# Patient Record
Sex: Female | Born: 1935
Health system: Southern US, Community
[De-identification: ages and names within clinical notes are randomized; demographics above are authoritative.]

## PROBLEM LIST (undated history)

## (undated) DIAGNOSIS — K6289 Other specified diseases of anus and rectum: Secondary | ICD-10-CM

## (undated) DIAGNOSIS — K5730 Diverticulosis of large intestine without perforation or abscess without bleeding: Secondary | ICD-10-CM

## (undated) DIAGNOSIS — Z8781 Personal history of (healed) traumatic fracture: Secondary | ICD-10-CM

## (undated) DIAGNOSIS — D649 Anemia, unspecified: Secondary | ICD-10-CM

## (undated) DIAGNOSIS — I5032 Chronic diastolic (congestive) heart failure: Secondary | ICD-10-CM

## (undated) DIAGNOSIS — M8580 Other specified disorders of bone density and structure, unspecified site: Secondary | ICD-10-CM

## (undated) DIAGNOSIS — I447 Left bundle-branch block, unspecified: Secondary | ICD-10-CM

## (undated) DIAGNOSIS — I259 Chronic ischemic heart disease, unspecified: Secondary | ICD-10-CM

## (undated) DIAGNOSIS — I10 Essential (primary) hypertension: Secondary | ICD-10-CM

## (undated) DIAGNOSIS — K5901 Slow transit constipation: Secondary | ICD-10-CM

## (undated) DIAGNOSIS — K648 Other hemorrhoids: Secondary | ICD-10-CM

## (undated) DIAGNOSIS — I5020 Unspecified systolic (congestive) heart failure: Secondary | ICD-10-CM

## (undated) DIAGNOSIS — E8809 Other disorders of plasma-protein metabolism, not elsewhere classified: Secondary | ICD-10-CM

## (undated) DIAGNOSIS — I2510 Atherosclerotic heart disease of native coronary artery without angina pectoris: Secondary | ICD-10-CM

## (undated) DIAGNOSIS — N289 Disorder of kidney and ureter, unspecified: Secondary | ICD-10-CM

## (undated) DIAGNOSIS — F32A Depression, unspecified: Secondary | ICD-10-CM

## (undated) DIAGNOSIS — F329 Major depressive disorder, single episode, unspecified: Secondary | ICD-10-CM

## (undated) DIAGNOSIS — M1990 Unspecified osteoarthritis, unspecified site: Secondary | ICD-10-CM

## (undated) HISTORY — PX: ABDOMINAL HYSTERECTOMY: SHX81

## (undated) HISTORY — DX: Atherosclerotic heart disease of native coronary artery without angina pectoris: I25.10

## (undated) HISTORY — DX: Other specified disorders of bone density and structure, unspecified site: M85.80

## (undated) HISTORY — DX: Anemia, unspecified: D64.9

## (undated) HISTORY — DX: Unspecified systolic (congestive) heart failure: I50.20

## (undated) HISTORY — DX: Personal history of (healed) traumatic fracture: Z87.81

## (undated) HISTORY — PX: CATARACT EXTRACTION, BILATERAL: SHX1313

## (undated) HISTORY — DX: Slow transit constipation: K59.01

## (undated) HISTORY — DX: Other specified diseases of anus and rectum: K62.89

## (undated) HISTORY — DX: Disorder of kidney and ureter, unspecified: N28.9

## (undated) HISTORY — DX: Chronic diastolic (congestive) heart failure: I50.32

## (undated) HISTORY — DX: Left bundle-branch block, unspecified: I44.7

## (undated) HISTORY — DX: Other hemorrhoids: K64.8

## (undated) HISTORY — DX: Chronic ischemic heart disease, unspecified: I25.9

## (undated) HISTORY — DX: Other disorders of plasma-protein metabolism, not elsewhere classified: E88.09

## (undated) HISTORY — DX: Diverticulosis of large intestine without perforation or abscess without bleeding: K57.30

## (undated) HISTORY — PX: TONSILLECTOMY: SUR1361

## (undated) HISTORY — PX: CARDIAC CATHETERIZATION: SHX172

---

## 1998-05-06 ENCOUNTER — Encounter: Payer: Self-pay | Admitting: *Deleted

## 1998-05-06 ENCOUNTER — Observation Stay (HOSPITAL_COMMUNITY): Admission: EM | Admit: 1998-05-06 | Discharge: 1998-05-07 | Payer: Self-pay | Admitting: *Deleted

## 1998-07-27 ENCOUNTER — Observation Stay (HOSPITAL_COMMUNITY): Admission: RE | Admit: 1998-07-27 | Discharge: 1998-07-28 | Payer: Self-pay | Admitting: Obstetrics & Gynecology

## 1999-05-26 ENCOUNTER — Emergency Department (HOSPITAL_COMMUNITY): Admission: EM | Admit: 1999-05-26 | Discharge: 1999-05-26 | Payer: Self-pay | Admitting: Emergency Medicine

## 1999-09-16 ENCOUNTER — Other Ambulatory Visit: Admission: RE | Admit: 1999-09-16 | Discharge: 1999-09-16 | Payer: Self-pay | Admitting: Obstetrics & Gynecology

## 2000-07-01 ENCOUNTER — Ambulatory Visit (HOSPITAL_COMMUNITY): Admission: RE | Admit: 2000-07-01 | Discharge: 2000-07-01 | Payer: Self-pay | Admitting: *Deleted

## 2000-10-29 ENCOUNTER — Emergency Department (HOSPITAL_COMMUNITY): Admission: EM | Admit: 2000-10-29 | Discharge: 2000-10-29 | Payer: Self-pay | Admitting: Emergency Medicine

## 2000-10-30 ENCOUNTER — Emergency Department (HOSPITAL_COMMUNITY): Admission: EM | Admit: 2000-10-30 | Discharge: 2000-10-30 | Payer: Self-pay | Admitting: Emergency Medicine

## 2001-02-13 ENCOUNTER — Emergency Department (HOSPITAL_COMMUNITY): Admission: EM | Admit: 2001-02-13 | Discharge: 2001-02-13 | Payer: Self-pay | Admitting: Emergency Medicine

## 2001-02-13 ENCOUNTER — Encounter: Payer: Self-pay | Admitting: Emergency Medicine

## 2001-03-29 ENCOUNTER — Encounter: Payer: Self-pay | Admitting: Gastroenterology

## 2001-12-28 ENCOUNTER — Other Ambulatory Visit: Admission: RE | Admit: 2001-12-28 | Discharge: 2001-12-28 | Payer: Self-pay | Admitting: Obstetrics & Gynecology

## 2003-08-06 ENCOUNTER — Emergency Department (HOSPITAL_COMMUNITY): Admission: EM | Admit: 2003-08-06 | Discharge: 2003-08-06 | Payer: Self-pay | Admitting: Emergency Medicine

## 2004-01-30 ENCOUNTER — Other Ambulatory Visit: Admission: RE | Admit: 2004-01-30 | Discharge: 2004-01-30 | Payer: Self-pay | Admitting: Obstetrics & Gynecology

## 2004-03-12 ENCOUNTER — Emergency Department (HOSPITAL_COMMUNITY): Admission: EM | Admit: 2004-03-12 | Discharge: 2004-03-12 | Payer: Self-pay | Admitting: Emergency Medicine

## 2004-05-29 ENCOUNTER — Ambulatory Visit: Payer: Self-pay | Admitting: Internal Medicine

## 2004-07-30 ENCOUNTER — Ambulatory Visit: Payer: Self-pay | Admitting: Internal Medicine

## 2004-08-26 ENCOUNTER — Ambulatory Visit: Payer: Self-pay | Admitting: Internal Medicine

## 2004-09-10 ENCOUNTER — Emergency Department (HOSPITAL_COMMUNITY): Admission: EM | Admit: 2004-09-10 | Discharge: 2004-09-10 | Payer: Self-pay | Admitting: Emergency Medicine

## 2004-09-16 ENCOUNTER — Ambulatory Visit: Payer: Self-pay | Admitting: Internal Medicine

## 2004-10-08 ENCOUNTER — Ambulatory Visit: Payer: Self-pay | Admitting: Internal Medicine

## 2004-10-10 ENCOUNTER — Ambulatory Visit (HOSPITAL_COMMUNITY): Admission: RE | Admit: 2004-10-10 | Discharge: 2004-10-10 | Payer: Self-pay | Admitting: Internal Medicine

## 2004-11-04 ENCOUNTER — Ambulatory Visit: Payer: Self-pay | Admitting: Internal Medicine

## 2005-02-04 ENCOUNTER — Ambulatory Visit: Payer: Self-pay | Admitting: Internal Medicine

## 2005-06-03 ENCOUNTER — Ambulatory Visit: Payer: Self-pay | Admitting: Internal Medicine

## 2005-08-05 ENCOUNTER — Ambulatory Visit: Payer: Self-pay | Admitting: Internal Medicine

## 2010-05-28 ENCOUNTER — Encounter: Payer: Self-pay | Admitting: Gastroenterology

## 2010-05-29 ENCOUNTER — Encounter (INDEPENDENT_AMBULATORY_CARE_PROVIDER_SITE_OTHER): Payer: Self-pay | Admitting: *Deleted

## 2010-05-29 ENCOUNTER — Encounter: Admission: RE | Admit: 2010-05-29 | Discharge: 2010-05-29 | Payer: Self-pay | Admitting: Internal Medicine

## 2010-06-04 ENCOUNTER — Encounter: Payer: Self-pay | Admitting: Gastroenterology

## 2010-06-20 DIAGNOSIS — R809 Proteinuria, unspecified: Secondary | ICD-10-CM | POA: Insufficient documentation

## 2010-06-20 DIAGNOSIS — M949 Disorder of cartilage, unspecified: Secondary | ICD-10-CM

## 2010-06-20 DIAGNOSIS — R109 Unspecified abdominal pain: Secondary | ICD-10-CM | POA: Insufficient documentation

## 2010-06-20 DIAGNOSIS — H409 Unspecified glaucoma: Secondary | ICD-10-CM | POA: Insufficient documentation

## 2010-06-20 DIAGNOSIS — M899 Disorder of bone, unspecified: Secondary | ICD-10-CM | POA: Insufficient documentation

## 2010-06-20 DIAGNOSIS — J329 Chronic sinusitis, unspecified: Secondary | ICD-10-CM | POA: Insufficient documentation

## 2010-06-20 DIAGNOSIS — F411 Generalized anxiety disorder: Secondary | ICD-10-CM | POA: Insufficient documentation

## 2010-06-20 DIAGNOSIS — I10 Essential (primary) hypertension: Secondary | ICD-10-CM | POA: Insufficient documentation

## 2010-06-20 DIAGNOSIS — K219 Gastro-esophageal reflux disease without esophagitis: Secondary | ICD-10-CM | POA: Insufficient documentation

## 2010-06-20 DIAGNOSIS — H269 Unspecified cataract: Secondary | ICD-10-CM | POA: Insufficient documentation

## 2010-06-20 DIAGNOSIS — E559 Vitamin D deficiency, unspecified: Secondary | ICD-10-CM | POA: Insufficient documentation

## 2010-06-21 ENCOUNTER — Ambulatory Visit: Payer: Self-pay | Admitting: Gastroenterology

## 2010-06-21 DIAGNOSIS — K5901 Slow transit constipation: Secondary | ICD-10-CM | POA: Insufficient documentation

## 2010-06-21 DIAGNOSIS — K625 Hemorrhage of anus and rectum: Secondary | ICD-10-CM

## 2010-06-21 DIAGNOSIS — K5900 Constipation, unspecified: Secondary | ICD-10-CM | POA: Insufficient documentation

## 2010-06-24 ENCOUNTER — Ambulatory Visit: Payer: Self-pay | Admitting: Cardiovascular Disease

## 2010-06-24 ENCOUNTER — Encounter (INDEPENDENT_AMBULATORY_CARE_PROVIDER_SITE_OTHER): Payer: Self-pay | Admitting: *Deleted

## 2010-06-24 LAB — CONVERTED CEMR LAB
ALT: 19 U/L
AST: 26 U/L
Albumin: 3.8 g/dL
Alkaline Phosphatase: 44 U/L
Amylase: 92 U/L
BUN: 16 mg/dL
Basophils Absolute: 0.1 K/uL
Basophils Relative: 0.9 %
Bilirubin, Direct: 0 mg/dL
CO2: 31 meq/L
CRP, High Sensitivity: 6.55 — ABNORMAL HIGH
Calcium: 9.1 mg/dL
Chloride: 101 meq/L
Creatinine, Ser: 0.9 mg/dL
Eosinophils Absolute: 0.2 K/uL
Eosinophils Relative: 2.4 %
Ferritin: 43.6 ng/mL
Folate: 12.5 ng/mL
GFR calc non Af Amer: 67.42 mL/min
Glucose, Bld: 100 mg/dL — ABNORMAL HIGH
HCT: 37.8 %
Hemoglobin: 12.9 g/dL
Iron: 115 ug/dL
Lipase: 34 U/L
Lymphocytes Relative: 29.5 %
Lymphs Abs: 2.1 K/uL
MCHC: 34.2 g/dL
MCV: 89.7 fL
Monocytes Absolute: 0.5 K/uL
Monocytes Relative: 7.4 %
Neutro Abs: 4.2 K/uL
Neutrophils Relative %: 59.8 %
Platelets: 280 K/uL
Potassium: 3.9 meq/L
RBC: 4.21 M/uL
RDW: 12.5 %
Saturation Ratios: 22.5 %
Sed Rate: 19 mm/h
Sodium: 140 meq/L
TSH: 2.46 u[IU]/mL
Total Bilirubin: 0.7 mg/dL
Total Protein: 6.6 g/dL
Transferrin: 365.6 mg/dL — ABNORMAL HIGH
Vitamin B-12: 530 pg/mL
WBC: 7.1 10*3/microliter

## 2010-06-25 ENCOUNTER — Ambulatory Visit: Payer: Self-pay | Admitting: Gastroenterology

## 2010-06-25 LAB — CONVERTED CEMR LAB
Bilirubin Urine: NEGATIVE
Hemoglobin, Urine: NEGATIVE
Ketones, ur: NEGATIVE mg/dL
Total Protein, Urine: NEGATIVE mg/dL
Urine Glucose: NEGATIVE mg/dL
Urobilinogen, UA: 0.2 (ref 0.0–1.0)

## 2010-06-26 ENCOUNTER — Ambulatory Visit: Payer: Self-pay | Admitting: Gastroenterology

## 2010-06-26 DIAGNOSIS — K2990 Gastroduodenitis, unspecified, without bleeding: Secondary | ICD-10-CM

## 2010-06-26 DIAGNOSIS — K2970 Gastritis, unspecified, without bleeding: Secondary | ICD-10-CM | POA: Insufficient documentation

## 2010-06-26 LAB — CONVERTED CEMR LAB: UREASE: NEGATIVE

## 2010-07-01 ENCOUNTER — Encounter: Payer: Self-pay | Admitting: Gastroenterology

## 2010-07-09 ENCOUNTER — Ambulatory Visit: Payer: Self-pay | Admitting: Gastroenterology

## 2010-08-22 NOTE — Procedures (Signed)
Summary: Colonoscopy  Patient: Patricia Garcia Note: All result statuses are Final unless otherwise noted.  Tests: (1) Colonoscopy (COL)   COL Colonoscopy           DONE     [facility]     [address]     [place], [place]  [address]           COLONOSCOPY PROCEDURE REPORT           PATIENT:  Patricia Garcia, Patricia Garcia  MR#:  [id number]     BIRTHDATE:  11-Dec-1932, 77 yrs. old  GENDER:  female     ENDOSCOPIST:  Vania Rea. Jarold Motto, MD, [facility]     REF. BY:  Guerry Bruin, M.D.     PROCEDURE DATE:  06/26/2010     PROCEDURE:  Average-risk screening colonoscopy     G0121     ASA CLASS:  Class II     INDICATIONS:  Routine Risk Screening, Abdominal pain     MEDICATIONS:   Fentanyl 50 mcg IV, Versed 5 mg IV           DESCRIPTION OF PROCEDURE:   After the risks benefits and     alternatives of the procedure were thoroughly explained, informed     consent was obtained.  Digital rectal exam was performed and     revealed no abnormalities.   The LB 180AL [id number] endoscope was     introduced through the anus and advanced to the cecum, which was     identified by both the appendix and ileocecal valve, without     limitations.  The quality of the prep was excellent, using     MoviPrep.  The instrument was then slowly withdrawn as the colon     was fully examined.     <<PROCEDUREIMAGES>>           FINDINGS:  Moderate diverticulosis was found in the sigmoid to     descending colon segments.  No polyps or cancers were seen.     Retroflexed views in the rectum revealed no abnormalities.    The     scope was then withdrawn from the patient and the procedure     completed.           COMPLICATIONS:  None     ENDOSCOPIC IMPRESSION:     1) Moderate diverticulosis in the sigmoid to descending colon     segments     2) No polyps or cancers     probable adhesions.     RECOMMENDATIONS:     1) high fiber diet     2) Continue current colorectal screening recommendations for     "routine risk"  patients with a repeat colonoscopy in 10 years.     3) metamucil or benefiber     AMITIZA 8 MCGH BID.     REPEAT EXAM:  No           ______________________________     Vania Rea. Jarold Motto, MD, [facility]           CC:           n.     eSIGNED:   Vania Rea. Patterson at 06/26/2010 09:37 AM           Mechele Dawley, [id number]  Note: An exclamation mark (!) indicates a result that was not dispersed into the flowsheet. Document Creation Date: 06/26/2010 9:38 AM _______________________________________________________________________  (1) Order result status: Final Collection or observation date-time: 06/26/2010 09:21 Requested  date-time:  Receipt date-time:  Reported date-time:  Referring Physician:   Ordering Physician: Sheryn Bison [phone]) Specimen Source:  Source: Launa Grill Order Number: [id number] Lab site:   Appended Document: Colonoscopy    Clinical Lists Changes  Observations: Added new observation of COLONNXTDUE: 06/2020 (06/26/2010 13:36)      Appended Document: Colonoscopy     Procedures Next Due Date:    Colonoscopy: 06/2020

## 2010-08-22 NOTE — Miscellaneous (Signed)
Summary: [facility] previsit  Clinical Lists Changes  Medications: Added new medication of MOVIPREP 100 GM  SOLR (PEG-KCL-NACL-NASULF-NA ASC-C) As per prep instructions. - Signed Rx of MOVIPREP 100 GM  SOLR (PEG-KCL-NACL-NASULF-NA ASC-C) As per prep instructions.;  #1 x 0;  Signed;  Entered by: Karl Bales RN;  Authorized by: Mardella Layman MD [employer];  Method used: Electronically to [facility] [address], [place], [place]  [address], Ph: [phone], Fax: [phone] Observations: Added new observation of ALLERGY REV: Done (06/25/2010 12:54)    Prescriptions: MOVIPREP 100 GM  SOLR (PEG-KCL-NACL-NASULF-NA ASC-C) As per prep instructions.  #1 x 0   Entered by:   Karl Bales RN   Authorized by:   Mardella Layman MD [facility]   Signed by:   Karl Bales RN on 06/25/2010   Method used:   Electronically to        [facility]* (retail)       [address]       [place], [place]  [id number]       Ph: [phone]       Fax: [phone]   RxID:   [id number]   Appended Document: Colonoscopy    Clinical Lists Changes  Observations: Added new observation of COLONNXTDUE: 06/2020 (06/26/2010 13:36)

## 2010-08-22 NOTE — Procedures (Signed)
Summary: Colonoscopy   Colonoscopy  Procedure date:  03/29/2001  Findings:      Location:  [facility].   Patient Name: Patricia Garcia, Patricia Garcia MRN:  Procedure Procedures: Colonoscopy CPT: [address].  Personnel: Endoscopist: Vania Rea. Jarold Motto, MD.  Referred By: Sandrea Hughs, MD.  Exam Location: Exam performed in Outpatient Clinic. Outpatient  Patient Consent: Procedure, Alternatives, Risks and Benefits discussed, consent obtained, from patient.  Indications Symptoms: Hematochezia.  Surveillance of: Adenomatous Polyp(s).  History  Pre-Exam Physical: Performed Mar 29, 2001. Cardio-pulmonary exam, Rectal exam, Abdominal exam, Extremity exam, Mental status exam WNL.  Exam Exam: Extent of exam reached: Cecum, extent intended: Cecum.  The cecum was identified by appendiceal orifice and IC valve. Patient position: on left side. Duration of exam: 15 minutes. Colon retroflexion performed. Images taken. ASA Classification: II. Tolerance: excellent.  Monitoring: Pulse and BP monitoring, Oximetry used. Supplemental O2 given.  Colon Prep Used Golytely for colon prep. Prep results: excellent.  Sedation Meds: Patient assessed and found to be appropriate for moderate (conscious) sedation. Fentanyl 50 mcg. Versed 5 mg.  Instrument(s): CF 140L. Serial [id number]. Serial #0.  Findings - DIVERTICULOSIS: Descending Colon to Sigmoid Colon. Not bleeding. ICD9: Diverticulosis, Colon: 562.10.  - HEMORRHOIDS: Internal. Size: Small. Not bleeding. Not thrombosed. ICD9: Hemorrhoids, Internal: 455.0.   Assessment  Diagnoses: 562.10: Diverticulosis, Colon.  455.0: Hemorrhoids, Internal.   Comments: No polyps noted. Events  Unplanned Interventions: No intervention was required.  Plans Medication Plan: Continue current medications. Fiber supplements: Methylcellulose 1 tsp QAM, starting Mar 29, 2001   Patient Education: Patient given standard instructions for: High fiber diet.   Disposition: After procedure patient sent to recovery. After recovery patient sent home.  Scheduling/Referral: Follow-Up prn.    CC: Sandrea Hughs, MD  This report was created from the original endoscopy report, which was reviewed and signed by the above listed endoscopist.

## 2010-08-22 NOTE — Procedures (Signed)
Summary: Upper Endoscopy  Patient: Patricia Garcia Note: All result statuses are Final unless otherwise noted.  Tests: (1) Upper Endoscopy (EGD)   EGD Upper Endoscopy       DONE     [facility]     [address]     [place], [place]  [address]           ENDOSCOPY PROCEDURE REPORT           PATIENT:  Patricia Garcia, Patricia Garcia  MR#:  [id number]     BIRTHDATE:  10-20-1932, 77 yrs. old  GENDER:  female           ENDOSCOPIST:  Vania Rea. Jarold Motto, MD, [facility]     Referred by:  Guerry Bruin, M.D.           PROCEDURE DATE:  06/26/2010     PROCEDURE:  EGD with biopsy, [address]     ASA CLASS:  Class II     INDICATIONS:  GERD, abdominal pain           MEDICATIONS:   There was residual sedation effect present from     prior procedure., Fentanyl 25 mcg IV, Versed 2 mg IV     TOPICAL ANESTHETIC:  Exactacain Spray           DESCRIPTION OF PROCEDURE:   After the risks benefits and     alternatives of the procedure were thoroughly explained, informed     consent was obtained.  The [name] [id number] endoscope was     introduced through the mouth and advanced to the second portion of     the duodenum, without limitations.  The instrument was slowly     withdrawn as the mucosa was fully examined.     <<PROCEDUREIMAGES>>           Moderate gastritis was found. EROSIONS AND GRANULAR MUCOSA.CLO BX.     DONE.  irregular Z-line. BIOPSIES DONE.  The duodenal bulb was     normal in appearance, as was the postbulbar duodenum.  The stomach     was entered and closely examined. The antrum, angularis, and     lesser curvature were well visualized, including a retroflexed     view of the cardia and fundus. The stomach wall was normally     distensable. The scope passed easily through the pylorus into the     duodenum.    Retroflexed views revealed no abnormalities.    The     scope was then withdrawn from the patient and the procedure     completed.           COMPLICATIONS:  None           ENDOSCOPIC  IMPRESSION:     1) Moderate gastritis     2) Irregular Z-line     3) Normal duodenum     4) Normal stomach     1.R/O H.PYLORI     2.R/O BARRETT'S MUCOSA     3.CHRONIC GERD ON RX.     RECOMMENDATIONS:     1) Await biopsy results     2) Rx CLO if positive     3) continue PPI           REPEAT EXAM:  No           ______________________________     Vania Rea. Jarold Motto, MD, [facility]           CC:  n.     eSIGNED:   Vania Rea. Patterson at 06/26/2010 09:45 AM           Mechele Dawley, [id number]  Note: An exclamation mark (!) indicates a result that was not dispersed into the flowsheet. Document Creation Date: 06/26/2010 9:45 AM _______________________________________________________________________  (1) Order result status: Final Collection or observation date-time: 06/26/2010 09:32 Requested date-time:  Receipt date-time:  Reported date-time:  Referring Physician:   Ordering Physician: Sheryn Bison [phone]) Specimen Source:  Source: Launa Grill Order Number: [id number] Lab site:

## 2010-08-22 NOTE — Letter (Signed)
Summary: [facility] Instructions  [facility]  [address] [place], [place] [address]   Phone: [phone]  Fax: [phone]       Patricia Garcia    [birth year]    MRN: [id number]        Procedure Day Patricia Garcia: Wednesday 06-26-10     Arrival Time: 8:30 a.m.     Procedure Time: 9:30 a.m.     Location of Procedure:                    _x _  [facility] (4th Floor)  PREPARATION FOR COLONOSCOPY WITH MOVIPREP   Starting 5 days prior to your procedure  Today do not eat nuts, seeds, popcorn, corn, beans, peas,  salads, or any raw vegetables.  Do not take any fiber supplements (e.g. Metamucil, Citrucel, and Benefiber).  THE DAY BEFORE YOUR PROCEDURE         DATE:  06-25-10  DAY:  Tuesday   1.  Drink clear liquids the entire day-NO SOLID FOOD  2.  Do not drink anything colored red or purple.  Avoid juices with pulp.  No orange juice.  3.  Drink at least 64 oz. (8 glasses) of fluid/clear liquids during the day to prevent dehydration and help the prep work efficiently.  CLEAR LIQUIDS INCLUDE: Water Jello Ice Popsicles Tea (sugar ok, no milk/cream) Powdered fruit flavored drinks Coffee (sugar ok, no milk/cream) Gatorade Juice: apple, white grape, white cranberry  Lemonade Clear bullion, consomm, broth Carbonated beverages (any kind) Strained chicken noodle soup Hard Candy                             4.  In the morning, mix first dose of MoviPrep solution:    Empty 1 Pouch A and 1 Pouch B into the disposable container    Add lukewarm drinking water to the top line of the container. Mix to dissolve    Refrigerate (mixed solution should be used within 24 hrs)  5.  Begin drinking the prep at 5:00 p.m. The MoviPrep container is divided by 4 marks.   Every 15 minutes drink the solution down to the next mark (approximately 8 oz) until the full liter is complete.   6.  Follow completed prep with 16 oz of clear liquid of your choice (Nothing red or  purple).  Continue to drink clear liquids until bedtime.  7.  Before going to bed, mix second dose of MoviPrep solution:    Empty 1 Pouch A and 1 Pouch B into the disposable container    Add lukewarm drinking water to the top line of the container. Mix to dissolve    Refrigerate  THE DAY OF YOUR PROCEDURE      DATE:  06-26-10  DAY:  Wednesday  Beginning at  4:30 a.m. (5 hours before procedure):         1. Every 15 minutes, drink the solution down to the next mark (approx 8 oz) until the full liter is complete.  2. Follow completed prep with 16 oz. of clear liquid of your choice.    3. You may drink clear liquids until  7:30 a.m.  (2 HOURS BEFORE PROCEDURE).   MEDICATION INSTRUCTIONS  Unless otherwise instructed, you should take regular prescription medications with a small sip of water   as early as possible the morning of your procedure.  HOLD MAXZIDE before coming in for procedure tomorrow  OTHER INSTRUCTIONS  You will need a responsible adult at least 75 years of age to accompany you and drive you home.   This person must remain in the waiting room during your procedure.  Wear loose fitting clothing that is easily removed.  Leave jewelry and other valuables at home.  However, you may wish to bring a book to read or  an iPod/MP3 player to listen to music as you wait for your procedure to start.  Remove all body piercing jewelry and leave at home.  Total time from sign-in until discharge is approximately 2-3 hours.  You should go home directly after your procedure and rest.  You can resume normal activities the  day after your procedure.  The day of your procedure you should not:   Drive   Make legal decisions   Operate machinery   Drink alcohol   Return to work  You will receive specific instructions about eating, activities and medications before you leave.    The above instructions have been reviewed and explained to me by   Karl Bales  RN  June 25, 2010 1:31 PM    I fully understand and can verbalize these instructions _____________________________ Date _________

## 2010-08-22 NOTE — Assessment & Plan Note (Signed)
Summary: F/U FROM COLON.Patricia Garcia.    History of Present Illness Visit Type: Follow-up Visit Primary GI MD: Sheryn Bison MD FACP FAGA Primary Provider: Guerry Bruin, MD Requesting Provider: n/a Chief Complaint: Colonoscopy/Endoscopy follow up, pt states she has done good since her procedures History of Present Illness:   Patricia Garcia is currently asymptomatic on PPI therapy and fiber supplements with p.r.n. tramadol use. Her endoscopy showed NSAID-induced gastritis with negative H. pylori examination. Colonoscopy showed diverticulosis coli without other abnormalities. Review of her CT scan labs also were unremarkable.  She occasionally has right lower quadrant pain related to previous lysis of adhesions from pelvic inflammatory disease involving her colon. She was to restart her Aleve which she takes for degenerative arthritis. She is on calcium replacement and Metamucil capsules.   GI Review of Systems      Denies abdominal pain, acid reflux, belching, bloating, chest pain, dysphagia with liquids, dysphagia with solids, heartburn, loss of appetite, nausea, vomiting, vomiting blood, weight loss, and  weight gain.        Denies anal fissure, black tarry stools, change in bowel habit, constipation, diarrhea, diverticulosis, fecal incontinence, heme positive stool, hemorrhoids, irritable bowel syndrome, jaundice, light color stool, liver problems, rectal bleeding, and  rectal pain.    Current Medications (verified): 1)  Catapres 0.1 Mg Tabs (Clonidine Hcl) .... Take One By Mouth Three Times A Day 2)  Maxzide-25 37.5-25 Mg Tabs (Triamterene-Hctz) .... Take One By Mouth Once Daily 3)  Alprazolam 0.25 Mg Tabs (Alprazolam) .... Take 1/2 Tablet By Mouth At Bedtime or As Needed 4)  Premarin 0.625 Mg Tabs (Estrogens Conjugated) .... Take One By Mouth Once Daily 5)  Aspirin 81 Mg Tabs (Aspirin) .... Take 1 Tablet By Mouth Once A Day 6)  Vitamin E 400 Unit Caps (Vitamin E) .... Take One By Mouth Once  Daily 7)  Centrum Silver Ultra Womens  Tabs (Multiple Vitamins-Minerals) .... Take 1 Tablet By Mouth Once A Day 8)  Latanoprost 0.005 % Soln (Latanoprost) .... One Drop in Each Eye At Bedtime 9)  Flonase 50 Mcg/act Susp (Fluticasone Propionate) .... One Spray Each Nostril Two Times A Day As Needed 10)  Oscal 500/200 D-3 500-200 Mg-Unit Tabs (Calcium Carbonate-Vitamin D) .... Take One By Mouth Once Daily 11)  Vitamin D3 1000 Unit Tabs (Cholecalciferol) .... Take One  By Mouth Once Daily 12)  Fish Oil 1000 Mg Caps (Omega-3 Fatty Acids) .... Take One By Mouth Once Daily 13)  Dulcolax 5 Mg Tbec (Bisacodyl) .... Take 1 Tab By Mouth At Bedtime As Needed 14)  Aleve 220 Mg Tabs (Naproxen Sodium) .... Take 1 Tablet By Mouth Once A Day For Inflammation As Needed 15)  Tramadol Hcl 50 Mg Tabs (Tramadol Hcl) .... Take One By Mouth Every 6-8 Hours As Needed 16)  Metamucil 0.52 Gm Caps (Psyllium) .... 2 By Mouth Once Daily  Allergies (verified): 1)  Pcn 2)  Sulfa  Past History:  Family History: Last updated: 06/21/2010 Family History of Colon Cancer: Father, dx'd at 58yo Family History of Diabetes: Mother (deceased at 75yo) Family History of Heart Disease: Mother Family History of Kidney Disease: Mother  Social History: Last updated: 06/21/2010 Widow, 1 boy, 1 girl Homemaker Illicit Drug Use - no Patient gets regular exercise. Patient is a former smoker.   Past medical, surgical, family and social histories (including risk factors) reviewed for relevance to current acute and chronic problems.  Past Medical History: Reviewed history from 06/20/2010 and no changes required. Current Problems:  HYPERTENSION,  BENIGN (ICD-401.1) ABDOMINAL PAIN (ICD-789.00) CATARACTS (ICD-366.9) GLAUCOMA (ICD-365.9) HORMONE REPLACEMENT THERAPY (ICD-V07.4) VITAMIN D DEFICIENCY (ICD-268.9) OSTEOPENIA (ICD-733.90) GERD (ICD-530.81) SINUSITIS, RECURRENT (ICD-473.9) MICROALBUMINURIA (ICD-791.0) ANXIETY  (ICD-300.00)  Past Surgical History: Reviewed history from 06/21/2010 and no changes required. Hysterectomy 1977 Right Oophorectomy 2001 Appendectomy  Family History: Reviewed history from 06/21/2010 and no changes required. Family History of Colon Cancer: Father, dx'd at 38yo Family History of Diabetes: Mother (deceased at 57yo) Family History of Heart Disease: Mother Family History of Kidney Disease: Mother  Social History: Reviewed history from 06/21/2010 and no changes required. Widow, 1 boy, 1 girl Homemaker Illicit Drug Use - no Patient gets regular exercise. Patient is a former smoker.   Review of Systems  The patient denies allergy/sinus, anemia, anxiety-new, arthritis/joint pain, back pain, blood in urine, breast changes/lumps, change in vision, confusion, cough, coughing up blood, depression-new, fainting, fatigue, fever, headaches-new, hearing problems, heart murmur, heart rhythm changes, itching, menstrual pain, muscle pains/cramps, night sweats, nosebleeds, pregnancy symptoms, shortness of breath, skin rash, sleeping problems, sore throat, swelling of feet/legs, swollen lymph glands, thirst - excessive , urination - excessive , urination changes/pain, urine leakage, vision changes, and voice change.    Vital Signs:  Patient profile:   77 year old female Height:      63.5 inches Weight:      136 pounds BMI:     23.80 BSA:     1.65 Pulse rate:   80 / minute Pulse rhythm:   regular BP sitting:   132 / 76  (left arm)  Vitals Entered By: Merri Ray CMA Duncan Dull) (July 09, 2010 10:50 AM)  Physical Exam  General:  Well developed, well nourished, no acute distress.healthy appearing.   Head:  Normocephalic and atraumatic. Eyes:  PERRLA, no icterus.exam deferred to patient's ophthalmologist.   Abdomen:  Soft, nontender and nondistended. No masses, hepatosplenomegaly or hernias noted. Normal bowel sounds. Neurologic:  Alert and  oriented x4;  grossly normal  neurologically. Psych:  Alert and cooperative. Normal mood and affect.   Impression & Recommendations:  Problem # 1:  GASTRITIS (ICD-535.50) Assessment Improved Have Prescribed omeprazole 20 mg a day which I think she should take chronically per her NSAID use and recurrent gastritis. Gallbladder examinations have been negative.  Problem # 2:  CONSTIPATION (ICD-564.00) Assessment: Improved Continue high-fiber diet, Metamucil, and liberal p.o. fluids with p.r.n. Dulcolax  Problem # 3:  RECTAL BLEEDING (ICD-569.3) Assessment: Improved hemorrhoidal bleeding which has resolved.  Problem # 4:  ABDOMINAL PAIN (ICD-789.00) Assessment: Improved p.r.n. sublingual Levsin prescribed.  Patient Instructions: 1)  Please pick up your prescriptions at the pharmacy. Electronic prescription(s) has already been sent for Levsin. You should take this every 8 hours as needed. We have also sent a prescription for omeprazole to your pharmacy for you to take 1 tablet daily. 2)  Copy sent to : Dr R. Tisovec 3)  The medication list was reviewed and reconciled.  All changed / newly prescribed medications were explained.  A complete medication list was provided to the patient / caregiver. 4)  Please schedule a follow-up appointment as needed.  5)  High Fiber, Low Fat  Healthy Eating Plan brochure given.  Prescriptions: LEVSIN 0.125 MG TABS (HYOSCYAMINE SULFATE) Take 1 tablet by mouth as needed  #30 x 1   Entered by:   Lamona Curl CMA (AAMA)   Authorized by:   Mardella Layman MD [facility]   Signed by:   Lamona Curl CMA (AAMA) on 07/09/2010   Method used:  Electronically to        [facility]* (retail)       [address]       [place], [place]  [address]       Ph: [phone]       Fax: [phone]   RxID:   [id number] OMEPRAZOLE 20 MG CPDR (OMEPRAZOLE) Take 1 tablet by mouth once daily  #30 x 3   Entered by:   Lamona Curl CMA (AAMA)   Authorized by:   Mardella Layman MD [facility]   Signed by:   Lamona Curl CMA (AAMA) on 07/09/2010   Method used:   Electronically to        [facility]* (retail)       [address]       [place], [place]  [id number]       Ph: [phone]       Fax: [phone]   RxID:   [id number]

## 2010-08-22 NOTE — Assessment & Plan Note (Signed)
Summary: STOMACH PAIN/YF   History of Present Illness Visit Type: consult Primary GI MD: Sheryn Bison MD FACP FAGA Primary Provider: Guerry Bruin, MD Requesting Provider: Guerry Bruin, MD Chief Complaint: abdominal pain that begins mid-abdomen and radiates to right side History of Present Illness:   Very Pleasant 77 year old Caucasian Patricia Garcia referred through the courtesy of Dr.Tisovec for evaluation of worsening lower abdominal pain over the last 2 months with one episode of significant hematochezia 2 weeks ago.  [name] had colonoscopy 10 years ago which showed diverticulosis but otherwise was unremarkable. She has had no GI complaints except for chronic constipation until 2 months ago when she developed diffuse lower abdominal discomfort, worsening constipation, and one episode of rectal bleeding. Her symptoms were exacerbated by a trial of probiotics therapy. Her pain is worse with movement, eating, and constipation. She does have a history of previous rectal fissure treated by Dr. Varney Baas  in gynecology. Currently, the patient is on a low fiber diet but does use liberal p.o. fluids. She has chronic indigestion and acid reflux for which she takes daily Prevacid. She denies dysphagia, weight loss, or any specific hepatobiliary complaints. Recent upper normal ultrasound was unremarkable although the pancreas was poorly visualized.   GI Review of Systems    Reports abdominal pain, acid reflux, belching, bloating, and  heartburn.     Location of  Abdominal pain: generalized.    Denies chest pain, dysphagia with liquids, dysphagia with solids, loss of appetite, nausea, vomiting, vomiting blood, weight loss, and  weight gain.      Reports constipation, diverticulosis, and  hemorrhoids.     Denies anal fissure, black tarry stools, change in bowel habit, diarrhea, fecal incontinence, heme positive stool, irritable bowel syndrome, jaundice, light color stool, liver problems, rectal  bleeding, and  rectal pain. Preventive Screening-Counseling & Management  Alcohol-Tobacco     Smoking Status: quit    Current Medications (verified): 1)  Catapres 0.1 Mg Tabs (Clonidine Hcl) .... Take One By Mouth Three Times A Day 2)  Maxzide-25 37.5-25 Mg Tabs (Triamterene-Hctz) .... Take One By Mouth Once Daily 3)  Alprazolam 0.25 Mg Tabs (Alprazolam) .... Take 1/2 Tablet By Mouth At Bedtime or As Needed 4)  Premarin 0.625 Mg Tabs (Estrogens Conjugated) .... Take One By Mouth Once Daily 5)  Aspirin 81 Mg Tabs (Aspirin) .... Take 1 Tablet By Mouth Once A Day 6)  Vitamin E 400 Unit Caps (Vitamin E) .... Take One By Mouth Once Daily 7)  Centrum Silver Ultra Womens  Tabs (Multiple Vitamins-Minerals) .... Take 1 Tablet By Mouth Once A Day 8)  Pepcid Complete 10-800-165 Mg Chew (Famotidine-Ca Carb-Mag Hydrox) .... Take One By Mouth As Needed Otc 9)  Latanoprost 0.005 % Soln (Latanoprost) .... One Drop in Each Eye At Bedtime 10)  Flonase 50 Mcg/act Susp (Fluticasone Propionate) .... One Spray Each Nostril Two Times A Day As Needed 11)  Oscal 500/200 D-3 500-200 Mg-Unit Tabs (Calcium Carbonate-Vitamin D) .... Take One By Mouth Once Daily 12)  Vitamin D3 1000 Unit Tabs (Cholecalciferol) .... Take One  By Mouth Once Daily 13)  Fish Oil 1000 Mg Caps (Omega-3 Fatty Acids) .... Take One By Mouth Once Daily 14)  Dulcolax 5 Mg Tbec (Bisacodyl) .... Take 1 Tab By Mouth At Bedtime As Needed 15)  Aleve 220 Mg Tabs (Naproxen Sodium) .... Take 1 Tablet By Mouth Once A Day For Inflammation As Needed  Allergies (verified): 1)  Pcn 2)  Sulfa  Past History:  Past medical,  surgical, family and social histories (including risk factors) reviewed for relevance to current acute and chronic problems.  Past Medical History: Reviewed history from 06/20/2010 and no changes required. Current Problems:  HYPERTENSION, BENIGN (ICD-401.1) ABDOMINAL PAIN (ICD-789.00) CATARACTS (ICD-366.9) GLAUCOMA  (ICD-365.9) HORMONE REPLACEMENT THERAPY (ICD-V07.4) VITAMIN D DEFICIENCY (ICD-268.9) OSTEOPENIA (ICD-733.90) GERD (ICD-530.81) SINUSITIS, RECURRENT (ICD-473.9) MICROALBUMINURIA (ICD-791.0) ANXIETY (ICD-300.00)  Past Surgical History: Hysterectomy 1977 Right Oophorectomy 2001 Appendectomy  Family History: Reviewed history and no changes required. Family History of Colon Cancer: Father, dx'd at 19yo Family History of Diabetes: Mother (deceased at 58yo) Family History of Heart Disease: Mother Family History of Kidney Disease: Mother  Social History: Reviewed history from 06/20/2010 and no changes required. Widow, 1 boy, 1 girl Homemaker Illicit Drug Use - no Patient gets regular exercise. Patient is a former smoker.  Smoking Status:  quit  Review of Systems       The patient complains of arthritis/joint pain, back pain, urine leakage, and vision changes.  The patient denies allergy/sinus, anemia, anxiety-new, blood in urine, breast changes/lumps, confusion, cough, coughing up blood, depression-new, fainting, fatigue, fever, headaches-new, hearing problems, heart murmur, heart rhythm changes, itching, menstrual pain, muscle pains/cramps, night sweats, nosebleeds, pregnancy symptoms, shortness of breath, skin rash, sleeping problems, sore throat, swelling of feet/legs, swollen lymph glands, thirst - excessive, urination - excessive, urination changes/pain, and voice change.    Vital Signs:  Patient profile:   77 year old Patricia Garcia Height:      63.5 inches Weight:      138 pounds BMI:     24.15 Pulse rate:   76 / minute Pulse rhythm:   regular BP sitting:   140 / 78  (left arm) Cuff size:   regular  Vitals Entered By: Francee Piccolo CMA Duncan Dull) (June 21, 2010 10:05 AM)  Physical Exam  General:  Well developed, well nourished, no acute distress.healthy appearing.   Head:  Normocephalic and atraumatic. Eyes:  PERRLA, no icterus.exam deferred to patient's  ophthalmologist.   Neck:  Supple; no masses or thyromegaly. Lungs:  Clear throughout to auscultation. Heart:  Regular rate and rhythm; no murmurs, rubs,  or bruits. Abdomen:  her abdomen appears mildly distended. I cannot appreciate definite organomegaly or masses. She is tender in both lower quadrants without rebound. Bowel sounds are nonobstructive. Rectal:  Normal exam.hemocult negative.   Extremities:  No clubbing, cyanosis, edema or deformities noted. Neurologic:  Alert and  oriented x4;  grossly normal neurologically. Cervical Nodes:  No significant cervical adenopathy. Psych:  Alert and cooperative. Normal mood and affect.   Impression & Recommendations:  Problem # 1:  ABDOMINAL PAIN (ICD-789.00) Assessment Deteriorated Her symptoms and lower abdominal tenderness suggest possible low grade subacute diverticulitis or perhaps other inflammatory processes. She did have removal of her right ovary and appendix in 2001 laparoscopically. She has not had colonoscopy in 10 years and has worsening constipation--rule out partial colonic obstruction. We Will Check screening labs, inflammatory markers, and CT scan of the abdomen and pelvis with probable colonoscopy depending on these results. I placed her on Cipro 500 mg twice a day for 10 days, trial of Amitiza 8 micrograms twice a day, and tramadol 50 mg every 6-8 hours as needed. TLB-CBC Platelet - w/Differential (85025-CBCD) TLB-BMP (Basic Metabolic Panel-BMET) (80048-METABOL) TLB-Hepatic/Liver Function Pnl (80076-HEPATIC) TLB-TSH (Thyroid Stimulating Hormone) (84443-TSH) TLB-B12, Serum-Total ONLY (98119-J47) TLB-Ferritin (82728-FER) TLB-Folic Acid (Folate) (82746-FOL) TLB-IBC Pnl (Iron/FE;Transferrin) (83550-IBC) TLB-CRP-High Sensitivity (C-Reactive Protein) (86140-FCRP) TLB-Sedimentation Rate (ESR) (85652-ESR) CT Abdomen/Pelvis with Contrast (CT Abd/Pelvis  w/con)  Problem # 2:  RECTAL BLEEDING (ICD-569.3) Assessment: Improved Worrisome  for colon carcinoma, possible episode of ischemic colitis, or diverticular hemorrhage. Orders: TLB-CBC Platelet - w/Differential (85025-CBCD) TLB-BMP (Basic Metabolic Panel-BMET) (80048-METABOL) TLB-Hepatic/Liver Function Pnl (80076-HEPATIC) TLB-TSH (Thyroid Stimulating Hormone) (84443-TSH) TLB-B12, Serum-Total ONLY (09811-B14) TLB-Ferritin (82728-FER) TLB-Folic Acid (Folate) (82746-FOL) TLB-IBC Pnl (Iron/FE;Transferrin) (83550-IBC) TLB-CRP-High Sensitivity (C-Reactive Protein) (86140-FCRP) TLB-Sedimentation Rate (ESR) (85652-ESR)  Problem # 3:  HYPERTENSION, BENIGN (ICD-401.1) Assessment: Improved blood pressure today 140/78 and she is to continue other medications per primary care.  Patient Instructions: 1)  Copy sent to : Guerry Bruin, MD 2)  Please go to the basement today for your labs.  3)  Your prescription(s) have been sent to you pharmacy.  4)  Your CT Scan in scheduled for 06/24/2010, please follow the seperate instructions.  5)  The medication list was reviewed and reconciled.  All changed / newly prescribed medications were explained.  A complete medication list was provided to the patient / caregiver. 6)  depending on CT scan results, colonoscopy will probably be indicated. Prescriptions: AMITIZA 8 MCG CAPS (LUBIPROSTONE) take one by mouth two times a day with food  #60 x 2   Entered by:   Harlow Mares CMA (AAMA)   Authorized by:   Mardella Layman MD [facility]   Signed by:   Harlow Mares CMA (AAMA) on 06/21/2010   Method used:   Faxed to ...       [facility]* (retail)       [address]       [place], [place]  [id number]       Ph: [phone]       Fax: [phone]   RxID:   [id number] TRAMADOL HCL 50 MG TABS (TRAMADOL HCL) take one by mouth every 6-8 hours as needed  #60 x 0   Entered by:   Harlow Mares CMA (AAMA)   Authorized by:   Mardella Layman MD [facility]   Signed by:   Harlow Mares CMA (AAMA) on 06/21/2010   Method used:   Faxed to  ...       [facility]* (retail)       [address]       [place], [place]  [id number]       Ph: [phone]       Fax: [phone]   RxID:   [id number] CIPROFLOXACIN HCL 500 MG TABS (CIPROFLOXACIN HCL) take one by mouth two times a day for 10 days  #20 x 0   Entered by:   Harlow Mares CMA (AAMA)   Authorized by:   Mardella Layman MD [facility]   Signed by:   Harlow Mares CMA (AAMA) on 06/21/2010   Method used:   Electronically to        [facility]* (retail)       [address]       [place], [place]  [id number]       Ph: [phone]       Fax: [phone]   RxID:   [id number]

## 2010-08-22 NOTE — Letter (Signed)
Summary: New Patient letter  [facility]  [address] [place], [place] [address]   Phone: [phone]  Fax: [phone]       06/04/2010 MRN: [id number]  Patricia Garcia [address] [place], [place]  [address]  Dear Patricia Garcia,  Welcome to the Gastroenterology Division at [facility].    You are scheduled to see Dr.  Jarold Motto on 07-11-10 at 9:30am on the 3rd floor at [facility], [address]  We ask that you try to arrive at our office 15 minutes prior to your appointment time to allow for check-in.  We would like you to complete the enclosed self-administered evaluation form prior to your visit and bring it with you on the day of your appointment.  We will review it with you.  Also, please bring a complete list of all your medications or, if you prefer, bring the medication bottles and we will list them.  Please bring your insurance card so that we may make a copy of it.  If your insurance requires a referral to see a specialist, please bring your referral form from your primary care physician.  Co-payments are due at the time of your visit and may be paid by cash, check or credit card.     Your office visit will consist of a consult with your physician (includes a physical exam), any laboratory testing he/she may order, scheduling of any necessary diagnostic testing (e.g. x-ray, ultrasound, CT-scan), and scheduling of a procedure (e.g. Endoscopy, Colonoscopy) if required.  Please allow enough time on your schedule to allow for any/all of these possibilities.    If you cannot keep your appointment, please call [phone] to cancel or reschedule prior to your appointment date.  This allows [place] the opportunity to schedule an appointment for another patient in need of care.  If you do not cancel or reschedule by 5 p.m. the business day prior to your appointment date, you will be charged a $50.00 late cancellation/no-show fee.    Thank you for choosing  [facility] for your medical needs.  We appreciate the opportunity to care for you.  Please visit [place] at our website  to learn more about our practice.                     Sincerely,                                                             The Gastroenterology Division

## 2010-08-22 NOTE — Letter (Signed)
Summary: Patient [facility] Biopsy Results  [facility]  [address] [place], [place] [address]   Phone: [phone]  Fax: [phone]        July 01, 2010 MRN: [id number]    [facility] [address] [place], [place]  [address]    Dear Ms. Cotterill,  I am pleased to inform you that the biopsies taken during your recent endoscopic examination did not show any evidence of cancer upon pathologic examination.  Additional information/recommendations:  __No further action is needed at this time.  Please follow-up with      your primary care physician for your other healthcare needs.  __ Please call [phone] to schedule a return visit to review      your condition.  XX__ Continue with the treatment plan as outlined on the day of your      exam.  __ You should have a repeat endoscopic examination for this problem              in _ months/years.   Please call us if you are having persistent problems or have questions about your condition that have not been fully answered at this time.  Sincerely,  Mardella Layman MD [facility]  This letter has been electronically signed by your physician.  Appended Document: Patient Notice-Endo Biopsy Results Letter mailed

## 2010-08-22 NOTE — Miscellaneous (Signed)
Summary: Orders Update clotest  Clinical Lists Changes  Problems: Added new problem of GASTRITIS (ICD-535.50) Orders: Added new Test order of TLB-H Pylori Screen Gastric Biopsy (83013-CLOTEST) - Signed

## 2010-08-22 NOTE — Letter (Signed)
Summary: [facility]  [facility]   Imported By: Lennie Odor 06/26/2010 11:09:40  _____________________________________________________________________  External Attachment:    Type:   Image     Comment:   External Document

## 2010-12-06 ENCOUNTER — Emergency Department (HOSPITAL_COMMUNITY)
Admission: EM | Admit: 2010-12-06 | Discharge: 2010-12-06 | Disposition: A | Discharge status: Home / Self Care | Payer: No Typology Code available for payment source | Attending: General Surgery | Admitting: General Surgery

## 2010-12-06 ENCOUNTER — Emergency Department (HOSPITAL_COMMUNITY): Payer: No Typology Code available for payment source

## 2010-12-06 DIAGNOSIS — IMO0002 Reserved for concepts with insufficient information to code with codable children: Secondary | ICD-10-CM | POA: Insufficient documentation

## 2010-12-06 DIAGNOSIS — Y9289 Other specified places as the place of occurrence of the external cause: Secondary | ICD-10-CM | POA: Insufficient documentation

## 2010-12-06 DIAGNOSIS — S300XXA Contusion of lower back and pelvis, initial encounter: Secondary | ICD-10-CM | POA: Insufficient documentation

## 2010-12-06 DIAGNOSIS — I10 Essential (primary) hypertension: Secondary | ICD-10-CM | POA: Insufficient documentation

## 2010-12-06 NOTE — Cardiovascular Report (Signed)
[place]  Patient:    MIKERIA, VALIN                     MRN: [id number] Proc. Date: 07/01/00 Adm. Date:  [id number] Attending:  Daisey Must CC:         Charlaine Dalton. Sherene Sires, M.D. [facility]  Cardiac Catheterization Laboratory   Cardiac Catheterization  PROCEDURES PERFORMED:  Left heart catheterization with coronary angiography and left ventriculography.  INDICATIONS:  Ms. Koziel is a 77 year old who has been having exertional chest discomfort.  An exercise treadmill test was notable for development of horizontal ST segment depression during the recovery phase associated with chest pain.  We opted therefore to proceed with cardiac catheterization.  DESCRIPTION OF PROCEDURE:  A 6 French sheath was placed in the right femoral artery.  Standard Judkins 6 French catheters were utilized.  Contrast was Omnipaque.  Intracoronary nitroglycerin was administered both in the left and the right coronary arteries to relieve potential vaso spasm.  There were no complications.  RESULTS:  HEMODYNAMICS:  Left ventricular pressure 158/18.  Aortic pressure 168/80.  No aortic valve gradient.  LEFT VENTRICULOGRAM:  Wall motion is normal.  Ejection fraction is calculated at 73%.  There is 1+ mild mitral regurgitation.  CORONARY ARTERIOGRAPHY:  (Right dominant).  Left main:  Normal.  Left anterior descending:  The left anterior descending artery gives rise to a normal sized first diagonal branch.  The first diagonal has a 30% stenosis. The LAD itself has only minor luminal irregularities.  Left circumflex:  The left circumflex has a 40% stenosis in the mid vessel, 25% in the distal vessel.  The circumflex gives rise to a normal sized branching ramus intermedius which is a 30% stenosis.  There are three obtuse marginal branches.  Right coronary artery:  The right coronary artery has a 40% stenosis at its origin which did not change significantly with  intracoronary nitroglycerin. There is a 20% stenosis in the proximal vessel.  The distal right coronary artery gives rise to a normal sized posterior descending artery and two small posterolateral branches.  IMPRESSIONS: 1. Normal left ventricular systolic function with mild mitral regurgitation    which may be partially catheter-induced. 2. Nonobstructive coronary artery disease. DD:  07/01/00 TD:  07/01/00 Job: [id number]

## 2011-06-03 ENCOUNTER — Other Ambulatory Visit: Payer: Self-pay | Admitting: Orthopedic Surgery

## 2011-06-06 ENCOUNTER — Encounter (HOSPITAL_BASED_OUTPATIENT_CLINIC_OR_DEPARTMENT_OTHER): Payer: Self-pay | Admitting: *Deleted

## 2011-06-06 ENCOUNTER — Other Ambulatory Visit: Payer: Self-pay

## 2011-06-06 ENCOUNTER — Encounter (HOSPITAL_BASED_OUTPATIENT_CLINIC_OR_DEPARTMENT_OTHER)
Admission: RE | Admit: 2011-06-06 | Discharge: 2011-06-06 | Disposition: A | Discharge status: Home / Self Care | Payer: Medicare Other | Source: Ambulatory Visit | Attending: Orthopedic Surgery | Admitting: Orthopedic Surgery

## 2011-06-06 LAB — BASIC METABOLIC PANEL
CO2: 30 mEq/L (ref 19–32)
Glucose, Bld: 87 mg/dL (ref 70–99)
Potassium: 3.7 mEq/L (ref 3.5–5.1)
Sodium: 135 mEq/L (ref 135–145)

## 2011-06-10 ENCOUNTER — Ambulatory Visit (HOSPITAL_BASED_OUTPATIENT_CLINIC_OR_DEPARTMENT_OTHER): Admit: 2011-06-10 | Payer: Self-pay | Admitting: Orthopedic Surgery

## 2011-06-10 ENCOUNTER — Encounter (HOSPITAL_BASED_OUTPATIENT_CLINIC_OR_DEPARTMENT_OTHER): Payer: Self-pay | Admitting: Anesthesiology

## 2011-06-10 ENCOUNTER — Encounter (HOSPITAL_BASED_OUTPATIENT_CLINIC_OR_DEPARTMENT_OTHER): Payer: Self-pay

## 2011-06-10 ENCOUNTER — Encounter (HOSPITAL_BASED_OUTPATIENT_CLINIC_OR_DEPARTMENT_OTHER): Payer: Self-pay | Admitting: *Deleted

## 2011-06-10 ENCOUNTER — Ambulatory Visit (HOSPITAL_BASED_OUTPATIENT_CLINIC_OR_DEPARTMENT_OTHER)
Admission: RE | Admit: 2011-06-10 | Discharge: 2011-06-10 | Disposition: A | Discharge status: Home / Self Care | Payer: Medicare Other | Source: Ambulatory Visit | Attending: Orthopedic Surgery | Admitting: Orthopedic Surgery

## 2011-06-10 ENCOUNTER — Encounter (HOSPITAL_BASED_OUTPATIENT_CLINIC_OR_DEPARTMENT_OTHER)
Admission: RE | Disposition: A | Discharge status: Home / Self Care | Payer: Self-pay | Source: Ambulatory Visit | Attending: Orthopedic Surgery

## 2011-06-10 ENCOUNTER — Ambulatory Visit (HOSPITAL_BASED_OUTPATIENT_CLINIC_OR_DEPARTMENT_OTHER): Payer: Medicare Other | Admitting: Anesthesiology

## 2011-06-10 ENCOUNTER — Other Ambulatory Visit: Payer: Self-pay | Admitting: Orthopedic Surgery

## 2011-06-10 DIAGNOSIS — K219 Gastro-esophageal reflux disease without esophagitis: Secondary | ICD-10-CM | POA: Insufficient documentation

## 2011-06-10 DIAGNOSIS — I10 Essential (primary) hypertension: Secondary | ICD-10-CM | POA: Insufficient documentation

## 2011-06-10 DIAGNOSIS — Z01810 Encounter for preprocedural cardiovascular examination: Secondary | ICD-10-CM | POA: Insufficient documentation

## 2011-06-10 DIAGNOSIS — Z01812 Encounter for preprocedural laboratory examination: Secondary | ICD-10-CM | POA: Insufficient documentation

## 2011-06-10 DIAGNOSIS — E669 Obesity, unspecified: Secondary | ICD-10-CM | POA: Insufficient documentation

## 2011-06-10 DIAGNOSIS — D1610 Benign neoplasm of short bones of unspecified upper limb: Secondary | ICD-10-CM | POA: Insufficient documentation

## 2011-06-10 HISTORY — DX: Depression, unspecified: F32.A

## 2011-06-10 HISTORY — DX: Unspecified osteoarthritis, unspecified site: M19.90

## 2011-06-10 HISTORY — DX: Essential (primary) hypertension: I10

## 2011-06-10 HISTORY — DX: Major depressive disorder, single episode, unspecified: F32.9

## 2011-06-10 HISTORY — PX: MASS EXCISION: SHX2000

## 2011-06-10 SURGERY — EXCISION MASS
Anesthesia: Regional | Laterality: Right

## 2011-06-10 SURGERY — EXCISION MASS
Anesthesia: Regional | Site: Thumb | Laterality: Right | Wound class: Clean

## 2011-06-10 MED ORDER — ONDANSETRON HCL 4 MG/2ML IJ SOLN
INTRAMUSCULAR | Status: DC | PRN
  Administered 2011-06-10: 4 mg via INTRAVENOUS

## 2011-06-10 MED ORDER — CHLORHEXIDINE GLUCONATE 4 % EX LIQD: 60.0000 mL | Freq: Once | CUTANEOUS | Status: DC

## 2011-06-10 MED ORDER — LIDOCAINE HCL (CARDIAC) 20 MG/ML IV SOLN
INTRAVENOUS | Status: DC | PRN
  Administered 2011-06-10: 50 mg via INTRAVENOUS

## 2011-06-10 MED ORDER — PROPOFOL 10 MG/ML IV EMUL
INTRAVENOUS | Status: DC | PRN
  Administered 2011-06-10: 50 ug/kg/min via INTRAVENOUS

## 2011-06-10 MED ORDER — FENTANYL CITRATE 0.05 MG/ML IJ SOLN
INTRAMUSCULAR | Status: DC | PRN
  Administered 2011-06-10: 25 ug via INTRAVENOUS
  Administered 2011-06-10: 50 ug via INTRAVENOUS

## 2011-06-10 MED ORDER — LIDOCAINE HCL (PF) 0.5 % IJ SOLN
INTRAMUSCULAR | Status: DC | PRN
  Administered 2011-06-10: 35 mL

## 2011-06-10 MED ORDER — LACTATED RINGERS IV SOLN
INTRAVENOUS | Status: DC
  Administered 2011-06-10: 08:00:00 via INTRAVENOUS

## 2011-06-10 MED ORDER — PENTAZOCINE-NALOXONE 50-0.5 MG PO TABS: 1.0000 | ORAL_TABLET | ORAL | Status: AC | PRN

## 2011-06-10 MED ORDER — PROMETHAZINE HCL 25 MG/ML IJ SOLN: 6.2500 mg | INTRAMUSCULAR | Status: DC | PRN

## 2011-06-10 MED ORDER — BUPIVACAINE HCL (PF) 0.25 % IJ SOLN
INTRAMUSCULAR | Status: DC | PRN
  Administered 2011-06-10: 4.5 mL

## 2011-06-10 MED ORDER — FENTANYL CITRATE 0.05 MG/ML IJ SOLN: 25.0000 ug | INTRAMUSCULAR | Status: DC | PRN

## 2011-06-10 MED ORDER — VANCOMYCIN HCL IN DEXTROSE 1-5 GM/200ML-% IV SOLN
1000.0000 mg | INTRAVENOUS | Status: AC
  Administered 2011-06-10: 1000 mg via INTRAVENOUS

## 2011-06-10 SURGICAL SUPPLY — 31 items
BLADE SURG 15 STRL LF DISP TIS (BLADE) ×1 IMPLANT
BLADE SURG 15 STRL SS (BLADE) ×1
BNDG COHESIVE 1X5 TAN STRL LF (GAUZE/BANDAGES/DRESSINGS) ×2 IMPLANT
CHLORAPREP W/TINT 26ML (MISCELLANEOUS) ×2 IMPLANT
CLOTH BEACON ORANGE TIMEOUT ST (SAFETY) ×2 IMPLANT
CORDS BIPOLAR (ELECTRODE) ×2 IMPLANT
COVER MAYO STAND STRL (DRAPES) ×2 IMPLANT
COVER TABLE BACK 60X90 (DRAPES) ×2 IMPLANT
CUFF TOURNIQUET SINGLE 18IN (TOURNIQUET CUFF) ×2 IMPLANT
DRAPE EXTREMITY T 121X128X90 (DRAPE) ×2 IMPLANT
DRAPE SURG 17X23 STRL (DRAPES) ×2 IMPLANT
GAUZE XEROFORM 1X8 LF (GAUZE/BANDAGES/DRESSINGS) ×2 IMPLANT
GLOVE BIO SURGEON STRL SZ 6.5 (GLOVE) ×2 IMPLANT
GLOVE SURG ORTHO 8.0 STRL STRW (GLOVE) ×4 IMPLANT
GOWN BRE IMP PREV XXLGXLNG (GOWN DISPOSABLE) ×2 IMPLANT
GOWN PREVENTION PLUS XLARGE (GOWN DISPOSABLE) ×4 IMPLANT
NEEDLE 27GAX1X1/2 (NEEDLE) ×2 IMPLANT
NS IRRIG 1000ML POUR BTL (IV SOLUTION) ×2 IMPLANT
PACK BASIN DAY SURGERY FS (CUSTOM PROCEDURE TRAY) ×2 IMPLANT
PADDING CAST ABS 3INX4YD NS (CAST SUPPLIES) ×1
PADDING CAST ABS COTTON 3X4 (CAST SUPPLIES) ×1 IMPLANT
SPLINT FINGER 5/8X3.25 (SOFTGOODS) ×1 IMPLANT
SPLINT FINGER FOAM 3 9119 05 (SOFTGOODS) ×2
SPONGE GAUZE 4X4 12PLY (GAUZE/BANDAGES/DRESSINGS) ×2 IMPLANT
SPONGE GAUZE 4X4 FOR O.R. (GAUZE/BANDAGES/DRESSINGS) ×2 IMPLANT
STOCKINETTE 4X48 STRL (DRAPES) ×2 IMPLANT
SUT VICRYL RAPIDE 4/0 PS 2 (SUTURE) ×2 IMPLANT
SYR BULB 3OZ (MISCELLANEOUS) ×2 IMPLANT
SYR CONTROL 10ML LL (SYRINGE) ×2 IMPLANT
TOWEL OR 17X24 6PK STRL BLUE (TOWEL DISPOSABLE) ×2 IMPLANT
UNDERPAD 30X30 INCONTINENT (UNDERPADS AND DIAPERS) ×2 IMPLANT

## 2011-06-10 NOTE — Brief Op Note (Signed)
06/10/2011  10:10 AM  PATIENT:  Louretta Parma  77 y.o. female  PRE-OPERATIVE DIAGNOSIS:  mucoid tumor right thumb   POST-OPERATIVE DIAGNOSIS:  same as preop  PROCEDURE:  Procedure(s): EXCISION MASS debridement interphalangeal joint  SURGEON:  Surgeon(s): Nicki Reaper, MD Tami Ribas  PHYSICIAN ASSISTANT:   ASSISTANTS: Karlyn Agee, MD   ANESTHESIA:   local and regional  EBL:  Total I/O In: 500 [I.V.:500] Out: -   BLOOD ADMINISTERED:none  DRAINS: none   LOCAL MEDICATIONS USED:  MARCAINE 4.5CC  SPECIMEN:  Excision  DISPOSITION OF SPECIMEN:  PATHOLOGY  COUNTS:  YES  TOURNIQUET:   Total Tourniquet Time Documented: Forearm (Right) - 20 minutes  DICTATION: .Note written in EPIC  PLAN OF CARE: Discharge to home after PACU  PATIENT DISPOSITION:  PACU - hemodynamically stable.

## 2011-06-10 NOTE — Anesthesia Preprocedure Evaluation (Addendum)
Anesthesia Evaluation  Patient identified by MRN, date of birth, ID band Patient awake    Reviewed: Allergy & Precautions, H&P , NPO status , Patient's Chart, lab work & pertinent test results  History of Anesthesia Complications Negative for: history of anesthetic complications  Airway Mallampati: II TM Distance: >3 FB Neck ROM: Full  Mouth opening: Limited Mouth Opening  Dental No notable dental hx. (+) Teeth Intact   Pulmonary neg pulmonary ROS,  clear to auscultation  Pulmonary exam normal       Cardiovascular hypertension (took Clonidine today), Pt. on medications Regular Normal    Neuro/Psych Negative Neurological ROS     GI/Hepatic Neg liver ROS, GERD-  Controlled,  Endo/Other  obese  Renal/GU negative Renal ROS  Genitourinary negative   Musculoskeletal negative musculoskeletal ROS (+)   Abdominal (+) obese,   Peds  Hematology negative hematology ROS (+)   Anesthesia Other Findings   Reproductive/Obstetrics                         Anesthesia Physical Anesthesia Plan  ASA: II  Anesthesia Plan: MAC and Bier Block   Post-op Pain Management:    Induction:   Airway Management Planned: Simple Face Mask  Additional Equipment:   Intra-op Plan:   Post-operative Plan:   Informed Consent: I have reviewed the patients History and Physical, chart, labs and discussed the procedure including the risks, benefits and alternatives for the proposed anesthesia with the patient or authorized representative who has indicated his/her understanding and acceptance.   Dental advisory given  Plan Discussed with: CRNA and Surgeon  Anesthesia Plan Comments: (Plan routine monitors, IV Regional lidocaine)        Anesthesia Quick Evaluation

## 2011-06-10 NOTE — Op Note (Signed)
PROCEDURE:  The patient was brought to the operating room where a forearm-based IV regional anesthetic was carried out without difficulty. She was prepped using ChloraPrep, supine position, right arm free.  A 3- minutes dry time was allowed.  A time-out was taken confirming the patient and procedure.  Curvilinear incision was made over the right thumb interphalangeal joint and carried down through the subcutaneous tissue. Bleeders were electrocauterized with bipolar.  Dissection was carried down to the joint.  This was opened, debrided.  The mass was isolated distally.  With blunt and sharp dissection, this was dissected free and removed and sent to Pathology.  No further lesions were identified.  The wound was irrigated.  The skin was closed with interrupted 5-0 Vicryl Rapide sutures.  A metacarpal block was then given with 0.25% Marcaine without epinephrine, approximately 4.5 mL was used.  A sterile compressive dressing and splint to the thumb was applied.  On deflation of tourniquet, all fingers immediately pinked.  He was taken to the recovery room for observation in satisfactory condition.

## 2011-06-10 NOTE — Anesthesia Procedure Notes (Signed)
Procedure Name: MAC Performed by: Sharyne Richters Pre-anesthesia Checklist: Patient identified, Emergency Drugs available, Patient being monitored, Suction available and Timeout performed Patient Re-evaluated:Patient Re-evaluated prior to inductionOxygen Delivery Method: Simple face mask

## 2011-06-10 NOTE — Transfer of Care (Signed)
Immediate Anesthesia Transfer of Care Note  Patient: Patricia Garcia  Procedure(s) Performed:  EXCISION MASS - excision cyst right thumb IP, debridement IP right thumb  Patient Location: PACU  Anesthesia Type: Bier block  Level of Consciousness: awake, alert  and oriented  Airway & Oxygen Therapy: Patient Spontanous Breathing and Patient connected to face mask oxygen  Post-op Assessment: Report given to PACU RN and Post -op Vital signs reviewed and stable  Post vital signs: Reviewed and stable  Complications: No apparent anesthesia complications

## 2011-06-10 NOTE — Anesthesia Postprocedure Evaluation (Signed)
  Anesthesia Post-op Note  Patient: Patricia Garcia  Procedure(s) Performed:  EXCISION MASS - excision cyst right thumb IP, debridement IP right thumb  Patient Location: PACU  Anesthesia Type: MAC and Bier block  Level of Consciousness: awake and alert   Airway and Oxygen Therapy: Patient Spontanous Breathing  Post-op Pain: none  Post-op Assessment: Post-op Vital signs reviewed, Patient's Cardiovascular Status Stable, Respiratory Function Stable, Patent Airway, No signs of Nausea or vomiting and Pain level controlled  Post-op Vital Signs: Reviewed and stable  Complications: No apparent anesthesia complications

## 2011-06-10 NOTE — H&P (Signed)
Patricia Garcia is a 77 year-old right-hand dominant female referred by Dr. Wylene Simmer for consultation with respect to a mass on the IP joint of her right thumb.  This has been present for four months.  She recalls no history of injury.  She states she thought it might have been a spider bite.  She states it appeared after working in her garden and has gradually enlarged.  She complains of a mild aching type pain.  She states it is getting worse. Activity makes it worse.  She has no history of diabetes, thyroid problems.  She does have arthritis.  NO history of gout.  There is a family history of diabetes. She has been tested.     We have discussed the etiology of the problem with her, the reasons that it is called a mucoid cyst, we would recommend surgical excision with debridement of the joint.  The pre, peri and postoperative course were discussed along with the risks and complications.  The patient is aware there is no guarantee with the surgery, possibility of infection, recurrence, injury to arteries, nerves, tendons, incomplete relief of symptoms and dystrophy.  She would like to proceed. This will be scheduled at [facility] as an outpatient Patricia Garcia is an 77 y.o. female.   Chief Complaint: mucoid tumor HPI: see above  Past Medical History  Diagnosis Date  . Hypertension   . Arthritis   . Depression     Past Surgical History  Procedure Date  . Tonsillectomy   . Eye surgery     both cataracts    History reviewed. No pertinent family history. Social History:  reports that she quit smoking about 27 years ago. She does not have any smokeless tobacco history on file. She reports that she does not drink alcohol or use illicit drugs.  Allergies:  Allergies  Allergen Reactions  . Penicillins   . Sulfonamide Derivatives   . Tramadol     Medications Prior to Admission  Medication Dose Route Frequency Provider Last Rate Last Dose  . lactated ringers infusion   Intravenous  Continuous Aubery Lapping, MD 20 mL/hr at 06/10/11 4540     Medications Prior to Admission  Medication Sig Dispense Refill  . ALPRAZolam (XANAX) 0.25 MG tablet Take 0.25 mg by mouth at bedtime as needed.        Marland Kitchen aspirin 81 MG tablet Take 81 mg by mouth daily.        . calcium carbonate (OS-CAL - DOSED IN MG OF ELEMENTAL CALCIUM) 1250 MG tablet Take 1 tablet by mouth daily.        . cholecalciferol (VITAMIN D) 1000 UNITS tablet Take 1,000 Units by mouth daily.        . citalopram (CELEXA) 20 MG tablet Take 20 mg by mouth daily.        . cloNIDine (CATAPRES) 0.2 MG tablet Take 0.1 mg by mouth 3 (three) times daily.        Marland Kitchen estrogens, conjugated, (PREMARIN) 0.625 MG tablet Take 0.625 mg by mouth daily. Take daily for 21 days then do not take for 7 days.       . multivitamin-iron-minerals-folic acid (CENTRUM) chewable tablet Chew 1 tablet by mouth daily.        . psyllium (REGULOID) 0.52 G capsule Take 0.52 g by mouth 1 day or 1 dose.        . triamterene-hydrochlorothiazide (DYAZIDE) 37.5-25 MG per capsule Take 1 capsule by mouth 1 day or  1 dose.        . vitamin E 400 UNIT capsule Take 400 Units by mouth daily.          Results for orders placed during the hospital encounter of 06/10/11 (from the past 48 hour(s))  POCT HEMOGLOBIN-HEMACUE     Status: Normal   Collection Time   06/10/11  8:23 AM      Component Value Range Comment   Hemoglobin 13.6  12.0 - 15.0 (g/dL)     No results found.   A comprehensive review of systems was negative except for: Eyes: positive for contacts/glasses Ears, nose, mouth, throat, and face: positive for hearing loss  Blood pressure 159/85, pulse 71, temperature 98 F (36.7 C), temperature source Oral, resp. rate 20, height 5' 3.5" (1.613 m), weight 61.689 kg (136 lb), SpO2 97.00%.  General appearance: alert, cooperative and appears stated age Head: Normocephalic, without obvious abnormality Neck: no adenopathy Resp: clear to auscultation  bilaterally Cardio: regular rate and rhythm, S1, S2 normal, no murmur, click, rub or gallop GI: soft, non-tender; bowel sounds normal; no masses,  no organomegaly Extremities: extremities normal, atraumatic, no cyanosis or edema Pulses: 2+ and symmetric Skin: Skin color, texture, turgor normal. No rashes or lesions Neurologic: Grossly normal Incision/Wound: na  Assessment/Plan Excision cyst debridement dipjointrt thumb  Patricia Garcia R 06/10/2011, 8:44 AM

## 2011-06-20 ENCOUNTER — Encounter (HOSPITAL_BASED_OUTPATIENT_CLINIC_OR_DEPARTMENT_OTHER): Payer: Self-pay | Admitting: Orthopedic Surgery

## 2011-08-28 DIAGNOSIS — N390 Urinary tract infection, site not specified: Secondary | ICD-10-CM | POA: Diagnosis not present

## 2011-08-28 DIAGNOSIS — N952 Postmenopausal atrophic vaginitis: Secondary | ICD-10-CM | POA: Diagnosis not present

## 2011-08-28 DIAGNOSIS — B373 Candidiasis of vulva and vagina: Secondary | ICD-10-CM | POA: Diagnosis not present

## 2011-09-19 DIAGNOSIS — H4010X0 Unspecified open-angle glaucoma, stage unspecified: Secondary | ICD-10-CM | POA: Diagnosis not present

## 2011-09-19 DIAGNOSIS — H409 Unspecified glaucoma: Secondary | ICD-10-CM | POA: Diagnosis not present

## 2011-09-19 DIAGNOSIS — Z961 Presence of intraocular lens: Secondary | ICD-10-CM | POA: Diagnosis not present

## 2011-09-19 DIAGNOSIS — H52209 Unspecified astigmatism, unspecified eye: Secondary | ICD-10-CM | POA: Diagnosis not present

## 2011-09-22 ENCOUNTER — Encounter: Payer: Self-pay | Admitting: *Deleted

## 2011-09-23 ENCOUNTER — Ambulatory Visit (INDEPENDENT_AMBULATORY_CARE_PROVIDER_SITE_OTHER): Payer: Medicare Other | Admitting: Gastroenterology

## 2011-09-23 ENCOUNTER — Encounter: Payer: Self-pay | Admitting: Gastroenterology

## 2011-09-23 VITALS — BP 124/74 | HR 80 | Ht 63.5 in | Wt 136.0 lb

## 2011-09-23 DIAGNOSIS — K219 Gastro-esophageal reflux disease without esophagitis: Secondary | ICD-10-CM | POA: Diagnosis not present

## 2011-09-23 DIAGNOSIS — R143 Flatulence: Secondary | ICD-10-CM | POA: Diagnosis not present

## 2011-09-23 DIAGNOSIS — R109 Unspecified abdominal pain: Secondary | ICD-10-CM

## 2011-09-23 DIAGNOSIS — K5901 Slow transit constipation: Secondary | ICD-10-CM | POA: Diagnosis not present

## 2011-09-23 DIAGNOSIS — K6289 Other specified diseases of anus and rectum: Secondary | ICD-10-CM

## 2011-09-23 DIAGNOSIS — G8929 Other chronic pain: Secondary | ICD-10-CM

## 2011-09-23 DIAGNOSIS — R141 Gas pain: Secondary | ICD-10-CM | POA: Diagnosis not present

## 2011-09-23 MED ORDER — PRAMOXINE-HC 1-1 % EX CREA: TOPICAL_CREAM | CUTANEOUS | Status: AC

## 2011-09-23 MED ORDER — ESOMEPRAZOLE MAGNESIUM 40 MG PO CPDR: 40.0000 mg | DELAYED_RELEASE_CAPSULE | Freq: Every day | ORAL | Status: DC

## 2011-09-23 MED ORDER — LINACLOTIDE 145 MCG PO CAPS: 1.0000 | ORAL_CAPSULE | Freq: Every day | ORAL | Status: DC

## 2011-09-23 NOTE — Patient Instructions (Signed)
Try Linzess one tablet by mouth on an empty stomach, samples given if it works well cal back for a rx.  Take Nexium one tablet by mouth 30 mins before supper, rx sent to your pharmacy.  Make an office visit to come back and follow up with Dr Jarold Motto in one month.

## 2011-09-23 NOTE — Progress Notes (Signed)
This is a 78 year old Caucasian female with chronic diverticulosis, IBS, and acid reflux. She is off PPI therapy and has had nocturnal awakening with her acid reflux, also left-sided chest pain relieved by antacids. She denies painful swallowing or dysphagia. She also has noticed lower abdominal discomfort with gas and bloating and mild exacerbation of her chronic constipation, but no melena or hematochezia, hepatobiliary complaints, with systemic complaints. She follows a high fiber diet with liberal by mouth fluids. Endoscopy and colonoscopy were performed approximately a year ago. Does not use alcohol, cigarettes, or NSAIDs. Of her problems seem to been worse over the last 4 weeks with associated belching and burping.  Current Medications, Allergies, Past Medical History, Past Surgical History, Family History and Social History were reviewed in [employer] record.  Pertinent Review of Systems Negative   Physical Exam: Healthy patient in no distress. Blood pressure 124/74 and pulse 80 and regular. BMI 23.7. I cannot appreciate stigmata of chronic liver disease, thyromegaly or lymphadenopathy. Her chest is clear and she appears to be in a regular rhythm without murmurs gallops or rubs. There is no organomegaly, abdominal masses or tenderness. Bowel sounds are normal. Inspection of the rectum was unremarkable except for some area of skin tags and increased erythema. Rectal exam shows no masses or tenderness but very hard almost impacted stool which is guaiac-negative. Peripheral extremities are unremarkable mental status is normal.    Assessment and Plan: Constipation predominant IBS with associated diverticulosis. However her try Linzess and 45 mg a day for her constipation, Nexium 40 mg daily for acid reflux, and a FODMAP diet, when necessary Analmantle-HC cream locally to her anal area, with office followup in one month's time. Standard antireflux maneuvers reviewed. No  diagnosis found.

## 2011-09-23 NOTE — Progress Notes (Signed)
Addended by: Harlow Mares D on: 09/23/2011 03:26 PM   Modules accepted: Orders

## 2011-09-29 ENCOUNTER — Telehealth: Payer: Self-pay | Admitting: Gastroenterology

## 2011-09-29 NOTE — Telephone Encounter (Signed)
prv linzess,continue diet

## 2011-09-29 NOTE — Telephone Encounter (Signed)
Informed pt it's OK to take Linzess when needed and continue the diet as tolerated; pt stated understanding.

## 2011-09-29 NOTE — Telephone Encounter (Signed)
Pt called to give an update on her gas and constipation. She reports diarrhea when she takes the Linzess- feels cleaned out; wants to know if she can take it PRN? She also states she has had no gas since starting the FODMAP diet. She had a lot of questions on foods that she can and can't eat. She also reports no stomach pain since starting on Prilosec; she had questions on how often to take it but that was resolved. Dr Jarold Motto, OK for pt to take Linzess PRN? Thanks.

## 2011-10-14 ENCOUNTER — Encounter: Payer: Self-pay | Admitting: *Deleted

## 2011-10-21 ENCOUNTER — Encounter: Payer: Self-pay | Admitting: Gastroenterology

## 2011-10-21 ENCOUNTER — Ambulatory Visit (INDEPENDENT_AMBULATORY_CARE_PROVIDER_SITE_OTHER): Payer: Medicare Other | Admitting: Gastroenterology

## 2011-10-21 VITALS — BP 124/76 | HR 64 | Ht 63.5 in | Wt 135.0 lb

## 2011-10-21 DIAGNOSIS — K589 Irritable bowel syndrome without diarrhea: Secondary | ICD-10-CM | POA: Diagnosis not present

## 2011-10-21 DIAGNOSIS — K219 Gastro-esophageal reflux disease without esophagitis: Secondary | ICD-10-CM | POA: Diagnosis not present

## 2011-10-21 NOTE — Progress Notes (Signed)
This is a 78 year old Caucasian female with chronic IBS and chronic GERD. She is currently asymptomatic on daily Prilosec 20 mg, and strict adherence to a FODMAP IBS diet. She could not tolerate Linzess 145 mcg.and  currently has  normal bowel movement with markedly decreased gas and bloating. She relates that her reflux symptoms and hoarseness have almost completely cleared with PPI therapy. She denies any dysphagia, hepatobiliary or lower gastrointestinal issues at this time. She does have chronic stress/anxiety syndrome and takes when necessary Xanax 0.25 mg. The patient describes intermittent right lower quadrant pain in an area where she had previous endometriosis involving her colon,with lysis of adhesions many years ago. The patient does use when necessary and Analpram cream, but denies hemorrhoidal problems at this time. She also relates that her gas and distention have markedly improved. She continues to express concern that she may have occult pancreatic cancer.  Current Medications, Allergies, Past Medical History, Past Surgical History, Family History and Social History were reviewed in [employer] record.  Pertinent Review of Systems Negative   Physical Exam: Blood pressure 124/76 and pulse 64 and regular. BMI 23.54. Healthy appearing patient in no acute distress. I cannot appreciate stigmata of chronic liver disease. Cannot appreciate abdominal distention, organomegaly, masses or tenderness. Bowel sounds are normal. This patient is smiling and cheerful and relates that she has never felt better with all of her GI issues.   Assessment and Plan: GERD doing well on PPI therapy. Diarrhea predominant IBS much improved on FODMAP IBS diet which I have asked her to continue as tolerated. She will see [place] on a when necessary basis as needed. Otherwise she is to continue her medications as listed and reviewed.  Please copy her primary care physician, referring physician, and  pertinent subspecialists. No diagnosis found.

## 2011-10-21 NOTE — Patient Instructions (Signed)
Call back as needed.  Continue all medication

## 2011-10-30 DIAGNOSIS — M899 Disorder of bone, unspecified: Secondary | ICD-10-CM | POA: Diagnosis not present

## 2011-10-30 DIAGNOSIS — S0010XA Contusion of unspecified eyelid and periocular area, initial encounter: Secondary | ICD-10-CM | POA: Diagnosis not present

## 2011-10-30 DIAGNOSIS — M949 Disorder of cartilage, unspecified: Secondary | ICD-10-CM | POA: Diagnosis not present

## 2011-10-30 DIAGNOSIS — I10 Essential (primary) hypertension: Secondary | ICD-10-CM | POA: Diagnosis not present

## 2011-10-30 DIAGNOSIS — S0003XA Contusion of scalp, initial encounter: Secondary | ICD-10-CM | POA: Diagnosis not present

## 2011-10-30 DIAGNOSIS — S1093XA Contusion of unspecified part of neck, initial encounter: Secondary | ICD-10-CM | POA: Diagnosis not present

## 2011-10-30 DIAGNOSIS — S46909A Unspecified injury of unspecified muscle, fascia and tendon at shoulder and upper arm level, unspecified arm, initial encounter: Secondary | ICD-10-CM | POA: Diagnosis not present

## 2011-10-30 DIAGNOSIS — S4980XA Other specified injuries of shoulder and upper arm, unspecified arm, initial encounter: Secondary | ICD-10-CM | POA: Diagnosis not present

## 2011-10-31 DIAGNOSIS — M25519 Pain in unspecified shoulder: Secondary | ICD-10-CM | POA: Diagnosis not present

## 2011-10-31 DIAGNOSIS — S4980XA Other specified injuries of shoulder and upper arm, unspecified arm, initial encounter: Secondary | ICD-10-CM | POA: Diagnosis not present

## 2011-11-18 DIAGNOSIS — F411 Generalized anxiety disorder: Secondary | ICD-10-CM | POA: Diagnosis not present

## 2011-11-18 DIAGNOSIS — E559 Vitamin D deficiency, unspecified: Secondary | ICD-10-CM | POA: Diagnosis not present

## 2011-11-18 DIAGNOSIS — N182 Chronic kidney disease, stage 2 (mild): Secondary | ICD-10-CM | POA: Diagnosis not present

## 2011-11-18 DIAGNOSIS — R7301 Impaired fasting glucose: Secondary | ICD-10-CM | POA: Diagnosis not present

## 2011-11-18 DIAGNOSIS — R809 Proteinuria, unspecified: Secondary | ICD-10-CM | POA: Diagnosis not present

## 2011-11-18 DIAGNOSIS — I10 Essential (primary) hypertension: Secondary | ICD-10-CM | POA: Diagnosis not present

## 2012-01-08 DIAGNOSIS — Z01419 Encounter for gynecological examination (general) (routine) without abnormal findings: Secondary | ICD-10-CM | POA: Diagnosis not present

## 2012-01-08 DIAGNOSIS — Z1212 Encounter for screening for malignant neoplasm of rectum: Secondary | ICD-10-CM | POA: Diagnosis not present

## 2012-01-26 DIAGNOSIS — H409 Unspecified glaucoma: Secondary | ICD-10-CM | POA: Diagnosis not present

## 2012-01-26 DIAGNOSIS — H4011X0 Primary open-angle glaucoma, stage unspecified: Secondary | ICD-10-CM | POA: Diagnosis not present

## 2012-03-15 DIAGNOSIS — M25519 Pain in unspecified shoulder: Secondary | ICD-10-CM | POA: Diagnosis not present

## 2012-03-15 DIAGNOSIS — S4350XA Sprain of unspecified acromioclavicular joint, initial encounter: Secondary | ICD-10-CM | POA: Diagnosis not present

## 2012-03-15 DIAGNOSIS — M79609 Pain in unspecified limb: Secondary | ICD-10-CM | POA: Diagnosis not present

## 2012-03-18 ENCOUNTER — Other Ambulatory Visit: Payer: Self-pay

## 2012-03-18 MED ORDER — OMEPRAZOLE 20 MG PO CPDR: 20.0000 mg | DELAYED_RELEASE_CAPSULE | Freq: Every day | ORAL | Status: DC

## 2012-03-24 ENCOUNTER — Other Ambulatory Visit: Payer: Self-pay

## 2012-03-24 MED ORDER — OMEPRAZOLE 20 MG PO CPDR: 20.0000 mg | DELAYED_RELEASE_CAPSULE | Freq: Every day | ORAL | Status: DC

## 2012-03-29 DIAGNOSIS — M25519 Pain in unspecified shoulder: Secondary | ICD-10-CM | POA: Diagnosis not present

## 2012-04-05 DIAGNOSIS — M25519 Pain in unspecified shoulder: Secondary | ICD-10-CM | POA: Diagnosis not present

## 2012-04-14 DIAGNOSIS — M25519 Pain in unspecified shoulder: Secondary | ICD-10-CM | POA: Diagnosis not present

## 2012-04-15 DIAGNOSIS — M25519 Pain in unspecified shoulder: Secondary | ICD-10-CM | POA: Diagnosis not present

## 2012-05-13 DIAGNOSIS — M25519 Pain in unspecified shoulder: Secondary | ICD-10-CM | POA: Diagnosis not present

## 2012-05-17 DIAGNOSIS — Z1231 Encounter for screening mammogram for malignant neoplasm of breast: Secondary | ICD-10-CM | POA: Diagnosis not present

## 2012-05-17 DIAGNOSIS — Z803 Family history of malignant neoplasm of breast: Secondary | ICD-10-CM | POA: Diagnosis not present

## 2012-05-19 DIAGNOSIS — R7301 Impaired fasting glucose: Secondary | ICD-10-CM | POA: Diagnosis not present

## 2012-05-19 DIAGNOSIS — M899 Disorder of bone, unspecified: Secondary | ICD-10-CM | POA: Diagnosis not present

## 2012-05-19 DIAGNOSIS — I10 Essential (primary) hypertension: Secondary | ICD-10-CM | POA: Diagnosis not present

## 2012-05-19 DIAGNOSIS — M949 Disorder of cartilage, unspecified: Secondary | ICD-10-CM | POA: Diagnosis not present

## 2012-05-25 DIAGNOSIS — H409 Unspecified glaucoma: Secondary | ICD-10-CM | POA: Diagnosis not present

## 2012-05-25 DIAGNOSIS — H4011X0 Primary open-angle glaucoma, stage unspecified: Secondary | ICD-10-CM | POA: Diagnosis not present

## 2012-05-26 DIAGNOSIS — Z23 Encounter for immunization: Secondary | ICD-10-CM | POA: Diagnosis not present

## 2012-05-26 DIAGNOSIS — R809 Proteinuria, unspecified: Secondary | ICD-10-CM | POA: Diagnosis not present

## 2012-05-26 DIAGNOSIS — N182 Chronic kidney disease, stage 2 (mild): Secondary | ICD-10-CM | POA: Diagnosis not present

## 2012-05-26 DIAGNOSIS — Z0000 Encounter for general adult medical examination without abnormal findings: Secondary | ICD-10-CM | POA: Diagnosis not present

## 2012-05-26 DIAGNOSIS — I10 Essential (primary) hypertension: Secondary | ICD-10-CM | POA: Diagnosis not present

## 2012-06-10 DIAGNOSIS — H409 Unspecified glaucoma: Secondary | ICD-10-CM | POA: Diagnosis not present

## 2012-06-10 DIAGNOSIS — H4011X0 Primary open-angle glaucoma, stage unspecified: Secondary | ICD-10-CM | POA: Diagnosis not present

## 2012-07-24 IMAGING — US US ABDOMEN COMPLETE
1 series · 14 of 25 positions shown · non-contrast
Comparison: None.

CLINICAL DATA: Abdominal pain radiating to the back

COMPLETE ABDOMINAL ULTRASOUND

[Series 1: us abdomen complete · 0.25mm/px · 14 of 99 slices shown]
[im 1/99]
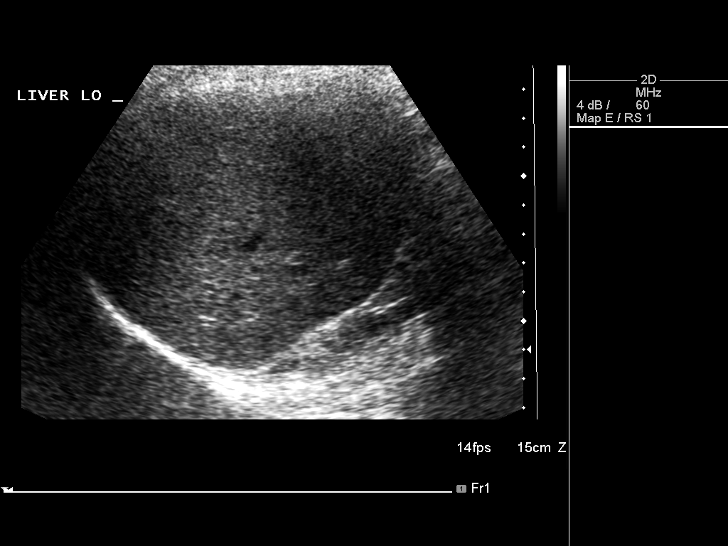
[im 9/99]
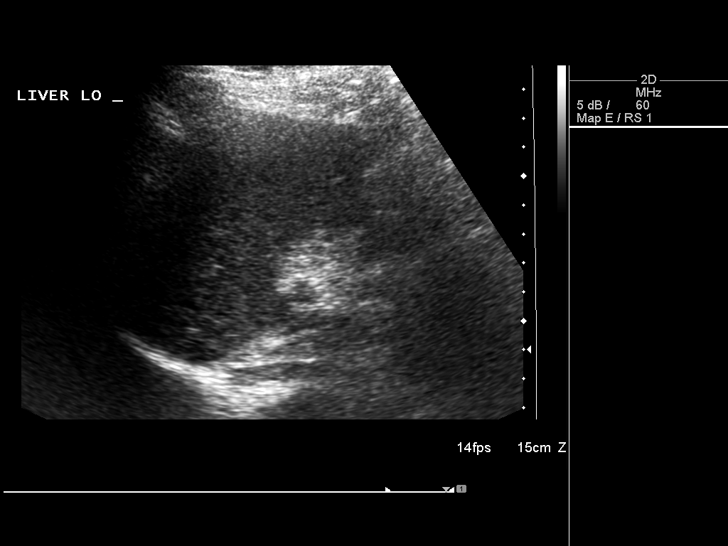
[im 17/99]
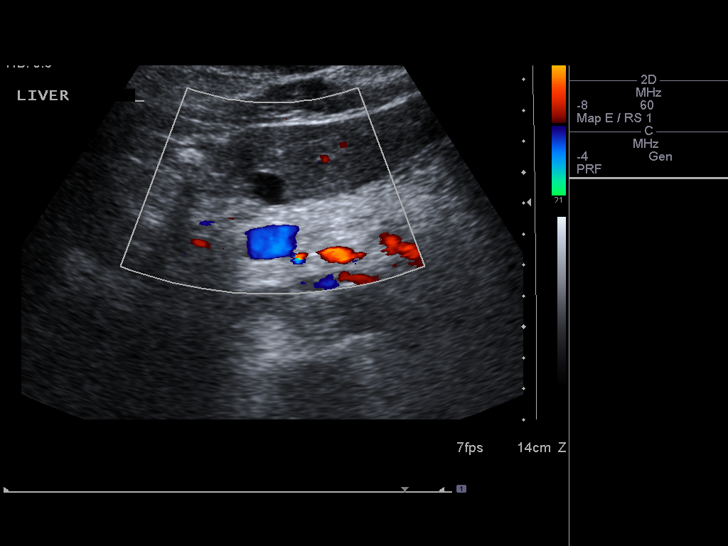
[im 25/99]
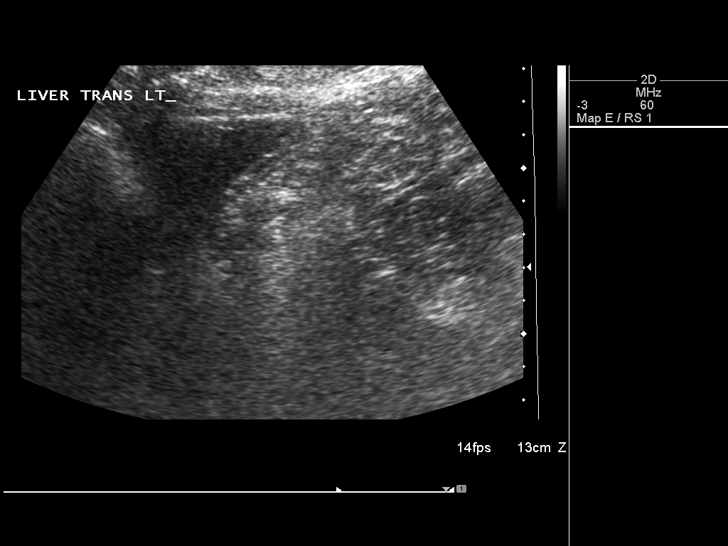
[im 33/99]
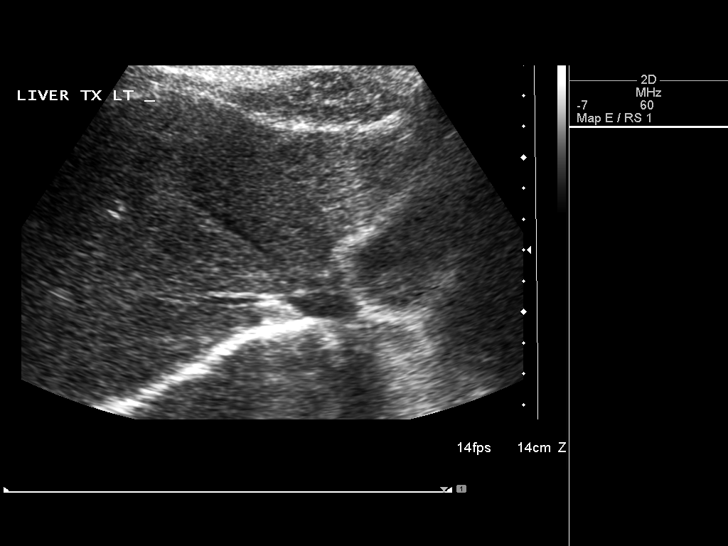
[im 37/99]
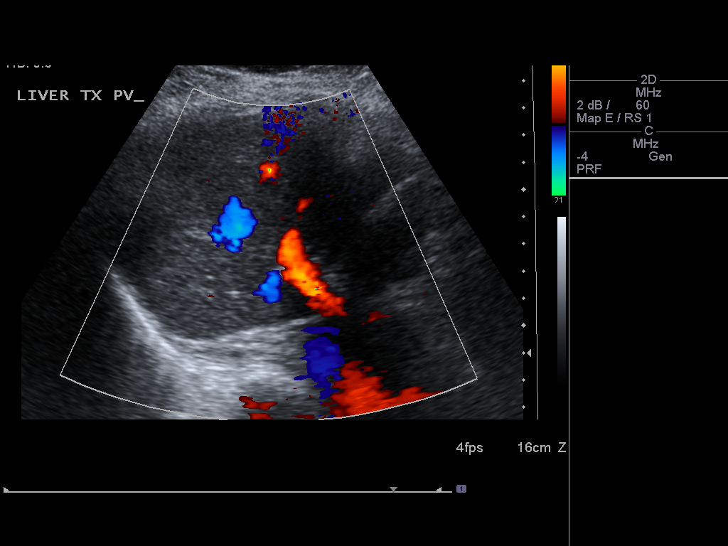
[im 45/99]
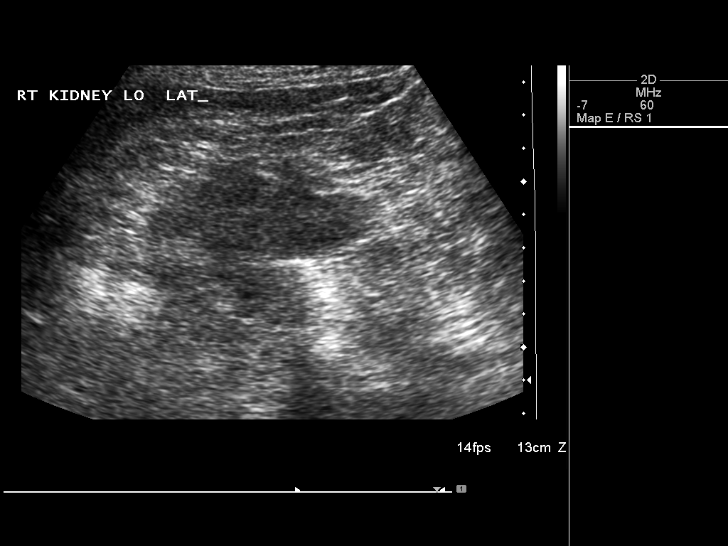
[im 54/99]
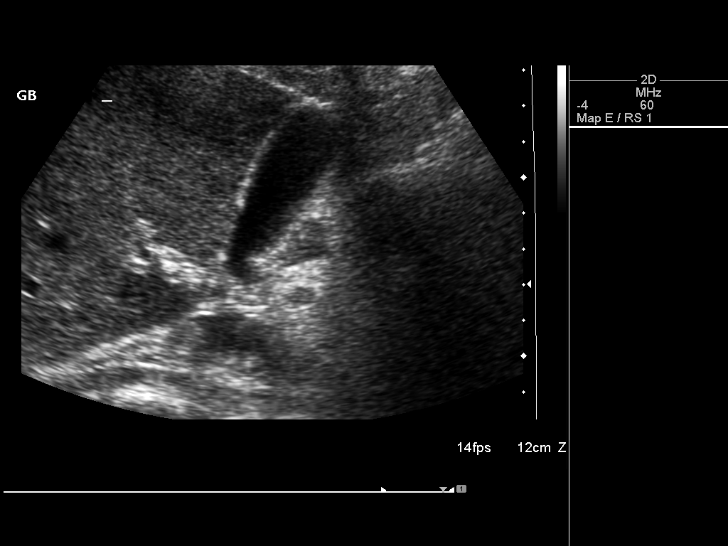
[im 62/99]
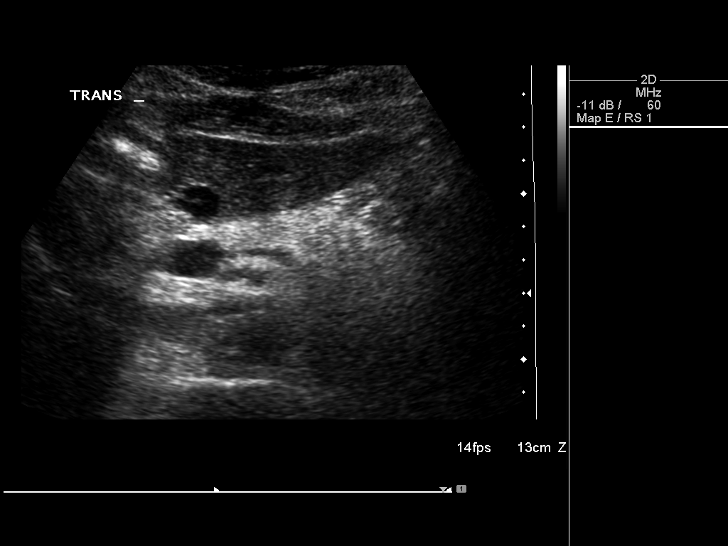
[im 66/99]
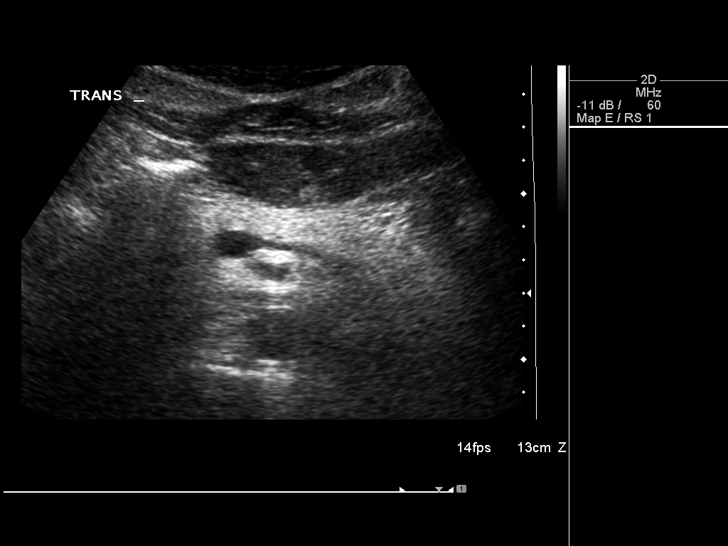
[im 74/99]
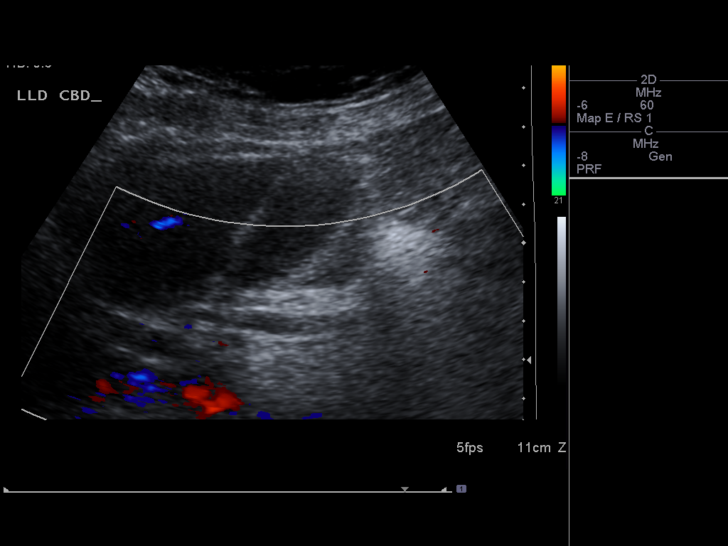
[im 82/99]
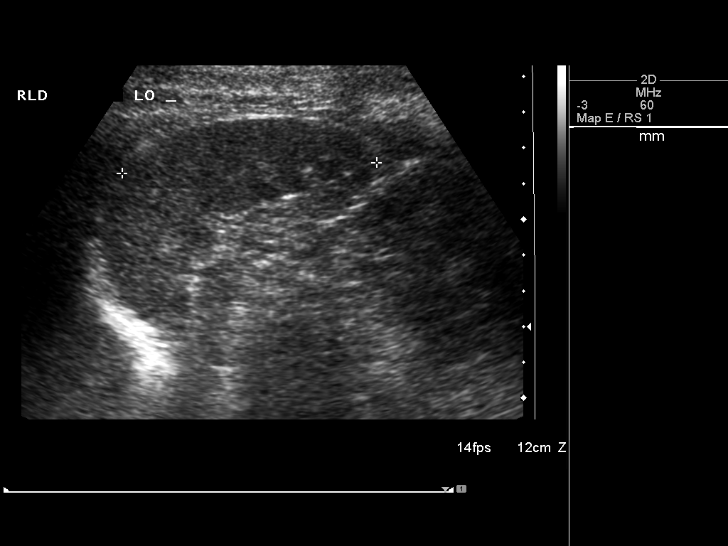
[im 90/99]
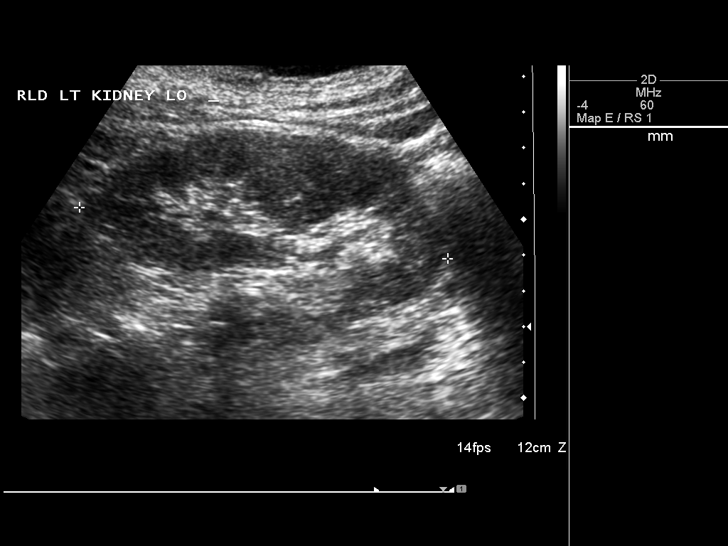
[im 99/99]
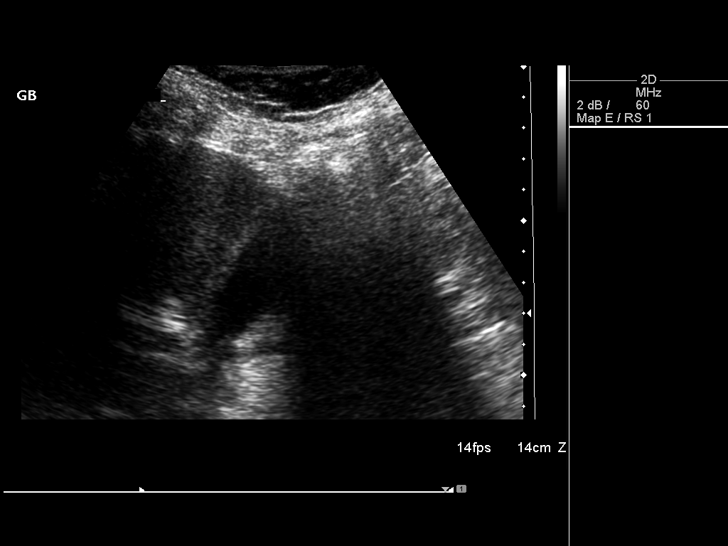

[14 of 25 positions shown; findings below may reference images not displayed]

FINDINGS: Gallbladder:  The gallbladder is visualized and no gallstones are
noted.

Common bile duct:  The common bile duct is normal measuring up to 6
mm in diameter.

Liver:  The liver has a normal echogenic pattern. A cyst is noted
in the left lobe of 1.6 cm.  There is also a hyperechogenic focus
in the left lobe most consistent with hemangioma of 1.6 cm in
diameter.

IVC:  Appears normal.

Pancreas:  The head of the pancreas is partially obscured by bowel
gas.

Spleen:  The spleen is normal measuring 7.1 cm sagittally.

Right Kidney:  No hydronephrosis is seen.  The right kidney
measures 9.1 cm sagittally.There is minimal fullness of the upper
pole collecting system of questionable significance.

Left Kidney:  No hydronephrosis.  The left kidney measures 10.4 cm.

Abdominal aorta:  The abdominal aorta is normal in caliber.
IMPRESSION: 1.  No gallstones.
2.  Probable left lobe of liver hemangioma of 1.6 cm.
3.  The head of the pancreas is obscured by bowel gas.

## 2012-09-13 DIAGNOSIS — M999 Biomechanical lesion, unspecified: Secondary | ICD-10-CM | POA: Diagnosis not present

## 2012-09-13 DIAGNOSIS — M9981 Other biomechanical lesions of cervical region: Secondary | ICD-10-CM | POA: Diagnosis not present

## 2012-09-13 DIAGNOSIS — M546 Pain in thoracic spine: Secondary | ICD-10-CM | POA: Diagnosis not present

## 2012-09-13 DIAGNOSIS — M542 Cervicalgia: Secondary | ICD-10-CM | POA: Diagnosis not present

## 2012-09-14 DIAGNOSIS — M9981 Other biomechanical lesions of cervical region: Secondary | ICD-10-CM | POA: Diagnosis not present

## 2012-09-14 DIAGNOSIS — M542 Cervicalgia: Secondary | ICD-10-CM | POA: Diagnosis not present

## 2012-09-14 DIAGNOSIS — M546 Pain in thoracic spine: Secondary | ICD-10-CM | POA: Diagnosis not present

## 2012-09-14 DIAGNOSIS — M999 Biomechanical lesion, unspecified: Secondary | ICD-10-CM | POA: Diagnosis not present

## 2012-09-16 DIAGNOSIS — M542 Cervicalgia: Secondary | ICD-10-CM | POA: Diagnosis not present

## 2012-09-16 DIAGNOSIS — M546 Pain in thoracic spine: Secondary | ICD-10-CM | POA: Diagnosis not present

## 2012-09-16 DIAGNOSIS — M999 Biomechanical lesion, unspecified: Secondary | ICD-10-CM | POA: Diagnosis not present

## 2012-09-16 DIAGNOSIS — M9981 Other biomechanical lesions of cervical region: Secondary | ICD-10-CM | POA: Diagnosis not present

## 2012-10-01 DIAGNOSIS — H9319 Tinnitus, unspecified ear: Secondary | ICD-10-CM | POA: Diagnosis not present

## 2012-10-01 DIAGNOSIS — H9190 Unspecified hearing loss, unspecified ear: Secondary | ICD-10-CM | POA: Diagnosis not present

## 2012-10-01 DIAGNOSIS — H6120 Impacted cerumen, unspecified ear: Secondary | ICD-10-CM | POA: Diagnosis not present

## 2012-10-01 DIAGNOSIS — H8110 Benign paroxysmal vertigo, unspecified ear: Secondary | ICD-10-CM | POA: Diagnosis not present

## 2012-10-08 DIAGNOSIS — H04229 Epiphora due to insufficient drainage, unspecified lacrimal gland: Secondary | ICD-10-CM | POA: Diagnosis not present

## 2012-10-08 DIAGNOSIS — H4011X0 Primary open-angle glaucoma, stage unspecified: Secondary | ICD-10-CM | POA: Diagnosis not present

## 2012-10-08 DIAGNOSIS — H409 Unspecified glaucoma: Secondary | ICD-10-CM | POA: Diagnosis not present

## 2012-10-08 DIAGNOSIS — H1030 Unspecified acute conjunctivitis, unspecified eye: Secondary | ICD-10-CM | POA: Diagnosis not present

## 2012-10-22 DIAGNOSIS — H16109 Unspecified superficial keratitis, unspecified eye: Secondary | ICD-10-CM | POA: Diagnosis not present

## 2012-10-22 DIAGNOSIS — H409 Unspecified glaucoma: Secondary | ICD-10-CM | POA: Diagnosis not present

## 2012-10-22 DIAGNOSIS — H4011X0 Primary open-angle glaucoma, stage unspecified: Secondary | ICD-10-CM | POA: Diagnosis not present

## 2012-11-22 DIAGNOSIS — M949 Disorder of cartilage, unspecified: Secondary | ICD-10-CM | POA: Diagnosis not present

## 2012-11-22 DIAGNOSIS — K219 Gastro-esophageal reflux disease without esophagitis: Secondary | ICD-10-CM | POA: Diagnosis not present

## 2012-11-22 DIAGNOSIS — E559 Vitamin D deficiency, unspecified: Secondary | ICD-10-CM | POA: Diagnosis not present

## 2012-11-22 DIAGNOSIS — F411 Generalized anxiety disorder: Secondary | ICD-10-CM | POA: Diagnosis not present

## 2012-11-22 DIAGNOSIS — I10 Essential (primary) hypertension: Secondary | ICD-10-CM | POA: Diagnosis not present

## 2012-11-22 DIAGNOSIS — Z1331 Encounter for screening for depression: Secondary | ICD-10-CM | POA: Diagnosis not present

## 2012-11-22 DIAGNOSIS — J301 Allergic rhinitis due to pollen: Secondary | ICD-10-CM | POA: Diagnosis not present

## 2012-11-22 DIAGNOSIS — R7301 Impaired fasting glucose: Secondary | ICD-10-CM | POA: Diagnosis not present

## 2013-02-09 DIAGNOSIS — Z124 Encounter for screening for malignant neoplasm of cervix: Secondary | ICD-10-CM | POA: Diagnosis not present

## 2013-02-09 DIAGNOSIS — Z1212 Encounter for screening for malignant neoplasm of rectum: Secondary | ICD-10-CM | POA: Diagnosis not present

## 2013-02-09 DIAGNOSIS — B373 Candidiasis of vulva and vagina: Secondary | ICD-10-CM | POA: Diagnosis not present

## 2013-04-28 DIAGNOSIS — H4011X0 Primary open-angle glaucoma, stage unspecified: Secondary | ICD-10-CM | POA: Diagnosis not present

## 2013-04-28 DIAGNOSIS — H409 Unspecified glaucoma: Secondary | ICD-10-CM | POA: Diagnosis not present

## 2013-05-20 DIAGNOSIS — Z23 Encounter for immunization: Secondary | ICD-10-CM | POA: Diagnosis not present

## 2013-05-20 DIAGNOSIS — Z1231 Encounter for screening mammogram for malignant neoplasm of breast: Secondary | ICD-10-CM | POA: Diagnosis not present

## 2013-06-01 DIAGNOSIS — I10 Essential (primary) hypertension: Secondary | ICD-10-CM | POA: Diagnosis not present

## 2013-06-01 DIAGNOSIS — M899 Disorder of bone, unspecified: Secondary | ICD-10-CM | POA: Diagnosis not present

## 2013-06-01 DIAGNOSIS — R7301 Impaired fasting glucose: Secondary | ICD-10-CM | POA: Diagnosis not present

## 2013-06-01 DIAGNOSIS — R82998 Other abnormal findings in urine: Secondary | ICD-10-CM | POA: Diagnosis not present

## 2013-06-01 DIAGNOSIS — E559 Vitamin D deficiency, unspecified: Secondary | ICD-10-CM | POA: Diagnosis not present

## 2013-06-08 DIAGNOSIS — I10 Essential (primary) hypertension: Secondary | ICD-10-CM | POA: Diagnosis not present

## 2013-06-08 DIAGNOSIS — R809 Proteinuria, unspecified: Secondary | ICD-10-CM | POA: Diagnosis not present

## 2013-06-08 DIAGNOSIS — Z23 Encounter for immunization: Secondary | ICD-10-CM | POA: Diagnosis not present

## 2013-06-08 DIAGNOSIS — M899 Disorder of bone, unspecified: Secondary | ICD-10-CM | POA: Diagnosis not present

## 2013-06-08 DIAGNOSIS — E559 Vitamin D deficiency, unspecified: Secondary | ICD-10-CM | POA: Diagnosis not present

## 2013-06-08 DIAGNOSIS — N182 Chronic kidney disease, stage 2 (mild): Secondary | ICD-10-CM | POA: Diagnosis not present

## 2013-06-08 DIAGNOSIS — Z0000 Encounter for general adult medical examination without abnormal findings: Secondary | ICD-10-CM | POA: Diagnosis not present

## 2013-06-08 DIAGNOSIS — M5030 Other cervical disc degeneration, unspecified cervical region: Secondary | ICD-10-CM | POA: Diagnosis not present

## 2013-06-08 DIAGNOSIS — K589 Irritable bowel syndrome without diarrhea: Secondary | ICD-10-CM | POA: Diagnosis not present

## 2013-07-01 DIAGNOSIS — IMO0002 Reserved for concepts with insufficient information to code with codable children: Secondary | ICD-10-CM | POA: Diagnosis not present

## 2013-07-01 DIAGNOSIS — R05 Cough: Secondary | ICD-10-CM | POA: Diagnosis not present

## 2013-09-16 DIAGNOSIS — H409 Unspecified glaucoma: Secondary | ICD-10-CM | POA: Diagnosis not present

## 2013-09-16 DIAGNOSIS — H4011X0 Primary open-angle glaucoma, stage unspecified: Secondary | ICD-10-CM | POA: Diagnosis not present

## 2013-10-26 DIAGNOSIS — H16209 Unspecified keratoconjunctivitis, unspecified eye: Secondary | ICD-10-CM | POA: Diagnosis not present

## 2013-12-14 DIAGNOSIS — F411 Generalized anxiety disorder: Secondary | ICD-10-CM | POA: Diagnosis not present

## 2013-12-14 DIAGNOSIS — Z79890 Hormone replacement therapy (postmenopausal): Secondary | ICD-10-CM | POA: Diagnosis not present

## 2013-12-14 DIAGNOSIS — N182 Chronic kidney disease, stage 2 (mild): Secondary | ICD-10-CM | POA: Diagnosis not present

## 2013-12-14 DIAGNOSIS — E559 Vitamin D deficiency, unspecified: Secondary | ICD-10-CM | POA: Diagnosis not present

## 2013-12-14 DIAGNOSIS — M899 Disorder of bone, unspecified: Secondary | ICD-10-CM | POA: Diagnosis not present

## 2013-12-14 DIAGNOSIS — I10 Essential (primary) hypertension: Secondary | ICD-10-CM | POA: Diagnosis not present

## 2013-12-14 DIAGNOSIS — R7301 Impaired fasting glucose: Secondary | ICD-10-CM | POA: Diagnosis not present

## 2013-12-14 DIAGNOSIS — K219 Gastro-esophageal reflux disease without esophagitis: Secondary | ICD-10-CM | POA: Diagnosis not present

## 2014-01-16 DIAGNOSIS — H01009 Unspecified blepharitis unspecified eye, unspecified eyelid: Secondary | ICD-10-CM | POA: Diagnosis not present

## 2014-02-15 DIAGNOSIS — N760 Acute vaginitis: Secondary | ICD-10-CM | POA: Diagnosis not present

## 2014-02-15 DIAGNOSIS — Z01419 Encounter for gynecological examination (general) (routine) without abnormal findings: Secondary | ICD-10-CM | POA: Diagnosis not present

## 2014-03-17 DIAGNOSIS — H4011X0 Primary open-angle glaucoma, stage unspecified: Secondary | ICD-10-CM | POA: Diagnosis not present

## 2014-03-17 DIAGNOSIS — H409 Unspecified glaucoma: Secondary | ICD-10-CM | POA: Diagnosis not present

## 2014-03-30 ENCOUNTER — Encounter: Payer: Self-pay | Admitting: Gastroenterology

## 2014-04-24 DIAGNOSIS — Z23 Encounter for immunization: Secondary | ICD-10-CM | POA: Diagnosis not present

## 2014-04-24 DIAGNOSIS — Z6824 Body mass index (BMI) 24.0-24.9, adult: Secondary | ICD-10-CM | POA: Diagnosis not present

## 2014-04-24 DIAGNOSIS — R279 Unspecified lack of coordination: Secondary | ICD-10-CM | POA: Diagnosis not present

## 2014-05-22 DIAGNOSIS — Z803 Family history of malignant neoplasm of breast: Secondary | ICD-10-CM | POA: Diagnosis not present

## 2014-05-22 DIAGNOSIS — Z1231 Encounter for screening mammogram for malignant neoplasm of breast: Secondary | ICD-10-CM | POA: Diagnosis not present

## 2014-06-19 DIAGNOSIS — R7302 Impaired glucose tolerance (oral): Secondary | ICD-10-CM | POA: Diagnosis not present

## 2014-06-19 DIAGNOSIS — M859 Disorder of bone density and structure, unspecified: Secondary | ICD-10-CM | POA: Diagnosis not present

## 2014-06-19 DIAGNOSIS — I10 Essential (primary) hypertension: Secondary | ICD-10-CM | POA: Diagnosis not present

## 2014-06-19 DIAGNOSIS — Z0000 Encounter for general adult medical examination without abnormal findings: Secondary | ICD-10-CM | POA: Diagnosis not present

## 2014-06-26 DIAGNOSIS — M5030 Other cervical disc degeneration, unspecified cervical region: Secondary | ICD-10-CM | POA: Diagnosis not present

## 2014-06-26 DIAGNOSIS — E785 Hyperlipidemia, unspecified: Secondary | ICD-10-CM | POA: Diagnosis not present

## 2014-06-26 DIAGNOSIS — K589 Irritable bowel syndrome without diarrhea: Secondary | ICD-10-CM | POA: Diagnosis not present

## 2014-06-26 DIAGNOSIS — Z008 Encounter for other general examination: Secondary | ICD-10-CM | POA: Diagnosis not present

## 2014-06-26 DIAGNOSIS — N182 Chronic kidney disease, stage 2 (mild): Secondary | ICD-10-CM | POA: Diagnosis not present

## 2014-06-26 DIAGNOSIS — Z1389 Encounter for screening for other disorder: Secondary | ICD-10-CM | POA: Diagnosis not present

## 2014-06-26 DIAGNOSIS — H9319 Tinnitus, unspecified ear: Secondary | ICD-10-CM | POA: Diagnosis not present

## 2014-06-26 DIAGNOSIS — H9190 Unspecified hearing loss, unspecified ear: Secondary | ICD-10-CM | POA: Diagnosis not present

## 2014-06-26 DIAGNOSIS — R9431 Abnormal electrocardiogram [ECG] [EKG]: Secondary | ICD-10-CM | POA: Diagnosis not present

## 2014-06-29 DIAGNOSIS — Z1212 Encounter for screening for malignant neoplasm of rectum: Secondary | ICD-10-CM | POA: Diagnosis not present

## 2014-09-15 DIAGNOSIS — H4011X1 Primary open-angle glaucoma, mild stage: Secondary | ICD-10-CM | POA: Diagnosis not present

## 2014-09-15 DIAGNOSIS — H5310 Unspecified subjective visual disturbances: Secondary | ICD-10-CM | POA: Diagnosis not present

## 2014-12-25 DIAGNOSIS — R7302 Impaired glucose tolerance (oral): Secondary | ICD-10-CM | POA: Diagnosis not present

## 2014-12-25 DIAGNOSIS — R809 Proteinuria, unspecified: Secondary | ICD-10-CM | POA: Diagnosis not present

## 2014-12-25 DIAGNOSIS — N182 Chronic kidney disease, stage 2 (mild): Secondary | ICD-10-CM | POA: Diagnosis not present

## 2014-12-25 DIAGNOSIS — H9190 Unspecified hearing loss, unspecified ear: Secondary | ICD-10-CM | POA: Diagnosis not present

## 2014-12-25 DIAGNOSIS — K589 Irritable bowel syndrome without diarrhea: Secondary | ICD-10-CM | POA: Diagnosis not present

## 2014-12-25 DIAGNOSIS — I10 Essential (primary) hypertension: Secondary | ICD-10-CM | POA: Diagnosis not present

## 2014-12-25 DIAGNOSIS — F419 Anxiety disorder, unspecified: Secondary | ICD-10-CM | POA: Diagnosis not present

## 2014-12-25 DIAGNOSIS — Z6823 Body mass index (BMI) 23.0-23.9, adult: Secondary | ICD-10-CM | POA: Diagnosis not present

## 2014-12-25 DIAGNOSIS — E785 Hyperlipidemia, unspecified: Secondary | ICD-10-CM | POA: Diagnosis not present

## 2015-02-21 DIAGNOSIS — Z0100 Encounter for examination of eyes and vision without abnormal findings: Secondary | ICD-10-CM | POA: Diagnosis not present

## 2015-02-21 DIAGNOSIS — H4011X1 Primary open-angle glaucoma, mild stage: Secondary | ICD-10-CM | POA: Diagnosis not present

## 2015-03-07 ENCOUNTER — Other Ambulatory Visit: Payer: Self-pay | Admitting: Obstetrics & Gynecology

## 2015-03-07 DIAGNOSIS — Z1272 Encounter for screening for malignant neoplasm of vagina: Secondary | ICD-10-CM | POA: Diagnosis not present

## 2015-03-07 DIAGNOSIS — Z90710 Acquired absence of both cervix and uterus: Secondary | ICD-10-CM | POA: Diagnosis not present

## 2015-03-07 DIAGNOSIS — Z6824 Body mass index (BMI) 24.0-24.9, adult: Secondary | ICD-10-CM | POA: Diagnosis not present

## 2015-03-07 DIAGNOSIS — Z124 Encounter for screening for malignant neoplasm of cervix: Secondary | ICD-10-CM | POA: Diagnosis not present

## 2015-03-08 DIAGNOSIS — H5711 Ocular pain, right eye: Secondary | ICD-10-CM | POA: Diagnosis not present

## 2015-03-08 LAB — CYTOLOGY - PAP

## 2015-03-21 DIAGNOSIS — J00 Acute nasopharyngitis [common cold]: Secondary | ICD-10-CM | POA: Diagnosis not present

## 2015-03-21 DIAGNOSIS — S0993XD Unspecified injury of face, subsequent encounter: Secondary | ICD-10-CM | POA: Diagnosis not present

## 2015-03-21 DIAGNOSIS — I10 Essential (primary) hypertension: Secondary | ICD-10-CM | POA: Diagnosis not present

## 2015-03-21 DIAGNOSIS — K219 Gastro-esophageal reflux disease without esophagitis: Secondary | ICD-10-CM | POA: Diagnosis not present

## 2015-03-21 DIAGNOSIS — F411 Generalized anxiety disorder: Secondary | ICD-10-CM | POA: Diagnosis not present

## 2015-03-21 DIAGNOSIS — K589 Irritable bowel syndrome without diarrhea: Secondary | ICD-10-CM | POA: Diagnosis not present

## 2015-03-21 DIAGNOSIS — M542 Cervicalgia: Secondary | ICD-10-CM | POA: Diagnosis not present

## 2015-05-23 DIAGNOSIS — Z1231 Encounter for screening mammogram for malignant neoplasm of breast: Secondary | ICD-10-CM | POA: Diagnosis not present

## 2015-06-22 DIAGNOSIS — I10 Essential (primary) hypertension: Secondary | ICD-10-CM | POA: Diagnosis not present

## 2015-06-29 DIAGNOSIS — F411 Generalized anxiety disorder: Secondary | ICD-10-CM | POA: Diagnosis not present

## 2015-06-29 DIAGNOSIS — N183 Chronic kidney disease, stage 3 (moderate): Secondary | ICD-10-CM | POA: Diagnosis not present

## 2015-06-29 DIAGNOSIS — Z23 Encounter for immunization: Secondary | ICD-10-CM | POA: Diagnosis not present

## 2015-06-29 DIAGNOSIS — I10 Essential (primary) hypertension: Secondary | ICD-10-CM | POA: Diagnosis not present

## 2015-06-29 DIAGNOSIS — Z0000 Encounter for general adult medical examination without abnormal findings: Secondary | ICD-10-CM | POA: Diagnosis not present

## 2015-06-29 DIAGNOSIS — Z1389 Encounter for screening for other disorder: Secondary | ICD-10-CM | POA: Diagnosis not present

## 2015-06-29 DIAGNOSIS — E785 Hyperlipidemia, unspecified: Secondary | ICD-10-CM | POA: Diagnosis not present

## 2015-06-29 DIAGNOSIS — M8588 Other specified disorders of bone density and structure, other site: Secondary | ICD-10-CM | POA: Diagnosis not present

## 2015-08-15 DIAGNOSIS — M859 Disorder of bone density and structure, unspecified: Secondary | ICD-10-CM | POA: Diagnosis not present

## 2015-08-15 DIAGNOSIS — M8589 Other specified disorders of bone density and structure, multiple sites: Secondary | ICD-10-CM | POA: Diagnosis not present

## 2015-09-03 DIAGNOSIS — H401112 Primary open-angle glaucoma, right eye, moderate stage: Secondary | ICD-10-CM | POA: Diagnosis not present

## 2015-09-03 DIAGNOSIS — H401111 Primary open-angle glaucoma, right eye, mild stage: Secondary | ICD-10-CM | POA: Diagnosis not present

## 2015-10-08 DIAGNOSIS — J329 Chronic sinusitis, unspecified: Secondary | ICD-10-CM | POA: Diagnosis not present

## 2015-11-13 ENCOUNTER — Ambulatory Visit
Admission: RE | Admit: 2015-11-13 | Discharge: 2015-11-13 | Disposition: A | Discharge status: Home / Self Care | Payer: Medicare Other | Source: Ambulatory Visit | Attending: Internal Medicine | Admitting: Internal Medicine

## 2015-11-13 ENCOUNTER — Other Ambulatory Visit: Payer: Self-pay | Admitting: Internal Medicine

## 2015-11-13 DIAGNOSIS — R51 Headache: Secondary | ICD-10-CM | POA: Diagnosis not present

## 2015-11-13 DIAGNOSIS — I10 Essential (primary) hypertension: Secondary | ICD-10-CM | POA: Diagnosis not present

## 2015-11-13 DIAGNOSIS — R42 Dizziness and giddiness: Secondary | ICD-10-CM | POA: Diagnosis not present

## 2015-11-13 DIAGNOSIS — R519 Headache, unspecified: Secondary | ICD-10-CM

## 2015-11-14 DIAGNOSIS — R42 Dizziness and giddiness: Secondary | ICD-10-CM | POA: Diagnosis not present

## 2015-11-14 DIAGNOSIS — I10 Essential (primary) hypertension: Secondary | ICD-10-CM | POA: Diagnosis not present

## 2015-11-19 DIAGNOSIS — H10413 Chronic giant papillary conjunctivitis, bilateral: Secondary | ICD-10-CM | POA: Diagnosis not present

## 2015-12-28 DIAGNOSIS — I10 Essential (primary) hypertension: Secondary | ICD-10-CM | POA: Diagnosis not present

## 2016-02-08 DIAGNOSIS — H401131 Primary open-angle glaucoma, bilateral, mild stage: Secondary | ICD-10-CM | POA: Diagnosis not present

## 2016-03-05 DIAGNOSIS — M9901 Segmental and somatic dysfunction of cervical region: Secondary | ICD-10-CM | POA: Diagnosis not present

## 2016-03-05 DIAGNOSIS — M62830 Muscle spasm of back: Secondary | ICD-10-CM | POA: Diagnosis not present

## 2016-03-05 DIAGNOSIS — R51 Headache: Secondary | ICD-10-CM | POA: Diagnosis not present

## 2016-03-05 DIAGNOSIS — M9903 Segmental and somatic dysfunction of lumbar region: Secondary | ICD-10-CM | POA: Diagnosis not present

## 2016-03-05 DIAGNOSIS — M542 Cervicalgia: Secondary | ICD-10-CM | POA: Diagnosis not present

## 2016-03-05 DIAGNOSIS — M545 Low back pain: Secondary | ICD-10-CM | POA: Diagnosis not present

## 2016-03-05 DIAGNOSIS — M9900 Segmental and somatic dysfunction of head region: Secondary | ICD-10-CM | POA: Diagnosis not present

## 2016-03-06 DIAGNOSIS — M9901 Segmental and somatic dysfunction of cervical region: Secondary | ICD-10-CM | POA: Diagnosis not present

## 2016-03-06 DIAGNOSIS — M62830 Muscle spasm of back: Secondary | ICD-10-CM | POA: Diagnosis not present

## 2016-03-06 DIAGNOSIS — M9903 Segmental and somatic dysfunction of lumbar region: Secondary | ICD-10-CM | POA: Diagnosis not present

## 2016-03-06 DIAGNOSIS — M542 Cervicalgia: Secondary | ICD-10-CM | POA: Diagnosis not present

## 2016-03-06 DIAGNOSIS — M9900 Segmental and somatic dysfunction of head region: Secondary | ICD-10-CM | POA: Diagnosis not present

## 2016-03-06 DIAGNOSIS — M545 Low back pain: Secondary | ICD-10-CM | POA: Diagnosis not present

## 2016-03-06 DIAGNOSIS — R51 Headache: Secondary | ICD-10-CM | POA: Diagnosis not present

## 2016-03-10 DIAGNOSIS — Z6824 Body mass index (BMI) 24.0-24.9, adult: Secondary | ICD-10-CM | POA: Diagnosis not present

## 2016-03-10 DIAGNOSIS — H6123 Impacted cerumen, bilateral: Secondary | ICD-10-CM | POA: Diagnosis not present

## 2016-03-10 DIAGNOSIS — R351 Nocturia: Secondary | ICD-10-CM | POA: Diagnosis not present

## 2016-03-10 DIAGNOSIS — Z01419 Encounter for gynecological examination (general) (routine) without abnormal findings: Secondary | ICD-10-CM | POA: Diagnosis not present

## 2016-04-03 DIAGNOSIS — M542 Cervicalgia: Secondary | ICD-10-CM | POA: Diagnosis not present

## 2016-04-03 DIAGNOSIS — M50821 Other cervical disc disorders at C4-C5 level: Secondary | ICD-10-CM | POA: Diagnosis not present

## 2016-04-03 DIAGNOSIS — M50822 Other cervical disc disorders at C5-C6 level: Secondary | ICD-10-CM | POA: Diagnosis not present

## 2016-04-18 DIAGNOSIS — M542 Cervicalgia: Secondary | ICD-10-CM | POA: Diagnosis not present

## 2016-04-23 DIAGNOSIS — M542 Cervicalgia: Secondary | ICD-10-CM | POA: Diagnosis not present

## 2016-04-25 DIAGNOSIS — M542 Cervicalgia: Secondary | ICD-10-CM | POA: Diagnosis not present

## 2016-04-30 DIAGNOSIS — M542 Cervicalgia: Secondary | ICD-10-CM | POA: Diagnosis not present

## 2016-05-07 DIAGNOSIS — Z23 Encounter for immunization: Secondary | ICD-10-CM | POA: Diagnosis not present

## 2016-05-07 DIAGNOSIS — M542 Cervicalgia: Secondary | ICD-10-CM | POA: Diagnosis not present

## 2016-05-09 DIAGNOSIS — M542 Cervicalgia: Secondary | ICD-10-CM | POA: Diagnosis not present

## 2016-05-14 DIAGNOSIS — M542 Cervicalgia: Secondary | ICD-10-CM | POA: Diagnosis not present

## 2016-05-21 DIAGNOSIS — M542 Cervicalgia: Secondary | ICD-10-CM | POA: Diagnosis not present

## 2016-05-28 DIAGNOSIS — M542 Cervicalgia: Secondary | ICD-10-CM | POA: Diagnosis not present

## 2016-06-04 DIAGNOSIS — M542 Cervicalgia: Secondary | ICD-10-CM | POA: Diagnosis not present

## 2016-06-27 DIAGNOSIS — Z1231 Encounter for screening mammogram for malignant neoplasm of breast: Secondary | ICD-10-CM | POA: Diagnosis not present

## 2016-07-16 DIAGNOSIS — F411 Generalized anxiety disorder: Secondary | ICD-10-CM | POA: Diagnosis not present

## 2016-07-16 DIAGNOSIS — K219 Gastro-esophageal reflux disease without esophagitis: Secondary | ICD-10-CM | POA: Diagnosis not present

## 2016-07-16 DIAGNOSIS — Z1389 Encounter for screening for other disorder: Secondary | ICD-10-CM | POA: Diagnosis not present

## 2016-07-16 DIAGNOSIS — N183 Chronic kidney disease, stage 3 (moderate): Secondary | ICD-10-CM | POA: Diagnosis not present

## 2016-07-16 DIAGNOSIS — E559 Vitamin D deficiency, unspecified: Secondary | ICD-10-CM | POA: Diagnosis not present

## 2016-07-16 DIAGNOSIS — I129 Hypertensive chronic kidney disease with stage 1 through stage 4 chronic kidney disease, or unspecified chronic kidney disease: Secondary | ICD-10-CM | POA: Diagnosis not present

## 2016-07-16 DIAGNOSIS — E782 Mixed hyperlipidemia: Secondary | ICD-10-CM | POA: Diagnosis not present

## 2016-07-16 DIAGNOSIS — Z79899 Other long term (current) drug therapy: Secondary | ICD-10-CM | POA: Diagnosis not present

## 2016-07-16 DIAGNOSIS — Z0000 Encounter for general adult medical examination without abnormal findings: Secondary | ICD-10-CM | POA: Diagnosis not present

## 2016-08-05 DIAGNOSIS — B9789 Other viral agents as the cause of diseases classified elsewhere: Secondary | ICD-10-CM | POA: Diagnosis not present

## 2016-08-05 DIAGNOSIS — J069 Acute upper respiratory infection, unspecified: Secondary | ICD-10-CM | POA: Diagnosis not present

## 2016-09-05 DIAGNOSIS — K5792 Diverticulitis of intestine, part unspecified, without perforation or abscess without bleeding: Secondary | ICD-10-CM | POA: Diagnosis not present

## 2016-09-18 DIAGNOSIS — Z961 Presence of intraocular lens: Secondary | ICD-10-CM | POA: Diagnosis not present

## 2016-09-18 DIAGNOSIS — H401131 Primary open-angle glaucoma, bilateral, mild stage: Secondary | ICD-10-CM | POA: Diagnosis not present

## 2016-10-06 DIAGNOSIS — R300 Dysuria: Secondary | ICD-10-CM | POA: Diagnosis not present

## 2016-10-07 ENCOUNTER — Other Ambulatory Visit: Payer: Self-pay | Admitting: Geriatric Medicine

## 2016-10-07 DIAGNOSIS — R1031 Right lower quadrant pain: Secondary | ICD-10-CM

## 2016-10-07 DIAGNOSIS — R102 Pelvic and perineal pain: Secondary | ICD-10-CM | POA: Diagnosis not present

## 2016-10-07 DIAGNOSIS — N952 Postmenopausal atrophic vaginitis: Secondary | ICD-10-CM | POA: Diagnosis not present

## 2016-10-07 DIAGNOSIS — D509 Iron deficiency anemia, unspecified: Secondary | ICD-10-CM | POA: Diagnosis not present

## 2016-10-10 ENCOUNTER — Ambulatory Visit
Admission: RE | Admit: 2016-10-10 | Discharge: 2016-10-10 | Disposition: A | Discharge status: Home / Self Care | Payer: Medicare Other | Source: Ambulatory Visit | Attending: Geriatric Medicine | Admitting: Geriatric Medicine

## 2016-10-10 DIAGNOSIS — R1031 Right lower quadrant pain: Secondary | ICD-10-CM

## 2016-10-10 DIAGNOSIS — N200 Calculus of kidney: Secondary | ICD-10-CM | POA: Diagnosis not present

## 2016-10-10 MED ORDER — IOPAMIDOL (ISOVUE-300) INJECTION 61%
60.0000 mL | Freq: Once | INTRAVENOUS | Status: AC | PRN
  Administered 2016-10-10: 60 mL via INTRAVENOUS

## 2016-10-20 DIAGNOSIS — D649 Anemia, unspecified: Secondary | ICD-10-CM | POA: Diagnosis not present

## 2016-10-21 DIAGNOSIS — D649 Anemia, unspecified: Secondary | ICD-10-CM | POA: Diagnosis not present

## 2016-10-31 DIAGNOSIS — N760 Acute vaginitis: Secondary | ICD-10-CM | POA: Diagnosis not present

## 2017-01-16 DIAGNOSIS — D508 Other iron deficiency anemias: Secondary | ICD-10-CM | POA: Diagnosis not present

## 2017-01-16 DIAGNOSIS — H9041 Sensorineural hearing loss, unilateral, right ear, with unrestricted hearing on the contralateral side: Secondary | ICD-10-CM | POA: Diagnosis not present

## 2017-01-16 DIAGNOSIS — N183 Chronic kidney disease, stage 3 (moderate): Secondary | ICD-10-CM | POA: Diagnosis not present

## 2017-01-16 DIAGNOSIS — I129 Hypertensive chronic kidney disease with stage 1 through stage 4 chronic kidney disease, or unspecified chronic kidney disease: Secondary | ICD-10-CM | POA: Diagnosis not present

## 2017-01-19 DIAGNOSIS — H6123 Impacted cerumen, bilateral: Secondary | ICD-10-CM | POA: Diagnosis not present

## 2017-01-22 DIAGNOSIS — H401131 Primary open-angle glaucoma, bilateral, mild stage: Secondary | ICD-10-CM | POA: Diagnosis not present

## 2017-03-11 DIAGNOSIS — Z124 Encounter for screening for malignant neoplasm of cervix: Secondary | ICD-10-CM | POA: Diagnosis not present

## 2017-03-11 DIAGNOSIS — Z1272 Encounter for screening for malignant neoplasm of vagina: Secondary | ICD-10-CM | POA: Diagnosis not present

## 2017-03-11 DIAGNOSIS — Z6824 Body mass index (BMI) 24.0-24.9, adult: Secondary | ICD-10-CM | POA: Diagnosis not present

## 2017-05-13 DIAGNOSIS — Z23 Encounter for immunization: Secondary | ICD-10-CM | POA: Diagnosis not present

## 2017-07-08 DIAGNOSIS — Z1231 Encounter for screening mammogram for malignant neoplasm of breast: Secondary | ICD-10-CM | POA: Diagnosis not present

## 2017-07-15 DIAGNOSIS — N6311 Unspecified lump in the right breast, upper outer quadrant: Secondary | ICD-10-CM | POA: Diagnosis not present

## 2017-07-15 DIAGNOSIS — R922 Inconclusive mammogram: Secondary | ICD-10-CM | POA: Diagnosis not present

## 2017-08-12 DIAGNOSIS — Z1389 Encounter for screening for other disorder: Secondary | ICD-10-CM | POA: Diagnosis not present

## 2017-08-12 DIAGNOSIS — I700 Atherosclerosis of aorta: Secondary | ICD-10-CM | POA: Diagnosis not present

## 2017-08-12 DIAGNOSIS — D508 Other iron deficiency anemias: Secondary | ICD-10-CM | POA: Diagnosis not present

## 2017-08-12 DIAGNOSIS — Z79899 Other long term (current) drug therapy: Secondary | ICD-10-CM | POA: Diagnosis not present

## 2017-08-12 DIAGNOSIS — Z0000 Encounter for general adult medical examination without abnormal findings: Secondary | ICD-10-CM | POA: Diagnosis not present

## 2017-08-12 DIAGNOSIS — I129 Hypertensive chronic kidney disease with stage 1 through stage 4 chronic kidney disease, or unspecified chronic kidney disease: Secondary | ICD-10-CM | POA: Diagnosis not present

## 2017-08-12 DIAGNOSIS — E782 Mixed hyperlipidemia: Secondary | ICD-10-CM | POA: Diagnosis not present

## 2017-08-12 DIAGNOSIS — N183 Chronic kidney disease, stage 3 (moderate): Secondary | ICD-10-CM | POA: Diagnosis not present

## 2017-12-30 DIAGNOSIS — H401131 Primary open-angle glaucoma, bilateral, mild stage: Secondary | ICD-10-CM | POA: Diagnosis not present

## 2018-01-14 DIAGNOSIS — R928 Other abnormal and inconclusive findings on diagnostic imaging of breast: Secondary | ICD-10-CM | POA: Diagnosis not present

## 2018-01-14 DIAGNOSIS — N6311 Unspecified lump in the right breast, upper outer quadrant: Secondary | ICD-10-CM | POA: Diagnosis not present

## 2018-02-17 DIAGNOSIS — Z79899 Other long term (current) drug therapy: Secondary | ICD-10-CM | POA: Diagnosis not present

## 2018-02-17 DIAGNOSIS — N183 Chronic kidney disease, stage 3 (moderate): Secondary | ICD-10-CM | POA: Diagnosis not present

## 2018-02-17 DIAGNOSIS — I129 Hypertensive chronic kidney disease with stage 1 through stage 4 chronic kidney disease, or unspecified chronic kidney disease: Secondary | ICD-10-CM | POA: Diagnosis not present

## 2018-05-20 DIAGNOSIS — Z23 Encounter for immunization: Secondary | ICD-10-CM | POA: Diagnosis not present

## 2018-05-20 DIAGNOSIS — N183 Chronic kidney disease, stage 3 (moderate): Secondary | ICD-10-CM | POA: Diagnosis not present

## 2018-05-20 DIAGNOSIS — K219 Gastro-esophageal reflux disease without esophagitis: Secondary | ICD-10-CM | POA: Diagnosis not present

## 2018-05-20 DIAGNOSIS — I129 Hypertensive chronic kidney disease with stage 1 through stage 4 chronic kidney disease, or unspecified chronic kidney disease: Secondary | ICD-10-CM | POA: Diagnosis not present

## 2018-06-30 DIAGNOSIS — H401131 Primary open-angle glaucoma, bilateral, mild stage: Secondary | ICD-10-CM | POA: Diagnosis not present

## 2018-06-30 DIAGNOSIS — H52203 Unspecified astigmatism, bilateral: Secondary | ICD-10-CM | POA: Diagnosis not present

## 2018-06-30 DIAGNOSIS — H524 Presbyopia: Secondary | ICD-10-CM | POA: Diagnosis not present

## 2018-07-08 DIAGNOSIS — Z09 Encounter for follow-up examination after completed treatment for conditions other than malignant neoplasm: Secondary | ICD-10-CM | POA: Diagnosis not present

## 2018-07-08 DIAGNOSIS — R928 Other abnormal and inconclusive findings on diagnostic imaging of breast: Secondary | ICD-10-CM | POA: Diagnosis not present

## 2018-08-25 DIAGNOSIS — Z1389 Encounter for screening for other disorder: Secondary | ICD-10-CM | POA: Diagnosis not present

## 2018-08-25 DIAGNOSIS — M8588 Other specified disorders of bone density and structure, other site: Secondary | ICD-10-CM | POA: Diagnosis not present

## 2018-08-25 DIAGNOSIS — N183 Chronic kidney disease, stage 3 (moderate): Secondary | ICD-10-CM | POA: Diagnosis not present

## 2018-08-25 DIAGNOSIS — Z0000 Encounter for general adult medical examination without abnormal findings: Secondary | ICD-10-CM | POA: Diagnosis not present

## 2018-08-25 DIAGNOSIS — I700 Atherosclerosis of aorta: Secondary | ICD-10-CM | POA: Diagnosis not present

## 2018-08-25 DIAGNOSIS — E7800 Pure hypercholesterolemia, unspecified: Secondary | ICD-10-CM | POA: Diagnosis not present

## 2018-08-25 DIAGNOSIS — E785 Hyperlipidemia, unspecified: Secondary | ICD-10-CM | POA: Diagnosis not present

## 2018-08-25 DIAGNOSIS — Z79899 Other long term (current) drug therapy: Secondary | ICD-10-CM | POA: Diagnosis not present

## 2018-08-25 DIAGNOSIS — I129 Hypertensive chronic kidney disease with stage 1 through stage 4 chronic kidney disease, or unspecified chronic kidney disease: Secondary | ICD-10-CM | POA: Diagnosis not present

## 2018-08-25 DIAGNOSIS — K219 Gastro-esophageal reflux disease without esophagitis: Secondary | ICD-10-CM | POA: Diagnosis not present

## 2018-09-01 DIAGNOSIS — Z90710 Acquired absence of both cervix and uterus: Secondary | ICD-10-CM | POA: Diagnosis not present

## 2018-09-01 DIAGNOSIS — M47816 Spondylosis without myelopathy or radiculopathy, lumbar region: Secondary | ICD-10-CM | POA: Diagnosis not present

## 2018-09-01 DIAGNOSIS — Z780 Asymptomatic menopausal state: Secondary | ICD-10-CM | POA: Diagnosis not present

## 2018-09-01 DIAGNOSIS — M8589 Other specified disorders of bone density and structure, multiple sites: Secondary | ICD-10-CM | POA: Diagnosis not present

## 2018-09-01 DIAGNOSIS — M419 Scoliosis, unspecified: Secondary | ICD-10-CM | POA: Diagnosis not present

## 2018-09-07 DIAGNOSIS — N183 Chronic kidney disease, stage 3 (moderate): Secondary | ICD-10-CM | POA: Diagnosis not present

## 2018-09-07 DIAGNOSIS — I129 Hypertensive chronic kidney disease with stage 1 through stage 4 chronic kidney disease, or unspecified chronic kidney disease: Secondary | ICD-10-CM | POA: Diagnosis not present

## 2018-09-07 DIAGNOSIS — M85851 Other specified disorders of bone density and structure, right thigh: Secondary | ICD-10-CM | POA: Diagnosis not present

## 2018-09-07 DIAGNOSIS — M85852 Other specified disorders of bone density and structure, left thigh: Secondary | ICD-10-CM | POA: Diagnosis not present

## 2018-12-30 DIAGNOSIS — H401131 Primary open-angle glaucoma, bilateral, mild stage: Secondary | ICD-10-CM | POA: Diagnosis not present

## 2018-12-30 DIAGNOSIS — Z961 Presence of intraocular lens: Secondary | ICD-10-CM | POA: Diagnosis not present

## 2018-12-30 DIAGNOSIS — H524 Presbyopia: Secondary | ICD-10-CM | POA: Diagnosis not present

## 2019-01-17 DIAGNOSIS — I129 Hypertensive chronic kidney disease with stage 1 through stage 4 chronic kidney disease, or unspecified chronic kidney disease: Secondary | ICD-10-CM | POA: Diagnosis not present

## 2019-01-17 DIAGNOSIS — E782 Mixed hyperlipidemia: Secondary | ICD-10-CM | POA: Diagnosis not present

## 2019-01-17 DIAGNOSIS — D508 Other iron deficiency anemias: Secondary | ICD-10-CM | POA: Diagnosis not present

## 2019-01-17 DIAGNOSIS — E785 Hyperlipidemia, unspecified: Secondary | ICD-10-CM | POA: Diagnosis not present

## 2019-01-17 DIAGNOSIS — N183 Chronic kidney disease, stage 3 (moderate): Secondary | ICD-10-CM | POA: Diagnosis not present

## 2019-01-24 DIAGNOSIS — E782 Mixed hyperlipidemia: Secondary | ICD-10-CM | POA: Diagnosis not present

## 2019-01-24 DIAGNOSIS — I129 Hypertensive chronic kidney disease with stage 1 through stage 4 chronic kidney disease, or unspecified chronic kidney disease: Secondary | ICD-10-CM | POA: Diagnosis not present

## 2019-01-24 DIAGNOSIS — D508 Other iron deficiency anemias: Secondary | ICD-10-CM | POA: Diagnosis not present

## 2019-01-24 DIAGNOSIS — N183 Chronic kidney disease, stage 3 (moderate): Secondary | ICD-10-CM | POA: Diagnosis not present

## 2019-01-24 DIAGNOSIS — E785 Hyperlipidemia, unspecified: Secondary | ICD-10-CM | POA: Diagnosis not present

## 2019-03-01 DIAGNOSIS — M8588 Other specified disorders of bone density and structure, other site: Secondary | ICD-10-CM | POA: Diagnosis not present

## 2019-03-01 DIAGNOSIS — N183 Chronic kidney disease, stage 3 (moderate): Secondary | ICD-10-CM | POA: Diagnosis not present

## 2019-03-01 DIAGNOSIS — K219 Gastro-esophageal reflux disease without esophagitis: Secondary | ICD-10-CM | POA: Diagnosis not present

## 2019-03-01 DIAGNOSIS — Z79899 Other long term (current) drug therapy: Secondary | ICD-10-CM | POA: Diagnosis not present

## 2019-03-01 DIAGNOSIS — I129 Hypertensive chronic kidney disease with stage 1 through stage 4 chronic kidney disease, or unspecified chronic kidney disease: Secondary | ICD-10-CM | POA: Diagnosis not present

## 2019-03-03 DIAGNOSIS — Z79899 Other long term (current) drug therapy: Secondary | ICD-10-CM | POA: Diagnosis not present

## 2019-03-07 DIAGNOSIS — E782 Mixed hyperlipidemia: Secondary | ICD-10-CM | POA: Diagnosis not present

## 2019-03-07 DIAGNOSIS — E785 Hyperlipidemia, unspecified: Secondary | ICD-10-CM | POA: Diagnosis not present

## 2019-03-07 DIAGNOSIS — N183 Chronic kidney disease, stage 3 (moderate): Secondary | ICD-10-CM | POA: Diagnosis not present

## 2019-03-07 DIAGNOSIS — I129 Hypertensive chronic kidney disease with stage 1 through stage 4 chronic kidney disease, or unspecified chronic kidney disease: Secondary | ICD-10-CM | POA: Diagnosis not present

## 2019-03-07 DIAGNOSIS — D508 Other iron deficiency anemias: Secondary | ICD-10-CM | POA: Diagnosis not present

## 2019-03-24 DIAGNOSIS — N183 Chronic kidney disease, stage 3 (moderate): Secondary | ICD-10-CM | POA: Diagnosis not present

## 2019-03-24 DIAGNOSIS — I129 Hypertensive chronic kidney disease with stage 1 through stage 4 chronic kidney disease, or unspecified chronic kidney disease: Secondary | ICD-10-CM | POA: Diagnosis not present

## 2019-03-24 DIAGNOSIS — D508 Other iron deficiency anemias: Secondary | ICD-10-CM | POA: Diagnosis not present

## 2019-03-24 DIAGNOSIS — E785 Hyperlipidemia, unspecified: Secondary | ICD-10-CM | POA: Diagnosis not present

## 2019-03-24 DIAGNOSIS — E782 Mixed hyperlipidemia: Secondary | ICD-10-CM | POA: Diagnosis not present

## 2019-04-06 DIAGNOSIS — I129 Hypertensive chronic kidney disease with stage 1 through stage 4 chronic kidney disease, or unspecified chronic kidney disease: Secondary | ICD-10-CM | POA: Diagnosis not present

## 2019-04-06 DIAGNOSIS — N183 Chronic kidney disease, stage 3 (moderate): Secondary | ICD-10-CM | POA: Diagnosis not present

## 2019-04-26 DIAGNOSIS — I129 Hypertensive chronic kidney disease with stage 1 through stage 4 chronic kidney disease, or unspecified chronic kidney disease: Secondary | ICD-10-CM | POA: Diagnosis not present

## 2019-04-26 DIAGNOSIS — E782 Mixed hyperlipidemia: Secondary | ICD-10-CM | POA: Diagnosis not present

## 2019-04-26 DIAGNOSIS — D508 Other iron deficiency anemias: Secondary | ICD-10-CM | POA: Diagnosis not present

## 2019-04-26 DIAGNOSIS — E7800 Pure hypercholesterolemia, unspecified: Secondary | ICD-10-CM | POA: Diagnosis not present

## 2019-04-26 DIAGNOSIS — E785 Hyperlipidemia, unspecified: Secondary | ICD-10-CM | POA: Diagnosis not present

## 2019-04-26 DIAGNOSIS — N1831 Chronic kidney disease, stage 3a: Secondary | ICD-10-CM | POA: Diagnosis not present

## 2019-05-31 DIAGNOSIS — Z23 Encounter for immunization: Secondary | ICD-10-CM | POA: Diagnosis not present

## 2019-06-30 DIAGNOSIS — H401131 Primary open-angle glaucoma, bilateral, mild stage: Secondary | ICD-10-CM | POA: Diagnosis not present

## 2019-08-09 DIAGNOSIS — E785 Hyperlipidemia, unspecified: Secondary | ICD-10-CM | POA: Diagnosis not present

## 2019-08-09 DIAGNOSIS — E7800 Pure hypercholesterolemia, unspecified: Secondary | ICD-10-CM | POA: Diagnosis not present

## 2019-08-09 DIAGNOSIS — I129 Hypertensive chronic kidney disease with stage 1 through stage 4 chronic kidney disease, or unspecified chronic kidney disease: Secondary | ICD-10-CM | POA: Diagnosis not present

## 2019-08-09 DIAGNOSIS — N183 Chronic kidney disease, stage 3 unspecified: Secondary | ICD-10-CM | POA: Diagnosis not present

## 2019-08-09 DIAGNOSIS — D508 Other iron deficiency anemias: Secondary | ICD-10-CM | POA: Diagnosis not present

## 2019-08-09 DIAGNOSIS — E782 Mixed hyperlipidemia: Secondary | ICD-10-CM | POA: Diagnosis not present

## 2019-08-17 DIAGNOSIS — R079 Chest pain, unspecified: Secondary | ICD-10-CM | POA: Diagnosis not present

## 2019-08-17 DIAGNOSIS — I129 Hypertensive chronic kidney disease with stage 1 through stage 4 chronic kidney disease, or unspecified chronic kidney disease: Secondary | ICD-10-CM | POA: Diagnosis not present

## 2019-08-17 DIAGNOSIS — I447 Left bundle-branch block, unspecified: Secondary | ICD-10-CM | POA: Diagnosis not present

## 2019-08-17 DIAGNOSIS — N1831 Chronic kidney disease, stage 3a: Secondary | ICD-10-CM | POA: Diagnosis not present

## 2019-08-18 ENCOUNTER — Other Ambulatory Visit (HOSPITAL_COMMUNITY): Payer: Self-pay | Admitting: Geriatric Medicine

## 2019-08-18 DIAGNOSIS — R079 Chest pain, unspecified: Secondary | ICD-10-CM

## 2019-08-25 ENCOUNTER — Telehealth (HOSPITAL_COMMUNITY): Payer: Self-pay | Admitting: *Deleted

## 2019-08-25 NOTE — Telephone Encounter (Signed)
Patient given detailed instructions per Myocardial Perfusion Study Information Sheet for the test on 08/29/19 Patient notified to arrive 15 minutes early and that it is imperative to arrive on time for appointment to keep from having the test rescheduled.  If you need to cancel or reschedule your appointment, please call the office within 24 hours of your appointment. . Patient verbalized understanding. Patricia Garcia [name]    

## 2019-08-29 ENCOUNTER — Other Ambulatory Visit: Payer: Self-pay

## 2019-08-29 ENCOUNTER — Ambulatory Visit (HOSPITAL_COMMUNITY): Payer: Medicare Other | Attending: Cardiovascular Disease

## 2019-08-29 DIAGNOSIS — R079 Chest pain, unspecified: Secondary | ICD-10-CM | POA: Insufficient documentation

## 2019-08-29 LAB — MYOCARDIAL PERFUSION IMAGING
LV dias vol: 72 mL (ref 46–106)
LV sys vol: 39 mL
Peak HR: 115 {beats}/min
Rest HR: 66 {beats}/min
SDS: 4
SRS: 0
SSS: 4
TID: 1.17

## 2019-08-29 MED ORDER — REGADENOSON 0.4 MG/5ML IV SOLN
0.4000 mg | Freq: Once | INTRAVENOUS | Status: AC
  Administered 2019-08-29: 0.4 mg via INTRAVENOUS

## 2019-08-29 MED ORDER — TECHNETIUM TC 99M TETROFOSMIN IV KIT
11.0000 | PACK | Freq: Once | INTRAVENOUS | Status: AC | PRN
  Administered 2019-08-29: 11 via INTRAVENOUS
  Filled 2019-08-29: qty 11

## 2019-08-29 MED ORDER — TECHNETIUM TC 99M TETROFOSMIN IV KIT
32.4000 | PACK | Freq: Once | INTRAVENOUS | Status: AC | PRN
  Administered 2019-08-29: 32.4 via INTRAVENOUS
  Filled 2019-08-29: qty 33

## 2019-09-02 DIAGNOSIS — Z1331 Encounter for screening for depression: Secondary | ICD-10-CM | POA: Diagnosis not present

## 2019-09-02 DIAGNOSIS — Z0000 Encounter for general adult medical examination without abnormal findings: Secondary | ICD-10-CM | POA: Diagnosis not present

## 2019-09-02 DIAGNOSIS — I700 Atherosclerosis of aorta: Secondary | ICD-10-CM | POA: Diagnosis not present

## 2019-09-02 DIAGNOSIS — N1831 Chronic kidney disease, stage 3a: Secondary | ICD-10-CM | POA: Diagnosis not present

## 2019-09-02 DIAGNOSIS — K219 Gastro-esophageal reflux disease without esophagitis: Secondary | ICD-10-CM | POA: Diagnosis not present

## 2019-09-02 DIAGNOSIS — K9089 Other intestinal malabsorption: Secondary | ICD-10-CM | POA: Diagnosis not present

## 2019-09-02 DIAGNOSIS — E785 Hyperlipidemia, unspecified: Secondary | ICD-10-CM | POA: Diagnosis not present

## 2019-09-02 DIAGNOSIS — Z1389 Encounter for screening for other disorder: Secondary | ICD-10-CM | POA: Diagnosis not present

## 2019-09-02 DIAGNOSIS — I129 Hypertensive chronic kidney disease with stage 1 through stage 4 chronic kidney disease, or unspecified chronic kidney disease: Secondary | ICD-10-CM | POA: Diagnosis not present

## 2019-09-02 DIAGNOSIS — Z79899 Other long term (current) drug therapy: Secondary | ICD-10-CM | POA: Diagnosis not present

## 2019-09-02 DIAGNOSIS — E559 Vitamin D deficiency, unspecified: Secondary | ICD-10-CM | POA: Diagnosis not present

## 2019-09-02 DIAGNOSIS — F411 Generalized anxiety disorder: Secondary | ICD-10-CM | POA: Diagnosis not present

## 2019-09-13 DIAGNOSIS — E785 Hyperlipidemia, unspecified: Secondary | ICD-10-CM | POA: Diagnosis not present

## 2019-09-13 DIAGNOSIS — I129 Hypertensive chronic kidney disease with stage 1 through stage 4 chronic kidney disease, or unspecified chronic kidney disease: Secondary | ICD-10-CM | POA: Diagnosis not present

## 2019-10-29 DIAGNOSIS — E785 Hyperlipidemia, unspecified: Secondary | ICD-10-CM | POA: Diagnosis not present

## 2019-10-29 DIAGNOSIS — N1831 Chronic kidney disease, stage 3a: Secondary | ICD-10-CM | POA: Diagnosis not present

## 2019-10-29 DIAGNOSIS — I129 Hypertensive chronic kidney disease with stage 1 through stage 4 chronic kidney disease, or unspecified chronic kidney disease: Secondary | ICD-10-CM | POA: Diagnosis not present

## 2019-11-07 DIAGNOSIS — H10503 Unspecified blepharoconjunctivitis, bilateral: Secondary | ICD-10-CM | POA: Diagnosis not present

## 2019-11-07 DIAGNOSIS — H18593 Other hereditary corneal dystrophies, bilateral: Secondary | ICD-10-CM | POA: Diagnosis not present

## 2019-11-21 DIAGNOSIS — H10503 Unspecified blepharoconjunctivitis, bilateral: Secondary | ICD-10-CM | POA: Diagnosis not present

## 2019-11-22 DIAGNOSIS — H6123 Impacted cerumen, bilateral: Secondary | ICD-10-CM | POA: Diagnosis not present

## 2019-11-22 DIAGNOSIS — H9193 Unspecified hearing loss, bilateral: Secondary | ICD-10-CM | POA: Diagnosis not present

## 2019-11-25 DIAGNOSIS — H6122 Impacted cerumen, left ear: Secondary | ICD-10-CM | POA: Diagnosis not present

## 2019-12-13 DIAGNOSIS — R42 Dizziness and giddiness: Secondary | ICD-10-CM | POA: Diagnosis not present

## 2019-12-13 DIAGNOSIS — J301 Allergic rhinitis due to pollen: Secondary | ICD-10-CM | POA: Diagnosis not present

## 2019-12-21 DIAGNOSIS — H8110 Benign paroxysmal vertigo, unspecified ear: Secondary | ICD-10-CM | POA: Diagnosis not present

## 2019-12-21 DIAGNOSIS — I129 Hypertensive chronic kidney disease with stage 1 through stage 4 chronic kidney disease, or unspecified chronic kidney disease: Secondary | ICD-10-CM | POA: Diagnosis not present

## 2019-12-21 DIAGNOSIS — N1831 Chronic kidney disease, stage 3a: Secondary | ICD-10-CM | POA: Diagnosis not present

## 2019-12-26 DIAGNOSIS — R262 Difficulty in walking, not elsewhere classified: Secondary | ICD-10-CM | POA: Diagnosis not present

## 2019-12-26 DIAGNOSIS — R42 Dizziness and giddiness: Secondary | ICD-10-CM | POA: Diagnosis not present

## 2019-12-26 DIAGNOSIS — R2681 Unsteadiness on feet: Secondary | ICD-10-CM | POA: Diagnosis not present

## 2020-01-05 ENCOUNTER — Ambulatory Visit: Payer: Medicare Other | Admitting: Physical Therapy

## 2020-03-01 DIAGNOSIS — H8113 Benign paroxysmal vertigo, bilateral: Secondary | ICD-10-CM | POA: Diagnosis not present

## 2020-03-01 DIAGNOSIS — N1831 Chronic kidney disease, stage 3a: Secondary | ICD-10-CM | POA: Diagnosis not present

## 2020-03-01 DIAGNOSIS — I129 Hypertensive chronic kidney disease with stage 1 through stage 4 chronic kidney disease, or unspecified chronic kidney disease: Secondary | ICD-10-CM | POA: Diagnosis not present

## 2020-03-15 DIAGNOSIS — E785 Hyperlipidemia, unspecified: Secondary | ICD-10-CM | POA: Diagnosis not present

## 2020-03-15 DIAGNOSIS — N1831 Chronic kidney disease, stage 3a: Secondary | ICD-10-CM | POA: Diagnosis not present

## 2020-03-15 DIAGNOSIS — I129 Hypertensive chronic kidney disease with stage 1 through stage 4 chronic kidney disease, or unspecified chronic kidney disease: Secondary | ICD-10-CM | POA: Diagnosis not present

## 2020-03-29 DIAGNOSIS — Z23 Encounter for immunization: Secondary | ICD-10-CM | POA: Diagnosis not present

## 2020-04-19 DIAGNOSIS — N1831 Chronic kidney disease, stage 3a: Secondary | ICD-10-CM | POA: Diagnosis not present

## 2020-04-19 DIAGNOSIS — E785 Hyperlipidemia, unspecified: Secondary | ICD-10-CM | POA: Diagnosis not present

## 2020-04-19 DIAGNOSIS — I129 Hypertensive chronic kidney disease with stage 1 through stage 4 chronic kidney disease, or unspecified chronic kidney disease: Secondary | ICD-10-CM | POA: Diagnosis not present

## 2020-04-30 ENCOUNTER — Emergency Department (HOSPITAL_COMMUNITY): Payer: Medicare Other

## 2020-04-30 ENCOUNTER — Other Ambulatory Visit: Payer: Self-pay

## 2020-04-30 ENCOUNTER — Encounter (HOSPITAL_COMMUNITY): Payer: Self-pay | Admitting: Emergency Medicine

## 2020-04-30 ENCOUNTER — Inpatient Hospital Stay (HOSPITAL_COMMUNITY)
Admission: EM | Admit: 2020-04-30 | Discharge: 2020-05-04 | DRG: 522 | Disposition: A | Discharge status: Home Health | Payer: Medicare Other | Attending: Family Medicine | Admitting: Family Medicine

## 2020-04-30 DIAGNOSIS — W11XXXA Fall on and from ladder, initial encounter: Secondary | ICD-10-CM | POA: Diagnosis present

## 2020-04-30 DIAGNOSIS — Z20822 Contact with and (suspected) exposure to covid-19: Secondary | ICD-10-CM | POA: Diagnosis not present

## 2020-04-30 DIAGNOSIS — I255 Ischemic cardiomyopathy: Secondary | ICD-10-CM | POA: Diagnosis not present

## 2020-04-30 DIAGNOSIS — S72011A Unspecified intracapsular fracture of right femur, initial encounter for closed fracture: Secondary | ICD-10-CM | POA: Diagnosis not present

## 2020-04-30 DIAGNOSIS — R2241 Localized swelling, mass and lump, right lower limb: Secondary | ICD-10-CM | POA: Diagnosis not present

## 2020-04-30 DIAGNOSIS — D72829 Elevated white blood cell count, unspecified: Secondary | ICD-10-CM | POA: Diagnosis present

## 2020-04-30 DIAGNOSIS — S72009A Fracture of unspecified part of neck of unspecified femur, initial encounter for closed fracture: Secondary | ICD-10-CM | POA: Insufficient documentation

## 2020-04-30 DIAGNOSIS — M25561 Pain in right knee: Secondary | ICD-10-CM | POA: Diagnosis not present

## 2020-04-30 DIAGNOSIS — Z87891 Personal history of nicotine dependence: Secondary | ICD-10-CM | POA: Diagnosis not present

## 2020-04-30 DIAGNOSIS — Z96641 Presence of right artificial hip joint: Secondary | ICD-10-CM | POA: Diagnosis not present

## 2020-04-30 DIAGNOSIS — F411 Generalized anxiety disorder: Secondary | ICD-10-CM | POA: Diagnosis present

## 2020-04-30 DIAGNOSIS — M899 Disorder of bone, unspecified: Secondary | ICD-10-CM | POA: Diagnosis not present

## 2020-04-30 DIAGNOSIS — I10 Essential (primary) hypertension: Secondary | ICD-10-CM | POA: Diagnosis present

## 2020-04-30 DIAGNOSIS — Z79899 Other long term (current) drug therapy: Secondary | ICD-10-CM | POA: Diagnosis not present

## 2020-04-30 DIAGNOSIS — Z8249 Family history of ischemic heart disease and other diseases of the circulatory system: Secondary | ICD-10-CM

## 2020-04-30 DIAGNOSIS — S7291XA Unspecified fracture of right femur, initial encounter for closed fracture: Secondary | ICD-10-CM | POA: Diagnosis present

## 2020-04-30 DIAGNOSIS — K2970 Gastritis, unspecified, without bleeding: Secondary | ICD-10-CM | POA: Diagnosis not present

## 2020-04-30 DIAGNOSIS — K219 Gastro-esophageal reflux disease without esophagitis: Secondary | ICD-10-CM | POA: Diagnosis not present

## 2020-04-30 DIAGNOSIS — Z471 Aftercare following joint replacement surgery: Secondary | ICD-10-CM | POA: Diagnosis not present

## 2020-04-30 DIAGNOSIS — M8580 Other specified disorders of bone density and structure, unspecified site: Secondary | ICD-10-CM | POA: Diagnosis present

## 2020-04-30 DIAGNOSIS — Y92009 Unspecified place in unspecified non-institutional (private) residence as the place of occurrence of the external cause: Secondary | ICD-10-CM | POA: Diagnosis not present

## 2020-04-30 DIAGNOSIS — W19XXXA Unspecified fall, initial encounter: Secondary | ICD-10-CM | POA: Diagnosis not present

## 2020-04-30 DIAGNOSIS — M25572 Pain in left ankle and joints of left foot: Secondary | ICD-10-CM | POA: Diagnosis not present

## 2020-04-30 DIAGNOSIS — M949 Disorder of cartilage, unspecified: Secondary | ICD-10-CM

## 2020-04-30 DIAGNOSIS — R110 Nausea: Secondary | ICD-10-CM | POA: Diagnosis not present

## 2020-04-30 DIAGNOSIS — Z419 Encounter for procedure for purposes other than remedying health state, unspecified: Secondary | ICD-10-CM

## 2020-04-30 DIAGNOSIS — D649 Anemia, unspecified: Secondary | ICD-10-CM | POA: Diagnosis present

## 2020-04-30 DIAGNOSIS — S72001A Fracture of unspecified part of neck of right femur, initial encounter for closed fracture: Secondary | ICD-10-CM | POA: Diagnosis not present

## 2020-04-30 DIAGNOSIS — Z01818 Encounter for other preprocedural examination: Secondary | ICD-10-CM | POA: Diagnosis not present

## 2020-04-30 DIAGNOSIS — M25551 Pain in right hip: Secondary | ICD-10-CM | POA: Diagnosis not present

## 2020-04-30 DIAGNOSIS — S72041A Displaced fracture of base of neck of right femur, initial encounter for closed fracture: Secondary | ICD-10-CM

## 2020-04-30 DIAGNOSIS — E559 Vitamin D deficiency, unspecified: Secondary | ICD-10-CM | POA: Diagnosis not present

## 2020-04-30 DIAGNOSIS — M1611 Unilateral primary osteoarthritis, right hip: Secondary | ICD-10-CM | POA: Diagnosis not present

## 2020-04-30 DIAGNOSIS — R52 Pain, unspecified: Secondary | ICD-10-CM | POA: Diagnosis not present

## 2020-04-30 LAB — CBC WITH DIFFERENTIAL/PLATELET
Abs Immature Granulocytes: 0.09 10*3/uL — ABNORMAL HIGH (ref 0.00–0.07)
Basophils Absolute: 0.1 10*3/uL (ref 0.0–0.1)
Basophils Relative: 1 %
Eosinophils Absolute: 0 10*3/uL (ref 0.0–0.5)
Eosinophils Relative: 0 %
HCT: 37.1 % (ref 36.0–46.0)
Hemoglobin: 11.9 g/dL — ABNORMAL LOW (ref 12.0–15.0)
Immature Granulocytes: 1 %
Lymphocytes Relative: 9 %
Lymphs Abs: 1.3 10*3/uL (ref 0.7–4.0)
MCH: 28.9 pg (ref 26.0–34.0)
MCHC: 32.1 g/dL (ref 30.0–36.0)
MCV: 90 fL (ref 80.0–100.0)
Monocytes Absolute: 0.9 10*3/uL (ref 0.1–1.0)
Monocytes Relative: 6 %
Neutro Abs: 12.1 10*3/uL — ABNORMAL HIGH (ref 1.7–7.7)
Neutrophils Relative %: 83 %
Platelets: 333 10*3/uL (ref 150–400)
RBC: 4.12 MIL/uL (ref 3.87–5.11)
RDW: 12.9 % (ref 11.5–15.5)
WBC: 14.5 10*3/uL — ABNORMAL HIGH (ref 4.0–10.5)
nRBC: 0 % (ref 0.0–0.2)

## 2020-04-30 LAB — BASIC METABOLIC PANEL
Anion gap: 9 (ref 5–15)
BUN: 13 mg/dL (ref 8–23)
CO2: 26 mmol/L (ref 22–32)
Calcium: 9.1 mg/dL (ref 8.9–10.3)
Chloride: 106 mmol/L (ref 98–111)
Creatinine, Ser: 0.94 mg/dL (ref 0.44–1.00)
GFR, Estimated: 55 mL/min — ABNORMAL LOW (ref >60)
Glucose, Bld: 133 mg/dL — ABNORMAL HIGH (ref 70–99)
Potassium: 4 mmol/L (ref 3.5–5.1)
Sodium: 141 mmol/L (ref 135–145)

## 2020-04-30 LAB — RESPIRATORY PANEL BY RT PCR (FLU A&B, COVID)
Influenza A by PCR: NEGATIVE
Influenza B by PCR: NEGATIVE
SARS Coronavirus 2 by RT PCR: NEGATIVE

## 2020-04-30 LAB — PROTIME-INR
INR: 1.1 (ref 0.8–1.2)
Prothrombin Time: 13.5 seconds (ref 11.4–15.2)

## 2020-04-30 LAB — TYPE AND SCREEN
ABO/RH(D): A POS
Antibody Screen: NEGATIVE

## 2020-04-30 MED ORDER — FENTANYL CITRATE (PF) 100 MCG/2ML IJ SOLN
50.0000 ug | Freq: Once | INTRAMUSCULAR | Status: AC
  Administered 2020-04-30: 50 ug via INTRAVENOUS
  Filled 2020-04-30: qty 2

## 2020-04-30 MED ORDER — SODIUM CHLORIDE 0.9 % IV SOLN: INTRAVENOUS | Status: DC

## 2020-04-30 MED ORDER — FENTANYL CITRATE (PF) 100 MCG/2ML IJ SOLN: 50.0000 ug | INTRAMUSCULAR | Status: DC | PRN

## 2020-04-30 NOTE — ED Provider Notes (Signed)
[place] DEPT Provider Note   CSN: [id number] Arrival date & time: 04/30/20  1725     History Chief Complaint  Patient presents with  . Fall    Patricia Garcia is a 86 y.o. female.  HPI Patient presents after a fall from a stepladder.  Did hit her head but denies loss conscious.  No neck pain.  She is not on anticoagulation.  States only pain is in her right hip and pelvis and states she cannot move the hip due to the pain.  No back pain.  Pain is worse with movement.  No radiation.  The fall was earlier today.  States she was on the ground for around 2 hours because she was not able to get up.    Past Medical History:  Diagnosis Date  . Anal or rectal pain   . Arthritis   . Depression   . Diverticulosis of colon (without mention of hemorrhage)   . Hypertension   . Internal hemorrhoids without mention of complication   . Ischemic heart disease   . Osteopenia   . Slow transit constipation     Patient Active Problem List   Diagnosis Date Noted  . Chronic abdominal pain 09/23/2011  . Gas pain 09/23/2011  . GERD (gastroesophageal reflux disease) 09/23/2011  . Constipation, slow transit 09/23/2011  . Anal or rectal pain 09/23/2011  . GASTRITIS 06/26/2010  . CONSTIPATION 06/21/2010  . RECTAL BLEEDING 06/21/2010  . VITAMIN D DEFICIENCY 06/20/2010  . ANXIETY 06/20/2010  . GLAUCOMA 06/20/2010  . CATARACTS 06/20/2010  . HYPERTENSION, BENIGN 06/20/2010  . SINUSITIS, RECURRENT 06/20/2010  . GERD 06/20/2010  . OSTEOPENIA 06/20/2010  . ABDOMINAL PAIN 06/20/2010  . MICROALBUMINURIA 06/20/2010    Past Surgical History:  Procedure Laterality Date  . ABDOMINAL HYSTERECTOMY    . CATARACT EXTRACTION, BILATERAL    . MASS EXCISION  06/10/2011   Procedure: EXCISION MASS;  Surgeon: Wynonia Sours, MD;  Location: [place];  Service: Orthopedics;  Laterality: Right;  excision cyst right thumb IP, debridement IP right thumb  .  TONSILLECTOMY       OB History   No obstetric history on file.     Family History  Problem Relation Age of Onset  . Colon polyps Mother   . Cancer Father        bladder  . Heart disease Father     Social History   Tobacco Use  . Smoking status: Former Smoker    Quit date: 06/05/1984    Years since quitting: 35.9  . Smokeless tobacco: Never Used  Substance Use Topics  . Alcohol use: No  . Drug use: No    Home Medications Prior to Admission medications   Medication Sig Start Date End Date Taking? Authorizing Provider  ALPRAZolam (XANAX) 0.25 MG tablet Take 0.025 mg by mouth at bedtime as needed for anxiety.    Yes [provider]  azithromycin (ZITHROMAX Z-PAK) 250 MG tablet Take 250 mg by mouth See admin instructions. Zpack   Yes [provider]  cloNIDine (CATAPRES) 0.1 MG tablet Take 0.1 mg by mouth 2 (two) times daily.   Yes [provider]  latanoprost (XALATAN) 0.005 % ophthalmic solution Place 1 drop into both eyes at bedtime.   Yes [provider]  Naphazoline-Pheniramine (OPCON-A OP) Place 1 drop into both eyes daily as needed (for eye allergy).   Yes [provider]  sodium chloride (MURO 128) 2 % ophthalmic solution  Place 1 drop into both eyes 2 (two) times daily as needed for eye irritation.   Yes [provider]  Benzocaine-Benalkonium Cl (VAGICREAM) 5-0.13 % CREA Place 1 application vaginally 2 (two) times a week.    [provider]  cholecalciferol (VITAMIN D) 1000 UNITS tablet Take 1,000 Units by mouth daily.      [provider]  citalopram (CELEXA) 20 MG tablet Take 20 mg by mouth daily.      [provider]  estrogens, conjugated, (PREMARIN) 0.625 MG tablet Take 0.625 mg by mouth daily. Take daily for 21 days then do not take for 7 days.     [provider]  multivitamin-iron-minerals-folic acid (CENTRUM) chewable tablet Chew 1 tablet by mouth daily.      [provider]  omeprazole (PRILOSEC) 20 MG capsule Take 1 capsule (20 mg total) by mouth daily. 03/24/12   Sable Feil, MD  vitamin E 400 UNIT capsule Take 400 Units by mouth daily.      [provider]    Allergies    Penicillins, Statins, Sulfonamide derivatives, and Tramadol  Review of Systems   Review of Systems  Constitutional: Negative for appetite change.  Respiratory: Negative for shortness of breath.   Cardiovascular: Negative for chest pain.  Gastrointestinal: Negative for abdominal pain.  Genitourinary: Negative for flank pain.  Musculoskeletal:       Right hip and right pelvic pain.  Skin: Negative for rash.  Neurological: Negative for speech difficulty.  Psychiatric/Behavioral: Negative for confusion.    Physical Exam Updated Vital Signs BP (!) 164/64   Pulse 72   Temp 98 F (36.7 C) (Oral)   Resp 12   Ht 5\' 3"  (1.6 m)   Wt 56.7 kg   SpO2 100%   BMI 22.14 kg/m   Physical Exam Vitals and nursing note reviewed.  Constitutional:      Appearance: Normal appearance.  HENT:     Head: Atraumatic.  Eyes:     Extraocular Movements: Extraocular movements intact.  Cardiovascular:     Rate and Rhythm: Regular rhythm.  Abdominal:     Tenderness: There is no abdominal tenderness.  Musculoskeletal:     Cervical back: Neck supple.     Comments: Tenderness to right hip both laterally and anteriorly.  Hip held slightly flexed.  No tenderness over knee.  Sensation grossly intact in foot.  Pelvis is stable.  Skin:    General: Skin is warm.     Capillary Refill: Capillary refill takes less than 2 seconds.  Neurological:     Mental Status: She is alert and oriented to person, place, and time.     ED Results / Procedures / Treatments   Labs (all labs ordered are listed, but only abnormal results are displayed) Labs Reviewed  BASIC METABOLIC PANEL - Abnormal; Notable for the following components:      Result Value   Glucose, Bld 133 (*)    GFR,  Estimated 55 (*)    All other components within normal limits  CBC WITH DIFFERENTIAL/PLATELET - Abnormal; Notable for the following components:   WBC 14.5 (*)    Hemoglobin 11.9 (*)    Neutro Abs 12.1 (*)    Abs Immature Granulocytes 0.09 (*)    All other components within normal limits  RESPIRATORY PANEL BY RT PCR (FLU A&B, COVID)  PROTIME-INR  TYPE AND SCREEN    EKG EKG Interpretation  Date/Time:  Monday April 30 2020 20:25:59 EDT Ventricular Rate:  89 PR Interval:    QRS Duration: 151 QT Interval:  389 QTC Calculation: 474 R Axis:   -47 Text Interpretation: Sinus or ectopic atrial rhythm Left bundle branch block Confirmed by Davonna Belling [phone]) on 04/30/2020 10:00:28 PM   Radiology DG Chest Port 1 View  Result Date: 04/30/2020 CLINICAL DATA:  Hip fracture, preop evaluation EXAM: PORTABLE CHEST 1 VIEW COMPARISON:  None. FINDINGS: The heart size and mediastinal contours are within normal limits. Slight, likely chronic interstitial prominence. No pleural effusion. The visualized skeletal structures are unremarkable. IMPRESSION: No acute process in the chest. Electronically Signed   By: Macy Mis M.D.   On: 04/30/2020 20:09   DG Hip Unilat W or Wo Pelvis 2-3 Views Right  Result Date: 04/30/2020 CLINICAL DATA:  Fall from second run of stepladder and onto right hip EXAM: DG HIP (WITH OR WITHOUT PELVIS) 2-3V RIGHT COMPARISON:  CT 12/10/2016 FINDINGS: There is a subcapital fracture dislocation of the right proximal femur with slight anterior displacement and varus angulation across the fracture line. Right femoral head remains normally located in the acetabulum. Proximal left femur is intact and normally located. Bones of the pelvis appear intact and congruent. Right lateral hip swelling. Degenerative changes present in the spine, hips and pelvis. No other acute osseous or soft tissue abnormality. IMPRESSION: Mildly displaced and angulated subcapital fracture of the right  proximal femur. Electronically Signed   By: Lovena Le M.D.   On: 04/30/2020 19:51    Procedures Procedures (including critical care time)  Medications Ordered in ED Medications  0.9 %  sodium chloride infusion ( Intravenous New Bag/Given 04/30/20 2023)  fentaNYL (SUBLIMAZE) injection 50 mcg (has no administration in time range)  fentaNYL (SUBLIMAZE) injection 50 mcg (50 mcg Intravenous Given 04/30/20 1924)    ED Course  I have reviewed the triage vital signs and the nursing notes.  Pertinent labs & imaging results that were available during my care of the patient were reviewed by me and considered in my medical decision making (see chart for details).    MDM Rules/Calculators/A&P                          Patient with fall.  Has right subcapital hip fracture.  Discussed with Dr. Rolena Infante.  States that Dr. Lyla Glassing will operate tomorrow.  No other apparent injury.  Did not think the patient needs head CT.  Awake appropriate no evidence of trauma to head.  Not on anticoagulation. Final Clinical Impression(s) / ED Diagnoses Final diagnoses:  Fall, initial encounter  Closed subcapital fracture of right femur, initial encounter [facility])    Rx / DC Orders ED Discharge Orders    None       Davonna Belling, MD 04/30/20 2203

## 2020-04-30 NOTE — ED Triage Notes (Addendum)
[facility] transported pt from home to [facility] ED and reports the following:  Pt was standing on a 2-3 foot step ladder, lost balance, fell, and landed on right hip. Hit head. No blood thinners. No LOC. Tenderness on right pelvis, hip, groin. No deformity, crepitus, shorting, or rotation. EMS gave 100 mcg fentanyl and 4 mg zofran. O2 dropped to 88% after admin fentanyl, and it quickly returned to 98%. EMS applied 3L [place]. Pt was scheduled to receive 2nd moderna covid 10/07  vaccine but was taking steroids and didn't get it.

## 2020-04-30 NOTE — ED Notes (Signed)
Pt given a sandwich and water at this time.

## 2020-04-30 NOTE — H&P (Signed)
History and Physical   TIERRE GERARD NAT:[id number] DOB: 04-27-1933 DOA: 04/30/2020  Referring MD/NP/PA: Dr. Davonna Belling  PCP: Lajean Manes, MD   Outpatient Specialists: None  Patient coming from: Home  Chief Complaint: Fall with right hip pain  HPI: Patricia Garcia is a 86 y.o. female with medical history significant of hypertension, anxiety disorder, ischemic cardiomyopathy, osteopenia, diverticulosis who fell off a 2 to 3 foot stepladder at home and sustained mechanical fall with pain on her right side.  Patient fell to the the right and sustained right femoral neck fracture.  She was seen and evaluated on being admitted to the hospital for further evaluation and treatment she denied any other injury.  Did not hit her head.  She did not pass out.  Ortho will plan surgery hopefully in the morning.  Patient was previously active able to move around on her own.  Patient has received her first more than the vaccine.  ED Course: Temperature 98 blood pressure 186/98 pulse 80 Nerissa 20 oxygen sat 100% room air.  White count 14.5 hemoglobin 11.9 the rest of the CBC and chemistry appear to be within normal.  INR is 1.1.  COVID-19 screen is negative.  Chest x-ray showed no acute findings.  Patient being admitted after x-ray showed right subcapital femoral fracture.  Review of Systems: As per HPI otherwise 10 point review of systems negative.    Past Medical History:  Diagnosis Date  . Anal or rectal pain   . Arthritis   . Depression   . Diverticulosis of colon (without mention of hemorrhage)   . Hypertension   . Internal hemorrhoids without mention of complication   . Ischemic heart disease   . Osteopenia   . Slow transit constipation     Past Surgical History:  Procedure Laterality Date  . ABDOMINAL HYSTERECTOMY    . CATARACT EXTRACTION, BILATERAL    . MASS EXCISION  06/10/2011   Procedure: EXCISION MASS;  Surgeon: Wynonia Sours, MD;  Location: [place];   Service: Orthopedics;  Laterality: Right;  excision cyst right thumb IP, debridement IP right thumb  . TONSILLECTOMY       reports that she quit smoking about 35 years ago. She has never used smokeless tobacco. She reports that she does not drink alcohol and does not use drugs.  Allergies  Allergen Reactions  . Penicillins Other (See Comments)    unknown  . Prednisone Other (See Comments)    unknown  . Statins Other (See Comments)    shaking  . Sulfonamide Derivatives Other (See Comments)    unknown  . Tramadol Other (See Comments)    unknown    Family History  Problem Relation Age of Onset  . Colon polyps Mother   . Cancer Father        bladder  . Heart disease Father      Prior to Admission medications   Medication Sig Start Date End Date Taking? Authorizing Provider  ALPRAZolam (XANAX) 0.25 MG tablet Take 0.025 mg by mouth at bedtime as needed for anxiety.    Yes [provider]  azithromycin (ZITHROMAX Z-PAK) 250 MG tablet Take 250 mg by mouth See admin instructions. Zpack   Yes [provider]  Calcium Carbonate-Vit D-Min (CALCIUM 1200 PO) Take 1,200 mg by mouth daily.   Yes [provider]  cholecalciferol (VITAMIN D) 1000 UNITS tablet Take 1,000 Units by mouth daily.     Yes [provider]  citalopram (  CELEXA) 20 MG tablet Take 20 mg by mouth daily.     Yes [provider]  cloNIDine (CATAPRES) 0.1 MG tablet Take 0.1 mg by mouth 2 (two) times daily.   Yes [provider]  Elderberry 575 MG/5ML SYRP Take 10 mLs by mouth daily.   Yes [provider]  famotidine (PEPCID) 40 MG tablet Take 40 mg by mouth daily.   Yes [provider]  ferrous sulfate 325 (65 FE) MG tablet Take 325 mg by mouth See admin instructions. 3 times a week   Yes [provider]  latanoprost (XALATAN) 0.005 % ophthalmic solution Place 1 drop into both eyes at bedtime.   Yes [provider]   multivitamin-iron-minerals-folic acid (CENTRUM) chewable tablet Chew 1 tablet by mouth daily.     Yes [provider]  Naphazoline-Pheniramine (OPCON-A OP) Place 1 drop into both eyes daily as needed (for eye allergy).   Yes [provider]  OLIVE LEAF EXTRACT PO Take 1 tablet by mouth daily.   Yes [provider]  Omega-3 1000 MG CAPS Take 1,000 mg by mouth daily.   Yes [provider]  sodium chloride (MURO 128) 2 % ophthalmic solution Place 1 drop into both eyes 2 (two) times daily as needed for eye irritation.   Yes [provider]  omeprazole (PRILOSEC) 20 MG capsule Take 1 capsule (20 mg total) by mouth daily. Patient not taking: Reported on 04/30/2020 03/24/12   Sable Feil, MD    Physical Exam: Vitals:   04/30/20 2200 04/30/20 2230 04/30/20 2300 04/30/20 2330  BP: (!) 164/64 (!) 167/69 (!) 170/62 (!) 176/73  Pulse: 72 78 80 81  Resp: 12 15 14 16   Temp:      TempSrc:      SpO2: 100% 97% 96% 95%  Weight:      Height:          Constitutional: Acutely ill looking, calm and collected no distress Vitals:   04/30/20 2200 04/30/20 2230 04/30/20 2300 04/30/20 2330  BP: (!) 164/64 (!) 167/69 (!) 170/62 (!) 176/73  Pulse: 72 78 80 81  Resp: 12 15 14 16   Temp:      TempSrc:      SpO2: 100% 97% 96% 95%  Weight:      Height:       Eyes: PERRL, lids and conjunctivae normal ENMT: Mucous membranes are moist. Posterior pharynx clear of any exudate or lesions.Normal dentition.  Neck: normal, supple, no masses, no thyromegaly Respiratory: clear to auscultation bilaterally, no wheezing, no crackles. Normal respiratory effort. No accessory muscle use.  Cardiovascular: Regular rate and rhythm, no murmurs / rubs / gallops. No extremity edema. 2+ pedal pulses. No carotid bruits.  Abdomen: no tenderness, no masses palpated. No hepatosplenomegaly. Bowel sounds positive.  Musculoskeletal: no clubbing / cyanosis. No joint deformity upper and  lower extremities.  Right hip slightly rotated medially, tender to touch, no evidence of shortening  skin: no rashes, lesions, ulcers. No induration Neurologic: CN 2-12 grossly intact. Sensation intact, DTR normal. Strength 5/5 in all 4.  Psychiatric: Normal judgment and insight. Alert and oriented x 3. Normal mood.     Labs on Admission: I have personally reviewed following labs and imaging studies  CBC: Recent Labs  Lab 04/30/20 2021  WBC 14.5*  NEUTROABS 12.1*  HGB 11.9*  HCT 37.1  MCV 90.0  PLT 970   Basic Metabolic Panel: Recent Labs  Lab 04/30/20 2021  NA 141  K  4.0  CL 106  CO2 26  GLUCOSE 133*  BUN 13  CREATININE 0.94  CALCIUM 9.1   GFR: Estimated Creatinine Clearance: 36.2 mL/min (by C-G formula based on SCr of 0.94 mg/dL). Liver Function Tests: No results for input(s): AST, ALT, ALKPHOS, BILITOT, PROT, ALBUMIN in the last 168 hours. No results for input(s): LIPASE, AMYLASE in the last 168 hours. No results for input(s): AMMONIA in the last 168 hours. Coagulation Profile: Recent Labs  Lab 04/30/20 2021  INR 1.1   Cardiac Enzymes: No results for input(s): CKTOTAL, CKMB, CKMBINDEX, TROPONINI in the last 168 hours. BNP (last 3 results) No results for input(s): PROBNP in the last 8760 hours. HbA1C: No results for input(s): HGBA1C in the last 72 hours. CBG: No results for input(s): GLUCAP in the last 168 hours. Lipid Profile: No results for input(s): CHOL, HDL, LDLCALC, TRIG, CHOLHDL, LDLDIRECT in the last 72 hours. Thyroid Function Tests: No results for input(s): TSH, T4TOTAL, FREET4, T3FREE, THYROIDAB in the last 72 hours. Anemia Panel: No results for input(s): VITAMINB12, FOLATE, FERRITIN, TIBC, IRON, RETICCTPCT in the last 72 hours. Urine analysis:    Component Value Date/Time   COLORURINE LT. YELLOW 06/25/2010 1249   APPEARANCEUR CLEAR 06/25/2010 1249   LABSPEC 1.015 06/25/2010 1249   PHURINE 7.0 06/25/2010 1249   GLUCOSEU NEGATIVE 06/25/2010  1249   BILIRUBINUR NEGATIVE 06/25/2010 1249   KETONESUR NEGATIVE 06/25/2010 1249   UROBILINOGEN 0.2 06/25/2010 1249   NITRITE NEGATIVE 06/25/2010 1249   LEUKOCYTESUR NEGATIVE 06/25/2010 1249   Sepsis Labs: @LABRCNTIP (procalcitonin:4,lacticidven:4) ) Recent Results (from the past 240 hour(s))  Respiratory Panel by RT PCR (Flu A&B, Covid) - Nasopharyngeal Swab     Status: None   Collection Time: 04/30/20  8:21 PM   Specimen: Nasopharyngeal Swab  Result Value Ref Range Status   SARS Coronavirus 2 by RT PCR NEGATIVE NEGATIVE Final    Comment: (NOTE) SARS-CoV-2 target nucleic acids are NOT DETECTED.  The SARS-CoV-2 RNA is generally detectable in upper respiratoy specimens during the acute phase of infection. The lowest concentration of SARS-CoV-2 viral copies this assay can detect is 131 copies/mL. A negative result does not preclude SARS-Cov-2 infection and should not be used as the sole basis for treatment or other patient management decisions. A negative result may occur with  improper specimen collection/handling, submission of specimen other than nasopharyngeal swab, presence of viral mutation(s) within the areas targeted by this assay, and inadequate number of viral copies (<131 copies/mL). A negative result must be combined with clinical observations, patient history, and epidemiological information. The expected result is Negative.  Fact Sheet for Patients:  PinkCheek.be  Fact Sheet for Healthcare Providers:  GravelBags.it  This test is no t yet approved or cleared by the [place] FDA and  has been authorized for detection and/or diagnosis of SARS-CoV-2 by FDA under an Emergency Use Authorization (EUA). This EUA will remain  in effect (meaning this test can be used) for the duration of the COVID-19 declaration under Section 564(b)(1) of the Act, 21 U.S.C. section 360bbb-3(b)(1), unless the authorization is  terminated or revoked sooner.     Influenza A by PCR NEGATIVE NEGATIVE Final   Influenza B by PCR NEGATIVE NEGATIVE Final    Comment: (NOTE) The Xpert Xpress SARS-CoV-2/FLU/RSV assay is intended as an aid in  the diagnosis of influenza from Nasopharyngeal swab specimens and  should not be used as a sole basis for treatment. Nasal washings and  aspirates are unacceptable for Xpert Xpress SARS-CoV-2/FLU/RSV  testing.  Fact Sheet for Patients: PinkCheek.be  Fact Sheet for Healthcare Providers: GravelBags.it  This test is not yet approved or cleared by the [place] FDA and  has been authorized for detection and/or diagnosis of SARS-CoV-2 by  FDA under an Emergency Use Authorization (EUA). This EUA will remain  in effect (meaning this test can be used) for the duration of the  Covid-19 declaration under Section 564(b)(1) of the Act, 21  U.S.C. section 360bbb-3(b)(1), unless the authorization is  terminated or revoked. Performed at [facility], [place] [address], [place], [place] [address]      Radiological Exams on Admission: DG Chest Port 1 View  Result Date: 04/30/2020 CLINICAL DATA:  Hip fracture, preop evaluation EXAM: PORTABLE CHEST 1 VIEW COMPARISON:  None. FINDINGS: The heart size and mediastinal contours are within normal limits. Slight, likely chronic interstitial prominence. No pleural effusion. The visualized skeletal structures are unremarkable. IMPRESSION: No acute process in the chest. Electronically Signed   By: Macy Mis M.D.   On: 04/30/2020 20:09   DG Hip Unilat W or Wo Pelvis 2-3 Views Right  Result Date: 04/30/2020 CLINICAL DATA:  Fall from second run of stepladder and onto right hip EXAM: DG HIP (WITH OR WITHOUT PELVIS) 2-3V RIGHT COMPARISON:  CT 12/10/2016 FINDINGS: There is a subcapital fracture dislocation of the right proximal femur with slight anterior displacement and  varus angulation across the fracture line. Right femoral head remains normally located in the acetabulum. Proximal left femur is intact and normally located. Bones of the pelvis appear intact and congruent. Right lateral hip swelling. Degenerative changes present in the spine, hips and pelvis. No other acute osseous or soft tissue abnormality. IMPRESSION: Mildly displaced and angulated subcapital fracture of the right proximal femur. Electronically Signed   By: Lovena Le M.D.   On: 04/30/2020 19:51    EKG: Independently reviewed.  Normal sinus rhythm with left bundle branch block but not new.  Assessment/Plan Principal Problem:   Femur fracture, right (HCC) Active Problems:   Anxiety state   HYPERTENSION, BENIGN   GERD   Disorder of bone and cartilage     #1 right femur fracture: Patient will be admitted.  Pain control.  N.p.o. after midnight.  Appears to be secondary to mechanical fall.  Surgery is expected tomorrow.  She is hemodynamically stable and does not appear to have any reasons not to proceed with surgery at this point.  #2 fall at home: Mechanical in nature.  Patient usually mobile and active.  May be able to go back to her home.  PT OT is to evaluate after surgery and advised.  #3 essential hypertension: Continue home regimen.  #4 osteopenia: May require further screening for osteoporosis.  Calcium vitamin D and any relevant treatment thereafter.  #5 anxiety disorder: Resume home regimen.  #6 GERD: Resume home regiment.  #7 ischemic cardiomyopathy: Remote.  Nothing acute.  Patient should continue with surgery.  Already on beta-blockers.   DVT prophylaxis: SCD Code Status: Full code Family Communication: No family at bedside discussed care with patient Disposition Plan: To be determined Consults called: Orthopedic surgery Dr. Rolena Infante Admission status: Inpatient  Severity of Illness: The appropriate patient status for this patient is INPATIENT. Inpatient status is  judged to be reasonable and necessary in order to provide the required intensity of service to ensure the patient's safety. The patient's presenting symptoms, physical exam findings, and initial radiographic and laboratory data in the context of their chronic comorbidities is  felt to place them at high risk for further clinical deterioration. Furthermore, it is not anticipated that the patient will be medically stable for discharge from the hospital within 2 midnights of admission. The following factors support the patient status of inpatient.   " The patient's presenting symptoms include fall with right hip pain. " The worrisome physical exam findings include tenderness in the right hip area. " The initial radiographic and laboratory data are worrisome because of evidence of right femur fracture. " The chronic co-morbidities include hypertension.   * I certify that at the point of admission it is my clinical judgment that the patient will require inpatient hospital care spanning beyond 2 midnights from the point of admission due to high intensity of service, high risk for further deterioration and high frequency of surveillance required.Barbette Merino MD [facility] Pager [phone]  If 7PM-7AM, please contact night-coverage [email or web address] Password [facility]  04/30/2020, 11:57 PM

## 2020-04-30 NOTE — ED Notes (Signed)
Patient transported to X-ray 

## 2020-04-30 NOTE — ED Notes (Signed)
Pt returned from imaging.

## 2020-05-01 ENCOUNTER — Inpatient Hospital Stay (HOSPITAL_COMMUNITY): Payer: Medicare Other

## 2020-05-01 DIAGNOSIS — S72041A Displaced fracture of base of neck of right femur, initial encounter for closed fracture: Secondary | ICD-10-CM | POA: Diagnosis not present

## 2020-05-01 LAB — COMPREHENSIVE METABOLIC PANEL
ALT: 21 U/L (ref 0–44)
AST: 28 U/L (ref 15–41)
Albumin: 3.5 g/dL (ref 3.5–5.0)
Alkaline Phosphatase: 50 U/L (ref 38–126)
Anion gap: 8 (ref 5–15)
BUN: 13 mg/dL (ref 8–23)
CO2: 24 mmol/L (ref 22–32)
Calcium: 8.6 mg/dL — ABNORMAL LOW (ref 8.9–10.3)
Chloride: 107 mmol/L (ref 98–111)
Creatinine, Ser: 0.83 mg/dL (ref 0.44–1.00)
GFR, Estimated: >60 mL/min (ref >60)
Glucose, Bld: 130 mg/dL — ABNORMAL HIGH (ref 70–99)
Potassium: 3.9 mmol/L (ref 3.5–5.1)
Sodium: 139 mmol/L (ref 135–145)
Total Bilirubin: 0.9 mg/dL (ref 0.3–1.2)
Total Protein: 6.8 g/dL (ref 6.5–8.1)

## 2020-05-01 LAB — CBC
HCT: 34.9 % — ABNORMAL LOW (ref 36.0–46.0)
Hemoglobin: 11.3 g/dL — ABNORMAL LOW (ref 12.0–15.0)
MCH: 29.1 pg (ref 26.0–34.0)
MCHC: 32.4 g/dL (ref 30.0–36.0)
MCV: 89.9 fL (ref 80.0–100.0)
Platelets: 308 10*3/uL (ref 150–400)
RBC: 3.88 MIL/uL (ref 3.87–5.11)
RDW: 13 % (ref 11.5–15.5)
WBC: 9.6 10*3/uL (ref 4.0–10.5)
nRBC: 0 % (ref 0.0–0.2)

## 2020-05-01 LAB — SURGICAL PCR SCREEN
MRSA, PCR: NEGATIVE
Staphylococcus aureus: POSITIVE — AB

## 2020-05-01 LAB — ABO/RH: ABO/RH(D): A POS

## 2020-05-01 MED ORDER — FAMOTIDINE 20 MG PO TABS
40.0000 mg | ORAL_TABLET | Freq: Every day | ORAL | Status: DC
  Administered 2020-05-01 – 2020-05-04 (×3): 40 mg via ORAL
  Filled 2020-05-01 (×3): qty 2

## 2020-05-01 MED ORDER — LACTATED RINGERS IV SOLN: INTRAVENOUS | Status: DC

## 2020-05-01 MED ORDER — ENSURE PRE-SURGERY PO LIQD
296.0000 mL | Freq: Once | ORAL | Status: AC
  Administered 2020-05-02: 296 mL via ORAL
  Filled 2020-05-01: qty 296

## 2020-05-01 MED ORDER — TRANEXAMIC ACID-NACL 1000-0.7 MG/100ML-% IV SOLN
1000.0000 mg | INTRAVENOUS | Status: AC
  Administered 2020-05-02: 1000 mg via INTRAVENOUS
  Filled 2020-05-01: qty 100

## 2020-05-01 MED ORDER — CEFAZOLIN SODIUM-DEXTROSE 2-4 GM/100ML-% IV SOLN
2.0000 g | INTRAVENOUS | Status: AC
  Administered 2020-05-02: 2 g via INTRAVENOUS
  Filled 2020-05-01: qty 100

## 2020-05-01 MED ORDER — MORPHINE SULFATE (PF) 2 MG/ML IV SOLN
2.0000 mg | INTRAVENOUS | Status: DC | PRN
  Administered 2020-05-01 (×2): 2 mg via INTRAVENOUS
  Filled 2020-05-01 (×3): qty 1

## 2020-05-01 MED ORDER — CITALOPRAM HYDROBROMIDE 20 MG PO TABS
20.0000 mg | ORAL_TABLET | Freq: Every day | ORAL | Status: DC
  Administered 2020-05-01 – 2020-05-04 (×3): 20 mg via ORAL
  Filled 2020-05-01 (×3): qty 1

## 2020-05-01 MED ORDER — POVIDONE-IODINE 10 % EX SWAB
2.0000 "application " | CUTANEOUS | Status: AC
  Administered 2020-05-02: 2 via TOPICAL

## 2020-05-01 MED ORDER — CHLORHEXIDINE GLUCONATE 4 % EX LIQD: 60.0000 mL | CUTANEOUS | Status: DC

## 2020-05-01 MED ORDER — ONDANSETRON HCL 4 MG PO TABS: 4.0000 mg | ORAL_TABLET | Freq: Four times a day (QID) | ORAL | Status: DC | PRN

## 2020-05-01 MED ORDER — CENTRUM PO CHEW
1.0000 | CHEWABLE_TABLET | Freq: Every day | ORAL | Status: DC
  Administered 2020-05-01 – 2020-05-04 (×3): 1 via ORAL
  Filled 2020-05-01 (×5): qty 1

## 2020-05-01 MED ORDER — LATANOPROST 0.005 % OP SOLN
1.0000 [drp] | Freq: Every day | OPHTHALMIC | Status: DC
  Administered 2020-05-01 – 2020-05-03 (×3): 1 [drp] via OPHTHALMIC
  Filled 2020-05-01: qty 2.5

## 2020-05-01 MED ORDER — MUPIROCIN 2 % EX OINT
1.0000 "application " | TOPICAL_OINTMENT | Freq: Two times a day (BID) | CUTANEOUS | Status: DC
  Administered 2020-05-01 – 2020-05-04 (×8): 1 via NASAL
  Filled 2020-05-01: qty 22

## 2020-05-01 MED ORDER — ONDANSETRON HCL 4 MG/2ML IJ SOLN: 4.0000 mg | Freq: Four times a day (QID) | INTRAMUSCULAR | Status: DC | PRN

## 2020-05-01 MED ORDER — CLONIDINE HCL 0.1 MG PO TABS
0.1000 mg | ORAL_TABLET | Freq: Two times a day (BID) | ORAL | Status: DC
  Administered 2020-05-01 – 2020-05-04 (×6): 0.1 mg via ORAL
  Filled 2020-05-01 (×7): qty 1

## 2020-05-01 MED ORDER — VITAMIN D 25 MCG (1000 UNIT) PO TABS
1000.0000 [IU] | ORAL_TABLET | Freq: Every day | ORAL | Status: DC
  Administered 2020-05-01 – 2020-05-04 (×3): 1000 [IU] via ORAL
  Filled 2020-05-01 (×3): qty 1

## 2020-05-01 MED ORDER — ELDERBERRY 575 MG/5ML PO SYRP: 10.0000 mL | ORAL_SOLUTION | Freq: Every day | ORAL | Status: DC

## 2020-05-01 MED ORDER — OMEGA-3 1000 MG PO CAPS: 1000.0000 mg | ORAL_CAPSULE | Freq: Every day | ORAL | Status: DC

## 2020-05-01 MED ORDER — OMEGA-3-ACID ETHYL ESTERS 1 G PO CAPS
1.0000 g | ORAL_CAPSULE | Freq: Every day | ORAL | Status: DC
  Administered 2020-05-01 – 2020-05-04 (×3): 1 g via ORAL
  Filled 2020-05-01 (×3): qty 1

## 2020-05-01 MED ORDER — NAPHAZOLINE-PHENIRAMINE 0.025-0.3 % OP SOLN
Freq: Every day | OPHTHALMIC | Status: DC | PRN
  Filled 2020-05-01: qty 15

## 2020-05-01 MED ORDER — ACETAMINOPHEN 325 MG PO TABS
650.0000 mg | ORAL_TABLET | Freq: Four times a day (QID) | ORAL | Status: DC | PRN
  Administered 2020-05-01: 650 mg via ORAL
  Filled 2020-05-01: qty 2

## 2020-05-01 MED ORDER — ALPRAZOLAM 0.25 MG PO TABS
0.1250 mg | ORAL_TABLET | Freq: Every evening | ORAL | Status: DC | PRN
  Administered 2020-05-03: 0.125 mg via ORAL
  Filled 2020-05-01: qty 1

## 2020-05-01 MED ORDER — SODIUM CHLORIDE (HYPERTONIC) 2 % OP SOLN
1.0000 [drp] | Freq: Two times a day (BID) | OPHTHALMIC | Status: DC | PRN
  Filled 2020-05-01: qty 15

## 2020-05-01 MED ORDER — FERROUS SULFATE 325 (65 FE) MG PO TABS
325.0000 mg | ORAL_TABLET | ORAL | Status: DC
  Administered 2020-05-04: 325 mg via ORAL
  Filled 2020-05-01 (×2): qty 1

## 2020-05-01 MED ORDER — ACETAMINOPHEN 650 MG RE SUPP: 650.0000 mg | Freq: Four times a day (QID) | RECTAL | Status: DC | PRN

## 2020-05-01 NOTE — H&P (View-Only) (Signed)
ORTHOPAEDIC CONSULTATION  REQUESTING PHYSICIAN: Antonieta Pert, MD  PCP:  Lajean Manes, MD  Chief Complaint: Fall with right sided hip pain  HPI: Patricia Garcia is a 86 y.o. female who complains of right sided hip pain secondary to a fall. Medical history is significant for hypertension, anxiety disorder, ischemic cardiomyopathy, osteopenia, diverticulosis. She states she fell off a 2 to 3 foot stepladder at home and felt pain on her right side.  Right sided femoral neck fracture on x-ray.  She was seen and evaluated on being admitted to the hospital for further evaluation and treatment she denied any other injury.  Did not hit her head.  She did not pass out.  Patient was previously active able to move around on her own.    Past Medical History:  Diagnosis Date  . Anal or rectal pain   . Arthritis   . Depression   . Diverticulosis of colon (without mention of hemorrhage)   . Hypertension   . Internal hemorrhoids without mention of complication   . Ischemic heart disease   . Osteopenia   . Slow transit constipation    Past Surgical History:  Procedure Laterality Date  . ABDOMINAL HYSTERECTOMY    . CATARACT EXTRACTION, BILATERAL    . MASS EXCISION  06/10/2011   Procedure: EXCISION MASS;  Surgeon: Wynonia Sours, MD;  Location: [place];  Service: Orthopedics;  Laterality: Right;  excision cyst right thumb IP, debridement IP right thumb  . TONSILLECTOMY     Social History   Socioeconomic History  . Marital status: Widowed    Spouse name: Not on file  . Number of children: 2  . Years of education: Not on file  . Highest education level: Not on file  Occupational History  . Occupation: Retired    Fish farm manager: RETIRED  Tobacco Use  . Smoking status: Former Smoker    Quit date: 06/05/1984    Years since quitting: 35.9  . Smokeless tobacco: Never Used  Substance and Sexual Activity  . Alcohol use: No  . Drug use: No  . Sexual activity: Not on file  Other  Topics Concern  . Not on file  Social History Narrative  . Not on file   Social Determinants of Health   Financial Resource Strain:   . Difficulty of Paying Living Expenses: Not on file  Food Insecurity:   . Worried About Charity fundraiser in the Last Year: Not on file  . Ran Out of Food in the Last Year: Not on file  Transportation Needs:   . Lack of Transportation (Medical): Not on file  . Lack of Transportation (Non-Medical): Not on file  Physical Activity:   . Days of Exercise per Week: Not on file  . Minutes of Exercise per Session: Not on file  Stress:   . Feeling of Stress : Not on file  Social Connections:   . Frequency of Communication with Friends and Family: Not on file  . Frequency of Social Gatherings with Friends and Family: Not on file  . Attends Religious Services: Not on file  . Active Member of Clubs or Organizations: Not on file  . Attends Archivist Meetings: Not on file  . Marital Status: Not on file   Family History  Problem Relation Age of Onset  . Colon polyps Mother   . Cancer Father        bladder  . Heart disease Father    Allergies  Allergen Reactions  . Penicillins Other (See Comments)    unknown  . Prednisone Other (See Comments)    unknown  . Statins Other (See Comments)    shaking  . Sulfonamide Derivatives Other (See Comments)    unknown  . Tramadol Other (See Comments)    unknown   Prior to Admission medications   Medication Sig Start Date End Date Taking? Authorizing Provider  ALPRAZolam (XANAX) 0.25 MG tablet Take 0.025 mg by mouth at bedtime as needed for anxiety.    Yes [provider]  azithromycin (ZITHROMAX Z-PAK) 250 MG tablet Take 250 mg by mouth See admin instructions. Zpack   Yes [provider]  Calcium Carbonate-Vit D-Min (CALCIUM 1200 PO) Take 1,200 mg by mouth daily.   Yes [provider]  cholecalciferol (VITAMIN D) 1000 UNITS tablet Take 1,000 Units by mouth daily.     Yes  [provider]  citalopram (CELEXA) 20 MG tablet Take 20 mg by mouth daily.     Yes [provider]  cloNIDine (CATAPRES) 0.1 MG tablet Take 0.1 mg by mouth 2 (two) times daily.   Yes [provider]  Elderberry 575 MG/5ML SYRP Take 10 mLs by mouth daily.   Yes [provider]  famotidine (PEPCID) 40 MG tablet Take 40 mg by mouth daily.   Yes [provider]  ferrous sulfate 325 (65 FE) MG tablet Take 325 mg by mouth See admin instructions. 3 times a week   Yes [provider]  latanoprost (XALATAN) 0.005 % ophthalmic solution Place 1 drop into both eyes at bedtime.   Yes [provider]  multivitamin-iron-minerals-folic acid (CENTRUM) chewable tablet Chew 1 tablet by mouth daily.     Yes [provider]  Naphazoline-Pheniramine (OPCON-A OP) Place 1 drop into both eyes daily as needed (for eye allergy).   Yes [provider]  OLIVE LEAF EXTRACT PO Take 1 tablet by mouth daily.   Yes [provider]  Omega-3 1000 MG CAPS Take 1,000 mg by mouth daily.   Yes [provider]  sodium chloride (MURO 128) 2 % ophthalmic solution Place 1 drop into both eyes 2 (two) times daily as needed for eye irritation.   Yes [provider]  omeprazole (PRILOSEC) 20 MG capsule Take 1 capsule (20 mg total) by mouth daily. Patient not taking: Reported on 04/30/2020 03/24/12   Sable Feil, MD   DG Chest Port 1 View  Result Date: 04/30/2020 CLINICAL DATA:  Hip fracture, preop evaluation EXAM: PORTABLE CHEST 1 VIEW COMPARISON:  None. FINDINGS: The heart size and mediastinal contours are within normal limits. Slight, likely chronic interstitial prominence. No pleural effusion. The visualized skeletal structures are unremarkable. IMPRESSION: No acute process in the chest. Electronically Signed   By: Macy Mis M.D.   On: 04/30/2020 20:09   DG Hip Unilat W or Wo Pelvis 2-3 Views Right  Result Date:  04/30/2020 CLINICAL DATA:  Fall from second run of stepladder and onto right hip EXAM: DG HIP (WITH OR WITHOUT PELVIS) 2-3V RIGHT COMPARISON:  CT 12/10/2016 FINDINGS: There is a subcapital fracture dislocation of the right proximal femur with slight anterior displacement and varus angulation across the fracture line. Right femoral head remains normally located in the acetabulum. Proximal left femur is intact and normally located. Bones of the pelvis appear intact and congruent. Right lateral hip swelling. Degenerative changes present in the spine, hips and pelvis. No other acute osseous or soft tissue abnormality. IMPRESSION: Mildly  displaced and angulated subcapital fracture of the right proximal femur. Electronically Signed   By: Lovena Le M.D.   On: 04/30/2020 19:51    Positive ROS: All other systems have been reviewed and were otherwise negative with the exception of those mentioned in the HPI and as above.  Physical Exam: General: Alert, no acute distress Cardiovascular: No pedal edema Respiratory: No cyanosis, no use of accessory musculature GI: No organomegaly, abdomen is soft and non-tender Skin: No lesions in the area of chief complaint Neurologic: Sensation intact distally Psychiatric: Patient is competent for consent with normal mood and affect Lymphatic: No axillary or cervical lymphadenopathy  MUSCULOSKELETAL: no clubbing / cyanosis. No joint deformity upper and lower extremities. 2+ pulses bilaterally.  Right hip slightly rotated medially, tender to touch, no evidence of shortening   Assessment: Mildly displaced and angulated subcapital fracture of the right proximal femur  Plan: Right femur fracture: Surgery scheduled for Wednesday with Dr. Lyla Glassing. NPO after midnight.     Dorothyann Peng, PA [phone]    05/01/2020 8:45 AM

## 2020-05-01 NOTE — Progress Notes (Signed)
Pt signed consent form. Pt denied any further questions or concerns. Consent placed in chart.

## 2020-05-01 NOTE — Consult Note (Signed)
ORTHOPAEDIC CONSULTATION  REQUESTING PHYSICIAN: Antonieta Pert, MD  PCP:  Lajean Manes, MD  Chief Complaint: Fall with right sided hip pain  HPI: Patricia Garcia is a 86 y.o. female who complains of right sided hip pain secondary to a fall. Medical history is significant for hypertension, anxiety disorder, ischemic cardiomyopathy, osteopenia, diverticulosis. She states she fell off a 2 to 3 foot stepladder at home and felt pain on her right side.  Right sided femoral neck fracture on x-ray.  She was seen and evaluated on being admitted to the hospital for further evaluation and treatment she denied any other injury.  Did not hit her head.  She did not pass out.  Patient was previously active able to move around on her own.    Past Medical History:  Diagnosis Date  . Anal or rectal pain   . Arthritis   . Depression   . Diverticulosis of colon (without mention of hemorrhage)   . Hypertension   . Internal hemorrhoids without mention of complication   . Ischemic heart disease   . Osteopenia   . Slow transit constipation    Past Surgical History:  Procedure Laterality Date  . ABDOMINAL HYSTERECTOMY    . CATARACT EXTRACTION, BILATERAL    . MASS EXCISION  06/10/2011   Procedure: EXCISION MASS;  Surgeon: Wynonia Sours, MD;  Location: [facility];  Service: Orthopedics;  Laterality: Right;  excision cyst right thumb IP, debridement IP right thumb  . TONSILLECTOMY     Social History   Socioeconomic History  . Marital status: Widowed    Spouse name: Not on file  . Number of children: 2  . Years of education: Not on file  . Highest education level: Not on file  Occupational History  . Occupation: Retired    Fish farm manager: RETIRED  Tobacco Use  . Smoking status: Former Smoker    Quit date: 06/05/1984    Years since quitting: 35.9  . Smokeless tobacco: Never Used  Substance and Sexual Activity  . Alcohol use: No  . Drug use: No  . Sexual activity: Not on file  Other  Topics Concern  . Not on file  Social History Narrative  . Not on file   Social Determinants of Health   Financial Resource Strain:   . Difficulty of Paying Living Expenses: Not on file  Food Insecurity:   . Worried About Charity fundraiser in the Last Year: Not on file  . Ran Out of Food in the Last Year: Not on file  Transportation Needs:   . Lack of Transportation (Medical): Not on file  . Lack of Transportation (Non-Medical): Not on file  Physical Activity:   . Days of Exercise per Week: Not on file  . Minutes of Exercise per Session: Not on file  Stress:   . Feeling of Stress : Not on file  Social Connections:   . Frequency of Communication with Friends and Family: Not on file  . Frequency of Social Gatherings with Friends and Family: Not on file  . Attends Religious Services: Not on file  . Active Member of Clubs or Organizations: Not on file  . Attends Archivist Meetings: Not on file  . Marital Status: Not on file   Family History  Problem Relation Age of Onset  . Colon polyps Mother   . Cancer Father        bladder  . Heart disease Father    Allergies  Allergen Reactions  . Penicillins Other (See Comments)    unknown  . Prednisone Other (See Comments)    unknown  . Statins Other (See Comments)    shaking  . Sulfonamide Derivatives Other (See Comments)    unknown  . Tramadol Other (See Comments)    unknown   Prior to Admission medications   Medication Sig Start Date End Date Taking? Authorizing Provider  ALPRAZolam (XANAX) 0.25 MG tablet Take 0.025 mg by mouth at bedtime as needed for anxiety.    Yes [provider]  azithromycin (ZITHROMAX Z-PAK) 250 MG tablet Take 250 mg by mouth See admin instructions. Zpack   Yes [provider]  Calcium Carbonate-Vit D-Min (CALCIUM 1200 PO) Take 1,200 mg by mouth daily.   Yes [provider]  cholecalciferol (VITAMIN D) 1000 UNITS tablet Take 1,000 Units by mouth daily.     Yes  [provider]  citalopram (CELEXA) 20 MG tablet Take 20 mg by mouth daily.     Yes [provider]  cloNIDine (CATAPRES) 0.1 MG tablet Take 0.1 mg by mouth 2 (two) times daily.   Yes [provider]  Elderberry 575 MG/5ML SYRP Take 10 mLs by mouth daily.   Yes [provider]  famotidine (PEPCID) 40 MG tablet Take 40 mg by mouth daily.   Yes [provider]  ferrous sulfate 325 (65 FE) MG tablet Take 325 mg by mouth See admin instructions. 3 times a week   Yes [provider]  latanoprost (XALATAN) 0.005 % ophthalmic solution Place 1 drop into both eyes at bedtime.   Yes [provider]  multivitamin-iron-minerals-folic acid (CENTRUM) chewable tablet Chew 1 tablet by mouth daily.     Yes [provider]  Naphazoline-Pheniramine (OPCON-A OP) Place 1 drop into both eyes daily as needed (for eye allergy).   Yes [provider]  OLIVE LEAF EXTRACT PO Take 1 tablet by mouth daily.   Yes [provider]  Omega-3 1000 MG CAPS Take 1,000 mg by mouth daily.   Yes [provider]  sodium chloride (MURO 128) 2 % ophthalmic solution Place 1 drop into both eyes 2 (two) times daily as needed for eye irritation.   Yes [provider]  omeprazole (PRILOSEC) 20 MG capsule Take 1 capsule (20 mg total) by mouth daily. Patient not taking: Reported on 04/30/2020 03/24/12   Sable Feil, MD   DG Chest Port 1 View  Result Date: 04/30/2020 CLINICAL DATA:  Hip fracture, preop evaluation EXAM: PORTABLE CHEST 1 VIEW COMPARISON:  None. FINDINGS: The heart size and mediastinal contours are within normal limits. Slight, likely chronic interstitial prominence. No pleural effusion. The visualized skeletal structures are unremarkable. IMPRESSION: No acute process in the chest. Electronically Signed   By: Macy Mis M.D.   On: 04/30/2020 20:09   DG Hip Unilat W or Wo Pelvis 2-3 Views Right  Result Date:  04/30/2020 CLINICAL DATA:  Fall from second run of stepladder and onto right hip EXAM: DG HIP (WITH OR WITHOUT PELVIS) 2-3V RIGHT COMPARISON:  CT 12/10/2016 FINDINGS: There is a subcapital fracture dislocation of the right proximal femur with slight anterior displacement and varus angulation across the fracture line. Right femoral head remains normally located in the acetabulum. Proximal left femur is intact and normally located. Bones of the pelvis appear intact and congruent. Right lateral hip swelling. Degenerative changes present in the spine, hips and pelvis. No other acute osseous or soft tissue abnormality. IMPRESSION: Mildly  displaced and angulated subcapital fracture of the right proximal femur. Electronically Signed   By: Lovena Le M.D.   On: 04/30/2020 19:51    Positive ROS: All other systems have been reviewed and were otherwise negative with the exception of those mentioned in the HPI and as above.  Physical Exam: General: Alert, no acute distress Cardiovascular: No pedal edema Respiratory: No cyanosis, no use of accessory musculature GI: No organomegaly, abdomen is soft and non-tender Skin: No lesions in the area of chief complaint Neurologic: Sensation intact distally Psychiatric: Patient is competent for consent with normal mood and affect Lymphatic: No axillary or cervical lymphadenopathy  MUSCULOSKELETAL: no clubbing / cyanosis. No joint deformity upper and lower extremities. 2+ pulses bilaterally.  Right hip slightly rotated medially, tender to touch, no evidence of shortening   Assessment: Mildly displaced and angulated subcapital fracture of the right proximal femur  Plan: Right femur fracture: Surgery scheduled for Wednesday with Dr. Lyla Glassing. NPO after midnight.     Dorothyann Peng, PA [phone]    05/01/2020 8:45 AM

## 2020-05-01 NOTE — Progress Notes (Signed)
PROGRESS NOTE    ALIZAE BECHTEL  LKG:[id number] DOB: 08-20-1932 DOA: 04/30/2020 PCP: Lajean Manes, MD   Chief Complaint  Patient presents with  . Fall    Brief Narrative: As per admitting MD: 86 y.o. female with medical history significant of hypertension, anxiety disorder, ischemic cardiomyopathy, osteopenia, diverticulosis who fell off a 2 to 3 foot stepladder at home and sustained mechanical fall with pain on her right side.  Patient fell to the the right and sustained right femoral neck fracture.  She was seen and evaluated on being admitted to the hospital for further evaluation and treatment she denied any other injury.  Did not hit her head.  She did not pass out.  Ortho will plan surgery hopefully in the morning.  Patient was previously active able to move around on her own.  Patient has received her first more than the vaccine.  ED Course: Temperature 98 blood pressure 186/98 pulse 80 Nerissa 20 oxygen sat 100% room air.  White count 14.5 hemoglobin 11.9 the rest of the CBC and chemistry appear to be within normal.  INR is 1.1. COVID-19 screen is negative.  Chest x-ray showed no acute findings.  Patient being admitted after x-ray showed right subcapital femoral fracture.  Subjective: Alert awake some pain on the right hip.  Son at the bedside.  Mild wrist pain.   Assessment & Plan:  Femur fracture, right 2/2 fall at home: Appreciate orthopedic input defer further operative intervention to orthopedic noted plan for surgical repair tomorrow morning.  Continue PT OT DVT prophylaxis as per orthopedics.  Osteopenia: Calcium vitamin D post op  Ischemic Cardiomyopathy hx: remote hx. denies any cardiac history or any MI or stent placement.  See #catheter despite mild perfusion Lexiscan on 08/29/2019 with no ischemia or infarct EF estimated at 46%-mildly decreased.  Leucocytosis- likely from #1, resolved  Anxiety state:Continue home Xanax, Celexa  HTN: BP controlled continue on  clonidine twice daily  GERD: Continue Pepcid  Nutrition: Diet Order            Diet regular Room service appropriate? Yes; Fluid consistency: Thin  Diet effective now                 Body mass index is 22.37 kg/m.  DVT prophylaxis: SCDs Start: 05/01/20 0019 Code Status:   Code Status: Full Code  Family Communication: plan of care discussed with patient at bedside.  Status is: Inpatient Remains inpatient appropriate because:Inpatient level of care appropriate due to severity of illness and For hip fracture surgery  Dispo: The patient is from: Home              Anticipated d/c is to: SNF              Anticipated d/c date is: 3 days              Patient currently is not medically stable to d/c.  Consultants:see note  Procedures:see note  Culture/Microbiology No results found for: SDES, SPECREQUEST, CULT, REPTSTATUS  Other culture-see note  Medications: Scheduled Meds: . cholecalciferol  1,000 Units Oral Daily  . citalopram  20 mg Oral Daily  . cloNIDine  0.1 mg Oral BID  . famotidine  40 mg Oral Daily  . [START ON 05/02/2020] ferrous sulfate  325 mg Oral Q M,W,F  . latanoprost  1 drop Both Eyes QHS  . multivitamin-iron-minerals-folic acid  1 tablet Oral Daily  . mupirocin ointment  1 application Nasal BID  . omega-3 acid  ethyl esters  1 g Oral Daily   Continuous Infusions: . sodium chloride Stopped (05/01/20 0022)  . lactated ringers 75 mL/hr at 05/01/20 0600    Antimicrobials: Anti-infectives (From admission, onward)   None     Objective: Vitals: Today's Vitals   05/01/20 0136 05/01/20 0335 05/01/20 0512 05/01/20 0800  BP:   (!) 145/65   Pulse:   80   Resp:   14   Temp:   98.4 F (36.9 C)   TempSrc:   Oral   SpO2:   97%   Weight:      Height:      PainSc: Asleep 8   0-No pain    Intake/Output Summary (Last 24 hours) at 05/01/2020 0841 Last data filed at 05/01/2020 0800 Gross per 24 hour  Intake 862.92 ml  Output 400 ml  Net 462.92 ml    Filed Weights   04/30/20 1744 05/01/20 0041  Weight: 56.7 kg 57.3 kg   Weight change:   Intake/Output from previous day: 10/11 0701 - 10/12 0700 In: 862.9 [I.V.:862.9] Out: 400 [Urine:400] Intake/Output this shift: No intake/output data recorded.  Examination: General exam: AAO,NAD, weak appearing. HEENT:Oral mucosa moist, Ear/Nose WNL grossly,dentition normal. Respiratory system: bilaterally clear,no wheezing or crackles,no use of accessory muscle, non tender. Cardiovascular system: S1 & S2 +, regular, No JVD. Gastrointestinal system: Abdomen soft, NT,ND, BS+. Nervous System:Alert, awake, moving extremities and grossly nonfocal Extremities: No edema, distal peripheral pulses palpable.  Right hip tender on exam, has ice pack in place. Skin: No rashes,no icterus. MSK: Normal muscle bulk,tone, power  Data Reviewed: I have personally reviewed following labs and imaging studies CBC: Recent Labs  Lab 04/30/20 2021 05/01/20 0328  WBC 14.5* 9.6  NEUTROABS 12.1*  --   HGB 11.9* 11.3*  HCT 37.1 34.9*  MCV 90.0 89.9  PLT 333 572   Basic Metabolic Panel: Recent Labs  Lab 04/30/20 2021 05/01/20 0328  NA 141 139  K 4.0 3.9  CL 106 107  CO2 26 24  GLUCOSE 133* 130*  BUN 13 13  CREATININE 0.94 0.83  CALCIUM 9.1 8.6*   GFR: Estimated Creatinine Clearance: 41 mL/min (by C-G formula based on SCr of 0.83 mg/dL). Liver Function Tests: Recent Labs  Lab 05/01/20 0328  AST 28  ALT 21  ALKPHOS 50  BILITOT 0.9  PROT 6.8  ALBUMIN 3.5   No results for input(s): LIPASE, AMYLASE in the last 168 hours. No results for input(s): AMMONIA in the last 168 hours. Coagulation Profile: Recent Labs  Lab 04/30/20 2021  INR 1.1   Cardiac Enzymes: No results for input(s): CKTOTAL, CKMB, CKMBINDEX, TROPONINI in the last 168 hours. BNP (last 3 results) No results for input(s): PROBNP in the last 8760 hours. HbA1C: No results for input(s): HGBA1C in the last 72 hours. CBG: No  results for input(s): GLUCAP in the last 168 hours. Lipid Profile: No results for input(s): CHOL, HDL, LDLCALC, TRIG, CHOLHDL, LDLDIRECT in the last 72 hours. Thyroid Function Tests: No results for input(s): TSH, T4TOTAL, FREET4, T3FREE, THYROIDAB in the last 72 hours. Anemia Panel: No results for input(s): VITAMINB12, FOLATE, FERRITIN, TIBC, IRON, RETICCTPCT in the last 72 hours. Sepsis Labs: No results for input(s): PROCALCITON, LATICACIDVEN in the last 168 hours.  Recent Results (from the past 240 hour(s))  Respiratory Panel by RT PCR (Flu A&B, Covid) - Nasopharyngeal Swab     Status: None   Collection Time: 04/30/20  8:21 PM   Specimen: Nasopharyngeal Swab  Result Value  Ref Range Status   SARS Coronavirus 2 by RT PCR NEGATIVE NEGATIVE Final    Comment: (NOTE) SARS-CoV-2 target nucleic acids are NOT DETECTED.  The SARS-CoV-2 RNA is generally detectable in upper respiratoy specimens during the acute phase of infection. The lowest concentration of SARS-CoV-2 viral copies this assay can detect is 131 copies/mL. A negative result does not preclude SARS-Cov-2 infection and should not be used as the sole basis for treatment or other patient management decisions. A negative result may occur with  improper specimen collection/handling, submission of specimen other than nasopharyngeal swab, presence of viral mutation(s) within the areas targeted by this assay, and inadequate number of viral copies (<131 copies/mL). A negative result must be combined with clinical observations, patient history, and epidemiological information. The expected result is Negative.  Fact Sheet for Patients:  PinkCheek.be  Fact Sheet for Healthcare Providers:  GravelBags.it  This test is no t yet approved or cleared by the [place] FDA and  has been authorized for detection and/or diagnosis of SARS-CoV-2 by FDA under an Emergency Use  Authorization (EUA). This EUA will remain  in effect (meaning this test can be used) for the duration of the COVID-19 declaration under Section 564(b)(1) of the Act, 21 U.S.C. section 360bbb-3(b)(1), unless the authorization is terminated or revoked sooner.     Influenza A by PCR NEGATIVE NEGATIVE Final   Influenza B by PCR NEGATIVE NEGATIVE Final    Comment: (NOTE) The Xpert Xpress SARS-CoV-2/FLU/RSV assay is intended as an aid in  the diagnosis of influenza from Nasopharyngeal swab specimens and  should not be used as a sole basis for treatment. Nasal washings and  aspirates are unacceptable for Xpert Xpress SARS-CoV-2/FLU/RSV  testing.  Fact Sheet for Patients: PinkCheek.be  Fact Sheet for Healthcare Providers: GravelBags.it  This test is not yet approved or cleared by the [place] FDA and  has been authorized for detection and/or diagnosis of SARS-CoV-2 by  FDA under an Emergency Use Authorization (EUA). This EUA will remain  in effect (meaning this test can be used) for the duration of the  Covid-19 declaration under Section 564(b)(1) of the Act, 21  U.S.C. section 360bbb-3(b)(1), unless the authorization is  terminated or revoked. Performed at [facility], [place] [address], [place], [place] [address]   Surgical PCR screen     Status: Abnormal   Collection Time: 05/01/20  1:15 AM   Specimen: Nasal Mucosa; Nasal Swab  Result Value Ref Range Status   MRSA, PCR NEGATIVE NEGATIVE Final   Staphylococcus aureus POSITIVE (A) NEGATIVE Final    Comment: (NOTE) The Xpert SA Assay (FDA approved for NASAL specimens in patients 45 years of age and older), is one component of a comprehensive surveillance program. It is not intended to diagnose infection nor to guide or monitor treatment. Performed at [facility], [place] [address], [place], [place] [address]      Radiology  Studies: [facility] Chest Port 1 View  Result Date: 04/30/2020 CLINICAL DATA:  Hip fracture, preop evaluation EXAM: PORTABLE CHEST 1 VIEW COMPARISON:  None. FINDINGS: The heart size and mediastinal contours are within normal limits. Slight, likely chronic interstitial prominence. No pleural effusion. The visualized skeletal structures are unremarkable. IMPRESSION: No acute process in the chest. Electronically Signed   By: Macy Mis M.D.   On: 04/30/2020 20:09   DG Knee Right Port  Result Date: 05/01/2020 CLINICAL DATA:  Pain following recent fall EXAM: PORTABLE RIGHT KNEE - 1-2 VIEW COMPARISON:  None. FINDINGS: Frontal and lateral views were obtained. No fracture or dislocation. No joint effusion. Joint spaces appear normal. No erosive change. IMPRESSION: No fracture, dislocation, or joint effusion. No evident arthropathy. Electronically Signed   By: Lowella Grip III M.D.   On: 05/01/2020 08:26   DG Hip Unilat W or Wo Pelvis 2-3 Views Right  Result Date: 04/30/2020 CLINICAL DATA:  Fall from second run of stepladder and onto right hip EXAM: DG HIP (WITH OR WITHOUT PELVIS) 2-3V RIGHT COMPARISON:  CT 12/10/2016 FINDINGS: There is a subcapital fracture dislocation of the right proximal femur with slight anterior displacement and varus angulation across the fracture line. Right femoral head remains normally located in the acetabulum. Proximal left femur is intact and normally located. Bones of the pelvis appear intact and congruent. Right lateral hip swelling. Degenerative changes present in the spine, hips and pelvis. No other acute osseous or soft tissue abnormality. IMPRESSION: Mildly displaced and angulated subcapital fracture of the right proximal femur. Electronically Signed   By: Lovena Le M.D.   On: 04/30/2020 19:51     LOS: 1 day   Antonieta Pert, MD [facility]  05/01/2020, 8:41 AM

## 2020-05-01 NOTE — Plan of Care (Signed)
Plan of care discussed.   

## 2020-05-01 NOTE — Progress Notes (Signed)
PHARMACIST - PHYSICIAN ORDER COMMUNICATION  CONCERNING: P&T Medication Policy on Herbal Medications  DESCRIPTION:  This patient's order for:  Elderberry syrup  has been noted.  This product(s) is classified as an "herbal" or natural product. Due to a lack of definitive safety studies or FDA approval, nonstandard manufacturing practices, plus the potential risk of unknown drug-drug interactions while on inpatient medications, the Pharmacy and Therapeutics Committee does not permit the use of "herbal" or natural products of this type within [facility].   ACTION TAKEN: The pharmacy department is unable to verify this order at this time and your patient has been informed of this safety policy. Please reevaluate patient's clinical condition at discharge and address if the herbal or natural product(s) should be resumed at that time.  Leone Haven, PharmD

## 2020-05-01 NOTE — ED Notes (Signed)
ED TO INPATIENT HANDOFF REPORT  Name/Age/Gender Patricia Garcia 86 y.o. female  Code Status Advance Directive Documentation     Most Recent Value  Type of Advance Directive Living will, Healthcare Power of Attorney  Pre-existing out of facility DNR order (yellow form or pink MOST form) --  "MOST" Form in Place? --      Home/SNF/Other Home  Chief Complaint Femur fracture, right ([place]) [S72.91XA]  Level of Care/Admitting Diagnosis ED Disposition    ED Disposition Condition [place]: [place] [100102]  Level of Care: Med-Surg [16]  May admit patient to [facility] or [facility] if equivalent level of care is available:: No  Covid Evaluation: Confirmed COVID Negative  Diagnosis: Femur fracture, right [facility]) [[id number]]  Admitting Physician: Elwyn Reach [[id number]]  Attending Physician: Elwyn Reach [[id number]]  Estimated length of stay: past midnight tomorrow  Certification:: I certify this patient will need inpatient services for at least 2 midnights       Medical History Past Medical History:  Diagnosis Date  . Anal or rectal pain   . Arthritis   . Depression   . Diverticulosis of colon (without mention of hemorrhage)   . Hypertension   . Internal hemorrhoids without mention of complication   . Ischemic heart disease   . Osteopenia   . Slow transit constipation     Allergies Allergies  Allergen Reactions  . Penicillins Other (See Comments)    unknown  . Prednisone Other (See Comments)    unknown  . Statins Other (See Comments)    shaking  . Sulfonamide Derivatives Other (See Comments)    unknown  . Tramadol Other (See Comments)    unknown    IV Location/Drains/Wounds Patient Lines/Drains/Airways Status    Active Line/Drains/Airways    Name Placement date Placement time Site Days   Peripheral IV 04/30/20 Left Antecubital 04/30/20  1736  Antecubital  1   External Urinary Catheter 04/30/20  1900  --  1    Incision 06/10/11 Hand Right 06/10/11  1007   3248          Labs/Imaging Results for orders placed or performed during the hospital encounter of 04/30/20 (from the past 48 hour(s))  Basic metabolic panel     Status: Abnormal   Collection Time: 04/30/20  8:21 PM  Result Value Ref Range   Sodium 141 135 - 145 mmol/L   Potassium 4.0 3.5 - 5.1 mmol/L   Chloride 106 98 - 111 mmol/L   CO2 26 22 - 32 mmol/L   Glucose, Bld 133 (H) 70 - 99 mg/dL    Comment: Glucose reference range applies only to samples taken after fasting for at least 8 hours.   BUN 13 8 - 23 mg/dL   Creatinine, Ser 0.94 0.44 - 1.00 mg/dL   Calcium 9.1 8.9 - 10.3 mg/dL   GFR, Estimated 55 (L) >60 mL/min   Anion gap 9 5 - 15    Comment: Performed at [facility], [place] [address], [place], [place] [address]  CBC WITH DIFFERENTIAL     Status: Abnormal   Collection Time: 04/30/20  8:21 PM  Result Value Ref Range   WBC 14.5 (H) 4.0 - 10.5 K/uL   RBC 4.12 3.87 - 5.11 MIL/uL   Hemoglobin 11.9 (L) 12.0 - 15.0 g/dL   HCT 37.1 36 - 46 %   MCV 90.0 80.0 - 100.0 fL   MCH 28.9 26.0 -  34.0 pg   MCHC 32.1 30.0 - 36.0 g/dL   RDW 12.9 11.5 - 15.5 %   Platelets 333 150 - 400 K/uL   nRBC 0.0 0.0 - 0.2 %   Neutrophils Relative % 83 %   Neutro Abs 12.1 (H) 1.7 - 7.7 K/uL   Lymphocytes Relative 9 %   Lymphs Abs 1.3 0.7 - 4.0 K/uL   Monocytes Relative 6 %   Monocytes Absolute 0.9 0.1 - 1.0 K/uL   Eosinophils Relative 0 %   Eosinophils Absolute 0.0 0 - 0 K/uL   Basophils Relative 1 %   Basophils Absolute 0.1 0 - 0 K/uL   Immature Granulocytes 1 %   Abs Immature Granulocytes 0.09 (H) 0.00 - 0.07 K/uL    Comment: Performed at [facility], [place] [address] [place], [place] [address]  Protime-INR     Status: None   Collection Time: 04/30/20  8:21 PM  Result Value Ref Range   Prothrombin Time 13.5 11.4 - 15.2 seconds   INR 1.1 0.8 - 1.2    Comment: (NOTE) INR goal varies based on device and  disease states. Performed at [facility], [place] [address] [place], [place] [address]   Type and screen [place]     Status: None   Collection Time: 04/30/20  8:21 PM  Result Value Ref Range   ABO/RH(D) A POS    Antibody Screen NEG    Sample Expiration      05/03/2020,2359 Performed at [facility], [place] [address], [place], [place] [address]   Respiratory Panel by RT PCR (Flu A&B, Covid) - Nasopharyngeal Swab     Status: None   Collection Time: 04/30/20  8:21 PM   Specimen: Nasopharyngeal Swab  Result Value Ref Range   SARS Coronavirus 2 by RT PCR NEGATIVE NEGATIVE    Comment: (NOTE) SARS-CoV-2 target nucleic acids are NOT DETECTED.  The SARS-CoV-2 RNA is generally detectable in upper respiratoy specimens during the acute phase of infection. The lowest concentration of SARS-CoV-2 viral copies this assay can detect is 131 copies/mL. A negative result does not preclude SARS-Cov-2 infection and should not be used as the sole basis for treatment or other patient management decisions. A negative result may occur with  improper specimen collection/handling, submission of specimen other than nasopharyngeal swab, presence of viral mutation(s) within the areas targeted by this assay, and inadequate number of viral copies (<131 copies/mL). A negative result must be combined with clinical observations, patient history, and epidemiological information. The expected result is Negative.  Fact Sheet for Patients:  PinkCheek.be  Fact Sheet for Healthcare Providers:  GravelBags.it  This test is no t yet approved or cleared by the [place] FDA and  has been authorized for detection and/or diagnosis of SARS-CoV-2 by FDA under an Emergency Use Authorization (EUA). This EUA will remain  in effect (meaning this test can be used) for the duration of the COVID-19 declaration  under Section 564(b)(1) of the Act, 21 U.S.C. section 360bbb-3(b)(1), unless the authorization is terminated or revoked sooner.     Influenza A by PCR NEGATIVE NEGATIVE   Influenza B by PCR NEGATIVE NEGATIVE    Comment: (NOTE) The Xpert Xpress SARS-CoV-2/FLU/RSV assay is intended as an aid in  the diagnosis of influenza from Nasopharyngeal swab specimens and  should not be used as a sole basis for treatment. Nasal washings and  aspirates are unacceptable for Xpert Xpress SARS-CoV-2/FLU/RSV  testing.  Fact  Sheet for Patients: PinkCheek.be  Fact Sheet for Healthcare Providers: GravelBags.it  This test is not yet approved or cleared by the [place] FDA and  has been authorized for detection and/or diagnosis of SARS-CoV-2 by  FDA under an Emergency Use Authorization (EUA). This EUA will remain  in effect (meaning this test can be used) for the duration of the  Covid-19 declaration under Section 564(b)(1) of the Act, 21  U.S.C. section 360bbb-3(b)(1), unless the authorization is  terminated or revoked. Performed at [facility], [place] [address] [place], [place] [address]    DG Chest Port 1 View  Result Date: 04/30/2020 CLINICAL DATA:  Hip fracture, preop evaluation EXAM: PORTABLE CHEST 1 VIEW COMPARISON:  None. FINDINGS: The heart size and mediastinal contours are within normal limits. Slight, likely chronic interstitial prominence. No pleural effusion. The visualized skeletal structures are unremarkable. IMPRESSION: No acute process in the chest. Electronically Signed   By: Macy Mis M.D.   On: 04/30/2020 20:09   DG Hip Unilat W or Wo Pelvis 2-3 Views Right  Result Date: 04/30/2020 CLINICAL DATA:  Fall from second run of stepladder and onto right hip EXAM: DG HIP (WITH OR WITHOUT PELVIS) 2-3V RIGHT COMPARISON:  CT 12/10/2016 FINDINGS: There is a subcapital fracture dislocation of the right  proximal femur with slight anterior displacement and varus angulation across the fracture line. Right femoral head remains normally located in the acetabulum. Proximal left femur is intact and normally located. Bones of the pelvis appear intact and congruent. Right lateral hip swelling. Degenerative changes present in the spine, hips and pelvis. No other acute osseous or soft tissue abnormality. IMPRESSION: Mildly displaced and angulated subcapital fracture of the right proximal femur. Electronically Signed   By: Lovena Le M.D.   On: 04/30/2020 19:51    Pending Labs Unresulted Labs (From admission, onward)          Start     Ordered   04/30/20 2020  ABO/Rh  Once,   R        04/30/20 2020   Signed and Held  CBC  Tomorrow morning,   R        Signed and Held   Signed and Held  Comprehensive metabolic panel  Tomorrow morning,   R        Signed and Held          Vitals/Pain Today's Vitals   04/30/20 2200 04/30/20 2230 04/30/20 2300 04/30/20 2330  BP: (!) 164/64 (!) 167/69 (!) 170/62 (!) 176/73  Pulse: 72 78 80 81  Resp: 12 15 14 16   Temp:      TempSrc:      SpO2: 100% 97% 96% 95%  Weight:      Height:      PainSc:        Isolation Precautions No active isolations  Medications Medications  0.9 %  sodium chloride infusion ( Intravenous New Bag/Given 04/30/20 2023)  fentaNYL (SUBLIMAZE) injection 50 mcg (has no administration in time range)  fentaNYL (SUBLIMAZE) injection 50 mcg (50 mcg Intravenous Given 04/30/20 1924)    Mobility non-ambulatory

## 2020-05-02 ENCOUNTER — Encounter (HOSPITAL_COMMUNITY)
Admission: EM | Disposition: A | Discharge status: Home Health | Payer: Self-pay | Source: Home / Self Care | Attending: Family Medicine

## 2020-05-02 ENCOUNTER — Inpatient Hospital Stay (HOSPITAL_COMMUNITY): Payer: Medicare Other | Admitting: Anesthesiology

## 2020-05-02 ENCOUNTER — Inpatient Hospital Stay (HOSPITAL_COMMUNITY): Payer: Medicare Other

## 2020-05-02 ENCOUNTER — Encounter (HOSPITAL_COMMUNITY): Payer: Self-pay | Admitting: Internal Medicine

## 2020-05-02 DIAGNOSIS — S72001A Fracture of unspecified part of neck of right femur, initial encounter for closed fracture: Secondary | ICD-10-CM | POA: Diagnosis not present

## 2020-05-02 DIAGNOSIS — F411 Generalized anxiety disorder: Secondary | ICD-10-CM | POA: Diagnosis not present

## 2020-05-02 DIAGNOSIS — I10 Essential (primary) hypertension: Secondary | ICD-10-CM | POA: Diagnosis not present

## 2020-05-02 HISTORY — PX: TOTAL HIP ARTHROPLASTY: SHX124

## 2020-05-02 SURGERY — ARTHROPLASTY, HIP, TOTAL, ANTERIOR APPROACH
Anesthesia: Spinal | Site: Hip | Laterality: Right

## 2020-05-02 MED ORDER — ENSURE SURGERY PO LIQD
237.0000 mL | ORAL | Status: DC
  Administered 2020-05-03 – 2020-05-04 (×2): 237 mL via ORAL
  Filled 2020-05-02: qty 237

## 2020-05-02 MED ORDER — LIDOCAINE 2% (20 MG/ML) 5 ML SYRINGE
INTRAMUSCULAR | Status: AC
  Filled 2020-05-02: qty 5

## 2020-05-02 MED ORDER — METOCLOPRAMIDE HCL 5 MG PO TABS
5.0000 mg | ORAL_TABLET | Freq: Three times a day (TID) | ORAL | Status: DC | PRN
  Filled 2020-05-02: qty 2

## 2020-05-02 MED ORDER — PROPOFOL 10 MG/ML IV BOLUS
INTRAVENOUS | Status: DC | PRN
  Administered 2020-05-02: 100 mg via INTRAVENOUS

## 2020-05-02 MED ORDER — 0.9 % SODIUM CHLORIDE (POUR BTL) OPTIME
TOPICAL | Status: DC | PRN
  Administered 2020-05-02: 1000 mL

## 2020-05-02 MED ORDER — WATER FOR IRRIGATION, STERILE IR SOLN
Status: DC | PRN
  Administered 2020-05-02: 2000 mL

## 2020-05-02 MED ORDER — ONDANSETRON HCL 4 MG/2ML IJ SOLN
INTRAMUSCULAR | Status: DC | PRN
  Administered 2020-05-02: 4 mg via INTRAVENOUS

## 2020-05-02 MED ORDER — HYDROCODONE-ACETAMINOPHEN 7.5-325 MG PO TABS: 1.0000 | ORAL_TABLET | ORAL | Status: DC | PRN

## 2020-05-02 MED ORDER — PHENOL 1.4 % MT LIQD
1.0000 | OROMUCOSAL | Status: DC | PRN
  Filled 2020-05-02: qty 177

## 2020-05-02 MED ORDER — SENNA 8.6 MG PO TABS
1.0000 | ORAL_TABLET | Freq: Two times a day (BID) | ORAL | Status: DC
  Administered 2020-05-03 – 2020-05-04 (×2): 8.6 mg via ORAL
  Filled 2020-05-02 (×2): qty 1

## 2020-05-02 MED ORDER — PROMETHAZINE HCL 25 MG/ML IJ SOLN: 6.2500 mg | INTRAMUSCULAR | Status: DC | PRN

## 2020-05-02 MED ORDER — MORPHINE SULFATE (PF) 2 MG/ML IV SOLN: 0.5000 mg | INTRAVENOUS | Status: DC | PRN

## 2020-05-02 MED ORDER — PHENYLEPHRINE 40 MCG/ML (10ML) SYRINGE FOR IV PUSH (FOR BLOOD PRESSURE SUPPORT)
PREFILLED_SYRINGE | INTRAVENOUS | Status: AC
  Filled 2020-05-02: qty 10

## 2020-05-02 MED ORDER — IRRISEPT - 450ML BOTTLE WITH 0.05% CHG IN STERILE WATER, USP 99.95% OPTIME
TOPICAL | Status: DC | PRN
  Administered 2020-05-02: 450 mL

## 2020-05-02 MED ORDER — METHOCARBAMOL 500 MG IVPB - SIMPLE MED
500.0000 mg | Freq: Four times a day (QID) | INTRAVENOUS | Status: DC | PRN
  Filled 2020-05-02: qty 50

## 2020-05-02 MED ORDER — BUPIVACAINE-EPINEPHRINE 0.25% -1:200000 IJ SOLN
INTRAMUSCULAR | Status: DC | PRN
  Administered 2020-05-02: 30 mL

## 2020-05-02 MED ORDER — ONDANSETRON HCL 4 MG/2ML IJ SOLN: 4.0000 mg | Freq: Once | INTRAMUSCULAR | Status: DC

## 2020-05-02 MED ORDER — POVIDONE-IODINE 10 % EX SWAB: 2.0000 "application " | Freq: Once | CUTANEOUS | Status: DC

## 2020-05-02 MED ORDER — SODIUM CHLORIDE (PF) 0.9 % IJ SOLN
INTRAMUSCULAR | Status: DC | PRN
  Administered 2020-05-02: 30 mL

## 2020-05-02 MED ORDER — ENOXAPARIN SODIUM 40 MG/0.4ML ~~LOC~~ SOLN
40.0000 mg | SUBCUTANEOUS | Status: DC
  Administered 2020-05-03 – 2020-05-04 (×2): 40 mg via SUBCUTANEOUS
  Filled 2020-05-02 (×2): qty 0.4

## 2020-05-02 MED ORDER — ROCURONIUM BROMIDE 10 MG/ML (PF) SYRINGE
PREFILLED_SYRINGE | INTRAVENOUS | Status: AC
  Filled 2020-05-02: qty 10

## 2020-05-02 MED ORDER — METOCLOPRAMIDE HCL 5 MG/ML IJ SOLN: 5.0000 mg | Freq: Three times a day (TID) | INTRAMUSCULAR | Status: DC | PRN

## 2020-05-02 MED ORDER — SODIUM CHLORIDE 0.9 % IR SOLN
Status: DC | PRN
  Administered 2020-05-02: 1000 mL

## 2020-05-02 MED ORDER — CEFAZOLIN SODIUM-DEXTROSE 2-4 GM/100ML-% IV SOLN: 2.0000 g | Freq: Four times a day (QID) | INTRAVENOUS | Status: DC

## 2020-05-02 MED ORDER — ACETAMINOPHEN 325 MG PO TABS: 325.0000 mg | ORAL_TABLET | Freq: Four times a day (QID) | ORAL | Status: DC | PRN

## 2020-05-02 MED ORDER — FENTANYL CITRATE (PF) 100 MCG/2ML IJ SOLN: 25.0000 ug | INTRAMUSCULAR | Status: DC | PRN

## 2020-05-02 MED ORDER — KETOROLAC TROMETHAMINE 15 MG/ML IJ SOLN
7.5000 mg | Freq: Four times a day (QID) | INTRAMUSCULAR | Status: AC
  Administered 2020-05-03 (×4): 7.5 mg via INTRAVENOUS
  Filled 2020-05-02 (×4): qty 1

## 2020-05-02 MED ORDER — ONDANSETRON HCL 4 MG/2ML IJ SOLN
INTRAMUSCULAR | Status: AC
  Filled 2020-05-02: qty 2

## 2020-05-02 MED ORDER — METHOCARBAMOL 500 MG PO TABS: 500.0000 mg | ORAL_TABLET | Freq: Four times a day (QID) | ORAL | Status: DC | PRN

## 2020-05-02 MED ORDER — KETOROLAC TROMETHAMINE 30 MG/ML IJ SOLN
INTRAMUSCULAR | Status: DC | PRN
  Administered 2020-05-02: 30 mg

## 2020-05-02 MED ORDER — ISOPROPYL ALCOHOL 70 % SOLN
Status: DC | PRN
  Administered 2020-05-02: 1 via TOPICAL

## 2020-05-02 MED ORDER — SUGAMMADEX SODIUM 200 MG/2ML IV SOLN
INTRAVENOUS | Status: DC | PRN
  Administered 2020-05-02: 150 mg via INTRAVENOUS

## 2020-05-02 MED ORDER — PHENYLEPHRINE 40 MCG/ML (10ML) SYRINGE FOR IV PUSH (FOR BLOOD PRESSURE SUPPORT)
PREFILLED_SYRINGE | INTRAVENOUS | Status: DC | PRN
  Administered 2020-05-02: 80 ug via INTRAVENOUS

## 2020-05-02 MED ORDER — BUPIVACAINE-EPINEPHRINE 0.25% -1:200000 IJ SOLN
INTRAMUSCULAR | Status: AC
  Filled 2020-05-02: qty 1

## 2020-05-02 MED ORDER — SODIUM CHLORIDE (PF) 0.9 % IJ SOLN
INTRAMUSCULAR | Status: AC
  Filled 2020-05-02: qty 50

## 2020-05-02 MED ORDER — ONDANSETRON HCL 4 MG/2ML IJ SOLN: 4.0000 mg | Freq: Four times a day (QID) | INTRAMUSCULAR | Status: DC | PRN

## 2020-05-02 MED ORDER — HYDROMORPHONE HCL 2 MG/ML IJ SOLN
INTRAMUSCULAR | Status: AC
  Filled 2020-05-02: qty 1

## 2020-05-02 MED ORDER — CEFAZOLIN SODIUM-DEXTROSE 2-4 GM/100ML-% IV SOLN
2.0000 g | Freq: Four times a day (QID) | INTRAVENOUS | Status: AC
  Administered 2020-05-02 – 2020-05-03 (×2): 2 g via INTRAVENOUS
  Filled 2020-05-02 (×2): qty 100

## 2020-05-02 MED ORDER — ROCURONIUM BROMIDE 10 MG/ML (PF) SYRINGE
PREFILLED_SYRINGE | INTRAVENOUS | Status: DC | PRN
  Administered 2020-05-02: 50 mg via INTRAVENOUS

## 2020-05-02 MED ORDER — PROSOURCE PLUS PO LIQD
30.0000 mL | Freq: Every day | ORAL | Status: DC
  Administered 2020-05-03: 30 mL via ORAL
  Filled 2020-05-02 (×2): qty 30

## 2020-05-02 MED ORDER — DEXAMETHASONE SODIUM PHOSPHATE 10 MG/ML IJ SOLN
INTRAMUSCULAR | Status: AC
  Filled 2020-05-02: qty 1

## 2020-05-02 MED ORDER — HYDROCODONE-ACETAMINOPHEN 5-325 MG PO TABS
1.0000 | ORAL_TABLET | ORAL | Status: DC | PRN
  Administered 2020-05-03: 1 via ORAL
  Administered 2020-05-03: 2 via ORAL
  Administered 2020-05-03: 1 via ORAL
  Administered 2020-05-04 (×2): 2 via ORAL
  Filled 2020-05-02: qty 1
  Filled 2020-05-02 (×2): qty 2
  Filled 2020-05-02: qty 1
  Filled 2020-05-02: qty 2

## 2020-05-02 MED ORDER — DEXAMETHASONE SODIUM PHOSPHATE 10 MG/ML IJ SOLN
INTRAMUSCULAR | Status: DC | PRN
  Administered 2020-05-02: 8 mg via INTRAVENOUS

## 2020-05-02 MED ORDER — ONDANSETRON HCL 4 MG PO TABS
4.0000 mg | ORAL_TABLET | Freq: Four times a day (QID) | ORAL | Status: DC | PRN
  Filled 2020-05-02 (×2): qty 1

## 2020-05-02 MED ORDER — KETOROLAC TROMETHAMINE 30 MG/ML IJ SOLN
INTRAMUSCULAR | Status: AC
  Filled 2020-05-02: qty 1

## 2020-05-02 MED ORDER — HYDROMORPHONE HCL 1 MG/ML IJ SOLN
INTRAMUSCULAR | Status: DC | PRN
Start: 2020-05-02 — End: 2020-05-02
  Administered 2020-05-02 (×2): 1 mg via INTRAVENOUS

## 2020-05-02 MED ORDER — MENTHOL 3 MG MT LOZG
1.0000 | LOZENGE | OROMUCOSAL | Status: DC | PRN
  Filled 2020-05-02 (×2): qty 9

## 2020-05-02 MED ORDER — LIDOCAINE 2% (20 MG/ML) 5 ML SYRINGE
INTRAMUSCULAR | Status: DC | PRN
  Administered 2020-05-02: 60 mg via INTRAVENOUS

## 2020-05-02 MED ORDER — FENTANYL CITRATE (PF) 100 MCG/2ML IJ SOLN
INTRAMUSCULAR | Status: DC | PRN
Start: 2020-05-02 — End: 2020-05-02
  Administered 2020-05-02 (×2): 50 ug via INTRAVENOUS

## 2020-05-02 MED ORDER — FENTANYL CITRATE (PF) 100 MCG/2ML IJ SOLN
INTRAMUSCULAR | Status: AC
  Filled 2020-05-02: qty 2

## 2020-05-02 MED ORDER — SODIUM CHLORIDE 0.9 % IR SOLN
Status: DC | PRN
  Administered 2020-05-02: 3000 mL

## 2020-05-02 MED ORDER — DOCUSATE SODIUM 100 MG PO CAPS
100.0000 mg | ORAL_CAPSULE | Freq: Two times a day (BID) | ORAL | Status: DC
  Administered 2020-05-02 – 2020-05-04 (×4): 100 mg via ORAL
  Filled 2020-05-02 (×4): qty 1

## 2020-05-02 SURGICAL SUPPLY — 66 items
ADH SKN CLS APL DERMABOND .7 (GAUZE/BANDAGES/DRESSINGS) ×2
APL PRP STRL LF DISP 70% ISPRP (MISCELLANEOUS) ×1
BAG DECANTER FOR FLEXI CONT (MISCELLANEOUS) IMPLANT
BAG SPEC THK2 15X12 ZIP CLS (MISCELLANEOUS)
BAG ZIPLOCK 12X15 (MISCELLANEOUS) IMPLANT
BLADE SURG SZ10 CARB STEEL (BLADE) IMPLANT
CHLORAPREP W/TINT 26 (MISCELLANEOUS) ×3 IMPLANT
COVER PERINEAL POST (MISCELLANEOUS) ×3 IMPLANT
COVER SURGICAL LIGHT HANDLE (MISCELLANEOUS) ×3 IMPLANT
COVER WAND RF STERILE (DRAPES) IMPLANT
CUP SECTOR GRIPTON 50MM (Cup) ×2 IMPLANT
DECANTER SPIKE VIAL GLASS SM (MISCELLANEOUS) ×3 IMPLANT
DERMABOND ADVANCED (GAUZE/BANDAGES/DRESSINGS) ×4
DERMABOND ADVANCED .7 DNX12 (GAUZE/BANDAGES/DRESSINGS) ×2 IMPLANT
DRAPE IMP U-DRAPE 54X76 (DRAPES) ×3 IMPLANT
DRAPE SHEET LG 3/4 BI-LAMINATE (DRAPES) ×9 IMPLANT
DRAPE STERI IOBAN 125X83 (DRAPES) IMPLANT
DRAPE U-SHAPE 47X51 STRL (DRAPES) ×6 IMPLANT
DRSG AQUACEL AG ADV 3.5X10 (GAUZE/BANDAGES/DRESSINGS) ×3 IMPLANT
ELECT REM PT RETURN 15FT ADLT (MISCELLANEOUS) ×3 IMPLANT
GAUZE SPONGE 4X4 12PLY STRL (GAUZE/BANDAGES/DRESSINGS) ×3 IMPLANT
GLOVE BIO SURGEON STRL SZ8.5 (GLOVE) ×6 IMPLANT
GLOVE BIOGEL M STRL SZ7.5 (GLOVE) ×6 IMPLANT
GLOVE BIOGEL PI IND STRL 8 (GLOVE) ×1 IMPLANT
GLOVE BIOGEL PI IND STRL 8.5 (GLOVE) ×1 IMPLANT
GLOVE BIOGEL PI INDICATOR 8 (GLOVE) ×2
GLOVE BIOGEL PI INDICATOR 8.5 (GLOVE) ×2
GOWN SPEC L3 XXLG W/TWL (GOWN DISPOSABLE) ×3 IMPLANT
GOWN STRL REUS W/ TWL LRG LVL3 (GOWN DISPOSABLE) ×1 IMPLANT
GOWN STRL REUS W/TWL LRG LVL3 (GOWN DISPOSABLE) ×3
HANDPIECE INTERPULSE COAX TIP (DISPOSABLE) ×3
HEAD FEM STD 32X+1 STRL (Hips) ×2 IMPLANT
HOLDER FOLEY CATH W/STRAP (MISCELLANEOUS) ×3 IMPLANT
HOOD PEEL AWAY FLYTE STAYCOOL (MISCELLANEOUS) ×12 IMPLANT
JET LAVAGE IRRISEPT WOUND (IRRIGATION / IRRIGATOR) ×3
KIT TURNOVER KIT A (KITS) IMPLANT
LAVAGE JET IRRISEPT WOUND (IRRIGATION / IRRIGATOR) ×1 IMPLANT
LINER ACET PNNCL PLUS4 NEUTRAL (Hips) IMPLANT
MANIFOLD NEPTUNE II (INSTRUMENTS) ×3 IMPLANT
MARKER SKIN DUAL TIP RULER LAB (MISCELLANEOUS) ×3 IMPLANT
NDL SAFETY ECLIPSE 18X1.5 (NEEDLE) ×1 IMPLANT
NDL SPNL 18GX3.5 QUINCKE PK (NEEDLE) ×1 IMPLANT
NEEDLE HYPO 18GX1.5 SHARP (NEEDLE) ×3
NEEDLE SPNL 18GX3.5 QUINCKE PK (NEEDLE) ×3 IMPLANT
PACK ANTERIOR HIP CUSTOM (KITS) ×3 IMPLANT
PENCIL SMOKE EVACUATOR (MISCELLANEOUS) IMPLANT
PINNACLE PLUS 4 NEUTRAL (Hips) ×3 IMPLANT
SAW OSC TIP CART 19.5X105X1.3 (SAW) ×3 IMPLANT
SEALER BIPOLAR AQUA 6.0 (INSTRUMENTS) ×3 IMPLANT
SET HNDPC FAN SPRY TIP SCT (DISPOSABLE) ×1 IMPLANT
STEM TRI LOC BPS SZ4 W GRIPTON (Hips) IMPLANT
SUT ETHIBOND NAB CT1 #1 30IN (SUTURE) ×6 IMPLANT
SUT MNCRL AB 3-0 PS2 18 (SUTURE) ×3 IMPLANT
SUT MNCRL AB 4-0 PS2 18 (SUTURE) ×3 IMPLANT
SUT MON AB 2-0 CT1 36 (SUTURE) ×6 IMPLANT
SUT STRATAFIX PDO 1 14 VIOLET (SUTURE) ×3
SUT STRATFX PDO 1 14 VIOLET (SUTURE) ×1
SUT VIC AB 2-0 CT1 27 (SUTURE) ×3
SUT VIC AB 2-0 CT1 TAPERPNT 27 (SUTURE) ×1 IMPLANT
SUTURE STRATFX PDO 1 14 VIOLET (SUTURE) ×1 IMPLANT
SYR 3ML LL SCALE MARK (SYRINGE) ×3 IMPLANT
TRAY FOLEY MTR SLVR 14FR STAT (SET/KITS/TRAYS/PACK) ×2 IMPLANT
TRAY FOLEY MTR SLVR 16FR STAT (SET/KITS/TRAYS/PACK) IMPLANT
TRI LOC BPS SZ 4 W GRIPTON (Hips) ×3 IMPLANT
TUBE SUCTION HIGH CAP CLEAR NV (SUCTIONS) ×3 IMPLANT
WATER STERILE IRR 1000ML POUR (IV SOLUTION) ×3 IMPLANT

## 2020-05-02 NOTE — Anesthesia Preprocedure Evaluation (Addendum)
Anesthesia Evaluation  Patient identified by MRN, date of birth, ID band Patient awake    Reviewed: Allergy & Precautions, NPO status , Patient's Chart, lab work & pertinent test results  Airway Mallampati: III  TM Distance: >3 FB Neck ROM: Full    Dental no notable dental hx.    Pulmonary former smoker,    Pulmonary exam normal breath sounds clear to auscultation       Cardiovascular Exercise Tolerance: Good hypertension,  Rhythm:Regular Rate:Normal  Sinus or ectopic atrial rhythm Left bundle branch block Confirmed by Davonna Belling [phone]) on 04/30/2020 10:00:28 PM  Stress test 08/29/19:  Nuclear stress EF: 46%.  There was no ST segment deviation noted during stress.  This is a low risk study.  The left ventricular ejection fraction is mildly decreased (45-54%).   No ischemia or infarct ECG with LBBB EF estimated at 46% abnormal septal motion and apical hypokinesis Suggest echo correlation     Neuro/Psych PSYCHIATRIC DISORDERS Anxiety Depression    GI/Hepatic Neg liver ROS, GERD  ,  Endo/Other  negative endocrine ROS  Renal/GU negative Renal ROS  negative genitourinary   Musculoskeletal  (+) Arthritis , Osteoarthritis,    Abdominal Normal abdominal exam  (+)   Peds negative pediatric ROS (+)  Hematology  (+) anemia ,   Anesthesia Other Findings   Reproductive/Obstetrics negative OB ROS                            Anesthesia Physical Anesthesia Plan  ASA: III  Anesthesia Plan: Spinal   Post-op Pain Management:    Induction: Intravenous  PONV Risk Score and Plan: 2 and Propofol infusion, TIVA, Treatment may vary due to age or medical condition and Ondansetron  Airway Management Planned: Oral ETT  Additional Equipment: None  Intra-op Plan:   Post-operative Plan:   Informed Consent: I have reviewed the patients History and Physical, chart, labs and discussed the  procedure including the risks, benefits and alternatives for the proposed anesthesia with the patient or authorized representative who has indicated his/her understanding and acceptance.     Dental advisory given  Plan Discussed with: CRNA and Anesthesiologist  Anesthesia Plan Comments: (Had a long discussion with patient re: spinal vs. GETA. Patient ultimately decided on GETA after risks/benefits of both were explained. Norton Blizzard, MD  )       Anesthesia Quick Evaluation

## 2020-05-02 NOTE — Interval H&P Note (Signed)
History and Physical Interval Note:  05/02/2020 2:00 PM  Patricia Garcia  has presented today for surgery, with the diagnosis of right femoral neck fracture.  The various methods of treatment have been discussed with the patient and family. After consideration of risks, benefits and other options for treatment, the patient has consented to  Procedure(s): TOTAL HIP ARTHROPLASTY ANTERIOR APPROACH (Right) as a surgical intervention.  The patient's history has been reviewed, patient examined, no change in status, stable for surgery.  I have reviewed the patient's chart and labs.  Questions were answered to the patient's satisfaction.     Hilton Cork Keyler Hoge

## 2020-05-02 NOTE — Anesthesia Procedure Notes (Signed)
Procedure Name: Intubation Date/Time: 05/02/2020 3:47 PM Performed by: Timm Bonenberger D, CRNA Pre-anesthesia Checklist: Patient identified, Emergency Drugs available, Suction available and Patient being monitored Patient Re-evaluated:Patient Re-evaluated prior to induction Oxygen Delivery Method: Circle system utilized Preoxygenation: Pre-oxygenation with 100% oxygen Induction Type: IV induction Ventilation: Mask ventilation without difficulty Laryngoscope Size: Mac and 3 Grade View: Grade I Tube type: Oral Tube size: 7.0 mm Number of attempts: 1 Airway Equipment and Method: Stylet Placement Confirmation: ETT inserted through vocal cords under direct vision,  positive ETCO2 and breath sounds checked- equal and bilateral Secured at: 21 cm Tube secured with: Tape Dental Injury: Teeth and Oropharynx as per pre-operative assessment

## 2020-05-02 NOTE — Transfer of Care (Signed)
Immediate Anesthesia Transfer of Care Note  Patient: Patricia Garcia  Procedure(s) Performed: TOTAL HIP ARTHROPLASTY ANTERIOR APPROACH (Right Hip)  Patient Location: PACU  Anesthesia Type:General  Level of Consciousness: awake, oriented and sedated  Airway & Oxygen Therapy: Patient Spontanous Breathing and Patient connected to face mask oxygen  Post-op Assessment: Report given to RN and Post -op Vital signs reviewed and stable  Post vital signs: Reviewed and stable  Last Vitals:  Vitals Value Taken Time  BP 154/80 05/02/20 1804  Temp    Pulse 119 05/02/20 1812  Resp 13 05/02/20 1812  SpO2 92 % 05/02/20 1812  Vitals shown include unvalidated device data.  Last Pain:  Vitals:   05/02/20 1804  TempSrc:   PainSc: (P) 0-No pain      Patients Stated Pain Goal: 3 (38/93/73 4287)  Complications: No complications documented.

## 2020-05-02 NOTE — Progress Notes (Signed)
Initial Nutrition Assessment  DOCUMENTATION CODES:   Not applicable  INTERVENTION:  - will order Ensure Surgery once/day, each supplement provides 330 kcal and 18 grams protein. - will order 30 ml Prosource Plus once/day, each supplement provides 100 kcal and 15 grams protein.  - will complete NFPE at follow-up.   NUTRITION DIAGNOSIS:   Increased nutrient needs related to post-op healing as evidenced by estimated needs.  GOAL:   Patient will meet greater than or equal to 90% of their needs  MONITOR:   Diet advancement, PO intake, Supplement acceptance, Labs, Weight trends, Skin  REASON FOR ASSESSMENT:   Malnutrition Screening Tool  ASSESSMENT:   86 y.o. female with medical history of HTN, anxiety disorder, ischemic cardiomyopathy, osteopenia, and diverticulosis. She fell from a 2-3 foot tall stepladder at home and had subsequent R-sided pain; did not hit her head or lose consciousness. She was found to have a R femoral neck fx.  Patient is currently in the OR for R femoral neck fx fixation. Diet advanced from NPO to Regular yesterday at 0835 and then changed back to NPO at midnight. She ate 15% of breakfast and 100% of lunch yesterday.   Weight yesterday was 126 lb and PTA the most recently documented weight was on 08/29/19 when she weighed 132 lb. This indicates 6 lb weight loss (4.5% body weight) in the past 8 months; not significant for time frame but unable to determine if weight loss occurred more acutely.    Labs reviewed; Ca: 8.6 mg/dl. Medications reviewed; 1000 units cholecalciferol/day, 40 mg oral pepcid/day, 325 mg ferrous sulfate MWF, 1 tablet multivitamin with minerals + iron/day, 1 g lovaza/day. IVF; NS @ 75 ml/hr.    NUTRITION - FOCUSED PHYSICAL EXAM:  unable to complete at this time.  Diet Order:   Diet Order            Diet NPO time specified  Diet effective midnight                 EDUCATION NEEDS:   No education needs have been identified at  this time  Skin:  Skin Assessment: Reviewed RN Assessment  Last BM:  10/10  Height:   Ht Readings from Last 1 Encounters:  05/01/20 5\' 3"  (1.6 m)    Weight:   Wt Readings from Last 1 Encounters:  05/01/20 57.3 kg     Estimated Nutritional Needs:  Kcal:  1720-1890 kcal Protein:  80-90 grams Fluid:  >/= 1.8 L/day     Jarome Matin, MS, RD, LDN, CNSC Inpatient Clinical Dietitian RD pager # available in [facility]  After hours/weekend pager # available in [facility]

## 2020-05-02 NOTE — Progress Notes (Signed)
[facility]  PROGRESS NOTE  KERILYN CORTNER KZS:[id number] DOB: 12-04-1932 DOA: 04/30/2020 PCP: Lajean Manes, MD   Brief HPI:   86 year old female with history of hypertension, anxiety disorder, ischemic cardiomyopathy, osteopenia, diverticulosis who fell off 2 to 3 foot stepladder at home and sustained mechanical fall.  Patient fell to the right and sustained a right femoral neck fracture.  Patient did not pass out, did not hit her head.  Orthopedics was consulted.    Subjective   Patient seen and examined, denies any pain.   Assessment/Plan:     1. Right femur fracture-secondary to fall at home, orthopedics has seen the patient plan for ORIF today. 2. Osteopenia-calcium, vitamin D postop. 3. Ischemic cardiomyopathy-patient has remote history of ischemic cardiopathy, no cardiac history or MI.  No new stent placement. 4. Anxiety-continue Xanax, Celexa 5. Hypertension-blood pressures controlled, continue clonidine 0.1 mg twice daily     COVID-19 Labs  No results for input(s): DDIMER, FERRITIN, LDH, CRP in the last 72 hours.  Lab Results  Component Value Date   [place] NEGATIVE 04/30/2020     Scheduled medications:   . (feeding supplement) PROSource Plus  30 mL Oral Daily  . chlorhexidine  60 mL Topical On Call to OR  . [MAR Hold] cholecalciferol  1,000 Units Oral Daily  . [MAR Hold] citalopram  20 mg Oral Daily  . [MAR Hold] cloNIDine  0.1 mg Oral BID  . [MAR Hold] famotidine  40 mg Oral Daily  . [START ON 05/03/2020] feeding supplement  237 mL Oral Q24H  . [MAR Hold] ferrous sulfate  325 mg Oral Q M,W,F  . [MAR Hold] latanoprost  1 drop Both Eyes QHS  . [MAR Hold] multivitamin-iron-minerals-folic acid  1 tablet Oral Daily  . [MAR Hold] mupirocin ointment  1 application Nasal BID  . [MAR Hold] omega-3 acid ethyl esters  1 g Oral Daily  . povidone-iodine  2 application Topical Once         CBG: No results for input(s): GLUCAP in the last 168  hours.  SpO2: 93 % O2 Flow Rate (L/min): 2 L/min    CBC: Recent Labs  Lab 04/30/20 2021 05/01/20 0328  WBC 14.5* 9.6  NEUTROABS 12.1*  --   HGB 11.9* 11.3*  HCT 37.1 34.9*  MCV 90.0 89.9  PLT 333 732    Basic Metabolic Panel: Recent Labs  Lab 04/30/20 2021 05/01/20 0328  NA 141 139  K 4.0 3.9  CL 106 107  CO2 26 24  GLUCOSE 133* 130*  BUN 13 13  CREATININE 0.94 0.83  CALCIUM 9.1 8.6*     Liver Function Tests: Recent Labs  Lab 05/01/20 0328  AST 28  ALT 21  ALKPHOS 50  BILITOT 0.9  PROT 6.8  ALBUMIN 3.5     Antibiotics: Anti-infectives (From admission, onward)   Start     Dose/Rate Route Frequency Ordered Stop   05/02/20 1500  ceFAZolin (ANCEF) IVPB 2g/100 mL premix        2 g 200 mL/hr over 30 Minutes Intravenous On call to O.R. 05/01/20 1919 05/02/20 1550       DVT prophylaxis: SCDs  Code Status: Full code  Family Communication: No family at bedside    Status is: Inpatient  Dispo: The patient is from: Home              Anticipated d/c is to: Skilled nursing facility              Anticipated  d/c date is: 05/04/2020              Patient currently not medically stable for discharge  Barrier to discharge-plan for surgery today      Consultants:  Orthopedics  Procedures:     Objective   Vitals:   05/01/20 1355 05/01/20 2217 05/02/20 0612 05/02/20 1355  BP: (!) 116/46 (!) 149/57 (!) 139/53 (!) 180/73  Pulse: 81 72 72 85  Resp: 20 17 15 16   Temp: 98.9 F (37.2 C) 98.4 F (36.9 C) 98.2 F (36.8 C) 98 F (36.7 C)  TempSrc: Oral Oral Oral Oral  SpO2: 96% 94% 96% 93%  Weight:      Height:        Intake/Output Summary (Last 24 hours) at 05/02/2020 1638 Last data filed at 05/02/2020 1633 Gross per 24 hour  Intake 1843.42 ml  Output 2250 ml  Net -406.58 ml    10/11 1901 - 10/13 0700 In: 3605.3 [P.O.:960; I.V.:2645.3] Out: 2600 [Urine:2600]  Filed Weights   04/30/20 1744 05/01/20 0041  Weight: 56.7 kg 57.3 kg     Physical Examination:    General: Appears in no acute distress  Cardiovascular: S1-S2, regular  Respiratory: Clear to auscultation bilaterally  Abdomen: Abdomen is soft, nontender, no organomegaly  Extremities: No edema in the lower extremities  Neurologic: Alert, oriented x3, intact insight and judgment    Data Reviewed:   Recent Results (from the past 240 hour(s))  Respiratory Panel by RT PCR (Flu A&B, Covid) - Nasopharyngeal Swab     Status: None   Collection Time: 04/30/20  8:21 PM   Specimen: Nasopharyngeal Swab  Result Value Ref Range Status   SARS Coronavirus 2 by RT PCR NEGATIVE NEGATIVE Final    Comment: (NOTE) SARS-CoV-2 target nucleic acids are NOT DETECTED.  The SARS-CoV-2 RNA is generally detectable in upper respiratoy specimens during the acute phase of infection. The lowest concentration of SARS-CoV-2 viral copies this assay can detect is 131 copies/mL. A negative result does not preclude SARS-Cov-2 infection and should not be used as the sole basis for treatment or other patient management decisions. A negative result may occur with  improper specimen collection/handling, submission of specimen other than nasopharyngeal swab, presence of viral mutation(s) within the areas targeted by this assay, and inadequate number of viral copies (<131 copies/mL). A negative result must be combined with clinical observations, patient history, and epidemiological information. The expected result is Negative.  Fact Sheet for Patients:  PinkCheek.be  Fact Sheet for Healthcare Providers:  GravelBags.it  This test is no t yet approved or cleared by the [place] FDA and  has been authorized for detection and/or diagnosis of SARS-CoV-2 by FDA under an Emergency Use Authorization (EUA). This EUA will remain  in effect (meaning this test can be used) for the duration of the COVID-19 declaration under  Section 564(b)(1) of the Act, 21 U.S.C. section 360bbb-3(b)(1), unless the authorization is terminated or revoked sooner.     Influenza A by PCR NEGATIVE NEGATIVE Final   Influenza B by PCR NEGATIVE NEGATIVE Final    Comment: (NOTE) The Xpert Xpress SARS-CoV-2/FLU/RSV assay is intended as an aid in  the diagnosis of influenza from Nasopharyngeal swab specimens and  should not be used as a sole basis for treatment. Nasal washings and  aspirates are unacceptable for Xpert Xpress SARS-CoV-2/FLU/RSV  testing.  Fact Sheet for Patients: PinkCheek.be  Fact Sheet for Healthcare Providers: GravelBags.it  This test is not yet approved or cleared  by the [place] and  has been authorized for detection and/or diagnosis of SARS-CoV-2 by  FDA under an Emergency Use Authorization (EUA). This EUA will remain  in effect (meaning this test can be used) for the duration of the  Covid-19 declaration under Section 564(b)(1) of the Act, 21  U.S.C. section 360bbb-3(b)(1), unless the authorization is  terminated or revoked. Performed at [facility], [place] [address] [place], [place] [address]   Surgical PCR screen     Status: Abnormal   Collection Time: 05/01/20  1:15 AM   Specimen: Nasal Mucosa; Nasal Swab  Result Value Ref Range Status   MRSA, PCR NEGATIVE NEGATIVE Final   Staphylococcus aureus POSITIVE (A) NEGATIVE Final    Comment: (NOTE) The Xpert SA Assay (FDA approved for NASAL specimens in patients 69 years of age and older), is one component of a comprehensive surveillance program. It is not intended to diagnose infection nor to guide or monitor treatment. Performed at [facility], [place] [address] [place], [place] [address]     No results for input(s): LIPASE, AMYLASE in the last 168 hours. No results for input(s): AMMONIA in the last 168 hours.  Cardiac Enzymes: No results for  input(s): CKTOTAL, CKMB, CKMBINDEX, TROPONINI in the last 168 hours. BNP (last 3 results) No results for input(s): BNP in the last 8760 hours.  ProBNP (last 3 results) No results for input(s): PROBNP in the last 8760 hours.  Studies:  DG Chest Port 1 View  Result Date: 04/30/2020 CLINICAL DATA:  Hip fracture, preop evaluation EXAM: PORTABLE CHEST 1 VIEW COMPARISON:  None. FINDINGS: The heart size and mediastinal contours are within normal limits. Slight, likely chronic interstitial prominence. No pleural effusion. The visualized skeletal structures are unremarkable. IMPRESSION: No acute process in the chest. Electronically Signed   By: Macy Mis M.D.   On: 04/30/2020 20:09   DG Knee Right Port  Result Date: 05/01/2020 CLINICAL DATA:  Pain following recent fall EXAM: PORTABLE RIGHT KNEE - 1-2 VIEW COMPARISON:  None. FINDINGS: Frontal and lateral views were obtained. No fracture or dislocation. No joint effusion. Joint spaces appear normal. No erosive change. IMPRESSION: No fracture, dislocation, or joint effusion. No evident arthropathy. Electronically Signed   By: Lowella Grip III M.D.   On: 05/01/2020 08:26   DG Hip Unilat W or Wo Pelvis 2-3 Views Right  Result Date: 04/30/2020 CLINICAL DATA:  Fall from second run of stepladder and onto right hip EXAM: DG HIP (WITH OR WITHOUT PELVIS) 2-3V RIGHT COMPARISON:  CT 12/10/2016 FINDINGS: There is a subcapital fracture dislocation of the right proximal femur with slight anterior displacement and varus angulation across the fracture line. Right femoral head remains normally located in the acetabulum. Proximal left femur is intact and normally located. Bones of the pelvis appear intact and congruent. Right lateral hip swelling. Degenerative changes present in the spine, hips and pelvis. No other acute osseous or soft tissue abnormality. IMPRESSION: Mildly displaced and angulated subcapital fracture of the right proximal femur. Electronically  Signed   By: Lovena Le M.D.   On: 04/30/2020 19:51       Lamar   [facility] If 7PM-7AM, please contact night-coverage at [email or web address], Office  [phone]   05/02/2020, 4:38 PM  LOS: 2 days

## 2020-05-02 NOTE — Discharge Instructions (Signed)
°Dr. Sriyan Cutting °Joint Replacement Specialist °[facility] °[address] °[place], [place] [address] °[phone] ° ° °TOTAL HIP REPLACEMENT POSTOPERATIVE DIRECTIONS ° ° ° °Hip Rehabilitation, Guidelines Following Surgery  ° °WEIGHT BEARING °Weight bearing as tolerated with assist device (walker, cane, etc) as directed, use it as long as suggested by your surgeon or therapist, typically at least 4-6 weeks. ° °The results of a hip operation are greatly improved after range of motion and muscle strengthening exercises. Follow all safety measures which are given to protect your hip. If any of these exercises cause increased pain or swelling in your joint, decrease the amount until you are comfortable again. Then slowly increase the exercises. Call your caregiver if you have problems or questions.  ° °HOME CARE INSTRUCTIONS  °Most of the following instructions are designed to prevent the dislocation of your new hip.  °Remove items at home which could result in a fall. This includes throw rugs or furniture in walking pathways.  °Continue medications as instructed at time of discharge. °· You may have some home medications which will be placed on hold until you complete the course of blood thinner medication. °· You may start showering once you are discharged home. Do not remove your dressing. °Do not put on socks or shoes without following the instructions of your caregivers.   °Sit on chairs with arms. Use the chair arms to help push yourself up when arising.  °Arrange for the use of a toilet seat elevator so you are not sitting low.  °· Walk with walker as instructed.  °You may resume a sexual relationship in one month or when given the OK by your caregiver.  °Use walker as long as suggested by your caregivers.  °You may put full weight on your legs and walk as much as is comfortable. °Avoid periods of inactivity such as sitting longer than an hour when not asleep. This helps prevent  blood clots.  °You may return to work once you are cleared by your surgeon.  °Do not drive a car for 6 weeks or until released by your surgeon.  °Do not drive while taking narcotics.  °Wear elastic stockings for two weeks following surgery during the day but you may remove then at night.  °Make sure you keep all of your appointments after your operation with all of your doctors and caregivers. You should call the office at the above phone number and make an appointment for approximately two weeks after the date of your surgery. °Please pick up a stool softener and laxative for home use as long as you are requiring pain medications. °· ICE to the affected hip every three hours for 30 minutes at a time and then as needed for pain and swelling. Continue to use ice on the hip for pain and swelling from surgery. You may notice swelling that will progress down to the foot and ankle.  This is normal after surgery.  Elevate the leg when you are not up walking on it.   °It is important for you to complete the blood thinner medication as prescribed by your doctor. °· Continue to use the breathing machine which will help keep your temperature down.  It is common for your temperature to cycle up and down following surgery, especially at night when you are not up moving around and exerting yourself.  The breathing machine keeps your lungs expanded and your temperature down. ° °RANGE OF MOTION AND STRENGTHENING EXERCISES  °These exercises are   designed to help you keep full movement of your hip joint. Follow your caregiver's or physical therapist's instructions. Perform all exercises about fifteen times, three times per day or as directed. Exercise both hips, even if you have had only one joint replacement. These exercises can be done on a training (exercise) mat, on the floor, on a table or on a bed. Use whatever works the best and is most comfortable for you. Use music or television while you are exercising so that the exercises  are a pleasant break in your day. This will make your life better with the exercises acting as a break in routine you can look forward to.  °Lying on your back, slowly slide your foot toward your buttocks, raising your knee up off the floor. Then slowly slide your foot back down until your leg is straight again.  °Lying on your back spread your legs as far apart as you can without causing discomfort.  °Lying on your side, raise your upper leg and foot straight up from the floor as far as is comfortable. Slowly lower the leg and repeat.  °Lying on your back, tighten up the muscle in the front of your thigh (quadriceps muscles). You can do this by keeping your leg straight and trying to raise your heel off the floor. This helps strengthen the largest muscle supporting your knee.  °Lying on your back, tighten up the muscles of your buttocks both with the legs straight and with the knee bent at a comfortable angle while keeping your heel on the floor.  ° °SKILLED REHAB INSTRUCTIONS: °If the patient is transferred to a skilled rehab facility following release from the hospital, a list of the current medications will be sent to the facility for the patient to continue.  When discharged from the skilled rehab facility, please have the facility set up the patient's Home Health Physical Therapy prior to being released. Also, the skilled facility will be responsible for providing the patient with their medications at time of release from the facility to include their pain medication and their blood thinner medication. If the patient is still at the rehab facility at time of the two week follow up appointment, the skilled rehab facility will also need to assist the patient in arranging follow up appointment in our office and any transportation needs. ° °MAKE SURE YOU:  °Understand these instructions.  °Will watch your condition.  °Will get help right away if you are not doing well or get worse. ° °Pick up stool softner and  laxative for home use following surgery while on pain medications. °Do not remove your dressing. °The dressing is waterproof--it is OK to take showers. °Continue to use ice for pain and swelling after surgery. °Do not use any lotions or creams on the incision until instructed by your surgeon. °Total Hip Protocol. ° ° °

## 2020-05-02 NOTE — Op Note (Signed)
OPERATIVE REPORT  SURGEON: Rod Can, MD   ASSISTANT: Cherlynn June, PA-C  PREOPERATIVE DIAGNOSIS: Displaced Right femoral neck fracture.   POSTOPERATIVE DIAGNOSIS: Displaced Right femoral neck fracture.   PROCEDURE: Right total hip arthroplasty, anterior approach.   IMPLANTS: DePuy Tri Lock stem, size 4, hi offset. DePuy Pinnacle Cup, size 50 mm. DePuy Altrx liner, size 32 by 50 mm, +4 neutral. DePuy metal head ball, size 32 + 1 mm.  ANESTHESIA:  General  ANTIBIOTICS: 2g ancef.  ESTIMATED BLOOD LOSS: 250 mL.   DRAINS: None.  COMPLICATIONS: None   CONDITION: PACU - hemodynamically stable.   BRIEF CLINICAL NOTE: Patricia Garcia is a 86 y.o. female with a displaced Right femoral neck fracture. The patient was admitted to the hospitalist service and underwent perioperative risk stratification and medical optimization. The risks, benefits, and alternatives to total hip arthroplasty were explained, and the patient elected to proceed.  PROCEDURE IN DETAIL: The patient was taken to the operating room and general anesthesia was induced on the hospital bed.  The patient was then positioned on the Hana table.  All bony prominences were well padded.  The hip was prepped and draped in the normal sterile surgical fashion.  A time-out was called verifying side and site of surgery. Antibiotics were given within 60 minutes of beginning the procedure.  The direct anterior approach to the hip was performed through the Hueter interval.  Lateral femoral circumflex vessels were treated with the Auqumantys. The anterior capsule was exposed and an inverted T capsulotomy was made.  Fracture hematoma was encountered and evacuated. The patient was found to have a comminuted Right subcapital femoral neck fracture.  I freshened the femoral neck cut with a saw.  I removed the femoral neck fragment.  A corkscrew was placed into the head and the head was removed.  This was passed to the back table  and was measured.   Acetabular exposure was achieved, and the pulvinar and labrum were excised. Sequential reaming of the acetabulum was then performed up to a size 49 mm reamer. A 50 mm cup was then opened and impacted into place at approximately 40 degrees of abduction and 20 degrees of anteversion. The final polyethylene liner was impacted into place.    I then gained femoral exposure taking care to protect the abductors and greater trochanter.  This was performed using standard external rotation, extension, and adduction.  The capsule was peeled off the inner aspect of the greater trochanter, taking care to preserve the short external rotators. A cookie cutter was used to enter the femoral canal, and then the femoral canal finder was used to confirm location.  I then sequentially broached up to a size 4.  Calcar planer was used on the femoral neck remnant.  I paced a hi neck and a trial head ball. The hip was reduced.  Leg lengths were checked fluoroscopically.  The hip was dislocated and trial components were removed.  Please note that lengthening of the lower extremity was required to achieve adequate soft tissue tension and stability. I placed the real stem followed by the real spacer and head ball.  The hip was reduced.  Fluoroscopy was used to confirm component position and leg lengths.  At 90 degrees of external rotation and extension, the hip was stable to an anterior directed force.   The wound was copiously irrigated with Irrisept solution and normal saline using pule lavage.  Marcaine solution was injected into the periarticular soft tissue.  The wound  was closed in layers using #1 Vicryl and V-Loc for the fascia, 2-0 Vicryl for the subcutaneous fat, 2-0 Monocryl for the deep dermal layer, 3-0 running Monocryl subcuticular stitch and glue for the skin.  Once the glue was fully dried, an Aquacell Ag dressing was applied.  The patient was then awakened from anesthesia and transported to the  recovery room in stable condition.  Sponge, needle, and instrument counts were correct at the end of the case x2.  The patient tolerated the procedure well and there were no known complications.  Please note that a surgical assistant was a medical necessity for this procedure to perform it in a safe and expeditious manner. Assistant was necessary to provide appropriate retraction of vital neurovascular structures, to prevent femoral fracture, and to allow for anatomic placement of the prosthesis.

## 2020-05-03 ENCOUNTER — Encounter (HOSPITAL_COMMUNITY): Payer: Self-pay | Admitting: Orthopedic Surgery

## 2020-05-03 DIAGNOSIS — F411 Generalized anxiety disorder: Secondary | ICD-10-CM | POA: Diagnosis not present

## 2020-05-03 DIAGNOSIS — I10 Essential (primary) hypertension: Secondary | ICD-10-CM | POA: Diagnosis not present

## 2020-05-03 DIAGNOSIS — S72001A Fracture of unspecified part of neck of right femur, initial encounter for closed fracture: Secondary | ICD-10-CM | POA: Diagnosis not present

## 2020-05-03 LAB — CBC
HCT: 30.8 % — ABNORMAL LOW (ref 36.0–46.0)
Hemoglobin: 9.9 g/dL — ABNORMAL LOW (ref 12.0–15.0)
MCH: 28.8 pg (ref 26.0–34.0)
MCHC: 32.1 g/dL (ref 30.0–36.0)
MCV: 89.5 fL (ref 80.0–100.0)
Platelets: 217 10*3/uL (ref 150–400)
RBC: 3.44 MIL/uL — ABNORMAL LOW (ref 3.87–5.11)
RDW: 12.9 % (ref 11.5–15.5)
WBC: 14.9 10*3/uL — ABNORMAL HIGH (ref 4.0–10.5)
nRBC: 0 % (ref 0.0–0.2)

## 2020-05-03 LAB — BASIC METABOLIC PANEL
Anion gap: 12 (ref 5–15)
BUN: 17 mg/dL (ref 8–23)
CO2: 22 mmol/L (ref 22–32)
Calcium: 8.1 mg/dL — ABNORMAL LOW (ref 8.9–10.3)
Chloride: 102 mmol/L (ref 98–111)
Creatinine, Ser: 1.09 mg/dL — ABNORMAL HIGH (ref 0.44–1.00)
GFR, Estimated: 46 mL/min — ABNORMAL LOW (ref >60)
Glucose, Bld: 193 mg/dL — ABNORMAL HIGH (ref 70–99)
Potassium: 4.3 mmol/L (ref 3.5–5.1)
Sodium: 136 mmol/L (ref 135–145)

## 2020-05-03 MED ORDER — CHLORHEXIDINE GLUCONATE CLOTH 2 % EX PADS
6.0000 | MEDICATED_PAD | Freq: Every day | CUTANEOUS | Status: DC
  Administered 2020-05-03: 6 via TOPICAL

## 2020-05-03 MED ORDER — HYDROCODONE-ACETAMINOPHEN 5-325 MG PO TABS: 1.0000 | ORAL_TABLET | ORAL | 0 refills | Status: AC | PRN

## 2020-05-03 MED ORDER — ASPIRIN 81 MG PO CHEW: 81.0000 mg | CHEWABLE_TABLET | Freq: Two times a day (BID) | ORAL | 0 refills | Status: DC

## 2020-05-03 NOTE — Progress Notes (Addendum)
Rod Can, MD notified regarding the pt c/o dizziness and nausea during PT. Vitals WNL. Pt refused nausea meds. MD advised to keep the foley cath in until second session of PT is complete. Will reevaluate after PT and notify MD.  Pt walked further with PT during second session. Pt c/o dizziness again. MD made aware. Orders given to remove foley.

## 2020-05-03 NOTE — Plan of Care (Signed)
Plan of care reviewed and discussed with the patient and her son.

## 2020-05-03 NOTE — Care Management Important Message (Signed)
Important Message  Patient Details IM Letter given to the Patient Name: Patricia Garcia MRN: [id number] Date of Birth: February 04, 1933   Medicare Important Message Given:  Yes     Kerin Salen 05/03/2020, 11:00 AM

## 2020-05-03 NOTE — Progress Notes (Signed)
Physical Therapy Treatment Patient Details Name: Patricia Garcia MRN: [id number] DOB: 05-01-1933 Today's Date: 05/03/2020    History of Present Illness Pt s/p fall with R hip fx and now s/p R THR by anterior direct approach.  Pt with hx of ischemic heart disease    PT Comments    Pt continues very cooperative and with marked improvement in activity tolerance and with decreased level of assist required for all tasks.  Pt ambulated increased distance in hall this pm but continues limited by c/o dizziness.   Follow Up Recommendations  Home health PT     Equipment Recommendations  None recommended by PT    Recommendations for Other Services OT consult     Precautions / Restrictions Precautions Precautions: Fall Restrictions Weight Bearing Restrictions: No RLE Weight Bearing: Weight bearing as tolerated    Mobility  Bed Mobility Overal bed mobility: Needs Assistance Bed Mobility: Supine to Sit     Supine to sit: Min assist;Mod assist;+2 for physical assistance;+2 for safety/equipment     General bed mobility comments: Pt up in chair and requests back to same  Transfers Overall transfer level: Needs assistance Equipment used: Rolling walker (2 wheeled) Transfers: Sit to/from Stand Sit to Stand: Min assist;Mod assist         General transfer comment: cues for LE management and use of UEs to self assist.  Physical assist to bring wt up and fwd and to balance in initial standing  Ambulation/Gait Ambulation/Gait assistance: Min assist;+2 safety/equipment (chair follow) Gait Distance (Feet): 28 Feet Assistive device: Rolling walker (2 wheeled) Gait Pattern/deviations: Step-to pattern;Decreased step length - right;Decreased step length - left;Shuffle;Trunk flexed Gait velocity: decr   General Gait Details: cues for sequence, posture and position from RW;  distance ltd by dizziness   Stairs             Wheelchair Mobility    Modified Rankin (Stroke  Patients Only)       Balance Overall balance assessment: Needs assistance Sitting-balance support: No upper extremity supported;Feet supported Sitting balance-Leahy Scale: Good     Standing balance support: Bilateral upper extremity supported Standing balance-Leahy Scale: Poor                              Cognition Arousal/Alertness: Awake/alert Behavior During Therapy: WFL for tasks assessed/performed Overall Cognitive Status: Within Functional Limits for tasks assessed                                        Exercises Total Joint Exercises Ankle Circles/Pumps: AROM;Both;15 reps;Supine Quad Sets: AROM;Both;10 reps;Supine Heel Slides: AAROM;Right;20 reps;Supine Hip ABduction/ADduction: AAROM;Right;15 reps;Supine    General Comments        Pertinent Vitals/Pain Pain Assessment: Faces Faces Pain Scale: Hurts little more Pain Location: R hip Pain Descriptors / Indicators: Aching;Sore Pain Intervention(s): Limited activity within patient's tolerance;Premedicated before session;Monitored during session;Ice applied    Home Living Family/patient expects to be discharged to:: Private residence   Available Help at Discharge: Family;Available 24 hours/day Type of Home: House Home Access: Stairs to enter Entrance Stairs-Rails: Right;Left Home Layout: Able to live on main level with bedroom/bathroom Home Equipment: Walker - 2 wheels;Bedside commode;Cane - single point      Prior Function Level of Independence: Independent          PT Goals (current goals can now  be found in the care plan section) Acute Rehab PT Goals Patient Stated Goal: Regain IND PT Goal Formulation: With patient Time For Goal Achievement: 05/17/20 Potential to Achieve Goals: Good Progress towards PT goals: Progressing toward goals    Frequency    7X/week      PT Plan Current plan remains appropriate    Co-evaluation              AM-PAC PT "6 Clicks"  Mobility   Outcome Measure  Help needed turning from your back to your side while in a flat bed without using bedrails?: A Lot Help needed moving from lying on your back to sitting on the side of a flat bed without using bedrails?: A Lot Help needed moving to and from a bed to a chair (including a wheelchair)?: A Lot Help needed standing up from a chair using your arms (e.g., wheelchair or bedside chair)?: A Lot Help needed to walk in hospital room?: A Little Help needed climbing 3-5 steps with a railing? : A Lot 6 Click Score: 13    End of Session Equipment Utilized During Treatment: Gait belt Activity Tolerance: Patient tolerated treatment well;Patient limited by fatigue Patient left: in chair;with call bell/phone within reach;with chair alarm set;with family/visitor present Nurse Communication: Mobility status PT Visit Diagnosis: Unsteadiness on feet (R26.81);Difficulty in walking, not elsewhere classified (R26.2);Pain Pain - Right/Left: Right Pain - part of body: Hip     Time: 1328-1400 PT Time Calculation (min) (ACUTE ONLY): 32 min  Charges:  $Gait Training: 23-37 mins $Therapeutic Exercise: 8-22 mins                     [place] Pager [phone] Office [phone]    Marisol Giambra 05/03/2020, 3:22 PM

## 2020-05-03 NOTE — TOC Initial Note (Signed)
Transition of Care [facility]) - Initial/Assessment Note    Patient Details  Name: Patricia Garcia MRN: [id number] Date of Birth: 1933/02/19  Transition of [facility]) CM/SW Contact:    Lia Hopping, [place] Phone Number: 05/03/2020, 12:01 PM  Clinical Narrative:    Patient admitted with Femur fracture after a fall at home. Patient underwent surgical intervention. PT recommends HHPT/OT.       CSW met with patient and her son [name] at bedside to discuss a discharge plan home with physical therapy and occupational therapy services. Patient has 24/7 support and care at home. CSW provided a list of medicare bed offers and offered choice. Patient son [name] chose [facility]. CSW confirmed staff availability. CSW notified the agency to contact her son [name].    Expected Discharge Plan: [place] Barriers to Discharge: Continued Medical Work up   Patient Goals and CMS Choice Patient states their goals for this hospitalization and ongoing recovery are:: return home with therapy CMS Medicare.gov Compare Post Acute Care list provided to:: Patient Choice offered to / list presented to : Adult Children, Patient  Expected Discharge Plan and Services Expected Discharge Plan: [place] In-house Referral: Clinical Social Work   Post Acute Care Choice: [place] arrangements for the past 2 months: [place]: PT, OT [place] Agency: [place] Date [place]: 05/03/20 Time [place]: 1159 Representative spoke with at [place]: [place] Arrangements/Services Living arrangements for the past 2 months: [place] with:: Self Patient language and need for interpreter reviewed:: No Do you feel safe going back to the place where you live?: Yes      Need for Family Participation in Patient Care: Yes (Comment) Care giver support system in place?: Yes  (comment) Current home services: DME Criminal Activity/Legal Involvement Pertinent to Current Situation/Hospitalization: No - Comment as needed  Activities of Daily Living Home Assistive Devices/Equipment: Eyeglasses, Hearing aid (2 hearing aides) ADL Screening (condition at time of admission) Patient's cognitive ability adequate to safely complete daily activities?: Yes Is the patient deaf or have difficulty hearing?: Yes (wears 2 hearing aides) Does the patient have difficulty seeing, even when wearing glasses/contacts?: No Does the patient have difficulty concentrating, remembering, or making decisions?: No Patient able to express need for assistance with ADLs?: Yes Does the patient have difficulty dressing or bathing?: Yes Independently performs ADLs?: No Communication: Independent Dressing (OT): Needs assistance Is this a change from baseline?: Change from baseline, expected to last >3 days Grooming: Independent Feeding: Independent Bathing: Needs assistance Is this a change from baseline?: Change from baseline, expected to last >3 days Toileting: Needs assistance Is this a change from baseline?: Change from baseline, expected to last >3days In/Out Bed: Needs assistance Is this a change from baseline?: Change from baseline, expected to last >3 days Walks in Home: Dependent Is this a change from baseline?: Change from baseline, expected to last >3 days Does the patient have difficulty walking or climbing stairs?: Yes Weakness of Legs: Right Weakness of Arms/Hands: None  Permission Sought/Granted Permission sought to share information with : Case Manager, Family Supports Permission granted to share information with : Yes, Verbal Permission Granted     Permission granted to share info w AGENCY: [place] agency  Alvis Lemmings  Permission granted to share info w Relationship: Son     Emotional Assessment Appearance:: Appears stated age Attitude/Demeanor/Rapport: Engaged,  Gracious Affect (typically observed): Accepting, Pleasant Orientation: : Oriented to Self, Oriented to Place, Oriented to  Time, Oriented to Situation Alcohol / Substance Use: Not Applicable Psych Involvement: No (comment)  Admission diagnosis:  Femur fracture, right ([place]) [S72.91XA] Closed subcapital fracture of right femur, initial encounter ([place]) [S72.011A] Fall, initial encounter [W19.XXXA] Patient Active Problem List   Diagnosis Date Noted  . Femur fracture, right ([place]) 04/30/2020  . Chronic abdominal pain 09/23/2011  . Gas pain 09/23/2011  . GERD (gastroesophageal reflux disease) 09/23/2011  . Constipation, slow transit 09/23/2011  . Anal or rectal pain 09/23/2011  . GASTRITIS 06/26/2010  . CONSTIPATION 06/21/2010  . RECTAL BLEEDING 06/21/2010  . VITAMIN D DEFICIENCY 06/20/2010  . Anxiety state 06/20/2010  . GLAUCOMA 06/20/2010  . CATARACTS 06/20/2010  . HYPERTENSION, BENIGN 06/20/2010  . SINUSITIS, RECURRENT 06/20/2010  . GERD 06/20/2010  . Disorder of bone and cartilage 06/20/2010  . ABDOMINAL PAIN 06/20/2010  . MICROALBUMINURIA 06/20/2010   PCP:  Lajean Manes, MD Pharmacy:   [id number] [place] [place]Phone: [phone]: [phone]    Social Determinants of Health (SDOH) Interventions    Readmission Risk Interventions No flowsheet data found.

## 2020-05-03 NOTE — Progress Notes (Signed)
[facility]  PROGRESS NOTE  Patricia URIOSTEGUI MEQ:[id number] DOB: 1933-06-25 DOA: 04/30/2020 PCP: Lajean Manes, MD   Brief HPI:   86 year old female with history of hypertension, anxiety disorder, ischemic cardiomyopathy, osteopenia, diverticulosis who fell off 2 to 3 foot stepladder at home and sustained mechanical fall.  Patient fell to the right and sustained a right femoral neck fracture.  Patient did not pass out, did not hit her head.  Orthopedics was consulted.    Subjective   Patient seen and examined, s/p total right hip arthroplasty.  Denies pain.  Working with physical therapy.   Assessment/Plan:     1. Right femur fracture-secondary to fall at home, patient underwent total hip arthroplasty.  Orthopedics has cleared patient for discharge.  Patient working with physical therapy.  Will likely go home with home PT.   2. Osteopenia-calcium, vitamin D postop. 3. Ischemic cardiomyopathy-patient has remote history of ischemic cardiopathy, no cardiac history or MI.  No new stent placement. 4. Anxiety-continue Xanax, Celexa 5. Hypertension- Blood pressure is mildly elevated. Continue clonidine 0.1 mg twice daily.     COVID-19 Labs  No results for input(s): DDIMER, FERRITIN, LDH, CRP in the last 72 hours.  Lab Results  Component Value Date   [place] NEGATIVE 04/30/2020     Scheduled medications:   . (feeding supplement) PROSource Plus  30 mL Oral Daily  . Chlorhexidine Gluconate Cloth  6 each Topical Daily  . cholecalciferol  1,000 Units Oral Daily  . citalopram  20 mg Oral Daily  . cloNIDine  0.1 mg Oral BID  . docusate sodium  100 mg Oral BID  . enoxaparin (LOVENOX) injection  40 mg Subcutaneous Q24H  . famotidine  40 mg Oral Daily  . feeding supplement  237 mL Oral Q24H  . ferrous sulfate  325 mg Oral Q M,W,F  . ketorolac  7.5 mg Intravenous Q6H  . latanoprost  1 drop Both Eyes QHS  . multivitamin-iron-minerals-folic acid  1 tablet Oral Daily  .  mupirocin ointment  1 application Nasal BID  . omega-3 acid ethyl esters  1 g Oral Daily  . senna  1 tablet Oral BID         CBG: No results for input(s): GLUCAP in the last 168 hours.  SpO2: 94 % O2 Flow Rate (L/min): 2 L/min    CBC: Recent Labs  Lab 04/30/20 2021 05/01/20 0328 05/03/20 0348  WBC 14.5* 9.6 14.9*  NEUTROABS 12.1*  --   --   HGB 11.9* 11.3* 9.9*  HCT 37.1 34.9* 30.8*  MCV 90.0 89.9 89.5  PLT 333 308 297    Basic Metabolic Panel: Recent Labs  Lab 04/30/20 2021 05/01/20 0328 05/03/20 0348  NA 141 139 136  K 4.0 3.9 4.3  CL 106 107 102  CO2 26 24 22   GLUCOSE 133* 130* 193*  BUN 13 13 17   CREATININE 0.94 0.83 1.09*  CALCIUM 9.1 8.6* 8.1*     Liver Function Tests: Recent Labs  Lab 05/01/20 0328  AST 28  ALT 21  ALKPHOS 50  BILITOT 0.9  PROT 6.8  ALBUMIN 3.5     Antibiotics: Anti-infectives (From admission, onward)   Start     Dose/Rate Route Frequency Ordered Stop   05/02/20 2200  ceFAZolin (ANCEF) IVPB 2g/100 mL premix        2 g 200 mL/hr over 30 Minutes Intravenous Every 6 hours 05/02/20 2152 05/03/20 0356   05/02/20 1830  ceFAZolin (ANCEF) IVPB 2g/100 mL premix  Status:  Discontinued        2 g 200 mL/hr over 30 Minutes Intravenous Every 6 hours 05/02/20 1818 05/02/20 2152   05/02/20 1500  ceFAZolin (ANCEF) IVPB 2g/100 mL premix        2 g 200 mL/hr over 30 Minutes Intravenous On call to O.R. 05/01/20 1919 05/02/20 1550       DVT prophylaxis: SCDs  Code Status: Full code  Family Communication: No family at bedside    Status is: Inpatient  Dispo: The patient is from: Home              Anticipated d/c is to: Skilled nursing facility              Anticipated d/c date is: 05/04/2020              Patient currently not medically stable for discharge  Barrier to discharge-plan for surgery today      Consultants:  Orthopedics  Procedures:     Objective   Vitals:   05/02/20 2243 05/03/20 0304 05/03/20  0628 05/03/20 0945  BP: (!) 162/73 (!) 106/50 (!) 156/61 (!) 170/63  Pulse: 76 (!) 58 73 88  Resp: 14 15 16 20   Temp: 97.9 F (36.6 C) (!) 97.4 F (36.3 C) 97.7 F (36.5 C) 97.7 F (36.5 C)  TempSrc: Oral Oral Oral Oral  SpO2: 94% 97% 97% 94%  Weight:      Height:        Intake/Output Summary (Last 24 hours) at 05/03/2020 1341 Last data filed at 05/03/2020 1303 Gross per 24 hour  Intake 3522.37 ml  Output 1625 ml  Net 1897.37 ml    10/12 1901 - 10/14 0700 In: 3932.1 [P.O.:775; I.V.:2957.1] Out: 2825 [Urine:2625]  Filed Weights   04/30/20 1744 05/01/20 0041  Weight: 56.7 kg 57.3 kg    Physical Examination:  General-appears in no acute distress Heart-S1-S2, regular, no murmur auscultated Lungs-clear to auscultation bilaterally, no wheezing or crackles auscultated Abdomen-soft, nontender, no organomegaly Extremities-no edema in the lower extremities Neuro-alert, oriented x3, no focal deficit noted   Data Reviewed:   Recent Results (from the past 240 hour(s))  Respiratory Panel by RT PCR (Flu A&B, Covid) - Nasopharyngeal Swab     Status: None   Collection Time: 04/30/20  8:21 PM   Specimen: Nasopharyngeal Swab  Result Value Ref Range Status   SARS Coronavirus 2 by RT PCR NEGATIVE NEGATIVE Final    Comment: (NOTE) SARS-CoV-2 target nucleic acids are NOT DETECTED.  The SARS-CoV-2 RNA is generally detectable in upper respiratoy specimens during the acute phase of infection. The lowest concentration of SARS-CoV-2 viral copies this assay can detect is 131 copies/mL. A negative result does not preclude SARS-Cov-2 infection and should not be used as the sole basis for treatment or other patient management decisions. A negative result may occur with  improper specimen collection/handling, submission of specimen other than nasopharyngeal swab, presence of viral mutation(s) within the areas targeted by this assay, and inadequate number of viral copies (<131 copies/mL).  A negative result must be combined with clinical observations, patient history, and epidemiological information. The expected result is Negative.  Fact Sheet for Patients:  PinkCheek.be  Fact Sheet for Healthcare Providers:  GravelBags.it  This test is no t yet approved or cleared by the [place] FDA and  has been authorized for detection and/or diagnosis of SARS-CoV-2 by FDA under an Emergency Use Authorization (EUA). This EUA will remain  in effect (meaning this  test can be used) for the duration of the COVID-19 declaration under Section 564(b)(1) of the Act, 21 U.S.C. section 360bbb-3(b)(1), unless the authorization is terminated or revoked sooner.     Influenza A by PCR NEGATIVE NEGATIVE Final   Influenza B by PCR NEGATIVE NEGATIVE Final    Comment: (NOTE) The Xpert Xpress SARS-CoV-2/FLU/RSV assay is intended as an aid in  the diagnosis of influenza from Nasopharyngeal swab specimens and  should not be used as a sole basis for treatment. Nasal washings and  aspirates are unacceptable for Xpert Xpress SARS-CoV-2/FLU/RSV  testing.  Fact Sheet for Patients: PinkCheek.be  Fact Sheet for Healthcare Providers: GravelBags.it  This test is not yet approved or cleared by the [place] FDA and  has been authorized for detection and/or diagnosis of SARS-CoV-2 by  FDA under an Emergency Use Authorization (EUA). This EUA will remain  in effect (meaning this test can be used) for the duration of the  Covid-19 declaration under Section 564(b)(1) of the Act, 21  U.S.C. section 360bbb-3(b)(1), unless the authorization is  terminated or revoked. Performed at [facility], [place] [address], [place], [place] [address]   Surgical PCR screen     Status: Abnormal   Collection Time: 05/01/20  1:15 AM   Specimen: Nasal Mucosa; Nasal Swab  Result Value  Ref Range Status   MRSA, PCR NEGATIVE NEGATIVE Final   Staphylococcus aureus POSITIVE (A) NEGATIVE Final    Comment: (NOTE) The Xpert SA Assay (FDA approved for NASAL specimens in patients 44 years of age and older), is one component of a comprehensive surveillance program. It is not intended to diagnose infection nor to guide or monitor treatment. Performed at [facility], [place] [address], [place], [place] [address]     No results for input(s): LIPASE, AMYLASE in the last 168 hours. No results for input(s): AMMONIA in the last 168 hours.  Cardiac Enzymes: No results for input(s): CKTOTAL, CKMB, CKMBINDEX, TROPONINI in the last 168 hours. BNP (last 3 results) No results for input(s): BNP in the last 8760 hours.  ProBNP (last 3 results) No results for input(s): PROBNP in the last 8760 hours.  Studies:  Pelvis Portable  Result Date: 05/02/2020 CLINICAL DATA:  Status post right hip replacement EXAM: PORTABLE PELVIS 1-2 VIEWS COMPARISON:  Intraoperative films from earlier in the same day. FINDINGS: Right hip prosthesis is noted in satisfactory position. No acute bony or soft tissue abnormality is noted. IMPRESSION: Status post right hip replacement Electronically Signed   By: Inez Catalina M.D.   On: 05/02/2020 19:03   DG C-Arm 1-60 Min-No Report  Result Date: 05/02/2020 Fluoroscopy was utilized by the requesting physician.  No radiographic interpretation.   DG HIP OPERATIVE UNILAT W OR W/O PELVIS RIGHT  Result Date: 05/02/2020 CLINICAL DATA:  Proximal right femoral fracture EXAM: OPERATIVE RIGHT HIP WITH PELVIS COMPARISON:  04/30/2020 FLUOROSCOPY TIME:  Radiation Exposure Index (as provided by the fluoroscopic device): 0.97 mGy If the device does not provide the exposure index: Fluoroscopy Time:  9 seconds Number of Acquired Images:  4 FINDINGS: Initial images again demonstrate the subcapital femoral neck fracture with impaction at the fracture site. Subsequent  right hip prosthesis is noted in satisfactory position. No acute soft tissue abnormality is seen. IMPRESSION: Status post right hip replacement Electronically Signed   By: Inez Catalina M.D.   On: 05/02/2020 19:04       [place]   [facility] If 7PM-7AM, please contact  night-coverage at [email or web address], Office  [phone]   05/03/2020, 1:41 PM  LOS: 3 days

## 2020-05-03 NOTE — Progress Notes (Signed)
    Subjective:  Patient reports pain as mild to moderate.  Denies N/V/CP/SOB. No c/o  Objective:   VITALS:   Vitals:   05/02/20 2243 05/03/20 0304 05/03/20 0628 05/03/20 0945  BP: (!) 162/73 (!) 106/50 (!) 156/61 (!) 170/63  Pulse: 76 (!) 58 73 88  Resp: 14 15 16 20   Temp: 97.9 F (36.6 C) (!) 97.4 F (36.3 C) 97.7 F (36.5 C) 97.7 F (36.5 C)  TempSrc: Oral Oral Oral Oral  SpO2: 94% 97% 97% 94%  Weight:      Height:        NAD ABD soft Sensation intact distally Intact pulses distally Dorsiflexion/Plantar flexion intact Incision: dressing C/D/I Compartment soft   Lab Results  Component Value Date   WBC 14.9 (H) 05/03/2020   HGB 9.9 (L) 05/03/2020   HCT 30.8 (L) 05/03/2020   MCV 89.5 05/03/2020   PLT 217 05/03/2020   BMET    Component Value Date/Time   NA 136 05/03/2020 0348   K 4.3 05/03/2020 0348   CL 102 05/03/2020 0348   CO2 22 05/03/2020 0348   GLUCOSE 193 (H) 05/03/2020 0348   BUN 17 05/03/2020 0348   CREATININE 1.09 (H) 05/03/2020 0348   CALCIUM 8.1 (L) 05/03/2020 0348   GFRNONAA 46 (L) 05/03/2020 0348   GFRAA 76 (L) 06/06/2011 1400     Assessment/Plan: 1 Day Post-Op   Principal Problem:   Femur fracture, right (HCC) Active Problems:   Anxiety state   HYPERTENSION, BENIGN   GERD   Disorder of bone and cartilage   WBAT with walker DVT ppx: Lovenox in house --> d/c on ASA 81 mg PO BID, SCDs, TEDS PO pain control PT/OT Dispo: d/c home when ok with hospitalist   Hilton Cork Geraldy Akridge 05/03/2020, 11:35 AM   Rod Can, MD [phone] [place] [facility] now [facility]  [facility] [address], [place], [place] [address] Phone: [phone] [email or web address] Facebook  [employer]

## 2020-05-03 NOTE — Evaluation (Signed)
Physical Therapy Evaluation Patient Details Name: Patricia Garcia MRN: [id number] DOB: 23-Dec-1932 Today's Date: 05/03/2020   History of Present Illness  Pt s/p fall with R hip fx and now s/p R THR by anterior direct approach.  Pt with hx of ischemic heart disease  Clinical Impression  Pt admitted as above and presenting with decreased R LE strength/ROM and post op pain limiting functional mobility.  Pt should progress to dc home with 24/7 assist of family and would benefit from follow up HHPT to maximize IND and safety.    Follow Up Recommendations Home health PT    Equipment Recommendations  None recommended by PT    Recommendations for Other Services OT consult     Precautions / Restrictions Precautions Precautions: Fall Restrictions Weight Bearing Restrictions: No RLE Weight Bearing: Weight bearing as tolerated      Mobility  Bed Mobility Overal bed mobility: Needs Assistance Bed Mobility: Supine to Sit     Supine to sit: Min assist;Mod assist;+2 for physical assistance;+2 for safety/equipment     General bed mobility comments: Increased time with cues for sequence and use of L LE to self assistq  Transfers Overall transfer level: Needs assistance Equipment used: Rolling walker (2 wheeled) Transfers: Sit to/from Stand Sit to Stand: Min assist;Mod assist;+2 physical assistance;+2 safety/equipment;From elevated surface         General transfer comment: cues for LE management and use of UEs to self assist.  Physical assist to bring wt up and fwd and to balance in initial standing  Ambulation/Gait Ambulation/Gait assistance: Min assist;+2 physical assistance;+2 safety/equipment Gait Distance (Feet): 5 Feet Assistive device: Rolling walker (2 wheeled) Gait Pattern/deviations: Step-to pattern;Decreased step length - right;Decreased step length - left;Shuffle;Trunk flexed Gait velocity: decr   General Gait Details: cues for sequence, posture and position from RW;   distance ltd by nausea  Stairs            Wheelchair Mobility    Modified Rankin (Stroke Patients Only)       Balance Overall balance assessment: Needs assistance Sitting-balance support: No upper extremity supported;Feet supported Sitting balance-Leahy Scale: Fair     Standing balance support: Bilateral upper extremity supported Standing balance-Leahy Scale: Poor                               Pertinent Vitals/Pain Pain Assessment: Faces Faces Pain Scale: Hurts little more Pain Location: R hip Pain Descriptors / Indicators: Aching;Sore Pain Intervention(s): Limited activity within patient's tolerance;Monitored during session;Premedicated before session;Ice applied    Home Living Family/patient expects to be discharged to:: Private residence   Available Help at Discharge: Family;Available 24 hours/day Type of Home: House Home Access: Stairs to enter Entrance Stairs-Rails: Psychiatric nurse of Steps: 3 Home Layout: Able to live on main level with bedroom/bathroom Home Equipment: Walker - 2 wheels;Bedside commode;Cane - single point      Prior Function Level of Independence: Independent               Hand Dominance        Extremity/Trunk Assessment   Upper Extremity Assessment Upper Extremity Assessment: Overall WFL for tasks assessed    Lower Extremity Assessment Lower Extremity Assessment: RLE deficits/detail RLE Deficits / Details: 2/5 strength at hip with AAROM at hip to 85 flex and 15 abd       Communication   Communication: HOH  Cognition Arousal/Alertness: Awake/alert Behavior During Therapy: WFL for tasks  assessed/performed Overall Cognitive Status: Within Functional Limits for tasks assessed                                        General Comments      Exercises Total Joint Exercises Ankle Circles/Pumps: AROM;Both;15 reps;Supine Quad Sets: AROM;Both;10 reps;Supine Heel Slides:  AAROM;Right;20 reps;Supine Hip ABduction/ADduction: AAROM;Right;15 reps;Supine   Assessment/Plan    PT Assessment Patient needs continued PT services  PT Problem List Decreased strength;Decreased range of motion;Decreased activity tolerance;Decreased balance;Decreased mobility;Decreased knowledge of use of DME       PT Treatment Interventions DME instruction;Gait training;Stair training;Functional mobility training;Therapeutic activities;Therapeutic exercise;Balance training;Patient/family education    PT Goals (Current goals can be found in the Care Plan section)  Acute Rehab PT Goals Patient Stated Goal: Regain IND PT Goal Formulation: With patient Time For Goal Achievement: 05/17/20 Potential to Achieve Goals: Good    Frequency 7X/week   Barriers to discharge        Co-evaluation               AM-PAC PT "6 Clicks" Mobility  Outcome Measure Help needed turning from your back to your side while in a flat bed without using bedrails?: A Lot Help needed moving from lying on your back to sitting on the side of a flat bed without using bedrails?: A Lot Help needed moving to and from a bed to a chair (including a wheelchair)?: A Lot Help needed standing up from a chair using your arms (e.g., wheelchair or bedside chair)?: A Lot Help needed to walk in hospital room?: A Lot Help needed climbing 3-5 steps with a railing? : A Lot 6 Click Score: 12    End of Session Equipment Utilized During Treatment: Gait belt Activity Tolerance: Patient tolerated treatment well;Patient limited by fatigue Patient left: in chair;with call bell/phone within reach;with chair alarm set;with family/visitor present Nurse Communication: Mobility status PT Visit Diagnosis: Unsteadiness on feet (R26.81);Difficulty in walking, not elsewhere classified (R26.2);Pain Pain - Right/Left: Right Pain - part of body: Hip    Time: 4680-3212 PT Time Calculation (min) (ACUTE ONLY): 41 min   Charges:   PT  Evaluation $PT Eval Low Complexity: 1 Low PT Treatments $Gait Training: 8-22 mins $Therapeutic Exercise: 8-22 mins        Debe Coder PT Acute Rehabilitation Services Pager [phone] Office [phone]   Lannis Lichtenwalner 05/03/2020, 12:16 PM

## 2020-05-04 DIAGNOSIS — S72041A Displaced fracture of base of neck of right femur, initial encounter for closed fracture: Secondary | ICD-10-CM | POA: Diagnosis not present

## 2020-05-04 DIAGNOSIS — I10 Essential (primary) hypertension: Secondary | ICD-10-CM | POA: Diagnosis not present

## 2020-05-04 DIAGNOSIS — S72001A Fracture of unspecified part of neck of right femur, initial encounter for closed fracture: Secondary | ICD-10-CM | POA: Diagnosis not present

## 2020-05-04 LAB — CBC
HCT: 25.3 % — ABNORMAL LOW (ref 36.0–46.0)
Hemoglobin: 8.1 g/dL — ABNORMAL LOW (ref 12.0–15.0)
MCH: 28.5 pg (ref 26.0–34.0)
MCHC: 32 g/dL (ref 30.0–36.0)
MCV: 89.1 fL (ref 80.0–100.0)
Platelets: 255 10*3/uL (ref 150–400)
RBC: 2.84 MIL/uL — ABNORMAL LOW (ref 3.87–5.11)
RDW: 12.9 % (ref 11.5–15.5)
WBC: 13.1 10*3/uL — ABNORMAL HIGH (ref 4.0–10.5)
nRBC: 0 % (ref 0.0–0.2)

## 2020-05-04 LAB — BASIC METABOLIC PANEL
Anion gap: 9 (ref 5–15)
BUN: 25 mg/dL — ABNORMAL HIGH (ref 8–23)
CO2: 26 mmol/L (ref 22–32)
Calcium: 8.3 mg/dL — ABNORMAL LOW (ref 8.9–10.3)
Chloride: 102 mmol/L (ref 98–111)
Creatinine, Ser: 1.18 mg/dL — ABNORMAL HIGH (ref 0.44–1.00)
GFR, Estimated: 42 mL/min — ABNORMAL LOW (ref >60)
Glucose, Bld: 116 mg/dL — ABNORMAL HIGH (ref 70–99)
Potassium: 3.9 mmol/L (ref 3.5–5.1)
Sodium: 137 mmol/L (ref 135–145)

## 2020-05-04 MED ORDER — DOCUSATE SODIUM 100 MG PO CAPS
100.0000 mg | ORAL_CAPSULE | Freq: Two times a day (BID) | ORAL | 0 refills | Status: DC
Start: 2020-05-04 — End: 2020-09-10

## 2020-05-04 MED ORDER — SENNA 8.6 MG PO TABS: 1.0000 | ORAL_TABLET | Freq: Every day | ORAL | 0 refills | Status: DC | PRN

## 2020-05-04 NOTE — Progress Notes (Signed)
Pt and pt's son provided with d/c instructions. After discussing the pt's plan of care upon d/c home, the pt and son denied any further questions or concerns.

## 2020-05-04 NOTE — Plan of Care (Signed)
  Problem: Education: Goal: Knowledge of General Education information will improve Description: Including pain rating scale, medication(s)/side effects and non-pharmacologic comfort measures Outcome: Progressing   Problem: Health Behavior/Discharge Planning: Goal: Ability to manage health-related needs will improve Outcome: Progressing   Problem: Clinical Measurements: Goal: Ability to maintain clinical measurements within normal limits will improve Outcome: Progressing Goal: Will remain free from infection Outcome: Progressing Goal: Diagnostic test results will improve Outcome: Progressing Goal: Respiratory complications will improve Outcome: Progressing Goal: Cardiovascular complication will be avoided Outcome: Progressing   Problem: Activity: Goal: Risk for activity intolerance will decrease Outcome: Progressing   Problem: Nutrition: Goal: Adequate nutrition will be maintained Outcome: Progressing   Problem: Coping: Goal: Level of anxiety will decrease Outcome: Progressing   Problem: Elimination: Goal: Will not experience complications related to bowel motility Outcome: Progressing Goal: Will not experience complications related to urinary retention Outcome: Progressing   Problem: Pain Managment: Goal: General experience of comfort will improve Outcome: Progressing   Problem: Safety: Goal: Ability to remain free from injury will improve Outcome: Progressing   Problem: Skin Integrity: Goal: Risk for impaired skin integrity will decrease Outcome: Progressing   Problem: Education: Goal: Knowledge of the prescribed therapeutic regimen will improve Outcome: Progressing Goal: Understanding of discharge needs will improve Outcome: Progressing   Problem: Activity: Goal: Ability to avoid complications of mobility impairment will improve Outcome: Progressing Goal: Ability to tolerate increased activity will improve Outcome: Progressing   Problem: Clinical  Measurements: Goal: Postoperative complications will be avoided or minimized Outcome: Progressing   Problem: Pain Management: Goal: Pain level will decrease with appropriate interventions Outcome: Progressing   Problem: Skin Integrity: Goal: Will show signs of wound healing Outcome: Progressing   Problem: Activity: Goal: Ability to ambulate and perform ADLs will improve Outcome: Progressing   Problem: Clinical Measurements: Goal: Postoperative complications will be avoided or minimized Outcome: Progressing   Problem: Self-Concept: Goal: Ability to maintain and perform role responsibilities to the fullest extent possible will improve Outcome: Progressing   Problem: Pain Management: Goal: Pain level will decrease Outcome: Progressing

## 2020-05-04 NOTE — Progress Notes (Signed)
Physical Therapy Treatment Patient Details Name: Patricia Garcia MRN: [id number] DOB: 1933-06-14 Today's Date: 05/04/2020    History of Present Illness Pt s/p fall with R hip fx and now s/p R THR by anterior direct approach.  Pt with hx of ischemic heart disease    PT Comments    Pt with noted improvement in activity tolerance and stability and with decreased assist for all tasks.  Pt negotiated stairs with assist of son but continues anxious regarding dc home.  Will review in pm.   Follow Up Recommendations  Home health PT     Equipment Recommendations  None recommended by PT    Recommendations for Other Services OT consult     Precautions / Restrictions Precautions Precautions: Fall Restrictions Weight Bearing Restrictions: No RLE Weight Bearing: Weight bearing as tolerated    Mobility  Bed Mobility               General bed mobility comments: Pt up in chair and requests back to same  Transfers Overall transfer level: Needs assistance Equipment used: Rolling walker (2 wheeled) Transfers: Sit to/from Stand Sit to Stand: Min guard         General transfer comment: cues for LE management and use of UEs to self assist. Steady assist only  Ambulation/Gait Ambulation/Gait assistance: Min assist Gait Distance (Feet): 100 Feet Assistive device: Rolling walker (2 wheeled) Gait Pattern/deviations: Step-to pattern;Decreased step length - right;Decreased step length - left;Shuffle;Trunk flexed Gait velocity: decr   General Gait Details: cues for sequence, posture and position from RW;    Stairs Stairs: Yes Stairs assistance: Min assist Stair Management: Two rails;Step to pattern;Forwards Number of Stairs: 5 General stair comments: 2+3 steps; cues for sequence; son assisting on second attempt   Wheelchair Mobility    Modified Rankin (Stroke Patients Only)       Balance Overall balance assessment: Needs assistance Sitting-balance support: No upper  extremity supported;Feet supported Sitting balance-Leahy Scale: Good     Standing balance support: Bilateral upper extremity supported Standing balance-Leahy Scale: Poor                              Cognition Arousal/Alertness: Awake/alert Behavior During Therapy: WFL for tasks assessed/performed Overall Cognitive Status: Within Functional Limits for tasks assessed                                        Exercises      General Comments        Pertinent Vitals/Pain Pain Assessment: 0-10 Pain Score: 3  Pain Location: R hip Pain Descriptors / Indicators: Aching;Sore Pain Intervention(s): Limited activity within patient's tolerance;Monitored during session;Premedicated before session    Home Living                      Prior Function            PT Goals (current goals can now be found in the care plan section) Acute Rehab PT Goals Patient Stated Goal: Regain IND PT Goal Formulation: With patient Time For Goal Achievement: 05/17/20 Potential to Achieve Goals: Good Progress towards PT goals: Progressing toward goals    Frequency    7X/week      PT Plan Current plan remains appropriate    Co-evaluation  AM-PAC PT "6 Clicks" Mobility   Outcome Measure  Help needed turning from your back to your side while in a flat bed without using bedrails?: A Lot Help needed moving from lying on your back to sitting on the side of a flat bed without using bedrails?: A Lot Help needed moving to and from a bed to a chair (including a wheelchair)?: A Little Help needed standing up from a chair using your arms (e.g., wheelchair or bedside chair)?: A Little Help needed to walk in hospital room?: A Little Help needed climbing 3-5 steps with a railing? : A Little 6 Click Score: 16    End of Session Equipment Utilized During Treatment: Gait belt Activity Tolerance: Patient tolerated treatment well;Patient limited by  fatigue Patient left: in chair;with call bell/phone within reach;with chair alarm set;with family/visitor present Nurse Communication: Mobility status PT Visit Diagnosis: Unsteadiness on feet (R26.81);Difficulty in walking, not elsewhere classified (R26.2);Pain Pain - Right/Left: Right Pain - part of body: Hip     Time: 2903-7955 PT Time Calculation (min) (ACUTE ONLY): 27 min  Charges:  $Gait Training: 23-37 mins                     [place] Pager [phone] Office [phone]    Taishaun Levels 05/04/2020, 12:57 PM

## 2020-05-04 NOTE — Progress Notes (Signed)
Physical Therapy Treatment Patient Details Name: Patricia Garcia MRN: [id number] DOB: Mar 27, 1933 Today's Date: 05/04/2020    History of Present Illness Pt s/p fall with R hip fx and now s/p R THR by anterior direct approach.  Pt with hx of ischemic heart disease    PT Comments    Pt continues to progress steadily with mobility - noted increased activity tolerance, increased stability and decreased assist required.  Pt feeling more comfortable with return home this date.   Follow Up Recommendations  Home health PT     Equipment Recommendations  None recommended by PT    Recommendations for Other Services OT consult     Precautions / Restrictions Precautions Precautions: Fall Restrictions Weight Bearing Restrictions: No RLE Weight Bearing: Weight bearing as tolerated    Mobility  Bed Mobility               General bed mobility comments: Pt up in chair and requests back to same  Transfers Overall transfer level: Needs assistance Equipment used: Rolling walker (2 wheeled) Transfers: Sit to/from Stand Sit to Stand: Supervision         General transfer comment: cues for LE management and use of UEs to self assist.  Ambulation/Gait Ambulation/Gait assistance: Min guard;Supervision Gait Distance (Feet): 222 Feet Assistive device: Rolling walker (2 wheeled) Gait Pattern/deviations: Step-to pattern;Step-through pattern;Decreased step length - right;Decreased step length - left;Shuffle;Trunk flexed Gait velocity: decr   General Gait Details: cues for sequence, posture and position from RW;    Chief Strategy Officer    Modified Rankin (Stroke Patients Only)       Balance Overall balance assessment: Needs assistance Sitting-balance support: No upper extremity supported;Feet supported Sitting balance-Leahy Scale: Good     Standing balance support: No upper extremity supported Standing balance-Leahy Scale: Fair                               Cognition Arousal/Alertness: Awake/alert Behavior During Therapy: WFL for tasks assessed/performed Overall Cognitive Status: Within Functional Limits for tasks assessed                                        Exercises      General Comments        Pertinent Vitals/Pain Pain Assessment: 0-10 Pain Score: 3  Pain Location: R hip Pain Descriptors / Indicators: Aching;Sore Pain Intervention(s): Limited activity within patient's tolerance;Monitored during session;Premedicated before session;Ice applied    Home Living                      Prior Function            PT Goals (current goals can now be found in the care plan section) Acute Rehab PT Goals Patient Stated Goal: Regain IND PT Goal Formulation: With patient Time For Goal Achievement: 05/17/20 Potential to Achieve Goals: Good Progress towards PT goals: Progressing toward goals    Frequency    7X/week      PT Plan Current plan remains appropriate    Co-evaluation              AM-PAC PT "6 Clicks" Mobility   Outcome Measure  Help needed turning from your back to your side while in a flat bed without using  bedrails?: A Lot Help needed moving from lying on your back to sitting on the side of a flat bed without using bedrails?: A Lot Help needed moving to and from a bed to a chair (including a wheelchair)?: A Little Help needed standing up from a chair using your arms (e.g., wheelchair or bedside chair)?: A Little Help needed to walk in hospital room?: A Little Help needed climbing 3-5 steps with a railing? : A Little 6 Click Score: 16    End of Session Equipment Utilized During Treatment: Gait belt Activity Tolerance: Patient tolerated treatment well Patient left: in chair;with call bell/phone within reach;with chair alarm set Nurse Communication: Mobility status PT Visit Diagnosis: Unsteadiness on feet (R26.81);Difficulty in walking, not elsewhere  classified (R26.2);Pain Pain - Right/Left: Right Pain - part of body: Hip     Time: 1404-1430 PT Time Calculation (min) (ACUTE ONLY): 26 min  Charges:  $Gait Training: 23-37 mins                     [place] Pager [phone] Office [phone]    Gayl Ivanoff 05/04/2020, 4:54 PM

## 2020-05-04 NOTE — Discharge Summary (Signed)
Physician Discharge Summary  CANNIE MUCKLE HGD:[id number] DOB: 02-Jun-1933 DOA: 04/30/2020  PCP: Lajean Manes, MD  Admit date: 04/30/2020 Discharge date: 05/04/2020  Time spent: 50* minutes  Recommendations for Outpatient Follow-up:  1. Follow-up orthopedic surgery in 2 weeks   Discharge Diagnoses:  Principal Problem:   Femur fracture, right ([place]) Active Problems:   Anxiety state   HYPERTENSION, BENIGN   GERD   Disorder of bone and cartilage   Discharge Condition: Stable  Diet recommendation: Heart healthy diet  Filed Weights   04/30/20 1744 05/01/20 0041  Weight: 56.7 kg 57.3 kg    History of present illness:  86 year old female with history of hypertension, anxiety disorder, ischemic cardiomyopathy, osteopenia, diverticulosis who fell off 2 to 3 foot stepladder at home and sustained mechanical fall.  Patient fell to the right and sustained a right femoral neck fracture.  Patient did not pass out, did not hit her head.  Orthopedics was consulted  Hospital Course:   1. Right femur fracture-secondary to fall at home, patient underwent total hip arthroplasty.  Orthopedics has cleared patient for discharge.  Patient working with physical therapy.    Patient will go home with physical therapy.   2. Osteopenia-calcium, vitamin D postop. 3. Ischemic cardiomyopathy-patient has remote history of ischemic cardiopathy, no cardiac history or MI.  No new stent placement. 4. Anxiety-continue Xanax, Celexa 5. Hypertension- Blood pressure is mildly elevated. Continue clonidine 0.1 mg twice daily.    Procedures: ORIF  Consultations:  Orthopedics  Discharge Exam: Vitals:   05/04/20 0616 05/04/20 0617  BP: 109/66   Pulse: 68   Resp: 16   Temp: 97.8 F (36.6 C)   SpO2: 92% 94%    General: Appear in no acute distress Cardiovascular: S1S2 RRR Respiratory: Clear bilaterally  Discharge Instructions   Discharge Instructions    Diet - low sodium heart healthy    Complete by: As directed    Increase activity slowly   Complete by: As directed    No wound care   Complete by: As directed      Allergies as of 05/04/2020      Reactions   Penicillins Other (See Comments)   unknown   Prednisone Other (See Comments)   unknown   Statins Other (See Comments)   shaking   Sulfonamide Derivatives Other (See Comments)   unknown   Tramadol Other (See Comments)   unknown      Medication List    STOP taking these medications   Zithromax Z-Pak 250 MG tablet Generic drug: azithromycin     TAKE these medications   ALPRAZolam 0.25 MG tablet Commonly known as: XANAX Take 0.025 mg by mouth at bedtime as needed for anxiety.   aspirin 81 MG chewable tablet Commonly known as: Aspirin Childrens Chew 1 tablet (81 mg total) by mouth 2 (two) times daily with a meal.   CALCIUM 1200 PO Take 1,200 mg by mouth daily.   cholecalciferol 1000 units tablet Commonly known as: VITAMIN D Take 1,000 Units by mouth daily.   citalopram 20 MG tablet Commonly known as: CELEXA Take 20 mg by mouth daily.   cloNIDine 0.1 MG tablet Commonly known as: CATAPRES Take 0.1 mg by mouth 2 (two) times daily.   docusate sodium 100 MG capsule Commonly known as: COLACE Take 1 capsule (100 mg total) by mouth 2 (two) times daily.   Elderberry 575 MG/5ML Syrp Take 10 mLs by mouth daily.   famotidine 40 MG tablet Commonly known as: PEPCID Take  40 mg by mouth daily.   ferrous sulfate 325 (65 FE) MG tablet Take 325 mg by mouth See admin instructions. 3 times a week   HYDROcodone-acetaminophen 5-325 MG tablet Commonly known as: NORCO/VICODIN Take 1-2 tablets by mouth every 4 (four) hours as needed for up to 7 days for moderate pain (pain score 4-6).   latanoprost 0.005 % ophthalmic solution Commonly known as: XALATAN Place 1 drop into both eyes at bedtime.   multivitamin-iron-minerals-folic acid chewable tablet Chew 1 tablet by mouth daily.   OLIVE LEAF EXTRACT  PO Take 1 tablet by mouth daily.   Omega-3 1000 MG Caps Take 1,000 mg by mouth daily.   omeprazole 20 MG capsule Commonly known as: PRILOSEC Take 1 capsule (20 mg total) by mouth daily.   OPCON-A OP Place 1 drop into both eyes daily as needed (for eye allergy).   senna 8.6 MG Tabs tablet Commonly known as: SENOKOT Take 1 tablet (8.6 mg total) by mouth daily as needed for mild constipation.   sodium chloride 2 % ophthalmic solution Commonly known as: MURO 128 Place 1 drop into both eyes 2 (two) times daily as needed for eye irritation.      Allergies  Allergen Reactions  . Penicillins Other (See Comments)    unknown  . Prednisone Other (See Comments)    unknown  . Statins Other (See Comments)    shaking  . Sulfonamide Derivatives Other (See Comments)    unknown  . Tramadol Other (See Comments)    unknown    Follow-up Information    Swinteck, Aaron Edelman, MD. Schedule an appointment as soon as possible for a visit in 2 weeks.   Specialty: Orthopedic Surgery Why: For wound re-check Contact information: [address] [place] [address] [place] [address] [phone]        [facility] Follow up.   Specialty: Home Health Services Why: This agency will provide your Physical Therapy. The agency will contact you prior to the first visit.  Contact information: [place] [address] [phone]                The results of significant diagnostics from this hospitalization (including imaging, microbiology, ancillary and laboratory) are listed below for reference.    Significant Diagnostic Studies: Pelvis Portable  Result Date: 05/02/2020 CLINICAL DATA:  Status post right hip replacement EXAM: PORTABLE PELVIS 1-2 VIEWS COMPARISON:  Intraoperative films from earlier in the same day. FINDINGS: Right hip prosthesis is noted in satisfactory position. No acute bony or soft tissue abnormality is noted. IMPRESSION: Status post  right hip replacement Electronically Signed   By: Inez Catalina M.D.   On: 05/02/2020 19:03   DG Chest Port 1 View  Result Date: 04/30/2020 CLINICAL DATA:  Hip fracture, preop evaluation EXAM: PORTABLE CHEST 1 VIEW COMPARISON:  None. FINDINGS: The heart size and mediastinal contours are within normal limits. Slight, likely chronic interstitial prominence. No pleural effusion. The visualized skeletal structures are unremarkable. IMPRESSION: No acute process in the chest. Electronically Signed   By: Macy Mis M.D.   On: 04/30/2020 20:09   DG Knee Right Port  Result Date: 05/01/2020 CLINICAL DATA:  Pain following recent fall EXAM: PORTABLE RIGHT KNEE - 1-2 VIEW COMPARISON:  None. FINDINGS: Frontal and lateral views were obtained. No fracture or dislocation. No joint effusion. Joint spaces appear normal. No erosive change. IMPRESSION: No fracture, dislocation, or joint effusion. No evident arthropathy. Electronically Signed   By: Gwyndolyn Saxon  Jasmine December III M.D.   On: 05/01/2020 08:26   DG C-Arm 1-60 Min-No Report  Result Date: 05/02/2020 Fluoroscopy was utilized by the requesting physician.  No radiographic interpretation.   DG HIP OPERATIVE UNILAT W OR W/O PELVIS RIGHT  Result Date: 05/02/2020 CLINICAL DATA:  Proximal right femoral fracture EXAM: OPERATIVE RIGHT HIP WITH PELVIS COMPARISON:  04/30/2020 FLUOROSCOPY TIME:  Radiation Exposure Index (as provided by the fluoroscopic device): 0.97 mGy If the device does not provide the exposure index: Fluoroscopy Time:  9 seconds Number of Acquired Images:  4 FINDINGS: Initial images again demonstrate the subcapital femoral neck fracture with impaction at the fracture site. Subsequent right hip prosthesis is noted in satisfactory position. No acute soft tissue abnormality is seen. IMPRESSION: Status post right hip replacement Electronically Signed   By: Inez Catalina M.D.   On: 05/02/2020 19:04   DG Hip Unilat W or Wo Pelvis 2-3 Views Right  Result Date:  04/30/2020 CLINICAL DATA:  Fall from second run of stepladder and onto right hip EXAM: DG HIP (WITH OR WITHOUT PELVIS) 2-3V RIGHT COMPARISON:  CT 12/10/2016 FINDINGS: There is a subcapital fracture dislocation of the right proximal femur with slight anterior displacement and varus angulation across the fracture line. Right femoral head remains normally located in the acetabulum. Proximal left femur is intact and normally located. Bones of the pelvis appear intact and congruent. Right lateral hip swelling. Degenerative changes present in the spine, hips and pelvis. No other acute osseous or soft tissue abnormality. IMPRESSION: Mildly displaced and angulated subcapital fracture of the right proximal femur. Electronically Signed   By: Lovena Le M.D.   On: 04/30/2020 19:51    Microbiology: Recent Results (from the past 240 hour(s))  Respiratory Panel by RT PCR (Flu A&B, Covid) - Nasopharyngeal Swab     Status: None   Collection Time: 04/30/20  8:21 PM   Specimen: Nasopharyngeal Swab  Result Value Ref Range Status   SARS Coronavirus 2 by RT PCR NEGATIVE NEGATIVE Final    Comment: (NOTE) SARS-CoV-2 target nucleic acids are NOT DETECTED.  The SARS-CoV-2 RNA is generally detectable in upper respiratoy specimens during the acute phase of infection. The lowest concentration of SARS-CoV-2 viral copies this assay can detect is 131 copies/mL. A negative result does not preclude SARS-Cov-2 infection and should not be used as the sole basis for treatment or other patient management decisions. A negative result may occur with  improper specimen collection/handling, submission of specimen other than nasopharyngeal swab, presence of viral mutation(s) within the areas targeted by this assay, and inadequate number of viral copies (<131 copies/mL). A negative result must be combined with clinical observations, patient history, and epidemiological information. The expected result is Negative.  Fact Sheet for  Patients:  PinkCheek.be  Fact Sheet for Healthcare Providers:  GravelBags.it  This test is no t yet approved or cleared by the [place] FDA and  has been authorized for detection and/or diagnosis of SARS-CoV-2 by FDA under an Emergency Use Authorization (EUA). This EUA will remain  in effect (meaning this test can be used) for the duration of the COVID-19 declaration under Section 564(b)(1) of the Act, 21 U.S.C. section 360bbb-3(b)(1), unless the authorization is terminated or revoked sooner.     Influenza A by PCR NEGATIVE NEGATIVE Final   Influenza B by PCR NEGATIVE NEGATIVE Final    Comment: (NOTE) The Xpert Xpress SARS-CoV-2/FLU/RSV assay is intended as an aid in  the diagnosis of influenza from Nasopharyngeal swab specimens and  should not be used as a sole basis for treatment. Nasal washings and  aspirates are unacceptable for Xpert Xpress SARS-CoV-2/FLU/RSV  testing.  Fact Sheet for Patients: PinkCheek.be  Fact Sheet for Healthcare Providers: GravelBags.it  This test is not yet approved or cleared by the [place] FDA and  has been authorized for detection and/or diagnosis of SARS-CoV-2 by  FDA under an Emergency Use Authorization (EUA). This EUA will remain  in effect (meaning this test can be used) for the duration of the  Covid-19 declaration under Section 564(b)(1) of the Act, 21  U.S.C. section 360bbb-3(b)(1), unless the authorization is  terminated or revoked. Performed at [facility], [place] [address], [place], [place] [address]   Surgical PCR screen     Status: Abnormal   Collection Time: 05/01/20  1:15 AM   Specimen: Nasal Mucosa; Nasal Swab  Result Value Ref Range Status   MRSA, PCR NEGATIVE NEGATIVE Final   Staphylococcus aureus POSITIVE (A) NEGATIVE Final    Comment: (NOTE) The Xpert SA Assay (FDA approved for  NASAL specimens in patients 68 years of age and older), is one component of a comprehensive surveillance program. It is not intended to diagnose infection nor to guide or monitor treatment. Performed at [facility], [place] [address] [place], [place] [address]      Labs: Basic Metabolic Panel: Recent Labs  Lab 04/30/20 2021 05/01/20 0328 05/03/20 0348 05/04/20 0329  NA 141 139 136 137  K 4.0 3.9 4.3 3.9  CL 106 107 102 102  CO2 26 24 22 26   GLUCOSE 133* 130* 193* 116*  BUN 13 13 17  25*  CREATININE 0.94 0.83 1.09* 1.18*  CALCIUM 9.1 8.6* 8.1* 8.3*   Liver Function Tests: Recent Labs  Lab 05/01/20 0328  AST 28  ALT 21  ALKPHOS 50  BILITOT 0.9  PROT 6.8  ALBUMIN 3.5   No results for input(s): LIPASE, AMYLASE in the last 168 hours. No results for input(s): AMMONIA in the last 168 hours. CBC: Recent Labs  Lab 04/30/20 2021 05/01/20 0328 05/03/20 0348 05/04/20 0329  WBC 14.5* 9.6 14.9* 13.1*  NEUTROABS 12.1*  --   --   --   HGB 11.9* 11.3* 9.9* 8.1*  HCT 37.1 34.9* 30.8* 25.3*  MCV 90.0 89.9 89.5 89.1  PLT 333 308 217 255       Signed:  Oswald Hillock MD.  [facility] 05/04/2020, 1:03 PM

## 2020-05-06 ENCOUNTER — Emergency Department (HOSPITAL_COMMUNITY): Payer: Medicare Other

## 2020-05-06 ENCOUNTER — Inpatient Hospital Stay (HOSPITAL_COMMUNITY)
Admission: EM | Admit: 2020-05-06 | Discharge: 2020-05-10 | DRG: 246 | Disposition: A | Discharge status: Home Health | Payer: Medicare Other | Attending: Student | Admitting: Student

## 2020-05-06 ENCOUNTER — Encounter (HOSPITAL_COMMUNITY): Payer: Self-pay

## 2020-05-06 ENCOUNTER — Other Ambulatory Visit: Payer: Self-pay

## 2020-05-06 DIAGNOSIS — R0902 Hypoxemia: Secondary | ICD-10-CM | POA: Diagnosis not present

## 2020-05-06 DIAGNOSIS — Z79899 Other long term (current) drug therapy: Secondary | ICD-10-CM | POA: Diagnosis not present

## 2020-05-06 DIAGNOSIS — K219 Gastro-esophageal reflux disease without esophagitis: Secondary | ICD-10-CM | POA: Diagnosis present

## 2020-05-06 DIAGNOSIS — F32A Depression, unspecified: Secondary | ICD-10-CM | POA: Diagnosis not present

## 2020-05-06 DIAGNOSIS — I110 Hypertensive heart disease with heart failure: Secondary | ICD-10-CM | POA: Diagnosis present

## 2020-05-06 DIAGNOSIS — R0689 Other abnormalities of breathing: Secondary | ICD-10-CM | POA: Diagnosis not present

## 2020-05-06 DIAGNOSIS — I509 Heart failure, unspecified: Secondary | ICD-10-CM

## 2020-05-06 DIAGNOSIS — Z888 Allergy status to other drugs, medicaments and biological substances status: Secondary | ICD-10-CM

## 2020-05-06 DIAGNOSIS — N200 Calculus of kidney: Secondary | ICD-10-CM | POA: Diagnosis not present

## 2020-05-06 DIAGNOSIS — I447 Left bundle-branch block, unspecified: Secondary | ICD-10-CM

## 2020-05-06 DIAGNOSIS — Y929 Unspecified place or not applicable: Secondary | ICD-10-CM

## 2020-05-06 DIAGNOSIS — S72041A Displaced fracture of base of neck of right femur, initial encounter for closed fracture: Secondary | ICD-10-CM | POA: Diagnosis not present

## 2020-05-06 DIAGNOSIS — E871 Hypo-osmolality and hyponatremia: Secondary | ICD-10-CM | POA: Diagnosis not present

## 2020-05-06 DIAGNOSIS — I25110 Atherosclerotic heart disease of native coronary artery with unstable angina pectoris: Secondary | ICD-10-CM | POA: Diagnosis present

## 2020-05-06 DIAGNOSIS — F410 Panic disorder [episodic paroxysmal anxiety] without agoraphobia: Secondary | ICD-10-CM | POA: Diagnosis present

## 2020-05-06 DIAGNOSIS — R112 Nausea with vomiting, unspecified: Secondary | ICD-10-CM | POA: Diagnosis not present

## 2020-05-06 DIAGNOSIS — K5900 Constipation, unspecified: Secondary | ICD-10-CM

## 2020-05-06 DIAGNOSIS — I351 Nonrheumatic aortic (valve) insufficiency: Secondary | ICD-10-CM | POA: Diagnosis not present

## 2020-05-06 DIAGNOSIS — D649 Anemia, unspecified: Secondary | ICD-10-CM | POA: Diagnosis not present

## 2020-05-06 DIAGNOSIS — I10 Essential (primary) hypertension: Secondary | ICD-10-CM | POA: Diagnosis not present

## 2020-05-06 DIAGNOSIS — R52 Pain, unspecified: Secondary | ICD-10-CM | POA: Diagnosis not present

## 2020-05-06 DIAGNOSIS — I214 Non-ST elevation (NSTEMI) myocardial infarction: Principal | ICD-10-CM | POA: Diagnosis present

## 2020-05-06 DIAGNOSIS — R5381 Other malaise: Secondary | ICD-10-CM | POA: Diagnosis not present

## 2020-05-06 DIAGNOSIS — Z96641 Presence of right artificial hip joint: Secondary | ICD-10-CM | POA: Diagnosis present

## 2020-05-06 DIAGNOSIS — I252 Old myocardial infarction: Secondary | ICD-10-CM | POA: Diagnosis present

## 2020-05-06 DIAGNOSIS — Z87891 Personal history of nicotine dependence: Secondary | ICD-10-CM | POA: Diagnosis not present

## 2020-05-06 DIAGNOSIS — T502X5A Adverse effect of carbonic-anhydrase inhibitors, benzothiadiazides and other diuretics, initial encounter: Secondary | ICD-10-CM | POA: Diagnosis present

## 2020-05-06 DIAGNOSIS — Z20822 Contact with and (suspected) exposure to covid-19: Secondary | ICD-10-CM | POA: Diagnosis not present

## 2020-05-06 DIAGNOSIS — S7291XA Unspecified fracture of right femur, initial encounter for closed fracture: Secondary | ICD-10-CM | POA: Diagnosis present

## 2020-05-06 DIAGNOSIS — R0981 Nasal congestion: Secondary | ICD-10-CM | POA: Diagnosis not present

## 2020-05-06 DIAGNOSIS — Z882 Allergy status to sulfonamides status: Secondary | ICD-10-CM

## 2020-05-06 DIAGNOSIS — D62 Acute posthemorrhagic anemia: Secondary | ICD-10-CM | POA: Diagnosis present

## 2020-05-06 DIAGNOSIS — I959 Hypotension, unspecified: Secondary | ICD-10-CM | POA: Diagnosis not present

## 2020-05-06 DIAGNOSIS — Z8249 Family history of ischemic heart disease and other diseases of the circulatory system: Secondary | ICD-10-CM | POA: Diagnosis not present

## 2020-05-06 DIAGNOSIS — I5033 Acute on chronic diastolic (congestive) heart failure: Secondary | ICD-10-CM | POA: Diagnosis present

## 2020-05-06 DIAGNOSIS — I2510 Atherosclerotic heart disease of native coronary artery without angina pectoris: Secondary | ICD-10-CM | POA: Diagnosis not present

## 2020-05-06 DIAGNOSIS — E876 Hypokalemia: Secondary | ICD-10-CM | POA: Diagnosis present

## 2020-05-06 DIAGNOSIS — I361 Nonrheumatic tricuspid (valve) insufficiency: Secondary | ICD-10-CM | POA: Diagnosis not present

## 2020-05-06 DIAGNOSIS — I340 Nonrheumatic mitral (valve) insufficiency: Secondary | ICD-10-CM | POA: Diagnosis not present

## 2020-05-06 DIAGNOSIS — Z880 Allergy status to penicillin: Secondary | ICD-10-CM | POA: Diagnosis not present

## 2020-05-06 DIAGNOSIS — Z955 Presence of coronary angioplasty implant and graft: Secondary | ICD-10-CM

## 2020-05-06 DIAGNOSIS — N179 Acute kidney failure, unspecified: Secondary | ICD-10-CM | POA: Diagnosis present

## 2020-05-06 DIAGNOSIS — J9601 Acute respiratory failure with hypoxia: Secondary | ICD-10-CM | POA: Diagnosis not present

## 2020-05-06 DIAGNOSIS — K5730 Diverticulosis of large intestine without perforation or abscess without bleeding: Secondary | ICD-10-CM | POA: Diagnosis not present

## 2020-05-06 DIAGNOSIS — Z7982 Long term (current) use of aspirin: Secondary | ICD-10-CM

## 2020-05-06 DIAGNOSIS — K5901 Slow transit constipation: Secondary | ICD-10-CM | POA: Diagnosis present

## 2020-05-06 DIAGNOSIS — I5023 Acute on chronic systolic (congestive) heart failure: Secondary | ICD-10-CM | POA: Diagnosis not present

## 2020-05-06 DIAGNOSIS — S72041D Displaced fracture of base of neck of right femur, subsequent encounter for closed fracture with routine healing: Secondary | ICD-10-CM | POA: Diagnosis not present

## 2020-05-06 DIAGNOSIS — D638 Anemia in other chronic diseases classified elsewhere: Secondary | ICD-10-CM | POA: Diagnosis not present

## 2020-05-06 LAB — COMPREHENSIVE METABOLIC PANEL
ALT: 24 U/L (ref 0–44)
AST: 60 U/L — ABNORMAL HIGH (ref 15–41)
Albumin: 2.7 g/dL — ABNORMAL LOW (ref 3.5–5.0)
Alkaline Phosphatase: 49 U/L (ref 38–126)
Anion gap: 11 (ref 5–15)
BUN: 12 mg/dL (ref 8–23)
CO2: 24 mmol/L (ref 22–32)
Calcium: 8.1 mg/dL — ABNORMAL LOW (ref 8.9–10.3)
Chloride: 102 mmol/L (ref 98–111)
Creatinine, Ser: 0.7 mg/dL (ref 0.44–1.00)
GFR, Estimated: >60 mL/min (ref >60)
Glucose, Bld: 122 mg/dL — ABNORMAL HIGH (ref 70–99)
Potassium: 3.3 mmol/L — ABNORMAL LOW (ref 3.5–5.1)
Sodium: 137 mmol/L (ref 135–145)
Total Bilirubin: 0.6 mg/dL (ref 0.3–1.2)
Total Protein: 5.8 g/dL — ABNORMAL LOW (ref 6.5–8.1)

## 2020-05-06 LAB — CBC WITH DIFFERENTIAL/PLATELET
Abs Immature Granulocytes: 0.09 10*3/uL — ABNORMAL HIGH (ref 0.00–0.07)
Basophils Absolute: 0.1 10*3/uL (ref 0.0–0.1)
Basophils Relative: 1 %
Eosinophils Absolute: 0.1 10*3/uL (ref 0.0–0.5)
Eosinophils Relative: 1 %
HCT: 24.5 % — ABNORMAL LOW (ref 36.0–46.0)
Hemoglobin: 7.9 g/dL — ABNORMAL LOW (ref 12.0–15.0)
Immature Granulocytes: 1 %
Lymphocytes Relative: 9 %
Lymphs Abs: 1 10*3/uL (ref 0.7–4.0)
MCH: 29.5 pg (ref 26.0–34.0)
MCHC: 32.2 g/dL (ref 30.0–36.0)
MCV: 91.4 fL (ref 80.0–100.0)
Monocytes Absolute: 0.7 10*3/uL (ref 0.1–1.0)
Monocytes Relative: 7 %
Neutro Abs: 8.2 10*3/uL — ABNORMAL HIGH (ref 1.7–7.7)
Neutrophils Relative %: 81 %
Platelets: 290 10*3/uL (ref 150–400)
RBC: 2.68 MIL/uL — ABNORMAL LOW (ref 3.87–5.11)
RDW: 13.5 % (ref 11.5–15.5)
WBC: 10.1 10*3/uL (ref 4.0–10.5)
nRBC: 0.4 % — ABNORMAL HIGH (ref 0.0–0.2)

## 2020-05-06 LAB — CREATININE, SERUM
Creatinine, Ser: 0.81 mg/dL (ref 0.44–1.00)
GFR, Estimated: >60 mL/min (ref >60)

## 2020-05-06 LAB — TROPONIN I (HIGH SENSITIVITY)
Troponin I (High Sensitivity): 2365 ng/L (ref <18)
Troponin I (High Sensitivity): 3297 ng/L (ref <18)

## 2020-05-06 LAB — URINALYSIS, ROUTINE W REFLEX MICROSCOPIC
Bilirubin Urine: NEGATIVE
Glucose, UA: NEGATIVE mg/dL
Hgb urine dipstick: NEGATIVE
Ketones, ur: 5 mg/dL — AB
Leukocytes,Ua: NEGATIVE
Nitrite: NEGATIVE
Protein, ur: NEGATIVE mg/dL
Specific Gravity, Urine: 1.012 (ref 1.005–1.030)
pH: 5 (ref 5.0–8.0)

## 2020-05-06 LAB — RESPIRATORY PANEL BY RT PCR (FLU A&B, COVID)
Influenza A by PCR: NEGATIVE
Influenza B by PCR: NEGATIVE
SARS Coronavirus 2 by RT PCR: NEGATIVE

## 2020-05-06 LAB — BRAIN NATRIURETIC PEPTIDE: B Natriuretic Peptide: 1221.6 pg/mL — ABNORMAL HIGH (ref 0.0–100.0)

## 2020-05-06 LAB — LIPASE, BLOOD: Lipase: 19 U/L (ref 11–51)

## 2020-05-06 MED ORDER — FUROSEMIDE 10 MG/ML IJ SOLN
20.0000 mg | Freq: Once | INTRAMUSCULAR | Status: AC
  Administered 2020-05-06: 20 mg via INTRAVENOUS
  Filled 2020-05-06: qty 4

## 2020-05-06 MED ORDER — ACETAMINOPHEN 325 MG PO TABS: 650.0000 mg | ORAL_TABLET | Freq: Four times a day (QID) | ORAL | Status: DC | PRN

## 2020-05-06 MED ORDER — VITAMIN D 25 MCG (1000 UNIT) PO TABS
1000.0000 [IU] | ORAL_TABLET | Freq: Every day | ORAL | Status: DC
  Administered 2020-05-06 – 2020-05-10 (×5): 1000 [IU] via ORAL
  Filled 2020-05-06 (×4): qty 1

## 2020-05-06 MED ORDER — ADULT MULTIVITAMIN W/MINERALS CH
1.0000 | ORAL_TABLET | Freq: Every day | ORAL | Status: DC
  Administered 2020-05-06 – 2020-05-10 (×5): 1 via ORAL
  Filled 2020-05-06 (×5): qty 1

## 2020-05-06 MED ORDER — SODIUM CHLORIDE (HYPERTONIC) 2 % OP SOLN
1.0000 [drp] | Freq: Two times a day (BID) | OPHTHALMIC | Status: DC | PRN
  Filled 2020-05-06 (×2): qty 15

## 2020-05-06 MED ORDER — FUROSEMIDE 10 MG/ML IJ SOLN
20.0000 mg | Freq: Two times a day (BID) | INTRAMUSCULAR | Status: DC
  Administered 2020-05-06 – 2020-05-08 (×4): 20 mg via INTRAVENOUS
  Filled 2020-05-06 (×2): qty 2
  Filled 2020-05-06: qty 4
  Filled 2020-05-06: qty 2

## 2020-05-06 MED ORDER — SODIUM CHLORIDE 0.9 % IV SOLN: INTRAVENOUS | Status: DC

## 2020-05-06 MED ORDER — CITALOPRAM HYDROBROMIDE 20 MG PO TABS
20.0000 mg | ORAL_TABLET | Freq: Every day | ORAL | Status: DC
  Administered 2020-05-06 – 2020-05-10 (×5): 20 mg via ORAL
  Filled 2020-05-06 (×5): qty 1

## 2020-05-06 MED ORDER — SENNA 8.6 MG PO TABS: 1.0000 | ORAL_TABLET | Freq: Every day | ORAL | Status: DC | PRN

## 2020-05-06 MED ORDER — ACETAMINOPHEN 650 MG RE SUPP: 650.0000 mg | Freq: Four times a day (QID) | RECTAL | Status: DC | PRN

## 2020-05-06 MED ORDER — METOPROLOL TARTRATE 5 MG/5ML IV SOLN: 5.0000 mg | Freq: Four times a day (QID) | INTRAVENOUS | Status: DC | PRN

## 2020-05-06 MED ORDER — FERROUS SULFATE 325 (65 FE) MG PO TABS
325.0000 mg | ORAL_TABLET | ORAL | Status: DC
  Administered 2020-05-07 – 2020-05-09 (×2): 325 mg via ORAL
  Filled 2020-05-06 (×2): qty 1

## 2020-05-06 MED ORDER — ENOXAPARIN SODIUM 40 MG/0.4ML ~~LOC~~ SOLN
40.0000 mg | SUBCUTANEOUS | Status: DC
  Administered 2020-05-06: 40 mg via SUBCUTANEOUS
  Filled 2020-05-06: qty 0.4

## 2020-05-06 MED ORDER — SODIUM CHLORIDE (PF) 0.9 % IJ SOLN
INTRAMUSCULAR | Status: AC
  Filled 2020-05-06: qty 50

## 2020-05-06 MED ORDER — ASPIRIN EC 81 MG PO TBEC: 81.0000 mg | DELAYED_RELEASE_TABLET | Freq: Every day | ORAL | Status: DC

## 2020-05-06 MED ORDER — CLONIDINE HCL 0.1 MG PO TABS
0.1000 mg | ORAL_TABLET | Freq: Two times a day (BID) | ORAL | Status: DC
  Administered 2020-05-06 – 2020-05-09 (×5): 0.1 mg via ORAL
  Filled 2020-05-06 (×5): qty 1

## 2020-05-06 MED ORDER — SODIUM CHLORIDE 0.9% FLUSH
3.0000 mL | Freq: Two times a day (BID) | INTRAVENOUS | Status: DC
  Administered 2020-05-06 – 2020-05-09 (×5): 3 mL via INTRAVENOUS

## 2020-05-06 MED ORDER — LATANOPROST 0.005 % OP SOLN
1.0000 [drp] | Freq: Every day | OPHTHALMIC | Status: DC
  Administered 2020-05-06 – 2020-05-08 (×3): 1 [drp] via OPHTHALMIC
  Filled 2020-05-06: qty 2.5

## 2020-05-06 MED ORDER — FAMOTIDINE 20 MG PO TABS
40.0000 mg | ORAL_TABLET | Freq: Every day | ORAL | Status: DC
  Administered 2020-05-06 – 2020-05-10 (×5): 40 mg via ORAL
  Filled 2020-05-06 (×4): qty 2

## 2020-05-06 MED ORDER — IOHEXOL 300 MG/ML  SOLN
100.0000 mL | Freq: Once | INTRAMUSCULAR | Status: AC | PRN
  Administered 2020-05-06: 100 mL via INTRAVENOUS

## 2020-05-06 MED ORDER — DOCUSATE SODIUM 100 MG PO CAPS
100.0000 mg | ORAL_CAPSULE | Freq: Two times a day (BID) | ORAL | Status: DC
  Administered 2020-05-06 – 2020-05-10 (×5): 100 mg via ORAL
  Filled 2020-05-06 (×7): qty 1

## 2020-05-06 MED ORDER — HYDROCODONE-ACETAMINOPHEN 5-325 MG PO TABS: 1.0000 | ORAL_TABLET | ORAL | Status: DC | PRN

## 2020-05-06 MED ORDER — OMEGA-3-ACID ETHYL ESTERS 1 G PO CAPS
1000.0000 mg | ORAL_CAPSULE | Freq: Every day | ORAL | Status: DC
  Administered 2020-05-07 – 2020-05-10 (×3): 1000 mg via ORAL
  Filled 2020-05-06 (×4): qty 1

## 2020-05-06 MED ORDER — LORATADINE 10 MG PO TABS
10.0000 mg | ORAL_TABLET | Freq: Every day | ORAL | Status: AC
  Administered 2020-05-06 – 2020-05-10 (×5): 10 mg via ORAL
  Filled 2020-05-06 (×5): qty 1

## 2020-05-06 NOTE — ED Triage Notes (Signed)
She underwent right total hip Wed. (four days ago). She is here today with c/o n/v several episodes since midnight. She arrives in no distress.

## 2020-05-06 NOTE — ED Provider Notes (Signed)
[facility] DEPT Provider Note   CSN: [id number] Arrival date & time: 05/06/20  0930     History Chief Complaint  Patient presents with  . Emesis    Patricia Garcia is a 86 y.o. female.  HPI   Pt had surgery last week on her hip.  Pt has been recovering at home.  She feels like she has been doing well.  She has been able to get up and walk around without pain.  Last night she started having nausea and vomiting. She has been up most of the night with vomiting.  She denies any abdominal pain.  No diarrhea.  She is having soft stools but she is on a stool softener.  She is not sure what triggered the vomiting.  She has been taking the pain medications but has not had issues with nausea or vomiting prior to last night.  Pt denies any trouble with chest pain or shortness of breath  Past Medical History:  Diagnosis Date  . Anal or rectal pain   . Arthritis   . Depression   . Diverticulosis of colon (without mention of hemorrhage)   . Hypertension   . Internal hemorrhoids without mention of complication   . Ischemic heart disease   . Osteopenia   . Slow transit constipation     Patient Active Problem List   Diagnosis Date Noted  . Femur fracture, right ([place]) 04/30/2020  . Chronic abdominal pain 09/23/2011  . Gas pain 09/23/2011  . GERD (gastroesophageal reflux disease) 09/23/2011  . Constipation, slow transit 09/23/2011  . Anal or rectal pain 09/23/2011  . GASTRITIS 06/26/2010  . CONSTIPATION 06/21/2010  . RECTAL BLEEDING 06/21/2010  . VITAMIN D DEFICIENCY 06/20/2010  . Anxiety state 06/20/2010  . GLAUCOMA 06/20/2010  . CATARACTS 06/20/2010  . HYPERTENSION, BENIGN 06/20/2010  . SINUSITIS, RECURRENT 06/20/2010  . GERD 06/20/2010  . Disorder of bone and cartilage 06/20/2010  . ABDOMINAL PAIN 06/20/2010  . MICROALBUMINURIA 06/20/2010    Past Surgical History:  Procedure Laterality Date  . ABDOMINAL HYSTERECTOMY    . CATARACT EXTRACTION,  BILATERAL    . MASS EXCISION  06/10/2011   Procedure: EXCISION MASS;  Surgeon: Wynonia Sours, MD;  Location: [facility];  Service: Orthopedics;  Laterality: Right;  excision cyst right thumb IP, debridement IP right thumb  . TONSILLECTOMY    . TOTAL HIP ARTHROPLASTY Right 05/02/2020   Procedure: TOTAL HIP ARTHROPLASTY ANTERIOR APPROACH;  Surgeon: Rod Can, MD;  Location: [facility];  Service: Orthopedics;  Laterality: Right;     OB History   No obstetric history on file.     Family History  Problem Relation Age of Onset  . Colon polyps Mother   . Cancer Father        bladder  . Heart disease Father     Social History   Tobacco Use  . Smoking status: Former Smoker    Quit date: 06/05/1984    Years since quitting: 35.9  . Smokeless tobacco: Never Used  Substance Use Topics  . Alcohol use: No  . Drug use: No    Home Medications Prior to Admission medications   Medication Sig Start Date End Date Taking? Authorizing Provider  ALPRAZolam (XANAX) 0.25 MG tablet Take 0.025 mg by mouth at bedtime as needed for anxiety.     [provider]  aspirin (ASPIRIN CHILDRENS) 81 MG chewable tablet Chew 1 tablet (81 mg total) by mouth 2 (two) times daily  with a meal. 05/03/20 06/17/20  Cherlynn June B, PA  Calcium Carbonate-Vit D-Min (CALCIUM 1200 PO) Take 1,200 mg by mouth daily.    [provider]  cholecalciferol (VITAMIN D) 1000 UNITS tablet Take 1,000 Units by mouth daily.      [provider]  citalopram (CELEXA) 20 MG tablet Take 20 mg by mouth daily.      [provider]  cloNIDine (CATAPRES) 0.1 MG tablet Take 0.1 mg by mouth 2 (two) times daily.    [provider]  docusate sodium (COLACE) 100 MG capsule Take 1 capsule (100 mg total) by mouth 2 (two) times daily. 05/04/20   Oswald Hillock, MD  Elderberry 575 MG/5ML SYRP Take 10 mLs by mouth daily.    [provider]  famotidine (PEPCID) 40 MG tablet Take 40 mg  by mouth daily.    [provider]  ferrous sulfate 325 (65 FE) MG tablet Take 325 mg by mouth See admin instructions. 3 times a week    [provider]  HYDROcodone-acetaminophen (NORCO/VICODIN) 5-325 MG tablet Take 1-2 tablets by mouth every 4 (four) hours as needed for up to 7 days for moderate pain (pain score 4-6). 05/03/20 05/10/20  Cherlynn June B, PA  latanoprost (XALATAN) 0.005 % ophthalmic solution Place 1 drop into both eyes at bedtime.    [provider]  multivitamin-iron-minerals-folic acid (CENTRUM) chewable tablet Chew 1 tablet by mouth daily.      [provider]  Naphazoline-Pheniramine (OPCON-A OP) Place 1 drop into both eyes daily as needed (for eye allergy).    [provider]  OLIVE LEAF EXTRACT PO Take 1 tablet by mouth daily.    [provider]  Omega-3 1000 MG CAPS Take 1,000 mg by mouth daily.    [provider]  omeprazole (PRILOSEC) 20 MG capsule Take 1 capsule (20 mg total) by mouth daily. Patient not taking: Reported on 04/30/2020 03/24/12   Sable Feil, MD  senna (SENOKOT) 8.6 MG TABS tablet Take 1 tablet (8.6 mg total) by mouth daily as needed for mild constipation. 05/04/20   Oswald Hillock, MD  sodium chloride (MURO 128) 2 % ophthalmic solution Place 1 drop into both eyes 2 (two) times daily as needed for eye irritation.    [provider]    Allergies    Penicillins, Prednisone, Statins, Sulfonamide derivatives, and Tramadol  Review of Systems   Review of Systems  All other systems reviewed and are negative.   Physical Exam Updated Vital Signs BP (!) 158/72   Pulse (!) 105   Temp 98.6 F (37 C) (Oral)   Resp 17   Ht 1.6 m (5\' 3" )   Wt 56.7 kg   SpO2 95%   BMI 22.14 kg/m   Physical Exam Vitals and nursing note reviewed.  Constitutional:      Appearance: She is well-developed. She is not toxic-appearing or diaphoretic.  HENT:     Head: Normocephalic and atraumatic.      Right Ear: External ear normal.     Left Ear: External ear normal.  Eyes:     General: No scleral icterus.       Right eye: No discharge.        Left eye: No discharge.     Conjunctiva/sclera: Conjunctivae normal.  Neck:     Trachea: No tracheal deviation.  Cardiovascular:     Rate and Rhythm: Normal rate and regular rhythm.  Pulmonary:     Effort:  Pulmonary effort is normal. No respiratory distress.     Breath sounds: Normal breath sounds. No stridor. No wheezing or rales.  Abdominal:     General: Bowel sounds are normal. There is no distension.     Palpations: Abdomen is soft.     Tenderness: There is no abdominal tenderness. There is no guarding or rebound.  Musculoskeletal:        General: No tenderness.     Cervical back: Neck supple.     Comments: Edema noted around incision site but no erythema or drainage, no ttp  Skin:    General: Skin is warm and dry.     Coloration: Skin is pale.     Findings: No rash.  Neurological:     Mental Status: She is alert.     Cranial Nerves: No cranial nerve deficit (no facial droop, extraocular movements intact, no slurred speech).     Sensory: No sensory deficit.     Motor: No abnormal muscle tone or seizure activity.     Coordination: Coordination normal.     ED Results / Procedures / Treatments   Labs (all labs ordered are listed, but only abnormal results are displayed) Labs Reviewed  COMPREHENSIVE METABOLIC PANEL - Abnormal; Notable for the following components:      Result Value   Potassium 3.3 (*)    Glucose, Bld 122 (*)    Calcium 8.1 (*)    Total Protein 5.8 (*)    Albumin 2.7 (*)    AST 60 (*)    All other components within normal limits  CBC WITH DIFFERENTIAL/PLATELET - Abnormal; Notable for the following components:   RBC 2.68 (*)    Hemoglobin 7.9 (*)    HCT 24.5 (*)    nRBC 0.4 (*)    Neutro Abs 8.2 (*)    Abs Immature Granulocytes 0.09 (*)    All other components within normal limits  URINALYSIS, ROUTINE  W REFLEX MICROSCOPIC - Abnormal; Notable for the following components:   Ketones, ur 5 (*)    All other components within normal limits  RESPIRATORY PANEL BY RT PCR (FLU A&B, COVID)  LIPASE, BLOOD  BRAIN NATRIURETIC PEPTIDE    EKG None  Radiology CT ABDOMEN PELVIS W CONTRAST  Result Date: 05/06/2020 CLINICAL DATA:  Nausea, vomiting. EXAM: CT ABDOMEN AND PELVIS WITH CONTRAST TECHNIQUE: Multidetector CT imaging of the abdomen and pelvis was performed using the standard protocol following bolus administration of intravenous contrast. CONTRAST:  178mL OMNIPAQUE IOHEXOL 300 MG/ML  SOLN COMPARISON:  10/10/2016 FINDINGS: Lower chest: Small bilateral pleural effusions. Dependent atelectasis in the lower lobes. Hepatobiliary: 14 mm low-density lesion in the left hepatic lobe is stable since prior study compatible with cyst. Adjacent enhancing lesion most compatible with hemangioma. No suspicious focal hepatic abnormality. Gallbladder unremarkable. Pancreas: No focal abnormality or ductal dilatation. Spleen: No focal abnormality.  Normal size. Adrenals/Urinary Tract: Previously seen right renal stone has migrated into the right renal pelvis, layering dependently and nonobstructing. This measures 7 mm. No hydronephrosis. Adrenal glands and urinary bladder grossly unremarkable. Gas within the urinary bladder presumably from recent catheterization. Stomach/Bowel: Left colonic diverticulosis. No active diverticulitis. Stomach and small bowel unremarkable. Vascular/Lymphatic: Aortic atherosclerosis. No evidence of aneurysm or adenopathy. Reproductive: Prior hysterectomy.  No adnexal masses. Other: No free fluid or free air. Musculoskeletal: Prior right hip replacement. IMPRESSION: 7 mm stone layering dependently in the right renal pelvis. No obstruction. Small bilateral pleural effusions.  Dependent atelectasis. Left colonic diverticulosis.  No active  diverticulitis. Aortic atherosclerosis. Electronically Signed    By: Rolm Baptise M.D.   On: 05/06/2020 15:20   DG Abd Acute W/Chest  Result Date: 05/06/2020 CLINICAL DATA:  Vomiting, status post right hip total arthroplasty EXAM: X-RAY ABDOMEN 2 VIEW COMPARISON:  None. FINDINGS: There is no evidence of dilated bowel loops or free intraperitoneal air. Right-sided renal calculus. Multiple phleboliths in the pelvis. Mild cardiomegaly. Mild, diffuse bilateral interstitial pulmonary opacity. Status post right hip total arthroplasty. IMPRESSION: 1. Nonobstructive bowel gas pattern. 2. Right-sided nephrolithiasis. 3. Mild, diffuse bilateral interstitial pulmonary opacity, likely edema in the setting of cardiomegaly. No focal airspace opacity. 4. Status post right hip total arthroplasty. Electronically Signed   By: Eddie Candle M.D.   On: 05/06/2020 11:22    Procedures Procedures (including critical care time)  Medications Ordered in ED Medications  sodium chloride (PF) 0.9 % injection (has no administration in time range)  iohexol (OMNIPAQUE) 300 MG/ML solution 100 mL (100 mLs Intravenous Contrast Given 05/06/20 1433)    ED Course  I have reviewed the triage vital signs and the nursing notes.  Pertinent labs & imaging results that were available during my care of the patient were reviewed by me and considered in my medical decision making (see chart for details).  Clinical Course as of May 06 1546  Nancy Fetter May 06, 2020  1412 X-ray findings reviewed.  No definite obstruction.  With the patient's nausea and vomiting will CT to evaluate further   [JK]  1412 Anemia noted but stable compared to previous   [JK]  1412 Covid test is negative.   [BJ]  4782 Pt does have an oxygen requirement.     [JK]    Clinical Course User Index [JK] Dorie Rank, MD   MDM Rules/Calculators/A&P                          Patient presented to the ED for evaluation of nausea vomiting after recently being released from the hospital couple days ago for hip surgery.  Patient was given  antiemetics and her nausea and vomiting has improved.  CT scan does not show any evidence of obstruction or other acute process.  Patient did not have any respiratory complaints however she does have a new oxygen requirement in the ED.  Patient's chest x-ray confirmed of her acute abdominal series does suggest pulmonary edema.  EKG and BNP of added on.  IV fluids and now been held.  Patient will require admission with her new oxygen requirement.  PE is in the DDX but pt is not having chest pain or shortness of breath.  BNP is pending.  Care turned over to Dr Vanita Panda Final Clinical Impression(s) / ED Diagnoses Final diagnoses:  Acute on chronic congestive heart failure, unspecified heart failure type [facility])     Dorie Rank, MD 05/06/20 1550

## 2020-05-06 NOTE — Progress Notes (Signed)
Called by RN for critical trop of greater than 3000. On assessment, pt has no complaints of SOB or chest pain. EKG is unremarkable. Most likely ischemic demand. Will have cardiology consult placed in am for evaluation.  - EKG - Currently on Lovenox - Aspirin currently on hold - Trend troponins - Echo previously ordered by admitting physician  - Cards consulted  Lovey Newcomer, NP [facility] [phone] [phone]

## 2020-05-06 NOTE — ED Notes (Signed)
Date and time results received: 05/06/20 8:31 PM  (use smartphrase ".now" to insert current time)  Test: Troponin Critical Value: 2365  Name of Provider Notified: X. Blount NP  Orders Received? Or Actions Taken?:

## 2020-05-06 NOTE — H&P (Addendum)
History and Physical        Hospital Admission Note Date: 05/06/2020  Patient name: Patricia Garcia Medical record number: [id number] Date of birth: 1933-02-25 Age: 86 y.o. Gender: female  PCP: Lajean Manes, MD  Patient coming from: Home  At baseline, ambulates: With walker since her recent hip surgery  Chief Complaint    Chief Complaint  Patient presents with  . Emesis      HPI:   This is an 86 year old female with a history of hypertension, anxiety, ischemic cardiomyopathy with LBBB, osteopenia, diverticulosis, recent right femur fracture s/p total hip arthroplasty on 05/02/2020 by Dr. Lyla Glassing and discharged on 10/15 who had been recovering at home and doing well until last night when she started having some nausea and vomiting which kept her up most of the night.  No abdominal pain, diarrhea, no chest pain or shortness of breath.  No leg swelling or any other issues.  Of note, she has been having some postnasal drainage for the past month or so.  ED Course: Afebrile, tachycardic, tachypneic, hypoxic requiring 2-3 LPM.  In the ED she was given antiemetics and her nausea and vomiting improved and CT scan did not show any obstruction or other acute process.  She did not have any respiratory complaints but did have a new oxygen requirement.  CXR in the ED suggested pulmonary edema.  EKG and BNP were added on and IV fluids were held.  BNP found to be 1221.  She was given Lasix 20 mg IV x1.  Other notable labs were K-3.3, AST 60, ALT 24, Hb 7.9 (previously 8.1 on 10/15 but baseline is around 12).  Upon my personal assessment the patient was satting 87 to 88% on 2 LPM requiring up to 4 LPM.  Vitals:   05/06/20 1508 05/06/20 1619  BP: (!) 158/72 (!) 151/72  Pulse: (!) 105 98  Resp: 17 (!) 23  Temp:    SpO2: 95% 94%     Review of Systems:  Review of Systems  HENT: Positive  for congestion.   Respiratory: Negative for shortness of breath and wheezing.        Dry cough since today  Cardiovascular: Negative for chest pain, palpitations and leg swelling.  Gastrointestinal: Positive for nausea and vomiting.  Musculoskeletal: Negative.        Improving right hip pain  All other systems reviewed and are negative.   Medical/Social/Family History   Past Medical History: Past Medical History:  Diagnosis Date  . Anal or rectal pain   . Arthritis   . Depression   . Diverticulosis of colon (without mention of hemorrhage)   . Hypertension   . Internal hemorrhoids without mention of complication   . Ischemic heart disease   . Osteopenia   . Slow transit constipation     Past Surgical History:  Procedure Laterality Date  . ABDOMINAL HYSTERECTOMY    . CATARACT EXTRACTION, BILATERAL    . MASS EXCISION  06/10/2011   Procedure: EXCISION MASS;  Surgeon: Wynonia Sours, MD;  Location: [facility];  Service: Orthopedics;  Laterality: Right;  excision cyst right thumb IP, debridement IP right thumb  . TONSILLECTOMY    . TOTAL  HIP ARTHROPLASTY Right 05/02/2020   Procedure: TOTAL HIP ARTHROPLASTY ANTERIOR APPROACH;  Surgeon: Rod Can, MD;  Location: [facility];  Service: Orthopedics;  Laterality: Right;    Medications: Prior to Admission medications   Medication Sig Start Date End Date Taking? Authorizing Provider  ALPRAZolam (XANAX) 0.25 MG tablet Take 0.025 mg by mouth at bedtime as needed for anxiety.     [provider]  aspirin (ASPIRIN CHILDRENS) 81 MG chewable tablet Chew 1 tablet (81 mg total) by mouth 2 (two) times daily with a meal. 05/03/20 06/17/20  Cherlynn June B, PA  Calcium Carbonate-Vit D-Min (CALCIUM 1200 PO) Take 1,200 mg by mouth daily.    [provider]  cholecalciferol (VITAMIN D) 1000 UNITS tablet Take 1,000 Units by mouth daily.      [provider]  citalopram (CELEXA) 20 MG tablet Take 20 mg by  mouth daily.      [provider]  cloNIDine (CATAPRES) 0.1 MG tablet Take 0.1 mg by mouth 2 (two) times daily.    [provider]  docusate sodium (COLACE) 100 MG capsule Take 1 capsule (100 mg total) by mouth 2 (two) times daily. 05/04/20   Oswald Hillock, MD  Elderberry 575 MG/5ML SYRP Take 10 mLs by mouth daily.    [provider]  famotidine (PEPCID) 40 MG tablet Take 40 mg by mouth daily.    [provider]  ferrous sulfate 325 (65 FE) MG tablet Take 325 mg by mouth See admin instructions. 3 times a week    [provider]  HYDROcodone-acetaminophen (NORCO/VICODIN) 5-325 MG tablet Take 1-2 tablets by mouth every 4 (four) hours as needed for up to 7 days for moderate pain (pain score 4-6). 05/03/20 05/10/20  Cherlynn June B, PA  latanoprost (XALATAN) 0.005 % ophthalmic solution Place 1 drop into both eyes at bedtime.    [provider]  multivitamin-iron-minerals-folic acid (CENTRUM) chewable tablet Chew 1 tablet by mouth daily.      [provider]  Naphazoline-Pheniramine (OPCON-A OP) Place 1 drop into both eyes daily as needed (for eye allergy).    [provider]  OLIVE LEAF EXTRACT PO Take 1 tablet by mouth daily.    [provider]  Omega-3 1000 MG CAPS Take 1,000 mg by mouth daily.    [provider]  omeprazole (PRILOSEC) 20 MG capsule Take 1 capsule (20 mg total) by mouth daily. Patient not taking: Reported on 04/30/2020 03/24/12   Sable Feil, MD  senna (SENOKOT) 8.6 MG TABS tablet Take 1 tablet (8.6 mg total) by mouth daily as needed for mild constipation. 05/04/20   Oswald Hillock, MD  sodium chloride (MURO 128) 2 % ophthalmic solution Place 1 drop into both eyes 2 (two) times daily as needed for eye irritation.    [provider]    Allergies:   Allergies  Allergen Reactions  . Penicillins Other (See Comments)    unknown  . Prednisone Other (See Comments)    unknown  .  Statins Other (See Comments)    shaking  . Sulfonamide Derivatives Other (See Comments)    unknown  . Tramadol Other (See Comments)    unknown    Social History:  reports that she quit smoking about 35 years ago. She has never used smokeless tobacco. She reports that she does not drink alcohol and does not use drugs.  Family History: Family History  Problem Relation Age of Onset  . Colon polyps Mother   .  Cancer Father        bladder  . Heart disease Father      Objective   Physical Exam: Blood pressure (!) 151/72, pulse 98, temperature 98.6 F (37 C), temperature source Oral, resp. rate (!) 23, height 5\' 3"  (1.6 m), weight 56.7 kg, SpO2 94 %.  Physical Exam Vitals and nursing note reviewed.  Constitutional:      Appearance: Normal appearance.  HENT:     Head: Normocephalic and atraumatic.  Eyes:     Conjunctiva/sclera: Conjunctivae normal.  Cardiovascular:     Rate and Rhythm: Normal rate and regular rhythm.  Pulmonary:     Breath sounds: Rales present.     Comments: Conversational dyspnea Satting 87-88% on 2 LPM requiring up to 4 LPM Abdominal:     General: Abdomen is flat.     Palpations: Abdomen is soft.  Musculoskeletal:        General: No swelling or tenderness.  Skin:    Coloration: Skin is not jaundiced or pale.  Neurological:     Mental Status: She is alert. Mental status is at baseline.  Psychiatric:        Mood and Affect: Mood normal.        Behavior: Behavior normal.     LABS on Admission: I have personally reviewed all the labs and imaging below    Basic Metabolic Panel: Recent Labs  Lab 05/04/20 0329 05/06/20 1106  NA 137 137  K 3.9 3.3*  CL 102 102  CO2 26 24  GLUCOSE 116* 122*  BUN 25* 12  CREATININE 1.18* 0.70  CALCIUM 8.3* 8.1*   Liver Function Tests: Recent Labs  Lab 05/01/20 0328 05/06/20 1106  AST 28 60*  ALT 21 24  ALKPHOS 50 49  BILITOT 0.9 0.6  PROT 6.8 5.8*  ALBUMIN 3.5 2.7*   Recent Labs  Lab  05/06/20 1106  LIPASE 19   No results for input(s): AMMONIA in the last 168 hours. CBC: Recent Labs  Lab 05/04/20 0329 05/04/20 0329 05/06/20 1106  WBC 13.1*  --  10.1  NEUTROABS  --   --  8.2*  HGB 8.1*  --  7.9*  HCT 25.3*  --  24.5*  MCV 89.1   < > 91.4  PLT 255  --  290   < > = values in this interval not displayed.   Cardiac Enzymes: No results for input(s): CKTOTAL, CKMB, CKMBINDEX, TROPONINI in the last 168 hours. BNP: Invalid input(s): POCBNP CBG: No results for input(s): GLUCAP in the last 168 hours.  Radiological Exams on Admission:  CT ABDOMEN PELVIS W CONTRAST  Result Date: 05/06/2020 CLINICAL DATA:  Nausea, vomiting. EXAM: CT ABDOMEN AND PELVIS WITH CONTRAST TECHNIQUE: Multidetector CT imaging of the abdomen and pelvis was performed using the standard protocol following bolus administration of intravenous contrast. CONTRAST:  118mL OMNIPAQUE IOHEXOL 300 MG/ML  SOLN COMPARISON:  10/10/2016 FINDINGS: Lower chest: Small bilateral pleural effusions. Dependent atelectasis in the lower lobes. Hepatobiliary: 14 mm low-density lesion in the left hepatic lobe is stable since prior study compatible with cyst. Adjacent enhancing lesion most compatible with hemangioma. No suspicious focal hepatic abnormality. Gallbladder unremarkable. Pancreas: No focal abnormality or ductal dilatation. Spleen: No focal abnormality.  Normal size. Adrenals/Urinary Tract: Previously seen right renal stone has migrated into the right renal pelvis, layering dependently and nonobstructing. This measures 7 mm. No hydronephrosis. Adrenal glands and urinary bladder grossly unremarkable. Gas within the urinary bladder presumably from recent catheterization. Stomach/Bowel: Left colonic  diverticulosis. No active diverticulitis. Stomach and small bowel unremarkable. Vascular/Lymphatic: Aortic atherosclerosis. No evidence of aneurysm or adenopathy. Reproductive: Prior hysterectomy.  No adnexal masses. Other: No  free fluid or free air. Musculoskeletal: Prior right hip replacement. IMPRESSION: 7 mm stone layering dependently in the right renal pelvis. No obstruction. Small bilateral pleural effusions.  Dependent atelectasis. Left colonic diverticulosis.  No active diverticulitis. Aortic atherosclerosis. Electronically Signed   By: Rolm Baptise M.D.   On: 05/06/2020 15:20   DG Abd Acute W/Chest  Result Date: 05/06/2020 CLINICAL DATA:  Vomiting, status post right hip total arthroplasty EXAM: X-RAY ABDOMEN 2 VIEW COMPARISON:  None. FINDINGS: There is no evidence of dilated bowel loops or free intraperitoneal air. Right-sided renal calculus. Multiple phleboliths in the pelvis. Mild cardiomegaly. Mild, diffuse bilateral interstitial pulmonary opacity. Status post right hip total arthroplasty. IMPRESSION: 1. Nonobstructive bowel gas pattern. 2. Right-sided nephrolithiasis. 3. Mild, diffuse bilateral interstitial pulmonary opacity, likely edema in the setting of cardiomegaly. No focal airspace opacity. 4. Status post right hip total arthroplasty. Electronically Signed   By: Eddie Candle M.D.   On: 05/06/2020 11:22      EKG: Independently reviewed.  Appears at baseline   A & P   Principal Problem:   Acute CHF (congestive heart failure) (HCC) Active Problems:   HYPERTENSION, BENIGN   Constipation   GERD (gastroesophageal reflux disease)   Femur fracture, right (HCC)   Acute respiratory failure with hypoxia (HCC)   LBBB (left bundle branch block)   Sinus congestion   1. Acute hypoxic respiratory failure suspect secondary to acute CHF exacerbation of unknown etiology a. New O2 requirement up to 4 LPM b. BNP 1221 with pulmonary edema on CXR and rales on exam indicating this is more likely heart failure as opposed to a PE in the setting of her recent hip surgery c. Stress test in February showed EF of 46% and abnormal septal motion with apical hypokinesis, no echo on file and was otherwise  unremarkable d. Given Lasix 20 mg IV x1 in the ED - will continue IV twice daily e. daily weights and intake/output f. Obtain echo g. Check a troponin  2. S/p recent right hip total arthroplasty on 10/13 by Dr. Lyla Glassing, stable a. PT eval b. Hold aspirin since getting Lovenox c. Continue pain and bowel regimen  3. Sinus congestion with postnasal drip, likely viral a. Possibly viral etiology of her heart failure? b. Per patient, flonase does not work c. Designer, fashion/clothing  4. Nausea/vomiting possibly post tussive from postnasal drip a. Symptomatic care  5. Hypertension a. Continue clonidine  6. Anxiety a. Continue home celexa   DVT prophylaxis: lovenox, scd's   Code Status: Full Code  Diet: heart healthy Family Communication: Admission, patients condition and plan of care including tests being ordered have been discussed with the patient who indicates understanding and agrees with the plan and Code Status. Patient's son was updated  Disposition Plan: The appropriate patient status for this patient is INPATIENT. Inpatient status is judged to be reasonable and necessary in order to provide the required intensity of service to ensure the patient's safety. The patient's presenting symptoms, physical exam findings, and initial radiographic and laboratory data in the context of their chronic comorbidities is felt to place them at high risk for further clinical deterioration. Furthermore, it is not anticipated that the patient will be medically stable for discharge from the hospital within 2 midnights of admission. The following factors support the patient status of inpatient.   "  The patient's presenting symptoms include nausea and vomiting. " The worrisome physical exam findings include rales, hypoxia. " The initial radiographic and laboratory data are worrisome because of pulmonary edema. " The chronic co-morbidities include hypertension, recent hip fracture.   * I certify that at the  point of admission it is my clinical judgment that the patient will require inpatient hospital care spanning beyond 2 midnights from the point of admission due to high intensity of service, high risk for further deterioration and high frequency of surveillance required.*   Status is: Inpatient  Remains inpatient appropriate because:IV treatments appropriate due to intensity of illness or inability to take PO and Inpatient level of care appropriate due to severity of illness   Dispo: The patient is from: Home              Anticipated d/c is to: Home              Anticipated d/c date is: 3 days              Patient currently is not medically stable to d/c.     Consultants  . none  Procedures  . none  Time Spent on Admission: 60 minutes    Harold Hedge, DO [facility]  05/06/2020, 5:53 PM

## 2020-05-06 NOTE — Progress Notes (Addendum)
CRITICAL VALUE ALERT  Critical Value:  Troponin 3297  Date & Time Notied:  05/06/20  Provider Notified: Kennon Holter, NP  Orders Received/Actions taken: EKG obtained.

## 2020-05-07 ENCOUNTER — Inpatient Hospital Stay (HOSPITAL_COMMUNITY): Payer: Medicare Other

## 2020-05-07 DIAGNOSIS — D649 Anemia, unspecified: Secondary | ICD-10-CM | POA: Diagnosis present

## 2020-05-07 DIAGNOSIS — I340 Nonrheumatic mitral (valve) insufficiency: Secondary | ICD-10-CM

## 2020-05-07 DIAGNOSIS — I10 Essential (primary) hypertension: Secondary | ICD-10-CM

## 2020-05-07 DIAGNOSIS — I214 Non-ST elevation (NSTEMI) myocardial infarction: Secondary | ICD-10-CM | POA: Diagnosis not present

## 2020-05-07 DIAGNOSIS — J9601 Acute respiratory failure with hypoxia: Secondary | ICD-10-CM | POA: Diagnosis not present

## 2020-05-07 DIAGNOSIS — I5033 Acute on chronic diastolic (congestive) heart failure: Secondary | ICD-10-CM

## 2020-05-07 DIAGNOSIS — I252 Old myocardial infarction: Secondary | ICD-10-CM | POA: Diagnosis present

## 2020-05-07 DIAGNOSIS — I361 Nonrheumatic tricuspid (valve) insufficiency: Secondary | ICD-10-CM

## 2020-05-07 DIAGNOSIS — K5900 Constipation, unspecified: Secondary | ICD-10-CM | POA: Diagnosis not present

## 2020-05-07 LAB — ECHOCARDIOGRAM COMPLETE
Area-P 1/2: 5.75 cm2
Height: 63 in
S' Lateral: 3.3 cm
Weight: 2169.33 oz

## 2020-05-07 LAB — IRON AND TIBC
Iron: 24 ug/dL — ABNORMAL LOW (ref 28–170)
Saturation Ratios: 10 % — ABNORMAL LOW (ref 10.4–31.8)
TIBC: 239 ug/dL — ABNORMAL LOW (ref 250–450)
UIBC: 215 ug/dL

## 2020-05-07 LAB — BASIC METABOLIC PANEL
Anion gap: 10 (ref 5–15)
BUN: 11 mg/dL (ref 8–23)
CO2: 28 mmol/L (ref 22–32)
Calcium: 8.1 mg/dL — ABNORMAL LOW (ref 8.9–10.3)
Chloride: 100 mmol/L (ref 98–111)
Creatinine, Ser: 0.88 mg/dL (ref 0.44–1.00)
GFR, Estimated: 60 mL/min — ABNORMAL LOW (ref >60)
Glucose, Bld: 118 mg/dL — ABNORMAL HIGH (ref 70–99)
Potassium: 3.2 mmol/L — ABNORMAL LOW (ref 3.5–5.1)
Sodium: 138 mmol/L (ref 135–145)

## 2020-05-07 LAB — VITAMIN B12: Vitamin B-12: 627 pg/mL (ref 180–914)

## 2020-05-07 LAB — FERRITIN: Ferritin: 231 ng/mL (ref 11–307)

## 2020-05-07 LAB — CBC
HCT: 23.9 % — ABNORMAL LOW (ref 36.0–46.0)
Hemoglobin: 7.9 g/dL — ABNORMAL LOW (ref 12.0–15.0)
MCH: 29 pg (ref 26.0–34.0)
MCHC: 33.1 g/dL (ref 30.0–36.0)
MCV: 87.9 fL (ref 80.0–100.0)
Platelets: 283 10*3/uL (ref 150–400)
RBC: 2.72 MIL/uL — ABNORMAL LOW (ref 3.87–5.11)
RDW: 13.6 % (ref 11.5–15.5)
WBC: 10.7 10*3/uL — ABNORMAL HIGH (ref 4.0–10.5)
nRBC: 0.6 % — ABNORMAL HIGH (ref 0.0–0.2)

## 2020-05-07 LAB — TROPONIN I (HIGH SENSITIVITY)
Troponin I (High Sensitivity): 3515 ng/L (ref <18)
Troponin I (High Sensitivity): 3026 ng/L (ref <18)

## 2020-05-07 LAB — PREPARE RBC (CROSSMATCH)

## 2020-05-07 LAB — MAGNESIUM: Magnesium: 1.8 mg/dL (ref 1.7–2.4)

## 2020-05-07 LAB — HEPARIN LEVEL (UNFRACTIONATED): Heparin Unfractionated: 0.16 IU/mL — ABNORMAL LOW (ref 0.30–0.70)

## 2020-05-07 LAB — FOLATE: Folate: 15.7 ng/mL (ref >5.9)

## 2020-05-07 MED ORDER — SODIUM CHLORIDE 0.9% IV SOLUTION: Freq: Once | INTRAVENOUS | Status: DC

## 2020-05-07 MED ORDER — ACETAMINOPHEN 325 MG PO TABS
650.0000 mg | ORAL_TABLET | Freq: Once | ORAL | Status: AC
  Administered 2020-05-07: 650 mg via ORAL
  Filled 2020-05-07: qty 2

## 2020-05-07 MED ORDER — PANTOPRAZOLE SODIUM 40 MG PO TBEC
40.0000 mg | DELAYED_RELEASE_TABLET | Freq: Every day | ORAL | Status: DC
  Administered 2020-05-08 – 2020-05-10 (×3): 40 mg via ORAL
  Filled 2020-05-07 (×3): qty 1

## 2020-05-07 MED ORDER — ASPIRIN 81 MG PO CHEW
81.0000 mg | CHEWABLE_TABLET | Freq: Every day | ORAL | Status: DC
  Administered 2020-05-07 – 2020-05-10 (×3): 81 mg via ORAL
  Filled 2020-05-07 (×4): qty 1

## 2020-05-07 MED ORDER — EZETIMIBE 10 MG PO TABS
10.0000 mg | ORAL_TABLET | Freq: Every day | ORAL | Status: DC
  Administered 2020-05-07 – 2020-05-10 (×4): 10 mg via ORAL
  Filled 2020-05-07 (×4): qty 1

## 2020-05-07 MED ORDER — HEPARIN (PORCINE) 25000 UT/250ML-% IV SOLN: 950.0000 [IU]/h | INTRAVENOUS | Status: DC

## 2020-05-07 MED ORDER — FUROSEMIDE 10 MG/ML IJ SOLN
20.0000 mg | Freq: Once | INTRAMUSCULAR | Status: AC
  Administered 2020-05-08: 20 mg via INTRAVENOUS
  Filled 2020-05-07: qty 2

## 2020-05-07 MED ORDER — DIPHENHYDRAMINE HCL 25 MG PO CAPS
25.0000 mg | ORAL_CAPSULE | Freq: Once | ORAL | Status: AC
  Administered 2020-05-07: 25 mg via ORAL
  Filled 2020-05-07: qty 1

## 2020-05-07 MED ORDER — MAGNESIUM SULFATE 2 GM/50ML IV SOLN
2.0000 g | Freq: Once | INTRAVENOUS | Status: AC
  Administered 2020-05-07: 2 g via INTRAVENOUS
  Filled 2020-05-07: qty 50

## 2020-05-07 MED ORDER — HEPARIN (PORCINE) 25000 UT/250ML-% IV SOLN
750.0000 [IU]/h | INTRAVENOUS | Status: DC
  Administered 2020-05-07: 750 [IU]/h via INTRAVENOUS
  Filled 2020-05-07: qty 250

## 2020-05-07 MED ORDER — METOPROLOL TARTRATE 25 MG PO TABS
25.0000 mg | ORAL_TABLET | Freq: Two times a day (BID) | ORAL | Status: DC
  Administered 2020-05-07 – 2020-05-08 (×3): 25 mg via ORAL
  Filled 2020-05-07 (×3): qty 1

## 2020-05-07 MED ORDER — POTASSIUM CHLORIDE CRYS ER 20 MEQ PO TBCR
40.0000 meq | EXTENDED_RELEASE_TABLET | ORAL | Status: AC
  Administered 2020-05-07 (×2): 40 meq via ORAL
  Filled 2020-05-07 (×2): qty 2

## 2020-05-07 NOTE — H&P (View-Only) (Signed)
Cardiology Consultation:   Patient ID: [name] MRN: [id number]; DOB: July 12, 1933  Admit date: 05/06/2020 Date of Consult: 05/07/2020  Primary Care Provider: Lajean Manes, [facility] Cardiologist: New (Dr. Johney Frame) [place] Electrophysiologist:  None    Patient Profile:   Patricia Garcia is a 86 y.o. female with a history of LBBB, hypertension, diverticulosis, arthritis, depression,a and recent right femur fracture s/p total hip arthroplasty on 05/02/2020 who is being seen today for the evaluation of CHF and markedly elevated troponin at the request of Dr. Grandville Silos.  History of Present Illness:   Ms. Patricia Garcia is a 86 year old female with the above history. She history of ischemic cardiomyopathy listed in her chart but patient is not aware of this. She has never seen a Film/video editor. Her PCP did order a Lexiscan Myoview in 08/2019 which was low risk with no evidence of ischemia. EF was 46% with abnormal septal motion and apical hypokinesis. Patient cannot remember why this test was ordered. She denies any chest pain at the time.   Patient was admitted from 04/30/2020 to 05/04/2020 for right femoral neck fracture after a mechanical fall. She underwent total right hip arthroplasty on 05/02/2020 by Dr. Lyla Glassing. She tolerated procedure well and was discharged with home PT.  Patient returned to [facility] ED on 05/06/2020 with complaints of nausea/vomiting. She was noted to be hypoxic requiring supplemental O2 via nasal cannula.  In the ED, O2 as low as 84% on room air but improved to mid 90's with nasal cannula. EKG showed sinus tachycardia vs ectopic atrial tachycardia with known LBBB. Chest x-ray showed mild, diffuse bilateral interstitial pulmonary opacity felt to be pulmonary edema given cardiomegaly. Abdominal x-ray showed right sided nephrolithiasis and non-obstructive bowel gas pattern. Abdominal/pelvic CT showed 16m stone in the right renal pelvis but no  obstruction. BNP elevated at 1,221. WBC 10.1, Hgb 7.9, Plts 290. Na 147, K 3.3, Glucose 122, BUN 12, Cr 0.70. Albumin 2.7, AST 60, ALT 24, Alk Phos 49, Total Bili 0.6. Lipase normal. Respiratory panel negative for COVID and Influenza. Patient was started on IV Lasix and Echo and troponin were ordered. High-sensitivity troponin came back markedly elevated at 2,365 >> 3,297 >> 3,515 >> 3,026. Patient was started on IV Heparin (without bolus given anemia) and Cardiology consulted.  At the time of this evaluation, patient resting comfortably in no acute distress. She states she was doing well since being discharged following hip surgery until yesterday morning when she just did not feel right. She states she started to feel very nauseous around 4am yesterday with dry heaving and at least one episode of vomiting. Patient notes some mild shortness of breath with this and states she was breathing very quickly prior to vomiting. No chest pain. No palpitations, lightheadedness, dizziness. No orthopnea, PND, or edema. No fevers or recent illnesses. She does note some post-nasal drip which she thinks is likely from allergies. No abnormal bleeding in urine or stools. She denies any hip pain.  Patient has remote smoking history but states she quit in the 1980's. She does have a family history of heart disease. She thinks her father had CHF. He died in his 735'sfrom cancer. She also reports that her mother had heart problems but could not specify.   Past Medical History:  Diagnosis Date  . Anal or rectal pain   . Arthritis   . Depression   . Diverticulosis of colon (without mention of hemorrhage)   . Hypertension   .  Internal hemorrhoids without mention of complication   . Ischemic heart disease   . Osteopenia   . Slow transit constipation     Past Surgical History:  Procedure Laterality Date  . ABDOMINAL HYSTERECTOMY    . CATARACT EXTRACTION, BILATERAL    . MASS EXCISION  06/10/2011   Procedure: EXCISION  MASS;  Surgeon: Wynonia Sours, MD;  Location: [facility];  Service: Orthopedics;  Laterality: Right;  excision cyst right thumb IP, debridement IP right thumb  . TONSILLECTOMY    . TOTAL HIP ARTHROPLASTY Right 05/02/2020   Procedure: TOTAL HIP ARTHROPLASTY ANTERIOR APPROACH;  Surgeon: Rod Can, MD;  Location: [facility];  Service: Orthopedics;  Laterality: Right;     Home Medications:  Prior to Admission medications   Medication Sig Start Date End Date Taking? Authorizing Provider  ALPRAZolam (XANAX) 0.25 MG tablet Take 0.025 mg by mouth at bedtime as needed for anxiety.    Yes [provider]  aspirin (ASPIRIN CHILDRENS) 81 MG chewable tablet Chew 1 tablet (81 mg total) by mouth 2 (two) times daily with a meal. 05/03/20 06/17/20 Yes McCauley, Casimer Leek, PA  Calcium Carbonate-Vit D-Min (CALCIUM 1200 PO) Take 1,200 mg by mouth daily.   Yes [provider]  cholecalciferol (VITAMIN D) 1000 UNITS tablet Take 1,000 Units by mouth daily.     Yes [provider]  citalopram (CELEXA) 20 MG tablet Take 20 mg by mouth daily.     Yes [provider]  cloNIDine (CATAPRES) 0.1 MG tablet Take 0.1 mg by mouth daily.    Yes [provider]  docusate sodium (COLACE) 100 MG capsule Take 1 capsule (100 mg total) by mouth 2 (two) times daily. 05/04/20  Yes Oswald Hillock, MD  famotidine (PEPCID) 40 MG tablet Take 40 mg by mouth daily.   Yes [provider]  ferrous sulfate 325 (65 FE) MG tablet Take 325 mg by mouth See admin instructions. 3 times a week   Yes [provider]  HYDROcodone-acetaminophen (NORCO/VICODIN) 5-325 MG tablet Take 1-2 tablets by mouth every 4 (four) hours as needed for up to 7 days for moderate pain (pain score 4-6). 05/03/20 05/10/20 Yes McCauley, Fritz Pickerel B, PA  latanoprost (XALATAN) 0.005 % ophthalmic solution Place 1 drop into both eyes at bedtime.   Yes [provider]  multivitamin-iron-minerals-folic  acid (CENTRUM) chewable tablet Chew 1 tablet by mouth daily.     Yes [provider]  Naphazoline-Pheniramine (OPCON-A OP) Place 1 drop into both eyes daily as needed (for eye allergy).   Yes [provider]  OLIVE LEAF EXTRACT PO Take 1 tablet by mouth daily.   Yes [provider]  Omega-3 1000 MG CAPS Take 1,000 mg by mouth daily.   Yes [provider]  senna (SENOKOT) 8.6 MG TABS tablet Take 1 tablet (8.6 mg total) by mouth daily as needed for mild constipation. 05/04/20  Yes Oswald Hillock, MD  sodium chloride (MURO 128) 2 % ophthalmic solution Place 1 drop into both eyes 2 (two) times daily as needed for eye irritation.   Yes [provider]  omeprazole (PRILOSEC) 20 MG capsule Take 1 capsule (20 mg total) by mouth daily. Patient not taking: Reported on 04/30/2020 03/24/12   Sable Feil, MD    Inpatient Medications: Scheduled Meds: . aspirin  81 mg Oral Daily  . cholecalciferol  1,000 Units Oral Daily  . citalopram  20 mg Oral Daily  . cloNIDine  0.1  mg Oral BID  . docusate sodium  100 mg Oral BID  . famotidine  40 mg Oral Daily  . ferrous sulfate  325 mg Oral Once per day on Mon Wed Fri  . furosemide  20 mg Intravenous BID  . latanoprost  1 drop Both Eyes QHS  . loratadine  10 mg Oral Daily  . multivitamin with minerals  1 tablet Oral Daily  . omega-3 acid ethyl esters  1,000 mg Oral Daily  . potassium chloride  40 mEq Oral Q4H  . sodium chloride flush  3 mL Intravenous Q12H   Continuous Infusions: . heparin    . magnesium sulfate bolus IVPB 2 g (05/07/20 0912)   PRN Meds: acetaminophen **OR** acetaminophen, HYDROcodone-acetaminophen, metoprolol tartrate, senna, sodium chloride  Allergies:    Allergies  Allergen Reactions  . Penicillins Other (See Comments)    unknown  . Prednisone Other (See Comments)    unknown  . Statins Other (See Comments)    shaking  . Sulfonamide Derivatives Other (See Comments)    unknown  .  Tramadol Other (See Comments)    unknown    Social History:   Social History   Socioeconomic History  . Marital status: Widowed    Spouse name: Not on file  . Number of children: 2  . Years of education: Not on file  . Highest education level: Not on file  Occupational History  . Occupation: Retired    Fish farm manager: RETIRED  Tobacco Use  . Smoking status: Former Smoker    Quit date: 06/05/1984    Years since quitting: 35.9  . Smokeless tobacco: Never Used  Substance and Sexual Activity  . Alcohol use: No  . Drug use: No  . Sexual activity: Not on file  Other Topics Concern  . Not on file  Social History Narrative  . Not on file   Social Determinants of Health   Financial Resource Strain:   . Difficulty of Paying Living Expenses: Not on file  Food Insecurity:   . Worried About Charity fundraiser in the Last Year: Not on file  . Ran Out of Food in the Last Year: Not on file  Transportation Needs:   . Lack of Transportation (Medical): Not on file  . Lack of Transportation (Non-Medical): Not on file  Physical Activity:   . Days of Exercise per Week: Not on file  . Minutes of Exercise per Session: Not on file  Stress:   . Feeling of Stress : Not on file  Social Connections:   . Frequency of Communication with Friends and Family: Not on file  . Frequency of Social Gatherings with Friends and Family: Not on file  . Attends Religious Services: Not on file  . Active Member of Clubs or Organizations: Not on file  . Attends Archivist Meetings: Not on file  . Marital Status: Not on file  Intimate Partner Violence:   . Fear of Current or Ex-Partner: Not on file  . Emotionally Abused: Not on file  . Physically Abused: Not on file  . Sexually Abused: Not on file    Family History:    Family History  Problem Relation Age of Onset  . Colon polyps Mother   . Cancer Father        bladder  . Heart disease Father      ROS:  Please see the history of present  illness.  All other ROS reviewed and negative.     Physical Exam/Data:  Vitals:   05/06/20 2118 05/06/20 2125 05/07/20 0119 05/07/20 0556  BP: (!) 156/82  (!) 124/54 (!) 123/58  Pulse: (!) 107  87 82  Resp: 20  12 (!) 24  Temp: 98.6 F (37 C)  99.2 F (37.3 C) 99.2 F (37.3 C)  TempSrc:   Oral Oral  SpO2: 98%  97% 99%  Weight:  61.5 kg    Height:  _0  (1.6 m)      Intake/Output Summary (Last 24 hours) at 05/07/2020 0959 Last data filed at 05/07/2020 0900 Gross per 24 hour  Intake 120 ml  Output 1300 ml  Net -1180 ml   Last 3 Weights 05/06/2020 05/06/2020 05/01/2020  Weight (lbs) 135 lb 9.3 oz 125 lb 126 lb 4.8 oz  Weight (kg) 61.5 kg 56.7 kg 57.289 kg     Body mass index is 24.02 kg/m.  General: 86 y.o. female resting comfortably in no acute distress. HEENT: Normocephalic and atraumatic. Sclera clear. Neck: Supple. No JVD. Heart: RRR. Distinct S1 and S2. No murmurs, gallops, or rubs. Radial and distal pedal pulses 2+ and equal bilaterally. Lungs: No increased work of breathing. Clear to ausculation bilaterally. No wheezes, rhonchi, or rales.  Abdomen: Soft, non-distended, and non-tender to palpation. Bowel sounds present. Extremities: No lower extremity edema.    Skin: Warm and dry. Neuro: Alert and oriented x3. No focal deficits. Psych: Normal affect. Responds appropriately.  EKG:  The EKG was personally reviewed and demonstrates:  Normal sinus rhythm with 1st degree AV block and known LBBB.  Telemetry:  Telemetry was personally reviewed and demonstrates: Normal sinus rhythm with rates mostly in the 80's to 90's and brief spikes in the low 100's.   Relevant CV Studies:  Lexican Myoview 08/29/2019:  Nuclear stress EF: 46%.  There was no ST segment deviation noted during stress.  This is a low risk study.  The left ventricular ejection fraction is mildly decreased (45-54%).   No ischemia or infarct ECG with LBBB EF estimated at 46% abnormal septal motion  and apical hypokinesis Suggest echo correlation   Laboratory Data:  High Sensitivity Troponin:   Recent Labs  Lab 05/06/20 1859 05/06/20 2148 05/07/20 0524 05/07/20 0725  TROPONINIHS 2,365* 3,297* 3,515* 3,026*     Chemistry Recent Labs  Lab 05/04/20 0329 05/04/20 0329 05/06/20 1106 05/06/20 1859 05/07/20 0524  NA 137  --  137  --  138  K 3.9  --  3.3*  --  3.2*  CL 102  --  102  --  100  CO2 26  --  24  --  28  GLUCOSE 116*  --  122*  --  118*  BUN 25*  --  12  --  11  CREATININE 1.18*   < > 0.70 0.81 0.88  CALCIUM 8.3*  --  8.1*  --  8.1*  GFRNONAA 42*   < > >60 >60 60*  ANIONGAP 9  --  11  --  10   < > = values in this interval not displayed.    Recent Labs  Lab 05/01/20 0328 05/06/20 1106  PROT 6.8 5.8*  ALBUMIN 3.5 2.7*  AST 28 60*  ALT 21 24  ALKPHOS 50 49  BILITOT 0.9 0.6   Hematology Recent Labs  Lab 05/04/20 0329 05/06/20 1106 05/07/20 0524  WBC 13.1* 10.1 10.7*  RBC 2.84* 2.68* 2.72*  HGB 8.1* 7.9* 7.9*  HCT 25.3* 24.5* 23.9*  MCV 89.1 91.4 87.9  MCH 28.5 29.5 29.0  MCHC 32.0 32.2 33.1  RDW 12.9 13.5 13.6  PLT 255 290 283   BNP Recent Labs  Lab 05/06/20 1413  BNP 1,221.6*    DDimer No results for input(s): DDIMER in the last 168 hours.   Radiology/Studies:  CT ABDOMEN PELVIS W CONTRAST  Result Date: 05/06/2020 CLINICAL DATA:  Nausea, vomiting. EXAM: CT ABDOMEN AND PELVIS WITH CONTRAST TECHNIQUE: Multidetector CT imaging of the abdomen and pelvis was performed using the standard protocol following bolus administration of intravenous contrast. CONTRAST:  123m OMNIPAQUE IOHEXOL 300 MG/ML  SOLN COMPARISON:  10/10/2016 FINDINGS: Lower chest: Small bilateral pleural effusions. Dependent atelectasis in the lower lobes. Hepatobiliary: 14 mm low-density lesion in the left hepatic lobe is stable since prior study compatible with cyst. Adjacent enhancing lesion most compatible with hemangioma. No suspicious focal hepatic abnormality.  Gallbladder unremarkable. Pancreas: No focal abnormality or ductal dilatation. Spleen: No focal abnormality.  Normal size. Adrenals/Urinary Tract: Previously seen right renal stone has migrated into the right renal pelvis, layering dependently and nonobstructing. This measures 7 mm. No hydronephrosis. Adrenal glands and urinary bladder grossly unremarkable. Gas within the urinary bladder presumably from recent catheterization. Stomach/Bowel: Left colonic diverticulosis. No active diverticulitis. Stomach and small bowel unremarkable. Vascular/Lymphatic: Aortic atherosclerosis. No evidence of aneurysm or adenopathy. Reproductive: Prior hysterectomy.  No adnexal masses. Other: No free fluid or free air. Musculoskeletal: Prior right hip replacement. IMPRESSION: 7 mm stone layering dependently in the right renal pelvis. No obstruction. Small bilateral pleural effusions.  Dependent atelectasis. Left colonic diverticulosis.  No active diverticulitis. Aortic atherosclerosis. Electronically Signed   By: KRolm BaptiseM.D.   On: 05/06/2020 15:20   DG Abd Acute W/Chest  Result Date: 05/06/2020 CLINICAL DATA:  Vomiting, status post right hip total arthroplasty EXAM: X-RAY ABDOMEN 2 VIEW COMPARISON:  None. FINDINGS: There is no evidence of dilated bowel loops or free intraperitoneal air. Right-sided renal calculus. Multiple phleboliths in the pelvis. Mild cardiomegaly. Mild, diffuse bilateral interstitial pulmonary opacity. Status post right hip total arthroplasty. IMPRESSION: 1. Nonobstructive bowel gas pattern. 2. Right-sided nephrolithiasis. 3. Mild, diffuse bilateral interstitial pulmonary opacity, likely edema in the setting of cardiomegaly. No focal airspace opacity. 4. Status post right hip total arthroplasty. Electronically Signed   By: AEddie CandleM.D.   On: 05/06/2020 11:22     Assessment and Plan:   NSTEMI - Patient presented with nausea and vomiting and found to have CHF exacerbation and markedly elevated  troponin concerning for NSTEMI. - High-sensitivity troponin 2,365 >> 3,297 >> 3,515 >> 3,026. - EKG with known LBBB. - Echo pending. - No chest pain. - Started on IV Heparin (without bolus given anemia with hemoglobin of 7.9). - Continue Aspirin.  - Beta-blocker initially not added due to concern for CHF exacerbation. However, suspect this is likely due to MI. Will start Lopressor 227mtwice daily.  - Patient intolerant to statins in the past. Will check lipid panel. May need Zetia or PCSK9 inhibitor as outpatient. - Patient would ideally have ischemic evaluation with cardiac catheterization. However, hemoglobin is 7.9, down from 11.9 on 10/11 and 4.1 on 10/15. Given concern for MI, may need blood transfusion. Will discuss possible cath with MD.  Acute Hypoxic Respiratory Failure Secondary to Acute on Chronic Diastolic CHF - BNP elevated at 1,221. - Chest x-ray showed bilateral interstitial pulmonary opacity felt to be pulmonary edema given cardiomegaly. - Started on IV Lasix 2051mwice daily with 1.3 L of urinary output so far. Renal function stable. - Patient appears euvolemic  on exam so may be able to discontinue IV Lasix soon. - Echo pending.  - Continue to monitor daily weight, strict I/O's, and renal function.  Hypertension - BP mildly elevated at times but well controlled this morning. - Continue home Clonidine. Patient states she has been taking this for years. - Will start Lopressor 28m twice daily.  Normocytic Anemia - Hemoglobin 7.9. Down trending since hip surgery last week. Was 11.9 on 04/30/2020. - Iron mildly low at 24. Ferritin normal. TIBC normal.  - Vitamin B12 and Folate normal. - Patient denies any abnormal bleeding. - Will order hemoccult. - Possible acute blood loss anemia from recent surgery.  - Management per primary team. Discussed with Dr. TGrandville Silos- may need blood transfusion given concern for MI but will discuss with Dr. PJohney Framefirst.  Hypokalemia -  Potassium 3.2 today. - Supplementation ordered by primary team.  Otherwise, per primary team. - Recent femoral neck fracture s/p right total arthroplasty on 05/02/2020 - Nausea/vomiting - Anxiety  TIMI Risk Score for Unstable Angina or Non-ST Elevation MI:   The patient's TIMI risk score is 4, which indicates a 20% risk of all cause mortality, new or recurrent myocardial infarction or need for urgent revascularization in the next 14 days.  New York Heart Association (NYHA) Functional Class NYHA Class I    For questions or updates, please contact [facility] Please consult [email or web address] for contact info under    Signed, CDarreld Mclean PA-C  05/07/2020 9:59 AM   Patient seen and examined and agree with CSande Rives PA as detailed above.  In brief, Ms. SHooperis a 86year old female with history of chronic LBBB, HTN, and recent right femur fracture s/p THA on 05/02/20 presenting with nausea and vomiting found to have elevated troponin 3000s and volume overload concerning for possible ischemic event with resultant HF.  TTE with LVEF 50-55% with hypokinesis of the inferior and inferolateral wall. Concerning for acute ischemic event. Trop peaked at 3515. ECG with chronic LBBB with STD in the inferior leads. Patient with no current chest pain or nausea/vomiting.  Exam: General: Comfortable, eating lunch Resp: Diminished at left lung base; otherwise clear CV: RR, no murmur Abd: Soft, ND Extremities: Warm, no edema Neuro: Non-focal  Plan: -Suspect current presentation is secondary to NSTEMI with regional WMA on TTE, inferior STD on ECG and elevated troponin -[facility] -Will give 1unit pRBC prior to transfer with lasix 240mIV  -Continue lasix 2068mV BID -Continue heparin gtt -Continue ASA 3m28mily -Allergic to statins; will start zetia and may need PSCK9 inhibitor as out-patient -Continue BB -Will need ACE/ARB post-cath  INFORMED  CONSENT: I have reviewed the risks, indications, and alternatives to cardiac catheterization, possible angioplasty, and stenting with the patient. Risks include but are not limited to bleeding, infection, vascular injury, stroke, myocardial infection, arrhythmia, kidney injury, radiation-related injury in the case of prolonged fluoroscopy use, emergency cardiac surgery, and death. The patient understands the risks of serious complication is 1-2 in [id number] diagnostic cardiac cath and 1-2% or less with angioplasty/stenting.    Total time of encounter: 45 minutes total time of encounter, including 30 minutes spent in face-to-face patient care on the date of this encounter. This time includes coordination of care and counseling regarding above mentioned problem list. Remainder of non-face-to-face time involved reviewing chart documents/testing relevant to the patient encounter and documentation in the medical record. I have independently reviewed documentation from referring provider.  Gwyndolyn Kaufman, MD [place], MD

## 2020-05-07 NOTE — Progress Notes (Signed)
PROGRESS NOTE    Patricia Garcia  PYK:[id number] DOB: May 01, 1933 DOA: 05/06/2020 PCP: Lajean Manes, MD    Chief Complaint  Patient presents with  . Emesis    Brief Narrative:  HPI per Dr. Neysa Bonito This is an 86 year old female with a history of hypertension, anxiety, ischemic cardiomyopathy with LBBB, osteopenia, diverticulosis, recent right femur fracture s/p total hip arthroplasty on 05/02/2020 by Dr. Lyla Glassing and discharged on 10/15 who had been recovering at home and doing well until last night when she started having some nausea and vomiting which kept her up most of the night.  No abdominal pain, diarrhea, no chest pain or shortness of breath.  No leg swelling or any other issues.  Of note, she has been having some postnasal drainage for the past month or so.  ED Course: Afebrile, tachycardic, tachypneic, hypoxic requiring 2-3 LPM.  In the ED she was given antiemetics and her nausea and vomiting improved and CT scan did not show any obstruction or other acute process.  She did not have any respiratory complaints but did have a new oxygen requirement.  CXR in the ED suggested pulmonary edema.  EKG and BNP were added on and IV fluids were held.  BNP found to be 1221.  She was given Lasix 20 mg IV x1.  Other notable labs were K-3.3, AST 60, ALT 24, Hb 7.9 (previously 8.1 on 10/15 but baseline is around 12).  Upon my personal assessment the patient was satting 87 to 88% on 2 LPM requiring up to 4 LPM.  Assessment & Plan:   Principal Problem:   Acute respiratory failure with hypoxia ([place]) Active Problems:   Acute exacerbation of CHF (congestive heart failure) (HCC)   NSTEMI (non-ST elevated myocardial infarction) (HCC)   HYPERTENSION, BENIGN   Constipation   GERD (gastroesophageal reflux disease)   Femur fracture, right (HCC)   LBBB (left bundle branch block)   Sinus congestion   Postoperative anemia  1 acute hypoxic respiratory failure secondary to acute diastolic CHF  exacerbation/non-STEMI Patient presented with new O2 requirements of up to 4 L nasal cannula on day of admission.  Patient had presented with nausea and dry heaves.  BNP on admission noted to be elevated at 1221.  Chest x-ray done on admission concerning for pulmonary edema.  Cardiac enzymes were cycled which came back elevated at 2365>>> 3297>>>> 3515.  Patient placed on IV Lasix every 12 hours with a urine output of 1.3 L over the past 24 hours.  2D echo was obtained with EF of 50 to 55%, left ventricular wall motion abnormalities, grade 2 diastolic dysfunction, elevated left atrial pressure, mildly elevated pulmonary artery systolic pressure, moderate pleural effusion in the left lateral region, mild mitral valve regurgitation.  Patient denied any chest pain.  Patient denied any significant shortness of breath.  Patient started on a heparin drip this morning.  Aspirin 81 mg added.  Continue home regimen [place].  Cardiology consulted due to concerns for non-STEMI and acute CHF exacerbation.  Lopressor added to patient's regimen.  Cardiology recommending continuation of IV Lasix, heparin, beta-blocker, transfusion of 1 unit packed red blood cells to keep hemoglobin greater than 8, transferred to [facility] for left heart catheterization to be done tomorrow morning.  Cardiology following and appreciate input and recommendations.  2.  Postop acute blood loss anemia/anemia of chronic disease Patient with recent right hip surgery.  Patient denies any overt bleeding.  Hemoglobin currently at 7.9 from 8.1 on day of discharge.  Panel with iron of 24, TIBC of 239, ferritin of 231, folate of 15.7.  Due to problem #1 cardiology recommending transfusion of 1 unit of packed red blood cells to keep hemoglobin greater than 8.  Follow H&H.  3.  Status post recent right hip total arthroplasty on 10/13 per Dr. San Morelle.  Patient on full dose heparin secondary to problem #1.  Also placed on aspirin.  Continue  pain management.  Bowel regimen.  PT/OT.  Will inform orthopedics of patient's admission.  4.  Sinus congestion with postnasal drip, likely viral Continue Zyrtec.  Placed on Flonase.  Follow.  5.  Nausea vomiting Likely secondary to problem #1.  Supportive care.  6.  Hypertension Continue home regimen clonidine.  Lopressor added to regimen per cardiology.  7.  Anxiety Celexa.  8.  Hypokalemia Secondary to diuresis.  Replete.  9.  GERD PPI.  10.  Constipation Continue Colace.   DVT prophylaxis: Heparin Code Status: Full Family Communication: Updated patient and son at bedside. Disposition:   Status is: Inpatient    Dispo: The patient is from: Home              Anticipated d/c is to: To be determined.  Likely home.              Anticipated d/c date is: 4 to 5 days              Patient currently in acute CHF exacerbation, concern for non-STEMI, being transferred to [facility] for evaluation for possible cardiac catheterization.  Not stable for discharge.       Consultants:   Cardiology: Dr. Johney Frame 05/07/2020  Procedures:   2D echo 05/07/2020  CT abdomen and pelvis 05/06/2020  Acute abdominal series 05/06/2020  Transfuse 1 unit packed red blood cells 05/07/2020  Antimicrobials:   None   Subjective: Patient sitting up in bed.  Denies any chest pain.  Denies any significant shortness of breath.  States improvement with nausea and dry heaves.  Denies any overt bleeding.  Objective: Vitals:   05/06/20 2118 05/06/20 2125 05/07/20 0119 05/07/20 0556  BP: (!) 156/82  (!) 124/54 (!) 123/58  Pulse: (!) 107  87 82  Resp: 20  12 (!) 24  Temp: 98.6 F (37 C)  99.2 F (37.3 C) 99.2 F (37.3 C)  TempSrc:   Oral Oral  SpO2: 98%  97% 99%  Weight:  61.5 kg    Height:  5\' 3"  (1.6 m)      Intake/Output Summary (Last 24 hours) at 05/07/2020 1304 Last data filed at 05/07/2020 0900 Gross per 24 hour  Intake 120 ml  Output 1300 ml  Net -1180 ml    Filed Weights   05/06/20 1417 05/06/20 2125  Weight: 56.7 kg 61.5 kg    Examination:  General exam: Appears calm and comfortable  Respiratory system: Diffuse bibasilar crackles.  No wheezing.  No rhonchi.  Speaking in full sentences.   Cardiovascular system: Regular rate rhythm no murmurs rubs or gallops.  No JVD.  No lower extremity edema.   Gastrointestinal system: Abdomen is nondistended, soft and nontender. No organomegaly or masses felt. Normal bowel sounds heard. Central nervous system: Alert and oriented. No focal neurological deficits. Extremities: Symmetric 5 x 5 power.  Right lower extremity thigh with dressing in place, nontender to palpation, no significant swelling. Skin: No rashes, lesions or ulcers Psychiatry: Judgement and insight appear normal. Mood & affect appropriate.     Data Reviewed: I  have personally reviewed following labs and imaging studies  CBC: Recent Labs  Lab 04/30/20 2021 04/30/20 2021 05/01/20 0328 05/03/20 0348 05/04/20 0329 05/06/20 1106 05/07/20 0524  WBC 14.5*   < > 9.6 14.9* 13.1* 10.1 10.7*  NEUTROABS 12.1*  --   --   --   --  8.2*  --   HGB 11.9*   < > 11.3* 9.9* 8.1* 7.9* 7.9*  HCT 37.1   < > 34.9* 30.8* 25.3* 24.5* 23.9*  MCV 90.0   < > 89.9 89.5 89.1 91.4 87.9  PLT 333   < > 308 217 255 290 283   < > = values in this interval not displayed.    Basic Metabolic Panel: Recent Labs  Lab 05/01/20 0328 05/01/20 0328 05/03/20 0348 05/04/20 0329 05/06/20 1106 05/06/20 1859 05/07/20 0524 05/07/20 0725  NA 139  --  136 137 137  --  138  --   K 3.9  --  4.3 3.9 3.3*  --  3.2*  --   CL 107  --  102 102 102  --  100  --   CO2 24  --  22 26 24   --  28  --   GLUCOSE 130*  --  193* 116* 122*  --  118*  --   BUN 13  --  17 25* 12  --  11  --   CREATININE 0.83   < > 1.09* 1.18* 0.70 0.81 0.88  --   CALCIUM 8.6*  --  8.1* 8.3* 8.1*  --  8.1*  --   MG  --   --   --   --   --   --   --  1.8   < > = values in this interval not  displayed.    GFR: Estimated Creatinine Clearance: 38.7 mL/min (by C-G formula based on SCr of 0.88 mg/dL).  Liver Function Tests: Recent Labs  Lab 05/01/20 0328 05/06/20 1106  AST 28 60*  ALT 21 24  ALKPHOS 50 49  BILITOT 0.9 0.6  PROT 6.8 5.8*  ALBUMIN 3.5 2.7*    CBG: No results for input(s): GLUCAP in the last 168 hours.   Recent Results (from the past 240 hour(s))  Respiratory Panel by RT PCR (Flu A&B, Covid) - Nasopharyngeal Swab     Status: None   Collection Time: 04/30/20  8:21 PM   Specimen: Nasopharyngeal Swab  Result Value Ref Range Status   SARS Coronavirus 2 by RT PCR NEGATIVE NEGATIVE Final    Comment: (NOTE) SARS-CoV-2 target nucleic acids are NOT DETECTED.  The SARS-CoV-2 RNA is generally detectable in upper respiratoy specimens during the acute phase of infection. The lowest concentration of SARS-CoV-2 viral copies this assay can detect is 131 copies/mL. A negative result does not preclude SARS-Cov-2 infection and should not be used as the sole basis for treatment or other patient management decisions. A negative result may occur with  improper specimen collection/handling, submission of specimen other than nasopharyngeal swab, presence of viral mutation(s) within the areas targeted by this assay, and inadequate number of viral copies (<131 copies/mL). A negative result must be combined with clinical observations, patient history, and epidemiological information. The expected result is Negative.  Fact Sheet for Patients:  PinkCheek.be  Fact Sheet for Healthcare Providers:  GravelBags.it  This test is no t yet approved or cleared by the [place] FDA and  has been authorized for detection and/or diagnosis of SARS-CoV-2 by FDA under an Emergency  Use Authorization (EUA). This EUA will remain  in effect (meaning this test can be used) for the duration of the COVID-19 declaration under  Section 564(b)(1) of the Act, 21 U.S.C. section 360bbb-3(b)(1), unless the authorization is terminated or revoked sooner.     Influenza A by PCR NEGATIVE NEGATIVE Final   Influenza B by PCR NEGATIVE NEGATIVE Final    Comment: (NOTE) The Xpert Xpress SARS-CoV-2/FLU/RSV assay is intended as an aid in  the diagnosis of influenza from Nasopharyngeal swab specimens and  should not be used as a sole basis for treatment. Nasal washings and  aspirates are unacceptable for Xpert Xpress SARS-CoV-2/FLU/RSV  testing.  Fact Sheet for Patients: PinkCheek.be  Fact Sheet for Healthcare Providers: GravelBags.it  This test is not yet approved or cleared by the [place] FDA and  has been authorized for detection and/or diagnosis of SARS-CoV-2 by  FDA under an Emergency Use Authorization (EUA). This EUA will remain  in effect (meaning this test can be used) for the duration of the  Covid-19 declaration under Section 564(b)(1) of the Act, 21  U.S.C. section 360bbb-3(b)(1), unless the authorization is  terminated or revoked. Performed at [facility], [place] [address], [place], [place] [address]   Surgical PCR screen     Status: Abnormal   Collection Time: 05/01/20  1:15 AM   Specimen: Nasal Mucosa; Nasal Swab  Result Value Ref Range Status   MRSA, PCR NEGATIVE NEGATIVE Final   Staphylococcus aureus POSITIVE (A) NEGATIVE Final    Comment: (NOTE) The Xpert SA Assay (FDA approved for NASAL specimens in patients 7 years of age and older), is one component of a comprehensive surveillance program. It is not intended to diagnose infection nor to guide or monitor treatment. Performed at [facility], [place] [address], [place], [place] [address]   Respiratory Panel by RT PCR (Flu A&B, Covid) - Nasopharyngeal Swab     Status: None   Collection Time: 05/06/20 11:06 AM   Specimen: Nasopharyngeal Swab   Result Value Ref Range Status   SARS Coronavirus 2 by RT PCR NEGATIVE NEGATIVE Final    Comment: (NOTE) SARS-CoV-2 target nucleic acids are NOT DETECTED.  The SARS-CoV-2 RNA is generally detectable in upper respiratoy specimens during the acute phase of infection. The lowest concentration of SARS-CoV-2 viral copies this assay can detect is 131 copies/mL. A negative result does not preclude SARS-Cov-2 infection and should not be used as the sole basis for treatment or other patient management decisions. A negative result may occur with  improper specimen collection/handling, submission of specimen other than nasopharyngeal swab, presence of viral mutation(s) within the areas targeted by this assay, and inadequate number of viral copies (<131 copies/mL). A negative result must be combined with clinical observations, patient history, and epidemiological information. The expected result is Negative.  Fact Sheet for Patients:  PinkCheek.be  Fact Sheet for Healthcare Providers:  GravelBags.it  This test is no t yet approved or cleared by the [place] FDA and  has been authorized for detection and/or diagnosis of SARS-CoV-2 by FDA under an Emergency Use Authorization (EUA). This EUA will remain  in effect (meaning this test can be used) for the duration of the COVID-19 declaration under Section 564(b)(1) of the Act, 21 U.S.C. section 360bbb-3(b)(1), unless the authorization is terminated or revoked sooner.     Influenza A by PCR NEGATIVE NEGATIVE Final   Influenza B by PCR NEGATIVE NEGATIVE Final    Comment: (NOTE) The Xpert  Xpress SARS-CoV-2/FLU/RSV assay is intended as an aid in  the diagnosis of influenza from Nasopharyngeal swab specimens and  should not be used as a sole basis for treatment. Nasal washings and  aspirates are unacceptable for Xpert Xpress SARS-CoV-2/FLU/RSV  testing.  Fact Sheet for  Patients: PinkCheek.be  Fact Sheet for Healthcare Providers: GravelBags.it  This test is not yet approved or cleared by the [place] FDA and  has been authorized for detection and/or diagnosis of SARS-CoV-2 by  FDA under an Emergency Use Authorization (EUA). This EUA will remain  in effect (meaning this test can be used) for the duration of the  Covid-19 declaration under Section 564(b)(1) of the Act, 21  U.S.C. section 360bbb-3(b)(1), unless the authorization is  terminated or revoked. Performed at [facility], [place] [address] [place], [place] [address]          Radiology Studies: CT ABDOMEN PELVIS W CONTRAST  Result Date: 05/06/2020 CLINICAL DATA:  Nausea, vomiting. EXAM: CT ABDOMEN AND PELVIS WITH CONTRAST TECHNIQUE: Multidetector CT imaging of the abdomen and pelvis was performed using the standard protocol following bolus administration of intravenous contrast. CONTRAST:  116mL OMNIPAQUE IOHEXOL 300 MG/ML  SOLN COMPARISON:  10/10/2016 FINDINGS: Lower chest: Small bilateral pleural effusions. Dependent atelectasis in the lower lobes. Hepatobiliary: 14 mm low-density lesion in the left hepatic lobe is stable since prior study compatible with cyst. Adjacent enhancing lesion most compatible with hemangioma. No suspicious focal hepatic abnormality. Gallbladder unremarkable. Pancreas: No focal abnormality or ductal dilatation. Spleen: No focal abnormality.  Normal size. Adrenals/Urinary Tract: Previously seen right renal stone has migrated into the right renal pelvis, layering dependently and nonobstructing. This measures 7 mm. No hydronephrosis. Adrenal glands and urinary bladder grossly unremarkable. Gas within the urinary bladder presumably from recent catheterization. Stomach/Bowel: Left colonic diverticulosis. No active diverticulitis. Stomach and small bowel unremarkable. Vascular/Lymphatic: Aortic  atherosclerosis. No evidence of aneurysm or adenopathy. Reproductive: Prior hysterectomy.  No adnexal masses. Other: No free fluid or free air. Musculoskeletal: Prior right hip replacement. IMPRESSION: 7 mm stone layering dependently in the right renal pelvis. No obstruction. Small bilateral pleural effusions.  Dependent atelectasis. Left colonic diverticulosis.  No active diverticulitis. Aortic atherosclerosis. Electronically Signed   By: Rolm Baptise M.D.   On: 05/06/2020 15:20   DG Abd Acute W/Chest  Result Date: 05/06/2020 CLINICAL DATA:  Vomiting, status post right hip total arthroplasty EXAM: X-RAY ABDOMEN 2 VIEW COMPARISON:  None. FINDINGS: There is no evidence of dilated bowel loops or free intraperitoneal air. Right-sided renal calculus. Multiple phleboliths in the pelvis. Mild cardiomegaly. Mild, diffuse bilateral interstitial pulmonary opacity. Status post right hip total arthroplasty. IMPRESSION: 1. Nonobstructive bowel gas pattern. 2. Right-sided nephrolithiasis. 3. Mild, diffuse bilateral interstitial pulmonary opacity, likely edema in the setting of cardiomegaly. No focal airspace opacity. 4. Status post right hip total arthroplasty. Electronically Signed   By: Eddie Candle M.D.   On: 05/06/2020 11:22   ECHOCARDIOGRAM COMPLETE  Result Date: 05/07/2020    ECHOCARDIOGRAM REPORT   Patient Name:   Patricia Garcia Date of Exam: 05/07/2020 Medical Rec #:  [id number]        Height:       63.0 in Accession #:    [id number]       Weight:       135.6 lb Date of Birth:  07-11-1933        BSA:          1.639 m Patient Age:  87 years         BP:           123/58 mmHg Patient Gender: F                HR:           82 bpm. Exam Location:  Inpatient Procedure: 2D Echo, Cardiac Doppler and Color Doppler Indications:    Dyspnea  History:        Patient has no prior history of Echocardiogram examinations.                 Cardiomyopathy, Arrythmias:LBBB, Signs/Symptoms:Dyspnea and                 Pleurall  effusion; Risk Factors:Hypertension. Elevated Troponin,                 s/p hip replacement.  Sonographer:    Dustin Flock Referring Phys: [id number] Brinson  1. Hypokinesis of the basal inferior and inferolateral wall with overall preserved LV function.  2. Left ventricular ejection fraction, by estimation, is 50 to 55%. The left ventricle has low normal function. The left ventricle demonstrates regional wall motion abnormalities (see scoring diagram/findings for description). Left ventricular diastolic  parameters are consistent with Grade II diastolic dysfunction (pseudonormalization). Elevated left atrial pressure.  3. Right ventricular systolic function is normal. The right ventricular size is normal. There is mildly elevated pulmonary artery systolic pressure.  4. Moderate pleural effusion in the left lateral region.  5. The mitral valve is normal in structure. Mild mitral valve regurgitation. No evidence of mitral stenosis.  6. The aortic valve is tricuspid. Aortic valve regurgitation is trivial. No aortic stenosis is present.  7. The inferior vena cava is normal in size with greater than 50% respiratory variability, suggesting right atrial pressure of 3 mmHg. FINDINGS  Left Ventricle: Left ventricular ejection fraction, by estimation, is 50 to 55%. The left ventricle has low normal function. The left ventricle demonstrates regional wall motion abnormalities. The left ventricular internal cavity size was normal in size. There is no left ventricular hypertrophy. Left ventricular diastolic parameters are consistent with Grade II diastolic dysfunction (pseudonormalization). Elevated left atrial pressure. Right Ventricle: The right ventricular size is normal.Right ventricular systolic function is normal. There is mildly elevated pulmonary artery systolic pressure. The tricuspid regurgitant velocity is 3.08 m/s, and with an assumed right atrial pressure of  3 mmHg, the estimated right  ventricular systolic pressure is 49.4 mmHg. Left Atrium: Left atrial size was normal in size. Right Atrium: Right atrial size was normal in size. Pericardium: There is no evidence of pericardial effusion. Mitral Valve: The mitral valve is normal in structure. Mild mitral annular calcification. Mild mitral valve regurgitation. No evidence of mitral valve stenosis. Tricuspid Valve: The tricuspid valve is normal in structure. Tricuspid valve regurgitation is mild . No evidence of tricuspid stenosis. Aortic Valve: The aortic valve is tricuspid. Aortic valve regurgitation is trivial. No aortic stenosis is present. Pulmonic Valve: The pulmonic valve was normal in structure. Pulmonic valve regurgitation is trivial. No evidence of pulmonic stenosis. Aorta: The aortic root is normal in size and structure. Venous: The inferior vena cava is normal in size with greater than 50% respiratory variability, suggesting right atrial pressure of 3 mmHg.  Additional Comments: Hypokinesis of the basal inferior and inferolateral wall with overall preserved LV function. There is a moderate pleural effusion in the left lateral region.  LEFT VENTRICLE PLAX 2D LVIDd:  4.70 cm  Diastology LVIDs:         3.30 cm  LV e' medial:    5.77 cm/s LV PW:         1.10 cm  LV E/e' medial:  18.2 LV IVS:        1.10 cm  LV e' lateral:   8.70 cm/s LVOT diam:     2.00 cm  LV E/e' lateral: 12.1 LV SV:         56 LV SV Index:   34 LVOT Area:     3.14 cm  RIGHT VENTRICLE RV Basal diam:  3.20 cm RV S prime:     8.16 cm/s TAPSE (M-mode): 2.1 cm LEFT ATRIUM             Index       RIGHT ATRIUM           Index LA diam:        4.00 cm 2.44 cm/m  RA Area:     13.20 cm LA Vol (A2C):   32.6 ml 19.89 ml/m RA Volume:   31.10 ml  18.97 ml/m LA Vol (A4C):   53.9 ml 32.88 ml/m LA Biplane Vol: 42.2 ml 25.74 ml/m  AORTIC VALVE LVOT Vmax:   100.00 cm/s LVOT Vmean:  76.800 cm/s LVOT VTI:    0.178 m  AORTA Ao Root diam: 2.40 cm MITRAL VALVE                TRICUSPID  VALVE MV Area (PHT): 5.75 cm     TR Peak grad:   37.9 mmHg MV Decel Time: 132 msec     TR Vmax:        308.00 cm/s MV E velocity: 105.00 cm/s MV A velocity: 96.00 cm/s   SHUNTS MV E/A ratio:  1.09         Systemic VTI:  0.18 m                             Systemic Diam: 2.00 cm Kirk Ruths MD Electronically signed by Kirk Ruths MD Signature Date/Time: 05/07/2020/12:45:14 PM    Final         Scheduled Meds: . sodium chloride   Intravenous Once  . acetaminophen  650 mg Oral Once  . aspirin  81 mg Oral Daily  . cholecalciferol  1,000 Units Oral Daily  . citalopram  20 mg Oral Daily  . cloNIDine  0.1 mg Oral BID  . diphenhydrAMINE  25 mg Oral Once  . docusate sodium  100 mg Oral BID  . ezetimibe  10 mg Oral Daily  . famotidine  40 mg Oral Daily  . ferrous sulfate  325 mg Oral Once per day on Mon Wed Fri  . furosemide  20 mg Intravenous BID  . furosemide  20 mg Intravenous Once  . latanoprost  1 drop Both Eyes QHS  . loratadine  10 mg Oral Daily  . metoprolol tartrate  25 mg Oral BID  . multivitamin with minerals  1 tablet Oral Daily  . omega-3 acid ethyl esters  1,000 mg Oral Daily  . potassium chloride  40 mEq Oral Q4H  . sodium chloride flush  3 mL Intravenous Q12H   Continuous Infusions: . heparin 750 Units/hr (05/07/20 1104)     LOS: 1 day    Time spent: 40 minutes    Irine Seal, MD [facility]   To contact the attending  provider between 7A-7P or the covering provider during after hours 7P-7A, please log into the web site [email or web address] and access using universal [place] password for that web site. If you do not have the password, please call the hospital operator.  05/07/2020, 1:04 PM

## 2020-05-07 NOTE — Consult Note (Signed)
   [facility] CM Inpatient Consult   05/07/2020  Patricia Garcia 1933/05/04 [id number]   Patient chart has been reviewed for readmissions less than 30 days. Patient assessed for community [place] Management follow up needs.    Will continue to follow for progression and disposition plans.  Of note, [facility] Care Management services does not replace or interfere with any services that are arranged by inpatient case management or social work.  Netta Cedars, MSN, [facility] Liaison Nurse Mobile Phone [phone]  Toll free office [phone]

## 2020-05-07 NOTE — Consult Note (Addendum)
Cardiology Consultation:   Patient ID: Patricia Garcia MRN: [id number]; DOB: July 12, 1933  Admit date: 05/06/2020 Date of Consult: 05/07/2020  Primary Care Provider: Lajean Manes, [facility] Cardiologist: New (Dr. Johney Frame) [place] Electrophysiologist:  None    Patient Profile:   Patricia Garcia is a 86 y.o. female with a history of LBBB, hypertension, diverticulosis, arthritis, depression,a and recent right femur fracture s/p total hip arthroplasty on 05/02/2020 who is being seen today for the evaluation of CHF and markedly elevated troponin at the request of Dr. Grandville Silos.  History of Present Illness:   Patricia Garcia is a 86 year old female with the above history. She history of ischemic cardiomyopathy listed in her chart but patient is not aware of this. She has never seen a Film/video editor. Her PCP did order a Lexiscan Myoview in 08/2019 which was low risk with no evidence of ischemia. EF was 46% with abnormal septal motion and apical hypokinesis. Patient cannot remember why this test was ordered. She denies any chest pain at the time.   Patient was admitted from 04/30/2020 to 05/04/2020 for right femoral neck fracture after a mechanical fall. She underwent total right hip arthroplasty on 05/02/2020 by Dr. Lyla Glassing. She tolerated procedure well and was discharged with home PT.  Patient returned to [facility] ED on 05/06/2020 with complaints of nausea/vomiting. She was noted to be hypoxic requiring supplemental O2 via nasal cannula.  In the ED, O2 as low as 84% on room air but improved to mid 90's with nasal cannula. EKG showed sinus tachycardia vs ectopic atrial tachycardia with known LBBB. Chest x-ray showed mild, diffuse bilateral interstitial pulmonary opacity felt to be pulmonary edema given cardiomegaly. Abdominal x-ray showed right sided nephrolithiasis and non-obstructive bowel gas pattern. Abdominal/pelvic CT showed 16m stone in the right renal pelvis but no  obstruction. BNP elevated at 1,221. WBC 10.1, Hgb 7.9, Plts 290. Na 147, K 3.3, Glucose 122, BUN 12, Cr 0.70. Albumin 2.7, AST 60, ALT 24, Alk Phos 49, Total Bili 0.6. Lipase normal. Respiratory panel negative for COVID and Influenza. Patient was started on IV Lasix and Echo and troponin were ordered. High-sensitivity troponin came back markedly elevated at 2,365 >> 3,297 >> 3,515 >> 3,026. Patient was started on IV Heparin (without bolus given anemia) and Cardiology consulted.  At the time of this evaluation, patient resting comfortably in no acute distress. She states she was doing well since being discharged following hip surgery until yesterday morning when she just did not feel right. She states she started to feel very nauseous around 4am yesterday with dry heaving and at least one episode of vomiting. Patient notes some mild shortness of breath with this and states she was breathing very quickly prior to vomiting. No chest pain. No palpitations, lightheadedness, dizziness. No orthopnea, PND, or edema. No fevers or recent illnesses. She does note some post-nasal drip which she thinks is likely from allergies. No abnormal bleeding in urine or stools. She denies any hip pain.  Patient has remote smoking history but states she quit in the 1980's. She does have a family history of heart disease. She thinks her father had CHF. He died in his 735'sfrom cancer. She also reports that her mother had heart problems but could not specify.   Past Medical History:  Diagnosis Date  . Anal or rectal pain   . Arthritis   . Depression   . Diverticulosis of colon (without mention of hemorrhage)   . Hypertension   .  Internal hemorrhoids without mention of complication   . Ischemic heart disease   . Osteopenia   . Slow transit constipation     Past Surgical History:  Procedure Laterality Date  . ABDOMINAL HYSTERECTOMY    . CATARACT EXTRACTION, BILATERAL    . MASS EXCISION  06/10/2011   Procedure: EXCISION  MASS;  Surgeon: Wynonia Sours, MD;  Location: [facility];  Service: Orthopedics;  Laterality: Right;  excision cyst right thumb IP, debridement IP right thumb  . TONSILLECTOMY    . TOTAL HIP ARTHROPLASTY Right 05/02/2020   Procedure: TOTAL HIP ARTHROPLASTY ANTERIOR APPROACH;  Surgeon: Rod Can, MD;  Location: [facility];  Service: Orthopedics;  Laterality: Right;     Home Medications:  Prior to Admission medications   Medication Sig Start Date End Date Taking? Authorizing Provider  ALPRAZolam (XANAX) 0.25 MG tablet Take 0.025 mg by mouth at bedtime as needed for anxiety.    Yes [provider]  aspirin (ASPIRIN CHILDRENS) 81 MG chewable tablet Chew 1 tablet (81 mg total) by mouth 2 (two) times daily with a meal. 05/03/20 06/17/20 Yes McCauley, Casimer Leek, PA  Calcium Carbonate-Vit D-Min (CALCIUM 1200 PO) Take 1,200 mg by mouth daily.   Yes [provider]  cholecalciferol (VITAMIN D) 1000 UNITS tablet Take 1,000 Units by mouth daily.     Yes [provider]  citalopram (CELEXA) 20 MG tablet Take 20 mg by mouth daily.     Yes [provider]  cloNIDine (CATAPRES) 0.1 MG tablet Take 0.1 mg by mouth daily.    Yes [provider]  docusate sodium (COLACE) 100 MG capsule Take 1 capsule (100 mg total) by mouth 2 (two) times daily. 05/04/20  Yes Oswald Hillock, MD  famotidine (PEPCID) 40 MG tablet Take 40 mg by mouth daily.   Yes [provider]  ferrous sulfate 325 (65 FE) MG tablet Take 325 mg by mouth See admin instructions. 3 times a week   Yes [provider]  HYDROcodone-acetaminophen (NORCO/VICODIN) 5-325 MG tablet Take 1-2 tablets by mouth every 4 (four) hours as needed for up to 7 days for moderate pain (pain score 4-6). 05/03/20 05/10/20 Yes McCauley, Fritz Pickerel B, PA  latanoprost (XALATAN) 0.005 % ophthalmic solution Place 1 drop into both eyes at bedtime.   Yes [provider]  multivitamin-iron-minerals-folic  acid (CENTRUM) chewable tablet Chew 1 tablet by mouth daily.     Yes [provider]  Naphazoline-Pheniramine (OPCON-A OP) Place 1 drop into both eyes daily as needed (for eye allergy).   Yes [provider]  OLIVE LEAF EXTRACT PO Take 1 tablet by mouth daily.   Yes [provider]  Omega-3 1000 MG CAPS Take 1,000 mg by mouth daily.   Yes [provider]  senna (SENOKOT) 8.6 MG TABS tablet Take 1 tablet (8.6 mg total) by mouth daily as needed for mild constipation. 05/04/20  Yes Oswald Hillock, MD  sodium chloride (MURO 128) 2 % ophthalmic solution Place 1 drop into both eyes 2 (two) times daily as needed for eye irritation.   Yes [provider]  omeprazole (PRILOSEC) 20 MG capsule Take 1 capsule (20 mg total) by mouth daily. Patient not taking: Reported on 04/30/2020 03/24/12   Sable Feil, MD    Inpatient Medications: Scheduled Meds: . aspirin  81 mg Oral Daily  . cholecalciferol  1,000 Units Oral Daily  . citalopram  20 mg Oral Daily  . cloNIDine  0.1  mg Oral BID  . docusate sodium  100 mg Oral BID  . famotidine  40 mg Oral Daily  . ferrous sulfate  325 mg Oral Once per day on Mon Wed Fri  . furosemide  20 mg Intravenous BID  . latanoprost  1 drop Both Eyes QHS  . loratadine  10 mg Oral Daily  . multivitamin with minerals  1 tablet Oral Daily  . omega-3 acid ethyl esters  1,000 mg Oral Daily  . potassium chloride  40 mEq Oral Q4H  . sodium chloride flush  3 mL Intravenous Q12H   Continuous Infusions: . heparin    . magnesium sulfate bolus IVPB 2 g (05/07/20 0912)   PRN Meds: acetaminophen **OR** acetaminophen, HYDROcodone-acetaminophen, metoprolol tartrate, senna, sodium chloride  Allergies:    Allergies  Allergen Reactions  . Penicillins Other (See Comments)    unknown  . Prednisone Other (See Comments)    unknown  . Statins Other (See Comments)    shaking  . Sulfonamide Derivatives Other (See Comments)    unknown  .  Tramadol Other (See Comments)    unknown    Social History:   Social History   Socioeconomic History  . Marital status: Widowed    Spouse name: Not on file  . Number of children: 2  . Years of education: Not on file  . Highest education level: Not on file  Occupational History  . Occupation: Retired    Fish farm manager: RETIRED  Tobacco Use  . Smoking status: Former Smoker    Quit date: 06/05/1984    Years since quitting: 35.9  . Smokeless tobacco: Never Used  Substance and Sexual Activity  . Alcohol use: No  . Drug use: No  . Sexual activity: Not on file  Other Topics Concern  . Not on file  Social History Narrative  . Not on file   Social Determinants of Health   Financial Resource Strain:   . Difficulty of Paying Living Expenses: Not on file  Food Insecurity:   . Worried About Charity fundraiser in the Last Year: Not on file  . Ran Out of Food in the Last Year: Not on file  Transportation Needs:   . Lack of Transportation (Medical): Not on file  . Lack of Transportation (Non-Medical): Not on file  Physical Activity:   . Days of Exercise per Week: Not on file  . Minutes of Exercise per Session: Not on file  Stress:   . Feeling of Stress : Not on file  Social Connections:   . Frequency of Communication with Friends and Family: Not on file  . Frequency of Social Gatherings with Friends and Family: Not on file  . Attends Religious Services: Not on file  . Active Member of Clubs or Organizations: Not on file  . Attends Archivist Meetings: Not on file  . Marital Status: Not on file  Intimate Partner Violence:   . Fear of Current or Ex-Partner: Not on file  . Emotionally Abused: Not on file  . Physically Abused: Not on file  . Sexually Abused: Not on file    Family History:    Family History  Problem Relation Age of Onset  . Colon polyps Mother   . Cancer Father        bladder  . Heart disease Father      ROS:  Please see the history of present  illness.  All other ROS reviewed and negative.     Physical Exam/Data:  Vitals:   05/06/20 2118 05/06/20 2125 05/07/20 0119 05/07/20 0556  BP: (!) 156/82  (!) 124/54 (!) 123/58  Pulse: (!) 107  87 82  Resp: 20  12 (!) 24  Temp: 98.6 F (37 C)  99.2 F (37.3 C) 99.2 F (37.3 C)  TempSrc:   Oral Oral  SpO2: 98%  97% 99%  Weight:  61.5 kg    Height:  _0  (1.6 m)      Intake/Output Summary (Last 24 hours) at 05/07/2020 0959 Last data filed at 05/07/2020 0900 Gross per 24 hour  Intake 120 ml  Output 1300 ml  Net -1180 ml   Last 3 Weights 05/06/2020 05/06/2020 05/01/2020  Weight (lbs) 135 lb 9.3 oz 125 lb 126 lb 4.8 oz  Weight (kg) 61.5 kg 56.7 kg 57.289 kg     Body mass index is 24.02 kg/m.  General: 86 y.o. female resting comfortably in no acute distress. HEENT: Normocephalic and atraumatic. Sclera clear. Neck: Supple. No JVD. Heart: RRR. Distinct S1 and S2. No murmurs, gallops, or rubs. Radial and distal pedal pulses 2+ and equal bilaterally. Lungs: No increased work of breathing. Clear to ausculation bilaterally. No wheezes, rhonchi, or rales.  Abdomen: Soft, non-distended, and non-tender to palpation. Bowel sounds present. Extremities: No lower extremity edema.    Skin: Warm and dry. Neuro: Alert and oriented x3. No focal deficits. Psych: Normal affect. Responds appropriately.  EKG:  The EKG was personally reviewed and demonstrates:  Normal sinus rhythm with 1st degree AV block and known LBBB.  Telemetry:  Telemetry was personally reviewed and demonstrates: Normal sinus rhythm with rates mostly in the 80's to 90's and brief spikes in the low 100's.   Relevant CV Studies:  Lexican Myoview 08/29/2019:  Nuclear stress EF: 46%.  There was no ST segment deviation noted during stress.  This is a low risk study.  The left ventricular ejection fraction is mildly decreased (45-54%).   No ischemia or infarct ECG with LBBB EF estimated at 46% abnormal septal motion  and apical hypokinesis Suggest echo correlation   Laboratory Data:  High Sensitivity Troponin:   Recent Labs  Lab 05/06/20 1859 05/06/20 2148 05/07/20 0524 05/07/20 0725  TROPONINIHS 2,365* 3,297* 3,515* 3,026*     Chemistry Recent Labs  Lab 05/04/20 0329 05/04/20 0329 05/06/20 1106 05/06/20 1859 05/07/20 0524  NA 137  --  137  --  138  K 3.9  --  3.3*  --  3.2*  CL 102  --  102  --  100  CO2 26  --  24  --  28  GLUCOSE 116*  --  122*  --  118*  BUN 25*  --  12  --  11  CREATININE 1.18*   < > 0.70 0.81 0.88  CALCIUM 8.3*  --  8.1*  --  8.1*  GFRNONAA 42*   < > >60 >60 60*  ANIONGAP 9  --  11  --  10   < > = values in this interval not displayed.    Recent Labs  Lab 05/01/20 0328 05/06/20 1106  PROT 6.8 5.8*  ALBUMIN 3.5 2.7*  AST 28 60*  ALT 21 24  ALKPHOS 50 49  BILITOT 0.9 0.6   Hematology Recent Labs  Lab 05/04/20 0329 05/06/20 1106 05/07/20 0524  WBC 13.1* 10.1 10.7*  RBC 2.84* 2.68* 2.72*  HGB 8.1* 7.9* 7.9*  HCT 25.3* 24.5* 23.9*  MCV 89.1 91.4 87.9  MCH 28.5 29.5 29.0  MCHC 32.0 32.2 33.1  RDW 12.9 13.5 13.6  PLT 255 290 283   BNP Recent Labs  Lab 05/06/20 1413  BNP 1,221.6*    DDimer No results for input(s): DDIMER in the last 168 hours.   Radiology/Studies:  CT ABDOMEN PELVIS W CONTRAST  Result Date: 05/06/2020 CLINICAL DATA:  Nausea, vomiting. EXAM: CT ABDOMEN AND PELVIS WITH CONTRAST TECHNIQUE: Multidetector CT imaging of the abdomen and pelvis was performed using the standard protocol following bolus administration of intravenous contrast. CONTRAST:  123m OMNIPAQUE IOHEXOL 300 MG/ML  SOLN COMPARISON:  10/10/2016 FINDINGS: Lower chest: Small bilateral pleural effusions. Dependent atelectasis in the lower lobes. Hepatobiliary: 14 mm low-density lesion in the left hepatic lobe is stable since prior study compatible with cyst. Adjacent enhancing lesion most compatible with hemangioma. No suspicious focal hepatic abnormality.  Gallbladder unremarkable. Pancreas: No focal abnormality or ductal dilatation. Spleen: No focal abnormality.  Normal size. Adrenals/Urinary Tract: Previously seen right renal stone has migrated into the right renal pelvis, layering dependently and nonobstructing. This measures 7 mm. No hydronephrosis. Adrenal glands and urinary bladder grossly unremarkable. Gas within the urinary bladder presumably from recent catheterization. Stomach/Bowel: Left colonic diverticulosis. No active diverticulitis. Stomach and small bowel unremarkable. Vascular/Lymphatic: Aortic atherosclerosis. No evidence of aneurysm or adenopathy. Reproductive: Prior hysterectomy.  No adnexal masses. Other: No free fluid or free air. Musculoskeletal: Prior right hip replacement. IMPRESSION: 7 mm stone layering dependently in the right renal pelvis. No obstruction. Small bilateral pleural effusions.  Dependent atelectasis. Left colonic diverticulosis.  No active diverticulitis. Aortic atherosclerosis. Electronically Signed   By: KRolm BaptiseM.D.   On: 05/06/2020 15:20   DG Abd Acute W/Chest  Result Date: 05/06/2020 CLINICAL DATA:  Vomiting, status post right hip total arthroplasty EXAM: X-RAY ABDOMEN 2 VIEW COMPARISON:  None. FINDINGS: There is no evidence of dilated bowel loops or free intraperitoneal air. Right-sided renal calculus. Multiple phleboliths in the pelvis. Mild cardiomegaly. Mild, diffuse bilateral interstitial pulmonary opacity. Status post right hip total arthroplasty. IMPRESSION: 1. Nonobstructive bowel gas pattern. 2. Right-sided nephrolithiasis. 3. Mild, diffuse bilateral interstitial pulmonary opacity, likely edema in the setting of cardiomegaly. No focal airspace opacity. 4. Status post right hip total arthroplasty. Electronically Signed   By: AEddie CandleM.D.   On: 05/06/2020 11:22     Assessment and Plan:   NSTEMI - Patient presented with nausea and vomiting and found to have CHF exacerbation and markedly elevated  troponin concerning for NSTEMI. - High-sensitivity troponin 2,365 >> 3,297 >> 3,515 >> 3,026. - EKG with known LBBB. - Echo pending. - No chest pain. - Started on IV Heparin (without bolus given anemia with hemoglobin of 7.9). - Continue Aspirin.  - Beta-blocker initially not added due to concern for CHF exacerbation. However, suspect this is likely due to MI. Will start Lopressor 227mtwice daily.  - Patient intolerant to statins in the past. Will check lipid panel. May need Zetia or PCSK9 inhibitor as outpatient. - Patient would ideally have ischemic evaluation with cardiac catheterization. However, hemoglobin is 7.9, down from 11.9 on 10/11 and 4.1 on 10/15. Given concern for MI, may need blood transfusion. Will discuss possible cath with MD.  Acute Hypoxic Respiratory Failure Secondary to Acute on Chronic Diastolic CHF - BNP elevated at 1,221. - Chest x-ray showed bilateral interstitial pulmonary opacity felt to be pulmonary edema given cardiomegaly. - Started on IV Lasix 2051mwice daily with 1.3 L of urinary output so far. Renal function stable. - Patient appears euvolemic  on exam so may be able to discontinue IV Lasix soon. - Echo pending.  - Continue to monitor daily weight, strict I/O's, and renal function.  Hypertension - BP mildly elevated at times but well controlled this morning. - Continue home Clonidine. Patient states she has been taking this for years. - Will start Lopressor 28m twice daily.  Normocytic Anemia - Hemoglobin 7.9. Down trending since hip surgery last week. Was 11.9 on 04/30/2020. - Iron mildly low at 24. Ferritin normal. TIBC normal.  - Vitamin B12 and Folate normal. - Patient denies any abnormal bleeding. - Will order hemoccult. - Possible acute blood loss anemia from recent surgery.  - Management per primary team. Discussed with Dr. TGrandville Silos- may need blood transfusion given concern for MI but will discuss with Dr. PJohney Framefirst.  Hypokalemia -  Potassium 3.2 today. - Supplementation ordered by primary team.  Otherwise, per primary team. - Recent femoral neck fracture s/p right total arthroplasty on 05/02/2020 - Nausea/vomiting - Anxiety  TIMI Risk Score for Unstable Angina or Non-ST Elevation MI:   The patient's TIMI risk score is 4, which indicates a 20% risk of all cause mortality, new or recurrent myocardial infarction or need for urgent revascularization in the next 14 days.  New York Heart Association (NYHA) Functional Class NYHA Class I    For questions or updates, please contact [facility] Please consult [email or web address] for contact info under    Signed, CDarreld Mclean PA-C  05/07/2020 9:59 AM   Patient seen and examined and agree with CSande Rives PA as detailed above.  In brief, Ms. SHooperis a 86year old female with history of chronic LBBB, HTN, and recent right femur fracture s/p THA on 05/02/20 presenting with nausea and vomiting found to have elevated troponin 3000s and volume overload concerning for possible ischemic event with resultant HF.  TTE with LVEF 50-55% with hypokinesis of the inferior and inferolateral wall. Concerning for acute ischemic event. Trop peaked at 3515. ECG with chronic LBBB with STD in the inferior leads. Patient with no current chest pain or nausea/vomiting.  Exam: General: Comfortable, eating lunch Resp: Diminished at left lung base; otherwise clear CV: RR, no murmur Abd: Soft, ND Extremities: Warm, no edema Neuro: Non-focal  Plan: -Suspect current presentation is secondary to NSTEMI with regional WMA on TTE, inferior STD on ECG and elevated troponin -[facility] -Will give 1unit pRBC prior to transfer with lasix 240mIV  -Continue lasix 2068mV BID -Continue heparin gtt -Continue ASA 3m28mily -Allergic to statins; will start zetia and may need PSCK9 inhibitor as out-patient -Continue BB -Will need ACE/ARB post-cath  INFORMED  CONSENT: I have reviewed the risks, indications, and alternatives to cardiac catheterization, possible angioplasty, and stenting with the patient. Risks include but are not limited to bleeding, infection, vascular injury, stroke, myocardial infection, arrhythmia, kidney injury, radiation-related injury in the case of prolonged fluoroscopy use, emergency cardiac surgery, and death. The patient understands the risks of serious complication is 1-2 in [id number] diagnostic cardiac cath and 1-2% or less with angioplasty/stenting.    Total time of encounter: 45 minutes total time of encounter, including 30 minutes spent in face-to-face patient care on the date of this encounter. This time includes coordination of care and counseling regarding above mentioned problem list. Remainder of non-face-to-face time involved reviewing chart documents/testing relevant to the patient encounter and documentation in the medical record. I have independently reviewed documentation from referring provider.  Gwyndolyn Kaufman, MD [place], MD

## 2020-05-07 NOTE — Progress Notes (Signed)
CRITICAL VALUE ALERT  Critical Value:  Troponin 3515   Date & Time Notied:  05/07/20  Provider Notified: Kennon Holter, NP  Orders Received/Actions taken: Awaiting new orders.

## 2020-05-07 NOTE — Anesthesia Postprocedure Evaluation (Signed)
Anesthesia Post Note  Patient: Patricia Garcia  Procedure(s) Performed: TOTAL HIP ARTHROPLASTY ANTERIOR APPROACH (Right Hip)     Patient location during evaluation: PACU Anesthesia Type: General Level of consciousness: awake and responds to stimulation Pain management: pain level controlled Vital Signs Assessment: post-procedure vital signs reviewed and stable Respiratory status: spontaneous breathing, nonlabored ventilation, respiratory function stable and patient connected to nasal cannula oxygen Cardiovascular status: blood pressure returned to baseline and stable Postop Assessment: no apparent nausea or vomiting Anesthetic complications: no   No complications documented.  Last Vitals:  Vitals:   05/04/20 0617 05/04/20 1432  BP:  128/66  Pulse:  73  Resp:  16  Temp:  36.4 C  SpO2: 94% 97%    Last Pain:  Vitals:   05/04/20 1432  TempSrc: Oral  PainSc:                  Audry Pili

## 2020-05-07 NOTE — Progress Notes (Signed)
ANTICOAGULATION CONSULT NOTE - Initial Consult  Pharmacy Consult for heparin Indication: chest pain/ACS  Allergies  Allergen Reactions  . Penicillins Other (See Comments)    unknown  . Prednisone Other (See Comments)    unknown  . Statins Other (See Comments)    shaking  . Sulfonamide Derivatives Other (See Comments)    unknown  . Tramadol Other (See Comments)    unknown    Patient Measurements: Height: 5\' 3"  (160 cm) Weight: 61.5 kg (135 lb 9.3 oz) IBW/kg (Calculated) : 52.4 Heparin Dosing Weight: n/a. Use TBW = 61.5 kg  Vital Signs: Temp: 99.2 F (37.3 C) (10/18 0556) Temp Source: Oral (10/18 0556) BP: 123/58 (10/18 0556) Pulse Rate: 82 (10/18 0556)  Labs: Recent Labs    05/06/20 1106 05/06/20 1859 05/06/20 2148 05/07/20 0524  HGB 7.9*  --   --  7.9*  HCT 24.5*  --   --  23.9*  PLT 290  --   --  283  CREATININE 0.70 0.81  --  0.88  TROPONINIHS  --  2,365* 3,297* 3,515*    Estimated Creatinine Clearance: 38.7 mL/min (by C-G formula based on SCr of 0.88 mg/dL).   Medical History: Past Medical History:  Diagnosis Date  . Anal or rectal pain   . Arthritis   . Depression   . Diverticulosis of colon (without mention of hemorrhage)   . Hypertension   . Internal hemorrhoids without mention of complication   . Ischemic heart disease   . Osteopenia   . Slow transit constipation     Medications: No anticoagulants PTA  Assessment: Pt is a 9 yoF who was recently admitted with right femur fracture and underwent right total hip arthroplasty on 05/02/20. Pt was discharged on ASA 81 mg PO BID for DVT ppx.  Pt readmitted on 10/17 with CC of emesis, prescribed enoxaparin 40 mg subQ daily for DVT ppx. Troponin elevated, pharmacy consulted to dose heparin for ACS.   Baseline labs: -Hgb 7.9 (down from 11.9 pre-op Hgb on 10/11) -Plt 283  Today, 05/07/20  CBC: Hgb 7.9 remains low post-op; Plt WNL  Troponin: 3297 > 3026  Last dose of enoxaparin 40 mg given  10/17 @ 1856  SCr 0.9, CrCl ~38 mL/min  Goal of Therapy:  Heparin level 0.3-0.7 units/ml Monitor platelets by anticoagulation protocol: Yes   Plan:   Discontinue enoxaparin  Per discussion with MD - no heparin bolus given anemia and recent surgery  Initiate heparin infusion at 750 units/hr  Check HL in 8 hours  HL, CBC daily while on heparin  Monitor for signs of bleeding  Lenis Noon, PharmD 05/07/2020,8:31 AM

## 2020-05-07 NOTE — Progress Notes (Signed)
[place] for heparin Indication: NSTEMI   Allergies  Allergen Reactions  . Penicillins Other (See Comments)    unknown  . Prednisone Other (See Comments)    unknown  . Statins Other (See Comments)    shaking  . Sulfonamide Derivatives Other (See Comments)    unknown  . Tramadol Other (See Comments)    unknown    Patient Measurements: Height: 5\' 3"  (160 cm) Weight: 61.5 kg (135 lb 9.3 oz) IBW/kg (Calculated) : 52.4 Heparin Dosing Weight: actual body weight   Vital Signs: Temp: 98.3 F (36.8 C) (10/18 1553) Temp Source: Oral (10/18 1553) BP: 112/51 (10/18 1553) Pulse Rate: 79 (10/18 1553)  Labs: Recent Labs    05/06/20 1106 05/06/20 1859 05/06/20 1859 05/06/20 2148 05/07/20 0524 05/07/20 0725 05/07/20 1908  HGB 7.9*  --   --   --  7.9*  --   --   HCT 24.5*  --   --   --  23.9*  --   --   PLT 290  --   --   --  283  --   --   HEPARINUNFRC  --   --   --   --   --   --  0.16*  CREATININE 0.70 0.81  --   --  0.88  --   --   TROPONINIHS  --  2,365*   < > 3,297* 3,515* 3,026*  --    < > = values in this interval not displayed.    Estimated Creatinine Clearance: 38.7 mL/min (by C-G formula based on SCr of 0.88 mg/dL).   Medical History: Past Medical History:  Diagnosis Date  . Anal or rectal pain   . Arthritis   . Depression   . Diverticulosis of colon (without mention of hemorrhage)   . Hypertension   . Internal hemorrhoids without mention of complication   . Ischemic heart disease   . Osteopenia   . Slow transit constipation     Medications: No anticoagulants PTA  Assessment: Pt is a 11 yoF who was recently admitted with right femur fracture and underwent right total hip arthroplasty on 05/02/20. Pt was discharged on ASA 81 mg PO BID for DVT ppx.  Pt readmitted on 10/17 with CC of emesis, prescribed enoxaparin 40 mg subQ daily for DVT ppx. Troponin elevated, pharmacy consulted to dose heparin for ACS.   Baseline  labs: -Hgb 7.9 (down from 11.9 pre-op Hgb on 10/11) -Plt 283  Today, 05/07/20  CBC: Hgb 7.9 remains low post-op; Plt WNL  Transfusing 1 unit PRBCs  Troponin: 3297 > 3515 > 3026  Last dose of enoxaparin 40 mg given 10/17 @ 1856  SCr 088, CrCl ~39 mL/min  Per discussion with MD - no heparin bolus given anemia and recent surgery  Initial heparin level = 0.16 units/mL, subtherapeutic  No bleeding or infusion issues noted per nursing  Goal of Therapy:  Heparin level 0.3-0.7 units/ml Monitor platelets by anticoagulation protocol: Yes   Plan:   Increase heparin infusion to 950 units/hr  Check heparin level 8 hours after rate change  Daily CBC, heparin level while on heparin infusion  Monitor closely for s/sx of bleeding  Per Cardiology, plans for [facility] tomorrow    Lindell Spar, PharmD, BCPS Clinical Pharmacist  05/07/2020,8:20 PM

## 2020-05-07 NOTE — Progress Notes (Signed)
  Echocardiogram 2D Echocardiogram has been performed.  Patricia Garcia 05/07/2020, 10:34 AM

## 2020-05-07 NOTE — Evaluation (Signed)
Physical Therapy Evaluation Patient Details Name: Patricia Garcia MRN: [id number] DOB: 05/23/1933 Today's Date: 05/07/2020   History of Present Illness  86 yo female admitted with acute CHF, elevated troponing with workup for cardiac event. Recent R hip fx s/p R THA-direct anterior 05/03/2020. Hx of LBBB, fall, osteopenia  Clinical Impression  On eval, pt was Supv-Min guard for mobility. She walked ~75 feet with a RW. Vitals were within normal limits (HR 97 bpm, O2 94% on RA) but pt did c/o some chest pain and had some dyspnea with ambulation. O2 94% on RA during session however placed on 2L  O2 at end of session just to help pt recover. Made RN aware of pt's c/o chest pain when working with PT. Will plan to follow and progress activity as tolerated.     Follow Up Recommendations Home health PT;Supervision - Intermittent    Equipment Recommendations  None recommended by PT    Recommendations for Other Services       Precautions / Restrictions Precautions Precautions: Fall Restrictions Weight Bearing Restrictions: No      Mobility  Bed Mobility Overal bed mobility: Needs Assistance Bed Mobility: Supine to Sit;Sit to Supine     Supine to sit: Min guard;HOB elevated Sit to supine: Min guard;HOB elevated   General bed mobility comments: Increased time. Task is a bit effortful but she completed it without assistance.  Transfers Overall transfer level: Needs assistance Equipment used: Rolling walker (2 wheeled) Transfers: Sit to/from Stand Sit to Stand: Supervision            Ambulation/Gait Ambulation/Gait assistance: Supervision Gait Distance (Feet): 75 Feet Assistive device: Rolling walker (2 wheeled) Gait Pattern/deviations: Step-through pattern;Decreased stride length     General Gait Details: dyspnea 2/4. O2 94%on RA. pt c/o some chest pain towards end of walk.  Stairs            Wheelchair Mobility    Modified Rankin (Stroke Patients Only)        Balance Overall balance assessment: Needs assistance         Standing balance support: Bilateral upper extremity supported Standing balance-Leahy Scale: Fair                               Pertinent Vitals/Pain Pain Assessment: Faces Faces Pain Scale: Hurts little more Pain Location: chest pain with activity Pain Descriptors / Indicators: Discomfort Pain Intervention(s): Limited activity within patient's tolerance;Monitored during session;Repositioned    Home Living Family/patient expects to be discharged to:: Private residence Living Arrangements: Alone Available Help at Discharge: Family;Available 24 hours/day Type of Home: House Home Access: Stairs to enter Entrance Stairs-Rails: Psychiatric nurse of Steps: 3 Home Layout: Able to live on main level with bedroom/bathroom Home Equipment: Walker - 2 wheels;Bedside commode;Cane - single point      Prior Function Level of Independence: Independent               Hand Dominance        Extremity/Trunk Assessment   Upper Extremity Assessment Upper Extremity Assessment: Defer to OT evaluation    Lower Extremity Assessment Lower Extremity Assessment: Generalized weakness    Cervical / Trunk Assessment Cervical / Trunk Assessment: Normal  Communication   Communication: HOH  Cognition Arousal/Alertness: Awake/alert Behavior During Therapy: WFL for tasks assessed/performed Overall Cognitive Status: Within Functional Limits for tasks assessed  General Comments      Exercises     Assessment/Plan    PT Assessment Patient needs continued PT services  PT Problem List Decreased strength;Decreased mobility;Decreased balance;Decreased activity tolerance       PT Treatment Interventions DME instruction;Gait training;Therapeutic activities;Therapeutic exercise;Patient/family education;Balance training;Functional mobility  training    PT Goals (Current goals can be found in the Care Plan section)  Acute Rehab PT Goals Patient Stated Goal: regain independence PT Goal Formulation: With patient Time For Goal Achievement: 05/21/20 Potential to Achieve Goals: Good    Frequency Min 3X/week   Barriers to discharge        Co-evaluation               AM-PAC PT "6 Clicks" Mobility  Outcome Measure Help needed turning from your back to your side while in a flat bed without using bedrails?: None Help needed moving from lying on your back to sitting on the side of a flat bed without using bedrails?: None Help needed moving to and from a bed to a chair (including a wheelchair)?: A Little Help needed standing up from a chair using your arms (e.g., wheelchair or bedside chair)?: A Little Help needed to walk in hospital room?: A Little Help needed climbing 3-5 steps with a railing? : A Little 6 Click Score: 20    End of Session Equipment Utilized During Treatment: Gait belt Activity Tolerance: Patient tolerated treatment well (but had dyspnea and some chest pain) Patient left: in bed;with call bell/phone within reach;with bed alarm set   PT Visit Diagnosis: Unsteadiness on feet (R26.81);Difficulty in walking, not elsewhere classified (R26.2)    Time: 1950-9326 PT Time Calculation (min) (ACUTE ONLY): 32 min   Charges:   PT Evaluation $PT Eval Low Complexity: 1 Low PT Treatments $Gait Training: 8-22 mins           Doreatha Massed, PT Acute Rehabilitation  Office: [phone] Pager: [phone]

## 2020-05-08 ENCOUNTER — Encounter (HOSPITAL_COMMUNITY)
Admission: EM | Disposition: A | Discharge status: Home Health | Payer: Self-pay | Source: Home / Self Care | Attending: Student

## 2020-05-08 DIAGNOSIS — I214 Non-ST elevation (NSTEMI) myocardial infarction: Secondary | ICD-10-CM | POA: Diagnosis not present

## 2020-05-08 DIAGNOSIS — I2510 Atherosclerotic heart disease of native coronary artery without angina pectoris: Secondary | ICD-10-CM | POA: Diagnosis not present

## 2020-05-08 DIAGNOSIS — J9601 Acute respiratory failure with hypoxia: Secondary | ICD-10-CM | POA: Diagnosis not present

## 2020-05-08 DIAGNOSIS — I10 Essential (primary) hypertension: Secondary | ICD-10-CM | POA: Diagnosis not present

## 2020-05-08 DIAGNOSIS — K5900 Constipation, unspecified: Secondary | ICD-10-CM | POA: Diagnosis not present

## 2020-05-08 DIAGNOSIS — I5033 Acute on chronic diastolic (congestive) heart failure: Secondary | ICD-10-CM | POA: Diagnosis not present

## 2020-05-08 HISTORY — PX: LEFT HEART CATH AND CORONARY ANGIOGRAPHY: CATH118249

## 2020-05-08 HISTORY — PX: CORONARY STENT INTERVENTION: CATH118234

## 2020-05-08 LAB — BASIC METABOLIC PANEL
Anion gap: 14 (ref 5–15)
BUN: 16 mg/dL (ref 8–23)
CO2: 26 mmol/L (ref 22–32)
Calcium: 8.6 mg/dL — ABNORMAL LOW (ref 8.9–10.3)
Chloride: 98 mmol/L (ref 98–111)
Creatinine, Ser: 1.04 mg/dL — ABNORMAL HIGH (ref 0.44–1.00)
GFR, Estimated: 49 mL/min — ABNORMAL LOW (ref >60)
Glucose, Bld: 123 mg/dL — ABNORMAL HIGH (ref 70–99)
Potassium: 4.2 mmol/L (ref 3.5–5.1)
Sodium: 138 mmol/L (ref 135–145)

## 2020-05-08 LAB — CBC
HCT: 29.5 % — ABNORMAL LOW (ref 36.0–46.0)
HCT: 30.3 % — ABNORMAL LOW (ref 36.0–46.0)
Hemoglobin: 9.6 g/dL — ABNORMAL LOW (ref 12.0–15.0)
Hemoglobin: 9.8 g/dL — ABNORMAL LOW (ref 12.0–15.0)
MCH: 29.2 pg (ref 26.0–34.0)
MCH: 28.2 pg (ref 26.0–34.0)
MCHC: 32.5 g/dL (ref 30.0–36.0)
MCHC: 32.3 g/dL (ref 30.0–36.0)
MCV: 89.7 fL (ref 80.0–100.0)
MCV: 87.3 fL (ref 80.0–100.0)
Platelets: 312 10*3/uL (ref 150–400)
Platelets: 345 10*3/uL (ref 150–400)
RBC: 3.29 MIL/uL — ABNORMAL LOW (ref 3.87–5.11)
RBC: 3.47 MIL/uL — ABNORMAL LOW (ref 3.87–5.11)
RDW: 14.2 % (ref 11.5–15.5)
RDW: 14.3 % (ref 11.5–15.5)
WBC: 9.8 10*3/uL (ref 4.0–10.5)
WBC: 10.2 10*3/uL (ref 4.0–10.5)
nRBC: 0.6 % — ABNORMAL HIGH (ref 0.0–0.2)
nRBC: 0.4 % — ABNORMAL HIGH (ref 0.0–0.2)

## 2020-05-08 LAB — LIPID PANEL
Cholesterol: 166 mg/dL (ref 0–200)
HDL: 38 mg/dL — ABNORMAL LOW (ref >40)
LDL Cholesterol: 103 mg/dL — ABNORMAL HIGH (ref 0–99)
Total CHOL/HDL Ratio: 4.4 RATIO
Triglycerides: 126 mg/dL (ref <150)
VLDL: 25 mg/dL (ref 0–40)

## 2020-05-08 LAB — HEPARIN LEVEL (UNFRACTIONATED): Heparin Unfractionated: 0.45 IU/mL (ref 0.30–0.70)

## 2020-05-08 LAB — MAGNESIUM: Magnesium: 2.2 mg/dL (ref 1.7–2.4)

## 2020-05-08 LAB — POCT ACTIVATED CLOTTING TIME
Activated Clotting Time: 318 seconds
Activated Clotting Time: 219 seconds

## 2020-05-08 LAB — CREATININE, SERUM
Creatinine, Ser: 1.05 mg/dL — ABNORMAL HIGH (ref 0.44–1.00)
GFR, Estimated: 48 mL/min — ABNORMAL LOW (ref >60)

## 2020-05-08 SURGERY — LEFT HEART CATH AND CORONARY ANGIOGRAPHY
Anesthesia: LOCAL

## 2020-05-08 MED ORDER — TICAGRELOR 90 MG PO TABS
ORAL_TABLET | ORAL | Status: AC
  Filled 2020-05-08: qty 1

## 2020-05-08 MED ORDER — IOHEXOL 350 MG/ML SOLN
INTRAVENOUS | Status: DC | PRN
  Administered 2020-05-08: 120 mL

## 2020-05-08 MED ORDER — MIDAZOLAM HCL 2 MG/2ML IJ SOLN
INTRAMUSCULAR | Status: DC | PRN
  Administered 2020-05-08 (×2): 1 mg via INTRAVENOUS

## 2020-05-08 MED ORDER — VERAPAMIL HCL 2.5 MG/ML IV SOLN
INTRAVENOUS | Status: DC | PRN
  Administered 2020-05-08: 10 mL via INTRA_ARTERIAL

## 2020-05-08 MED ORDER — FUROSEMIDE 20 MG PO TABS
20.0000 mg | ORAL_TABLET | Freq: Every day | ORAL | Status: DC
  Administered 2020-05-08 – 2020-05-09 (×2): 20 mg via ORAL
  Filled 2020-05-08 (×2): qty 1

## 2020-05-08 MED ORDER — SODIUM CHLORIDE 0.9 % IV SOLN: 250.0000 mL | INTRAVENOUS | Status: DC | PRN

## 2020-05-08 MED ORDER — HYDROXYZINE HCL 10 MG PO TABS
10.0000 mg | ORAL_TABLET | Freq: Three times a day (TID) | ORAL | Status: DC | PRN
  Administered 2020-05-08: 10 mg via ORAL
  Filled 2020-05-08 (×2): qty 1

## 2020-05-08 MED ORDER — SODIUM CHLORIDE 0.9% FLUSH: 3.0000 mL | INTRAVENOUS | Status: DC | PRN

## 2020-05-08 MED ORDER — MIDAZOLAM HCL 2 MG/2ML IJ SOLN
INTRAMUSCULAR | Status: AC
  Filled 2020-05-08: qty 2

## 2020-05-08 MED ORDER — NITROGLYCERIN 1 MG/10 ML FOR IR/CATH LAB
INTRA_ARTERIAL | Status: DC | PRN
  Administered 2020-05-08 (×3): 200 ug via INTRACORONARY

## 2020-05-08 MED ORDER — FENTANYL CITRATE (PF) 100 MCG/2ML IJ SOLN
INTRAMUSCULAR | Status: AC
  Filled 2020-05-08: qty 2

## 2020-05-08 MED ORDER — SODIUM CHLORIDE 0.9% FLUSH
3.0000 mL | Freq: Two times a day (BID) | INTRAVENOUS | Status: DC
  Administered 2020-05-09: 3 mL via INTRAVENOUS

## 2020-05-08 MED ORDER — VERAPAMIL HCL 2.5 MG/ML IV SOLN
INTRAVENOUS | Status: AC
  Filled 2020-05-08: qty 2

## 2020-05-08 MED ORDER — HEPARIN SODIUM (PORCINE) 5000 UNIT/ML IJ SOLN
5000.0000 [IU] | Freq: Three times a day (TID) | INTRAMUSCULAR | Status: DC
  Administered 2020-05-09 (×2): 5000 [IU] via SUBCUTANEOUS
  Filled 2020-05-08 (×3): qty 1

## 2020-05-08 MED ORDER — LIDOCAINE HCL (PF) 1 % IJ SOLN
INTRAMUSCULAR | Status: AC
  Filled 2020-05-08: qty 30

## 2020-05-08 MED ORDER — ACETAMINOPHEN 325 MG PO TABS: 650.0000 mg | ORAL_TABLET | ORAL | Status: DC | PRN

## 2020-05-08 MED ORDER — HEPARIN (PORCINE) IN NACL 1000-0.9 UT/500ML-% IV SOLN
INTRAVENOUS | Status: DC | PRN
  Administered 2020-05-08 (×2): 500 mL

## 2020-05-08 MED ORDER — HEPARIN SODIUM (PORCINE) 1000 UNIT/ML IJ SOLN
INTRAMUSCULAR | Status: DC | PRN
  Administered 2020-05-08: 4000 [IU] via INTRAVENOUS
  Administered 2020-05-08: 3000 [IU] via INTRAVENOUS

## 2020-05-08 MED ORDER — HEPARIN SODIUM (PORCINE) 1000 UNIT/ML IJ SOLN
INTRAMUSCULAR | Status: AC
  Filled 2020-05-08: qty 1

## 2020-05-08 MED ORDER — ASPIRIN 81 MG PO CHEW
81.0000 mg | CHEWABLE_TABLET | Freq: Once | ORAL | Status: AC
  Administered 2020-05-08: 81 mg via ORAL
  Filled 2020-05-08: qty 1

## 2020-05-08 MED ORDER — HEPARIN (PORCINE) IN NACL 1000-0.9 UT/500ML-% IV SOLN
INTRAVENOUS | Status: AC
  Filled 2020-05-08: qty 1000

## 2020-05-08 MED ORDER — FENTANYL CITRATE (PF) 100 MCG/2ML IJ SOLN
INTRAMUSCULAR | Status: DC | PRN
  Administered 2020-05-08 (×2): 25 ug via INTRAVENOUS

## 2020-05-08 MED ORDER — ONDANSETRON HCL 4 MG/2ML IJ SOLN
4.0000 mg | Freq: Four times a day (QID) | INTRAMUSCULAR | Status: DC | PRN
  Administered 2020-05-08: 4 mg via INTRAVENOUS
  Filled 2020-05-08: qty 2

## 2020-05-08 MED ORDER — TICAGRELOR 90 MG PO TABS
ORAL_TABLET | ORAL | Status: DC | PRN
  Administered 2020-05-08: 180 mg via ORAL

## 2020-05-08 MED ORDER — NITROGLYCERIN 1 MG/10 ML FOR IR/CATH LAB
INTRA_ARTERIAL | Status: AC
  Filled 2020-05-08: qty 10

## 2020-05-08 MED ORDER — SODIUM CHLORIDE 0.9 % WEIGHT BASED INFUSION: 1.0000 mL/kg/h | INTRAVENOUS | Status: DC

## 2020-05-08 MED ORDER — TICAGRELOR 90 MG PO TABS
90.0000 mg | ORAL_TABLET | Freq: Two times a day (BID) | ORAL | Status: DC
  Administered 2020-05-08 – 2020-05-10 (×4): 90 mg via ORAL
  Filled 2020-05-08 (×4): qty 1

## 2020-05-08 MED ORDER — SODIUM CHLORIDE 0.9 % IV SOLN: INTRAVENOUS | Status: DC

## 2020-05-08 MED ORDER — SODIUM CHLORIDE 0.9% FLUSH: 3.0000 mL | Freq: Two times a day (BID) | INTRAVENOUS | Status: DC

## 2020-05-08 MED ORDER — LIDOCAINE HCL (PF) 1 % IJ SOLN
INTRAMUSCULAR | Status: DC | PRN
  Administered 2020-05-08: 2 mL

## 2020-05-08 SURGICAL SUPPLY — 19 items
BALLN EUPHORA RX 2.25X12 (BALLOONS) ×2
BALLN SAPPHIRE ~~LOC~~ 3.25X12 (BALLOONS) ×1 IMPLANT
BALLOON EUPHORA RX 2.25X12 (BALLOONS) IMPLANT
CATH 5FR JL3.5 JR4 ANG PIG MP (CATHETERS) ×1 IMPLANT
CATH LAUNCHER 5F EBU3.5 (CATHETERS) ×1 IMPLANT
CATH VISTA GUIDE 6FR JR4 (CATHETERS) ×1 IMPLANT
DEVICE RAD COMP TR BAND LRG (VASCULAR PRODUCTS) ×1 IMPLANT
GLIDESHEATH SLEND SS 6F .021 (SHEATH) ×1 IMPLANT
GUIDEWIRE INQWIRE 1.5J.035X260 (WIRE) IMPLANT
INQWIRE 1.5J .035X260CM (WIRE) ×2
KIT ENCORE 26 ADVANTAGE (KITS) ×1 IMPLANT
KIT HEART LEFT (KITS) ×2 IMPLANT
PACK CARDIAC CATHETERIZATION (CUSTOM PROCEDURE TRAY) ×2 IMPLANT
SHEATH PROBE COVER 6X72 (BAG) ×1 IMPLANT
STENT RESOLUTE ONYX 2.25X15 (Permanent Stent) ×1 IMPLANT
STENT RESOLUTE ONYX 3.0X15 (Permanent Stent) ×1 IMPLANT
TRANSDUCER W/STOPCOCK (MISCELLANEOUS) ×2 IMPLANT
TUBING CIL FLEX 10 FLL-RA (TUBING) ×2 IMPLANT
WIRE ASAHI PROWATER 180CM (WIRE) ×1 IMPLANT

## 2020-05-08 NOTE — Progress Notes (Addendum)
Progress Note  Patient Name: Patricia Garcia Date of Encounter: 05/08/2020  [facility] Cardiologist: Freada Bergeron, MD   Subjective   No acute overnight events. No chest pain. She is a little tearful this morning. She states she had a "panic attack" this morning and had some shortness of breath during this. Denies any shortness of breath outside of panic attack. No recurrent nausea/vomiting. She is still on board for cardiac cath today. Answered any remaining questions about procedure.  Inpatient Medications    Scheduled Meds: . sodium chloride   Intravenous Once  . aspirin  81 mg Oral Daily  . aspirin  81 mg Oral Once  . cholecalciferol  1,000 Units Oral Daily  . citalopram  20 mg Oral Daily  . cloNIDine  0.1 mg Oral BID  . docusate sodium  100 mg Oral BID  . ezetimibe  10 mg Oral Daily  . famotidine  40 mg Oral Daily  . ferrous sulfate  325 mg Oral Once per day on Mon Wed Fri  . furosemide  20 mg Intravenous BID  . latanoprost  1 drop Both Eyes QHS  . loratadine  10 mg Oral Daily  . metoprolol tartrate  25 mg Oral BID  . multivitamin with minerals  1 tablet Oral Daily  . omega-3 acid ethyl esters  1,000 mg Oral Daily  . pantoprazole  40 mg Oral Daily  . sodium chloride flush  3 mL Intravenous Q12H  . sodium chloride flush  3 mL Intravenous Q12H   Continuous Infusions: . sodium chloride    . sodium chloride 10 mL/hr at 05/08/20 0635  . heparin 950 Units/hr (05/07/20 2039)   PRN Meds: sodium chloride, acetaminophen **OR** acetaminophen, HYDROcodone-acetaminophen, hydrOXYzine, metoprolol tartrate, senna, sodium chloride, sodium chloride flush   Vital Signs    Vitals:   05/08/20 0121 05/08/20 0322 05/08/20 0441 05/08/20 0600  BP: (!) 106/55 124/75 126/69   Pulse: 66 76 67   Resp: 20 18 20    Temp: 98.5 F (36.9 C) 98 F (36.7 C) 98.5 F (36.9 C)   TempSrc: Oral Oral Oral   SpO2: 94% 94% 94%   Weight:   61 kg 61 kg  Height:        Intake/Output  Summary (Last 24 hours) at 05/08/2020 0818 Last data filed at 05/08/2020 0630 Gross per 24 hour  Intake 1796.29 ml  Output 1000 ml  Net 796.29 ml   Last 3 Weights 05/08/2020 05/08/2020 05/06/2020  Weight (lbs) 134 lb 7.7 oz 134 lb 7.7 oz 135 lb 9.3 oz  Weight (kg) 61 kg 61 kg 61.5 kg      Telemetry    Normal sinus rhythm with rates in the 70's to 80's. - Personally Reviewed  ECG    No acute overnight events. - Personally Reviewed  Physical Exam   GEN: No acute distress.   Neck: Supple. No JVD. Cardiac: RRR. No murmurs, rubs, or gallops. Radial and distal pedal pulses 2+ and equal bilaterally. Respiratory: No increased work of breathing. Mild crackles in bilateral bases.  GI: Soft, non-tender, non-distended. Bowel sounds present. MS: No lower extremity edema. No deformity. Skin: Warm and dry. Neuro:  No focal deficits. Psych: Normal affect   Labs    High Sensitivity Troponin:   Recent Labs  Lab 05/06/20 1859 05/06/20 2148 05/07/20 0524 05/07/20 0725  TROPONINIHS 2,365* 3,297* 3,515* 3,026*      Chemistry Recent Labs  Lab 05/06/20 1106 05/06/20 1106 05/06/20 1859 05/07/20 0524  05/08/20 0457  NA 137  --   --  138 138  K 3.3*  --   --  3.2* 4.2  CL 102  --   --  100 98  CO2 24  --   --  28 26  GLUCOSE 122*  --   --  118* 123*  BUN 12  --   --  11 16  CREATININE 0.70   < > 0.81 0.88 1.04*  CALCIUM 8.1*  --   --  8.1* 8.6*  PROT 5.8*  --   --   --   --   ALBUMIN 2.7*  --   --   --   --   AST 60*  --   --   --   --   ALT 24  --   --   --   --   ALKPHOS 49  --   --   --   --   BILITOT 0.6  --   --   --   --   GFRNONAA >60   < > >60 60* 49*  ANIONGAP 11  --   --  10 14   < > = values in this interval not displayed.     Hematology Recent Labs  Lab 05/06/20 1106 05/07/20 0524 05/08/20 0457  WBC 10.1 10.7* 9.8  RBC 2.68* 2.72* 3.29*  HGB 7.9* 7.9* 9.6*  HCT 24.5* 23.9* 29.5*  MCV 91.4 87.9 89.7  MCH 29.5 29.0 29.2  MCHC 32.2 33.1 32.5  RDW 13.5  13.6 14.2  PLT 290 283 312    BNP Recent Labs  Lab 05/06/20 1413  BNP 1,221.6*     DDimer No results for input(s): DDIMER in the last 168 hours.   Radiology    CT ABDOMEN PELVIS W CONTRAST  Result Date: 05/06/2020 CLINICAL DATA:  Nausea, vomiting. EXAM: CT ABDOMEN AND PELVIS WITH CONTRAST TECHNIQUE: Multidetector CT imaging of the abdomen and pelvis was performed using the standard protocol following bolus administration of intravenous contrast. CONTRAST:  138mL OMNIPAQUE IOHEXOL 300 MG/ML  SOLN COMPARISON:  10/10/2016 FINDINGS: Lower chest: Small bilateral pleural effusions. Dependent atelectasis in the lower lobes. Hepatobiliary: 14 mm low-density lesion in the left hepatic lobe is stable since prior study compatible with cyst. Adjacent enhancing lesion most compatible with hemangioma. No suspicious focal hepatic abnormality. Gallbladder unremarkable. Pancreas: No focal abnormality or ductal dilatation. Spleen: No focal abnormality.  Normal size. Adrenals/Urinary Tract: Previously seen right renal stone has migrated into the right renal pelvis, layering dependently and nonobstructing. This measures 7 mm. No hydronephrosis. Adrenal glands and urinary bladder grossly unremarkable. Gas within the urinary bladder presumably from recent catheterization. Stomach/Bowel: Left colonic diverticulosis. No active diverticulitis. Stomach and small bowel unremarkable. Vascular/Lymphatic: Aortic atherosclerosis. No evidence of aneurysm or adenopathy. Reproductive: Prior hysterectomy.  No adnexal masses. Other: No free fluid or free air. Musculoskeletal: Prior right hip replacement. IMPRESSION: 7 mm stone layering dependently in the right renal pelvis. No obstruction. Small bilateral pleural effusions.  Dependent atelectasis. Left colonic diverticulosis.  No active diverticulitis. Aortic atherosclerosis. Electronically Signed   By: Rolm Baptise M.D.   On: 05/06/2020 15:20   DG Abd Acute W/Chest  Result Date:  05/06/2020 CLINICAL DATA:  Vomiting, status post right hip total arthroplasty EXAM: X-RAY ABDOMEN 2 VIEW COMPARISON:  None. FINDINGS: There is no evidence of dilated bowel loops or free intraperitoneal air. Right-sided renal calculus. Multiple phleboliths in the pelvis. Mild cardiomegaly. Mild, diffuse bilateral interstitial pulmonary opacity.  Status post right hip total arthroplasty. IMPRESSION: 1. Nonobstructive bowel gas pattern. 2. Right-sided nephrolithiasis. 3. Mild, diffuse bilateral interstitial pulmonary opacity, likely edema in the setting of cardiomegaly. No focal airspace opacity. 4. Status post right hip total arthroplasty. Electronically Signed   By: Eddie Candle M.D.   On: 05/06/2020 11:22   ECHOCARDIOGRAM COMPLETE  Result Date: 05/07/2020    ECHOCARDIOGRAM REPORT   Patient Name:   Patricia Garcia Date of Exam: 05/07/2020 Medical Rec #:  [id number]        Height:       63.0 in Accession #:    [id number]       Weight:       135.6 lb Date of Birth:  1933/05/04        BSA:          1.639 m Patient Age:    57 years         BP:           123/58 mmHg Patient Gender: F                HR:           82 bpm. Exam Location:  Inpatient Procedure: 2D Echo, Cardiac Doppler and Color Doppler Indications:    Dyspnea  History:        Patient has no prior history of Echocardiogram examinations.                 Cardiomyopathy, Arrythmias:LBBB, Signs/Symptoms:Dyspnea and                 Pleurall effusion; Risk Factors:Hypertension. Elevated Troponin,                 s/p hip replacement.  Sonographer:    Dustin Flock Referring Phys: [id number] [place]  1. Hypokinesis of the basal inferior and inferolateral wall with overall preserved LV function.  2. Left ventricular ejection fraction, by estimation, is 50 to 55%. The left ventricle has low normal function. The left ventricle demonstrates regional wall motion abnormalities (see scoring diagram/findings for description). Left ventricular diastolic   parameters are consistent with Grade II diastolic dysfunction (pseudonormalization). Elevated left atrial pressure.  3. Right ventricular systolic function is normal. The right ventricular size is normal. There is mildly elevated pulmonary artery systolic pressure.  4. Moderate pleural effusion in the left lateral region.  5. The mitral valve is normal in structure. Mild mitral valve regurgitation. No evidence of mitral stenosis.  6. The aortic valve is tricuspid. Aortic valve regurgitation is trivial. No aortic stenosis is present.  7. The inferior vena cava is normal in size with greater than 50% respiratory variability, suggesting right atrial pressure of 3 mmHg. FINDINGS  Left Ventricle: Left ventricular ejection fraction, by estimation, is 50 to 55%. The left ventricle has low normal function. The left ventricle demonstrates regional wall motion abnormalities. The left ventricular internal cavity size was normal in size. There is no left ventricular hypertrophy. Left ventricular diastolic parameters are consistent with Grade II diastolic dysfunction (pseudonormalization). Elevated left atrial pressure. Right Ventricle: The right ventricular size is normal.Right ventricular systolic function is normal. There is mildly elevated pulmonary artery systolic pressure. The tricuspid regurgitant velocity is 3.08 m/s, and with an assumed right atrial pressure of  3 mmHg, the estimated right ventricular systolic pressure is 09.2 mmHg. Left Atrium: Left atrial size was normal in size. Right Atrium: Right atrial size was normal in size. Pericardium: There is no  evidence of pericardial effusion. Mitral Valve: The mitral valve is normal in structure. Mild mitral annular calcification. Mild mitral valve regurgitation. No evidence of mitral valve stenosis. Tricuspid Valve: The tricuspid valve is normal in structure. Tricuspid valve regurgitation is mild . No evidence of tricuspid stenosis. Aortic Valve: The aortic valve is  tricuspid. Aortic valve regurgitation is trivial. No aortic stenosis is present. Pulmonic Valve: The pulmonic valve was normal in structure. Pulmonic valve regurgitation is trivial. No evidence of pulmonic stenosis. Aorta: The aortic root is normal in size and structure. Venous: The inferior vena cava is normal in size with greater than 50% respiratory variability, suggesting right atrial pressure of 3 mmHg.  Additional Comments: Hypokinesis of the basal inferior and inferolateral wall with overall preserved LV function. There is a moderate pleural effusion in the left lateral region.  LEFT VENTRICLE PLAX 2D LVIDd:         4.70 cm  Diastology LVIDs:         3.30 cm  LV e' medial:    5.77 cm/s LV PW:         1.10 cm  LV E/e' medial:  18.2 LV IVS:        1.10 cm  LV e' lateral:   8.70 cm/s LVOT diam:     2.00 cm  LV E/e' lateral: 12.1 LV SV:         56 LV SV Index:   34 LVOT Area:     3.14 cm  RIGHT VENTRICLE RV Basal diam:  3.20 cm RV S prime:     8.16 cm/s TAPSE (M-mode): 2.1 cm LEFT ATRIUM             Index       RIGHT ATRIUM           Index LA diam:        4.00 cm 2.44 cm/m  RA Area:     13.20 cm LA Vol (A2C):   32.6 ml 19.89 ml/m RA Volume:   31.10 ml  18.97 ml/m LA Vol (A4C):   53.9 ml 32.88 ml/m LA Biplane Vol: 42.2 ml 25.74 ml/m  AORTIC VALVE LVOT Vmax:   100.00 cm/s LVOT Vmean:  76.800 cm/s LVOT VTI:    0.178 m  AORTA Ao Root diam: 2.40 cm MITRAL VALVE                TRICUSPID VALVE MV Area (PHT): 5.75 cm     TR Peak grad:   37.9 mmHg MV Decel Time: 132 msec     TR Vmax:        308.00 cm/s MV E velocity: 105.00 cm/s MV A velocity: 96.00 cm/s   SHUNTS MV E/A ratio:  1.09         Systemic VTI:  0.18 m                             Systemic Diam: 2.00 cm Kirk Ruths MD Electronically signed by Kirk Ruths MD Signature Date/Time: 05/07/2020/12:45:14 PM    Final     Cardiac Studies   Echocardiogram 05/07/2020: Impressions: 1. Hypokinesis of the basal inferior and inferolateral wall with overall   preserved LV function.  2. Left ventricular ejection fraction, by estimation, is 50 to 55%. The  left ventricle has low normal function. The left ventricle demonstrates  regional wall motion abnormalities (see scoring diagram/findings for  description). Left ventricular diastolic  parameters are consistent  with Grade II diastolic dysfunction  (pseudonormalization). Elevated left atrial pressure.  3. Right ventricular systolic function is normal. The right ventricular  size is normal. There is mildly elevated pulmonary artery systolic  pressure.  4. Moderate pleural effusion in the left lateral region.  5. The mitral valve is normal in structure. Mild mitral valve  regurgitation. No evidence of mitral stenosis.  6. The aortic valve is tricuspid. Aortic valve regurgitation is trivial.  No aortic stenosis is present.  7. The inferior vena cava is normal in size with greater than 50%  respiratory variability, suggesting right atrial pressure of 3 mmHg.  Patient Profile     86 y.o. female LBBB, hypertension, diverticulosis, arthritis, depression,a and recent right femur fracture s/p total hip arthroplasty on 05/02/2020 who is being seen today for the evaluation of CHF and markedly elevated troponin concerning for NSTEMI at the request of Dr. Grandville Silos.  Assessment & Plan    NSTEMI - Patient presented with nausea and vomiting and found to have CHF exacerbation and markedly elevated troponin concerning for NSTEMI. - High-sensitivity troponin 2,365 >> 3,297 >> 3,515 >> 3,026. - EKG with known LBBB. - Echo showed LVEF of 50-55% with hypokinesis of the basal inferior and inferolateral wall, grade 2 diastolic dysfunction, moderate left pleural effusion, and mildly elevated PASP. - No chest pain. - Continue IV Heparin. - Continue Aspirin 81mg  daily, Lopressor 25mg  twice daily, and Zetia 10mg  daily (intolerant to statins in past). Recommend [id number] as outpatient. - Plan is for cardiac  catheterization today. Patient consented yesterday. Of note, she was concerned about student performing the procedure and does not want case to be videotaped. I reassured her that an experienced Interventionalist would be performing the procedure.  Acute Hypoxic Respiratory Failure Secondary to Acute on Chronic Diastolic CHF - BNP elevated at 1,221. - Chest x-ray showed bilateral interstitial pulmonary opacity felt to be pulmonary edema given cardiomegaly. - Started on IV Lasix 20mg  twice daily. Documented 1 L of urinary output yesterday. Only net negative 503 mL given need for transfusion. Creatinine starting to rise. - Echo as above. - Mild crackles noted in bases but otherwise appears euvolemic. - Already received morning dose of IV Lasix. Further diuresis pending LVEDP on cath. - Continue to monitor daily weight, strict I/O's, and renal function.  Hypertension - BP well controlled. - Continue home Clonidine. Patient states she has been taking this for years. - Continue Lopressor 25mg  twice daily.  Normocytic Anemia - Hemoglobin 7.9. Down trending since hip surgery last week. Was 11.9 on 04/30/2020. - Received 1 unit of PRBC early this morning and hemoglobin improved to 9.6. - Iron mildly low at 24. Ferritin normal. TIBC normal.  - Vitamin B12 and Folate normal. - Patient denies any abnormal bleeding. - Hemoccult pending. - Possible acute blood loss anemia from recent surgery.  - Management per primary team.   Hypokalemia - Potassium 3.2 yesterday. Supplemented and 4.2 today. - Continue to monitor.  Otherwise, per primary team. - Recent femoral neck fracture s/p right total arthroplasty on 05/02/2020 - Nausea/vomiting - Anxiety  For questions or updates, please contact [facility] Please consult [email or web address] for contact info under     Signed, Darreld Mclean, PA-C  05/08/2020, 8:18 AM    Patient seen and examined post-cath and agree with Sande Rives, PA as  detailed above.  In brief, patient is a 86 year old female with chronic LBBB, hypertension and a recent right femur fracture s/p total hip arthroplasty on 05/02/2020  who presented with NSTEMI found to have 3vessel CAD s/p PCI to RCA (culprit lesion) and D1.  Exam: General: Comfortable CV: RR, no murmurs Resp: CTAB Extremities: Right wrist access site with TR band in place. Otherwise warm and no significant edema Neuro: AAOx3  Plan: -Patient s/p PCI to RCA and D1 lesions  -Continue ASA and ticagrelor for at least 1 year; ASA indefinitely -Transition to metop succinate 25mg  XL tomorrow -Start losartan 25mg  tomorrow pending blood pressures and Cr -Pending continued clinical improvement, can possibly discharge tomorrow with close cardiology follow-up-->patient requesting PT/OT evaluation prior to discharge  Gwyndolyn Kaufman, MD

## 2020-05-08 NOTE — Progress Notes (Signed)
PROGRESS NOTE    SHUNNA MIKAELIAN  ERX:[id number] DOB: 04-25-1933 DOA: 05/06/2020 PCP: Lajean Manes, MD    Chief Complaint  Patient presents with  . Emesis    Brief Narrative:  HPI per Dr. Neysa Bonito This is an 86 year old female with a history of hypertension, anxiety, ischemic cardiomyopathy with LBBB, osteopenia, diverticulosis, recent right femur fracture s/p total hip arthroplasty on 05/02/2020 by Dr. Lyla Glassing and discharged on 10/15 who had been recovering at home and doing well until last night when she started having some nausea and vomiting which kept her up most of the night.  No abdominal pain, diarrhea, no chest pain or shortness of breath.  No leg swelling or any other issues.  Of note, she has been having some postnasal drainage for the past month or so.  ED Course: Afebrile, tachycardic, tachypneic, hypoxic requiring 2-3 LPM.  In the ED she was given antiemetics and her nausea and vomiting improved and CT scan did not show any obstruction or other acute process.  She did not have any respiratory complaints but did have a new oxygen requirement.  CXR in the ED suggested pulmonary edema.  EKG and BNP were added on and IV fluids were held.  BNP found to be 1221.  She was given Lasix 20 mg IV x1.  Other notable labs were K-3.3, AST 60, ALT 24, Hb 7.9 (previously 8.1 on 10/15 but baseline is around 12).  Upon my personal assessment the patient was satting 87 to 88% on 2 LPM requiring up to 4 LPM.  Assessment & Plan:   Principal Problem:   Acute respiratory failure with hypoxia ([place]) Active Problems:   Acute exacerbation of CHF (congestive heart failure) (HCC)   NSTEMI (non-ST elevated myocardial infarction) (HCC)   HYPERTENSION, BENIGN   Constipation   GERD (gastroesophageal reflux disease)   Femur fracture, right (HCC)   LBBB (left bundle branch block)   Sinus congestion   Postoperative anemia  1 acute hypoxic respiratory failure secondary to acute diastolic CHF  exacerbation/non-STEMI Patient presented with new O2 requirements of up to 4 L nasal cannula on day of admission.  Patient had presented with nausea and dry heaves.  BNP on admission noted to be elevated at 1221.  Chest x-ray done on admission concerning for pulmonary edema.  Cardiac enzymes were cycled which came back elevated at 2365>>> 3297>>>> 3515.  Patient placed on IV Lasix every 12 hours with a urine output of 1 L over the past 24 hours.  Patient is -503.7 cc over this hospitalization. Weight of 134.48 pounds from 135.58 pounds on admission.  2D echo was obtained with EF of 50 to 55%, left ventricular wall motion abnormalities, grade 2 diastolic dysfunction, elevated left atrial pressure, mildly elevated pulmonary artery systolic pressure, moderate pleural effusion in the left lateral region, mild mitral valve regurgitation.  Patient denied any chest pain.  Patient denied any significant shortness of breath.  Patient started on a heparin drip, Lopressor added per cardiology.  Continue aspirin, [place]. Cardiology consulted due to concerns for non-STEMI and acute CHF exacerbation.  Cardiology recommending continuation of IV Lasix, heparin, beta-blocker, transfusion of 1 unit packed red blood cells to keep hemoglobin greater than 8, transferred to [facility] for left heart catheterization.  Patient status post transfusion 1 unit packed red blood cells.  Patient to [facility] hospital today for left heart catheterization.  Per cardiology.   2.  Postop acute blood loss anemia/anemia of chronic disease Patient with recent right  hip surgery.  Patient denies any overt bleeding.  Hemoglobin currently at 9.6 from 7.9 after transfusion of 1 unit packed red blood cells.  Anemia panel with iron of 24, TIBC of 239, ferritin of 231, folate of 15.7.  Due to problem #1 cardiology recommended transfusion of 1 unit of packed red blood cells to keep hemoglobin greater than 8.  Follow H&H.  3.  Status post recent  right hip total arthroplasty on 10/13 per Dr. San Morelle.  Patient on full dose heparin secondary to problem #1.  Also placed on aspirin.  Continue pain management.  Bowel regimen.  PT/OT.  Oncology, Dr. Lyla Glassing informed of patient's admission.   4.  Sinus congestion with postnasal drip, likely viral Continue Zyrtec, Flonase.  Follow.  5.  Nausea vomiting Likely secondary to problem #1.  Resolved.  Supportive care.  6.  Hypertension Continue clonidine.  Lopressor started 05/07/2020 per cardiology.  7.  Anxiety Celexa.  Place on hydroxyzine as needed.  8.  Hypokalemia Repleted.  Potassium of 4.2.  Magnesium at 2.2.   9.  GERD Continue PPI.  10.  Constipation Colace.    DVT prophylaxis: Heparin Code Status: Full Family Communication: Updated patient and son at bedside. Disposition:   Status is: Inpatient    Dispo: The patient is from: Home              Anticipated d/c is to: To be determined.  Likely home.              Anticipated d/c date is: 4 to 5 days              Patient currently in acute CHF exacerbation, concern for non-STEMI, being transferred to [facility] for evaluation for cardiac catheterization.  Not stable for discharge.       Consultants:   Cardiology: Dr. Johney Frame 05/07/2020  Procedures:   2D echo 05/07/2020  CT abdomen and pelvis 05/06/2020  Acute abdominal series 05/06/2020  Transfuse 1 unit packed red blood cells 05/08/2020  Antimicrobials:   None   Subjective: Patient in bed, tearful, son at bedside.  Patient stated had a panic attack this morning and anxious about current procedure.  Stated had to be placed on the nasal cannula.  Anxious about cardiac catheterization this morning.   Objective: Vitals:   05/08/20 0121 05/08/20 0322 05/08/20 0441 05/08/20 0600  BP: (!) 106/55 124/75 126/69   Pulse: 66 76 67   Resp: 20 18 20    Temp: 98.5 F (36.9 C) 98 F (36.7 C) 98.5 F (36.9 C)   TempSrc: Oral Oral Oral    SpO2: 94% 94% 94%   Weight:   61 kg 61 kg  Height:        Intake/Output Summary (Last 24 hours) at 05/08/2020 0748 Last data filed at 05/08/2020 0630 Gross per 24 hour  Intake 1796.29 ml  Output 1000 ml  Net 796.29 ml   Filed Weights   05/06/20 2125 05/08/20 0441 05/08/20 0600  Weight: 61.5 kg 61 kg 61 kg    Examination:  General exam: Anxious.  Tearful.  Respiratory system: Diffuse bibasilar crackles.  No wheezing.  No rhonchi.  Speaking in full sentences.  Normal respiratory effort.   Cardiovascular system: RRR no murmurs rubs or gallops.  No JVD.  No lower extremity edema.    Gastrointestinal system: Abdomen is soft, nontender, nondistended, positive bowel sounds.  No rebound.  No guarding. Central nervous system: Alert and oriented. No focal neurological deficits. Extremities:  Symmetric 5 x 5 power.  Right lower extremity thigh with dressing in place, nontender to palpation, no significant swelling. Skin: No rashes, lesions or ulcers Psychiatry: Judgement and insight appear normal. Mood & affect appropriate.     Data Reviewed: I have personally reviewed following labs and imaging studies  CBC: Recent Labs  Lab 05/03/20 0348 05/04/20 0329 05/06/20 1106 05/07/20 0524 05/08/20 0457  WBC 14.9* 13.1* 10.1 10.7* 9.8  NEUTROABS  --   --  8.2*  --   --   HGB 9.9* 8.1* 7.9* 7.9* 9.6*  HCT 30.8* 25.3* 24.5* 23.9* 29.5*  MCV 89.5 89.1 91.4 87.9 89.7  PLT 217 255 290 283 101    Basic Metabolic Panel: Recent Labs  Lab 05/03/20 0348 05/03/20 0348 05/04/20 0329 05/06/20 1106 05/06/20 1859 05/07/20 0524 05/07/20 0725 05/08/20 0457  NA 136  --  137 137  --  138  --  138  K 4.3  --  3.9 3.3*  --  3.2*  --  4.2  CL 102  --  102 102  --  100  --  98  CO2 22  --  26 24  --  28  --  26  GLUCOSE 193*  --  116* 122*  --  118*  --  123*  BUN 17  --  25* 12  --  11  --  16  CREATININE 1.09*   < > 1.18* 0.70 0.81 0.88  --  1.04*  CALCIUM 8.1*  --  8.3* 8.1*  --  8.1*  --   8.6*  MG  --   --   --   --   --   --  1.8 2.2   < > = values in this interval not displayed.    GFR: Estimated Creatinine Clearance: 32.7 mL/min (A) (by C-G formula based on SCr of 1.04 mg/dL (H)).  Liver Function Tests: Recent Labs  Lab 05/06/20 1106  AST 60*  ALT 24  ALKPHOS 49  BILITOT 0.6  PROT 5.8*  ALBUMIN 2.7*    CBG: No results for input(s): GLUCAP in the last 168 hours.   Recent Results (from the past 240 hour(s))  Respiratory Panel by RT PCR (Flu A&B, Covid) - Nasopharyngeal Swab     Status: None   Collection Time: 04/30/20  8:21 PM   Specimen: Nasopharyngeal Swab  Result Value Ref Range Status   SARS Coronavirus 2 by RT PCR NEGATIVE NEGATIVE Final    Comment: (NOTE) SARS-CoV-2 target nucleic acids are NOT DETECTED.  The SARS-CoV-2 RNA is generally detectable in upper respiratoy specimens during the acute phase of infection. The lowest concentration of SARS-CoV-2 viral copies this assay can detect is 131 copies/mL. A negative result does not preclude SARS-Cov-2 infection and should not be used as the sole basis for treatment or other patient management decisions. A negative result may occur with  improper specimen collection/handling, submission of specimen other than nasopharyngeal swab, presence of viral mutation(s) within the areas targeted by this assay, and inadequate number of viral copies (<131 copies/mL). A negative result must be combined with clinical observations, patient history, and epidemiological information. The expected result is Negative.  Fact Sheet for Patients:  PinkCheek.be  Fact Sheet for Healthcare Providers:  GravelBags.it  This test is no t yet approved or cleared by the [place] FDA and  has been authorized for detection and/or diagnosis of SARS-CoV-2 by FDA under an Emergency Use Authorization (EUA). This EUA will remain  in  effect (meaning this test can be used)  for the duration of the COVID-19 declaration under Section 564(b)(1) of the Act, 21 U.S.C. section 360bbb-3(b)(1), unless the authorization is terminated or revoked sooner.     Influenza A by PCR NEGATIVE NEGATIVE Final   Influenza B by PCR NEGATIVE NEGATIVE Final    Comment: (NOTE) The Xpert Xpress SARS-CoV-2/FLU/RSV assay is intended as an aid in  the diagnosis of influenza from Nasopharyngeal swab specimens and  should not be used as a sole basis for treatment. Nasal washings and  aspirates are unacceptable for Xpert Xpress SARS-CoV-2/FLU/RSV  testing.  Fact Sheet for Patients: PinkCheek.be  Fact Sheet for Healthcare Providers: GravelBags.it  This test is not yet approved or cleared by the [place] FDA and  has been authorized for detection and/or diagnosis of SARS-CoV-2 by  FDA under an Emergency Use Authorization (EUA). This EUA will remain  in effect (meaning this test can be used) for the duration of the  Covid-19 declaration under Section 564(b)(1) of the Act, 21  U.S.C. section 360bbb-3(b)(1), unless the authorization is  terminated or revoked. Performed at [facility], [place] [address], [place], [place] [address]   Surgical PCR screen     Status: Abnormal   Collection Time: 05/01/20  1:15 AM   Specimen: Nasal Mucosa; Nasal Swab  Result Value Ref Range Status   MRSA, PCR NEGATIVE NEGATIVE Final   Staphylococcus aureus POSITIVE (A) NEGATIVE Final    Comment: (NOTE) The Xpert SA Assay (FDA approved for NASAL specimens in patients 64 years of age and older), is one component of a comprehensive surveillance program. It is not intended to diagnose infection nor to guide or monitor treatment. Performed at [facility], [place] [address] [place], [place] [address]   Respiratory Panel by RT PCR (Flu A&B, Covid) - Nasopharyngeal Swab     Status: None   Collection Time:  05/06/20 11:06 AM   Specimen: Nasopharyngeal Swab  Result Value Ref Range Status   SARS Coronavirus 2 by RT PCR NEGATIVE NEGATIVE Final    Comment: (NOTE) SARS-CoV-2 target nucleic acids are NOT DETECTED.  The SARS-CoV-2 RNA is generally detectable in upper respiratoy specimens during the acute phase of infection. The lowest concentration of SARS-CoV-2 viral copies this assay can detect is 131 copies/mL. A negative result does not preclude SARS-Cov-2 infection and should not be used as the sole basis for treatment or other patient management decisions. A negative result may occur with  improper specimen collection/handling, submission of specimen other than nasopharyngeal swab, presence of viral mutation(s) within the areas targeted by this assay, and inadequate number of viral copies (<131 copies/mL). A negative result must be combined with clinical observations, patient history, and epidemiological information. The expected result is Negative.  Fact Sheet for Patients:  PinkCheek.be  Fact Sheet for Healthcare Providers:  GravelBags.it  This test is no t yet approved or cleared by the [place] FDA and  has been authorized for detection and/or diagnosis of SARS-CoV-2 by FDA under an Emergency Use Authorization (EUA). This EUA will remain  in effect (meaning this test can be used) for the duration of the COVID-19 declaration under Section 564(b)(1) of the Act, 21 U.S.C. section 360bbb-3(b)(1), unless the authorization is terminated or revoked sooner.     Influenza A by PCR NEGATIVE NEGATIVE Final   Influenza B by PCR NEGATIVE NEGATIVE Final    Comment: (NOTE) The Xpert Xpress SARS-CoV-2/FLU/RSV assay is intended as an aid in  the diagnosis of influenza from Nasopharyngeal swab specimens and  should not be used as a sole basis for treatment. Nasal washings and  aspirates are unacceptable for Xpert Xpress  SARS-CoV-2/FLU/RSV  testing.  Fact Sheet for Patients: PinkCheek.be  Fact Sheet for Healthcare Providers: GravelBags.it  This test is not yet approved or cleared by the [place] FDA and  has been authorized for detection and/or diagnosis of SARS-CoV-2 by  FDA under an Emergency Use Authorization (EUA). This EUA will remain  in effect (meaning this test can be used) for the duration of the  Covid-19 declaration under Section 564(b)(1) of the Act, 21  U.S.C. section 360bbb-3(b)(1), unless the authorization is  terminated or revoked. Performed at [facility], [place] [address] [place], [place] [address]          Radiology Studies: CT ABDOMEN PELVIS W CONTRAST  Result Date: 05/06/2020 CLINICAL DATA:  Nausea, vomiting. EXAM: CT ABDOMEN AND PELVIS WITH CONTRAST TECHNIQUE: Multidetector CT imaging of the abdomen and pelvis was performed using the standard protocol following bolus administration of intravenous contrast. CONTRAST:  157mL OMNIPAQUE IOHEXOL 300 MG/ML  SOLN COMPARISON:  10/10/2016 FINDINGS: Lower chest: Small bilateral pleural effusions. Dependent atelectasis in the lower lobes. Hepatobiliary: 14 mm low-density lesion in the left hepatic lobe is stable since prior study compatible with cyst. Adjacent enhancing lesion most compatible with hemangioma. No suspicious focal hepatic abnormality. Gallbladder unremarkable. Pancreas: No focal abnormality or ductal dilatation. Spleen: No focal abnormality.  Normal size. Adrenals/Urinary Tract: Previously seen right renal stone has migrated into the right renal pelvis, layering dependently and nonobstructing. This measures 7 mm. No hydronephrosis. Adrenal glands and urinary bladder grossly unremarkable. Gas within the urinary bladder presumably from recent catheterization. Stomach/Bowel: Left colonic diverticulosis. No active diverticulitis. Stomach and small bowel  unremarkable. Vascular/Lymphatic: Aortic atherosclerosis. No evidence of aneurysm or adenopathy. Reproductive: Prior hysterectomy.  No adnexal masses. Other: No free fluid or free air. Musculoskeletal: Prior right hip replacement. IMPRESSION: 7 mm stone layering dependently in the right renal pelvis. No obstruction. Small bilateral pleural effusions.  Dependent atelectasis. Left colonic diverticulosis.  No active diverticulitis. Aortic atherosclerosis. Electronically Signed   By: Rolm Baptise M.D.   On: 05/06/2020 15:20   DG Abd Acute W/Chest  Result Date: 05/06/2020 CLINICAL DATA:  Vomiting, status post right hip total arthroplasty EXAM: X-RAY ABDOMEN 2 VIEW COMPARISON:  None. FINDINGS: There is no evidence of dilated bowel loops or free intraperitoneal air. Right-sided renal calculus. Multiple phleboliths in the pelvis. Mild cardiomegaly. Mild, diffuse bilateral interstitial pulmonary opacity. Status post right hip total arthroplasty. IMPRESSION: 1. Nonobstructive bowel gas pattern. 2. Right-sided nephrolithiasis. 3. Mild, diffuse bilateral interstitial pulmonary opacity, likely edema in the setting of cardiomegaly. No focal airspace opacity. 4. Status post right hip total arthroplasty. Electronically Signed   By: Eddie Candle M.D.   On: 05/06/2020 11:22   ECHOCARDIOGRAM COMPLETE  Result Date: 05/07/2020    ECHOCARDIOGRAM REPORT   Patient Name:   KORRIN WATERFIELD Date of Exam: 05/07/2020 Medical Rec #:  [id number]        Height:       63.0 in Accession #:    [id number]       Weight:       135.6 lb Date of Birth:  July 19, 1933        BSA:          1.639 m Patient Age:    34 years  BP:           123/58 mmHg Patient Gender: F                HR:           82 bpm. Exam Location:  Inpatient Procedure: 2D Echo, Cardiac Doppler and Color Doppler Indications:    Dyspnea  History:        Patient has no prior history of Echocardiogram examinations.                 Cardiomyopathy, Arrythmias:LBBB,  Signs/Symptoms:Dyspnea and                 Pleurall effusion; Risk Factors:Hypertension. Elevated Troponin,                 s/p hip replacement.  Sonographer:    Dustin Flock Referring Phys: [id number] [place]  1. Hypokinesis of the basal inferior and inferolateral wall with overall preserved LV function.  2. Left ventricular ejection fraction, by estimation, is 50 to 55%. The left ventricle has low normal function. The left ventricle demonstrates regional wall motion abnormalities (see scoring diagram/findings for description). Left ventricular diastolic  parameters are consistent with Grade II diastolic dysfunction (pseudonormalization). Elevated left atrial pressure.  3. Right ventricular systolic function is normal. The right ventricular size is normal. There is mildly elevated pulmonary artery systolic pressure.  4. Moderate pleural effusion in the left lateral region.  5. The mitral valve is normal in structure. Mild mitral valve regurgitation. No evidence of mitral stenosis.  6. The aortic valve is tricuspid. Aortic valve regurgitation is trivial. No aortic stenosis is present.  7. The inferior vena cava is normal in size with greater than 50% respiratory variability, suggesting right atrial pressure of 3 mmHg. FINDINGS  Left Ventricle: Left ventricular ejection fraction, by estimation, is 50 to 55%. The left ventricle has low normal function. The left ventricle demonstrates regional wall motion abnormalities. The left ventricular internal cavity size was normal in size. There is no left ventricular hypertrophy. Left ventricular diastolic parameters are consistent with Grade II diastolic dysfunction (pseudonormalization). Elevated left atrial pressure. Right Ventricle: The right ventricular size is normal.Right ventricular systolic function is normal. There is mildly elevated pulmonary artery systolic pressure. The tricuspid regurgitant velocity is 3.08 m/s, and with an assumed right  atrial pressure of  3 mmHg, the estimated right ventricular systolic pressure is 32.6 mmHg. Left Atrium: Left atrial size was normal in size. Right Atrium: Right atrial size was normal in size. Pericardium: There is no evidence of pericardial effusion. Mitral Valve: The mitral valve is normal in structure. Mild mitral annular calcification. Mild mitral valve regurgitation. No evidence of mitral valve stenosis. Tricuspid Valve: The tricuspid valve is normal in structure. Tricuspid valve regurgitation is mild . No evidence of tricuspid stenosis. Aortic Valve: The aortic valve is tricuspid. Aortic valve regurgitation is trivial. No aortic stenosis is present. Pulmonic Valve: The pulmonic valve was normal in structure. Pulmonic valve regurgitation is trivial. No evidence of pulmonic stenosis. Aorta: The aortic root is normal in size and structure. Venous: The inferior vena cava is normal in size with greater than 50% respiratory variability, suggesting right atrial pressure of 3 mmHg.  Additional Comments: Hypokinesis of the basal inferior and inferolateral wall with overall preserved LV function. There is a moderate pleural effusion in the left lateral region.  LEFT VENTRICLE PLAX 2D LVIDd:         4.70 cm  Diastology  LVIDs:         3.30 cm  LV e' medial:    5.77 cm/s LV PW:         1.10 cm  LV E/e' medial:  18.2 LV IVS:        1.10 cm  LV e' lateral:   8.70 cm/s LVOT diam:     2.00 cm  LV E/e' lateral: 12.1 LV SV:         56 LV SV Index:   34 LVOT Area:     3.14 cm  RIGHT VENTRICLE RV Basal diam:  3.20 cm RV S prime:     8.16 cm/s TAPSE (M-mode): 2.1 cm LEFT ATRIUM             Index       RIGHT ATRIUM           Index LA diam:        4.00 cm 2.44 cm/m  RA Area:     13.20 cm LA Vol (A2C):   32.6 ml 19.89 ml/m RA Volume:   31.10 ml  18.97 ml/m LA Vol (A4C):   53.9 ml 32.88 ml/m LA Biplane Vol: 42.2 ml 25.74 ml/m  AORTIC VALVE LVOT Vmax:   100.00 cm/s LVOT Vmean:  76.800 cm/s LVOT VTI:    0.178 m  AORTA Ao Root  diam: 2.40 cm MITRAL VALVE                TRICUSPID VALVE MV Area (PHT): 5.75 cm     TR Peak grad:   37.9 mmHg MV Decel Time: 132 msec     TR Vmax:        308.00 cm/s MV E velocity: 105.00 cm/s MV A velocity: 96.00 cm/s   SHUNTS MV E/A ratio:  1.09         Systemic VTI:  0.18 m                             Systemic Diam: 2.00 cm Kirk Ruths MD Electronically signed by Kirk Ruths MD Signature Date/Time: 05/07/2020/12:45:14 PM    Final         Scheduled Meds: . sodium chloride   Intravenous Once  . aspirin  81 mg Oral Daily  . cholecalciferol  1,000 Units Oral Daily  . citalopram  20 mg Oral Daily  . cloNIDine  0.1 mg Oral BID  . docusate sodium  100 mg Oral BID  . ezetimibe  10 mg Oral Daily  . famotidine  40 mg Oral Daily  . ferrous sulfate  325 mg Oral Once per day on Mon Wed Fri  . furosemide  20 mg Intravenous BID  . latanoprost  1 drop Both Eyes QHS  . loratadine  10 mg Oral Daily  . metoprolol tartrate  25 mg Oral BID  . multivitamin with minerals  1 tablet Oral Daily  . omega-3 acid ethyl esters  1,000 mg Oral Daily  . pantoprazole  40 mg Oral Daily  . sodium chloride flush  3 mL Intravenous Q12H  . sodium chloride flush  3 mL Intravenous Q12H   Continuous Infusions: . sodium chloride    . sodium chloride 10 mL/hr at 05/08/20 0635  . heparin 950 Units/hr (05/07/20 2039)     LOS: 2 days    Time spent: 35 minutes    Irine Seal, MD [facility]   To contact the attending provider between 7A-7P or  the covering provider during after hours 7P-7A, please log into the web site [email or web address] and access using universal [place] password for that web site. If you do not have the password, please call the hospital operator.  05/08/2020, 7:48 AM

## 2020-05-08 NOTE — Progress Notes (Signed)
[place] for heparin Indication: NSTEMI   Allergies  Allergen Reactions  . Penicillins Other (See Comments)    unknown  . Prednisone Other (See Comments)    unknown  . Statins Other (See Comments)    shaking  . Sulfonamide Derivatives Other (See Comments)    unknown  . Tramadol Other (See Comments)    unknown    Patient Measurements: Height: 5\' 3"  (160 cm) Weight: 61.5 kg (135 lb 9.3 oz) IBW/kg (Calculated) : 52.4 Heparin Dosing Weight: actual body weight   Vital Signs: Temp: 98.5 F (36.9 C) (10/19 0441) Temp Source: Oral (10/19 0441) BP: 126/69 (10/19 0441) Pulse Rate: 67 (10/19 0441)  Labs: Recent Labs    05/06/20 1106 05/06/20 1106 05/06/20 1859 05/06/20 1859 05/06/20 2148 05/07/20 0524 05/07/20 0725 05/07/20 1908 05/08/20 0457  HGB 7.9*   < >  --   --   --  7.9*  --   --  9.6*  HCT 24.5*  --   --   --   --  23.9*  --   --  29.5*  PLT 290  --   --   --   --  283  --   --  312  HEPARINUNFRC  --   --   --   --   --   --   --  0.16* 0.45  CREATININE 0.70  --  0.81  --   --  0.88  --   --   --   TROPONINIHS  --   --  2,365*   < > 3,297* 3,515* 3,026*  --   --    < > = values in this interval not displayed.    Estimated Creatinine Clearance: 38.7 mL/min (by C-G formula based on SCr of 0.88 mg/dL).   Medical History: Past Medical History:  Diagnosis Date  . Anal or rectal pain   . Arthritis   . Depression   . Diverticulosis of colon (without mention of hemorrhage)   . Hypertension   . Internal hemorrhoids without mention of complication   . Ischemic heart disease   . Osteopenia   . Slow transit constipation     Medications: No anticoagulants PTA  Assessment: Pt is a 64 yoF who was recently admitted with right femur fracture and underwent right total hip arthroplasty on 05/02/20. Pt was discharged on ASA 81 mg PO BID for DVT ppx.  Pt readmitted on 10/17 with CC of emesis, prescribed enoxaparin 40 mg subQ  daily for DVT ppx. Troponin elevated, pharmacy consulted to dose heparin for ACS.   Baseline labs: -Hgb 7.9 (down from 11.9 pre-op Hgb on 10/11) -Plt 283  Today, 05/08/20   HL 0.45 therapeutic  CBC: Hgb 9.6 remains low post-op; Plt WNL  Troponin: 3297 > 3515 > 3026  Last dose of enoxaparin 40 mg given 10/17 @ 1856  SCr 088, CrCl ~39 mL/min  Per discussion with MD - no heparin bolus given anemia and recent surgery  No bleeding or infusion issues noted   Goal of Therapy:  Heparin level 0.3-0.7 units/ml Monitor platelets by anticoagulation protocol: Yes   Plan:   Continue heparin infusion at 950 units/hr  Check heparin level 8 hours   Daily CBC, heparin level while on heparin infusion  Monitor closely for s/sx of bleeding    Dolly Rias RPh 05/08/2020, 5:38 AM

## 2020-05-08 NOTE — Interval H&P Note (Signed)
History and Physical Interval Note:  05/08/2020 1:42 PM  Patricia Garcia  has presented today for surgery, with the diagnosis of nstemi.  The various methods of treatment have been discussed with the patient and family. After consideration of risks, benefits and other options for treatment, the patient has consented to  Procedure(s): LEFT HEART CATH AND CORONARY ANGIOGRAPHY (N/A) as a surgical intervention.  The patient's history has been reviewed, patient examined, no change in status, stable for surgery.  I have reviewed the patient's chart and labs.  Questions were answered to the patient's satisfaction.    Cath Lab Visit (complete for each Cath Lab visit)  Clinical Evaluation Leading to the Procedure:   ACS: Yes.    Non-ACS:    Anginal Classification: CCS IV  Anti-ischemic medical therapy: Minimal Therapy (1 class of medications)  Non-Invasive Test Results: No non-invasive testing performed  Prior CABG: No previous CABG       Patricia Garcia [facility] 05/08/2020 1:42 PM

## 2020-05-08 NOTE — Progress Notes (Signed)
Pt transferred to [facility] via [facility] for heart catheterization at this time.

## 2020-05-09 ENCOUNTER — Encounter (HOSPITAL_COMMUNITY): Payer: Self-pay | Admitting: Cardiology

## 2020-05-09 DIAGNOSIS — I5033 Acute on chronic diastolic (congestive) heart failure: Secondary | ICD-10-CM | POA: Diagnosis not present

## 2020-05-09 DIAGNOSIS — I447 Left bundle-branch block, unspecified: Secondary | ICD-10-CM | POA: Diagnosis not present

## 2020-05-09 DIAGNOSIS — I214 Non-ST elevation (NSTEMI) myocardial infarction: Secondary | ICD-10-CM | POA: Diagnosis not present

## 2020-05-09 DIAGNOSIS — S72041D Displaced fracture of base of neck of right femur, subsequent encounter for closed fracture with routine healing: Secondary | ICD-10-CM

## 2020-05-09 DIAGNOSIS — J9601 Acute respiratory failure with hypoxia: Secondary | ICD-10-CM | POA: Diagnosis not present

## 2020-05-09 DIAGNOSIS — R5381 Other malaise: Secondary | ICD-10-CM

## 2020-05-09 LAB — BASIC METABOLIC PANEL
Anion gap: 9 (ref 5–15)
BUN: 14 mg/dL (ref 8–23)
CO2: 29 mmol/L (ref 22–32)
Calcium: 8.2 mg/dL — ABNORMAL LOW (ref 8.9–10.3)
Chloride: 96 mmol/L — ABNORMAL LOW (ref 98–111)
Creatinine, Ser: 1.02 mg/dL — ABNORMAL HIGH (ref 0.44–1.00)
GFR, Estimated: 50 mL/min — ABNORMAL LOW (ref >60)
Glucose, Bld: 112 mg/dL — ABNORMAL HIGH (ref 70–99)
Potassium: 3.6 mmol/L (ref 3.5–5.1)
Sodium: 134 mmol/L — ABNORMAL LOW (ref 135–145)

## 2020-05-09 LAB — BPAM RBC
Blood Product Expiration Date: 202111012359
ISSUE DATE / TIME: 202110190100
Unit Type and Rh: 6200

## 2020-05-09 LAB — TYPE AND SCREEN
ABO/RH(D): A POS
ABO/RH(D): A POS
Antibody Screen: NEGATIVE
Antibody Screen: NEGATIVE
Unit division: 0

## 2020-05-09 LAB — CBC
HCT: 26.6 % — ABNORMAL LOW (ref 36.0–46.0)
Hemoglobin: 8.5 g/dL — ABNORMAL LOW (ref 12.0–15.0)
MCH: 28.3 pg (ref 26.0–34.0)
MCHC: 32 g/dL (ref 30.0–36.0)
MCV: 88.7 fL (ref 80.0–100.0)
Platelets: 315 10*3/uL (ref 150–400)
RBC: 3 MIL/uL — ABNORMAL LOW (ref 3.87–5.11)
RDW: 14.4 % (ref 11.5–15.5)
WBC: 9.8 10*3/uL (ref 4.0–10.5)
nRBC: 0.3 % — ABNORMAL HIGH (ref 0.0–0.2)

## 2020-05-09 LAB — HEMOGLOBIN A1C
Hgb A1c MFr Bld: 5.7 % — ABNORMAL HIGH (ref 4.8–5.6)
Mean Plasma Glucose: 117 mg/dL

## 2020-05-09 LAB — MAGNESIUM: Magnesium: 1.9 mg/dL (ref 1.7–2.4)

## 2020-05-09 MED ORDER — SENNOSIDES-DOCUSATE SODIUM 8.6-50 MG PO TABS: 1.0000 | ORAL_TABLET | Freq: Two times a day (BID) | ORAL | Status: DC | PRN

## 2020-05-09 MED ORDER — METOPROLOL SUCCINATE ER 25 MG PO TB24
25.0000 mg | ORAL_TABLET | Freq: Every day | ORAL | Status: DC
  Administered 2020-05-09 – 2020-05-10 (×2): 25 mg via ORAL
  Filled 2020-05-09 (×2): qty 1

## 2020-05-09 MED ORDER — POLYETHYLENE GLYCOL 3350 17 G PO PACK: 17.0000 g | PACK | Freq: Two times a day (BID) | ORAL | Status: DC | PRN

## 2020-05-09 NOTE — TOC Benefit Eligibility Note (Signed)
Transition of Care [facility]) Benefit Eligibility Note    Patient Details  Name: Patricia Garcia MRN: [id number] Date of Birth: 04-14-1933   Medication/Dose: Kary Kos   90 MG BID        Prescription Coverage [facility]erred [facility]: [facility]              Additional Notes: NO PHARMACY  BENEFITS ON FILE.     CALL [facility] HAS A DISCOUNT CARD    Memory Argue Phone Number: 05/09/2020, 11:37 AM

## 2020-05-09 NOTE — Progress Notes (Signed)
PROGRESS NOTE  Patricia Garcia:[id number] DOB: 11-13-1932   PCP: Lajean Manes, MD  Patient is from: Home  DOA: 05/06/2020 LOS: 3  Chief complaints: Nausea and vomiting  Brief Narrative / Interim history: 86 year old female with history of HTN, ischemic CM, LBBB, osteopenia, recent hospitalization from 10/13-10/15 for right femoral fracture s/p THA by Dr. Lyla Glassing returning with acute nausea and vomiting, and found to have non-STEMI, acute hypoxemic respiratory failure in the setting of diastolic CHF exacerbation.  Patient had no cardiopulmonary symptoms on admission.  In ED, saturating at 87- 88% on 2 L requiring 4 L to maintain appropriate saturation.  CT abdomen and pelvis without acute finding.  CXR concerning for pulmonary edema.  BNP elevated to 1221.  AST 60.  Hgb 7.9 (about baseline).  Started on IV Lasix.  High-sensitivity troponin came back elevated at 2365.  Started on heparin drip and transferred to [facility].  Eventually underwent LHC that revealed three-vessel obstructive CAD, and had DES stent to mid RCA and first diagonal.  Started on DAPT.  Cardiology following.  Therapy recommending SNF.  Subjective: Seen and examined earlier this morning.  No major events overnight of this morning.  No complaints.  Reports feeling well.  Denies chest pain, dyspnea, palpitation, GI or UTI symptoms.  Objective: Vitals:   05/09/20 0018 05/09/20 0330 05/09/20 0749 05/09/20 1229  BP: (!) 104/51 (!) 108/53 (!) 113/48 (!) 122/57  Pulse: 69 67 66 80  Resp: 16 14 15 14   Temp: 98.1 F (36.7 C) 98 F (36.7 C) 98.5 F (36.9 C) 98.7 F (37.1 C)  TempSrc: Oral Oral Oral Oral  SpO2: 95% 95% 100% 98%  Weight:  62.9 kg    Height:        Intake/Output Summary (Last 24 hours) at 05/09/2020 1605 Last data filed at 05/09/2020 0019 Gross per 24 hour  Intake 880 ml  Output 300 ml  Net 580 ml   Filed Weights   05/08/20 0441 05/08/20 0600 05/09/20 0330  Weight: 61 kg 61 kg 62.9 kg     Examination:  GENERAL: No apparent distress.  Nontoxic. HEENT: MMM.  Vision and hearing grossly intact.  NECK: Supple.  No apparent JVD.  RESP: 99% on 0.5 L.  No IWOB.  Fair aeration bilaterally. CVS:  RRR. Heart sounds normal.  ABD/GI/GU: BS+. Abd soft, NTND.  MSK/EXT:  Moves extremities. No apparent deformity. No edema.  SKIN: no apparent skin lesion or wound NEURO: Awake, alert and oriented x4 except to date.  No apparent focal neuro deficit. PSYCH: Calm. Normal affect.  Procedures:  05/08/2020 LHC with successful PCI to RCA and D1  Microbiology summarized: COVID-19 PCR negative. Influenza PCR negative.  Assessment & Plan: Acute hypoxemic respiratory failure due to acute on chronic diastolic CHF and non-STEMI: Resolved. -Treat CHF and NSTEMI as below -Encourage incentive spirometry  Non-STEMI: presented with nausea and vomiting without cardiopulmonary symptoms.  High-sensitivity troponin 2365>> 3515> 3026 -Status post PCI with DES to mid RCA and D1. -On beta-blocker and DAPT per cardiology -Allergic to statin.  Continue Zetia.  May need PSK 9 inhibitors outpatient. -Cardiac rehab on discharge  Acute on chronic diastolic CHF: No cardiopulmonary symptoms but hypoxemia on presentation.  TTE with LVEF of 50 to 55% with hypokinesis of basal inferior and inferolateral wall, G2 DD, moderate left pleural effusion, mildly elevated PASP.  Appears euvolemic.  On room air.  I&O incomplete. -Continue p.o. Lasix 20 mg daily per cardiology -On metoprolol XL.  ACEI/ARB outpatient by  cardiology. -Monitor fluid status, renal function and electrolytes -Sodium and fluid restriction -Cardiac rehab as above.  Postop acute blood loss anemia on anemia of chronic disease:  Relatively stable.  Anemia panel consistent with anemia of chronic disease.  Received 1 units -Baseline Hgb 11-12 >8.1 on discharge on 10/15>7.9 (admit)> 1 unit> 9.6 (exaggerated)> 8.6 -Monitor H&H. -Continue p.o. iron  with bowel regimen  Right femoral neck fracture s/p recent right THA by Dr. Lyla Glassing on 10/13-stable. -Continue PT/OT -Outpatient follow-up with orthopedic surgery as previously planned  Sinus congestion with postnasal drip -Supportive care with Zyrtec and Flonase  Mild hyponatremia: Na 134 -Continue monitoring  Nausea/vomiting: Likely related to non-STEMI.  CT abdomen and pelvis without acute finding.  Resolved.  Essential hypertension: Somewhat soft blood pressures earlier this morning -On Lasix and metoprolol. -Discontinue clonidine  Anxiety: Stable -Continue home Celexa  GERD -Continue PPI  Constipation -Continue bowel regimen  Debility/physical deconditioning -PT/OT-recommended SNF.  Body mass index is 24.56 kg/m.         DVT prophylaxis:  heparin injection 5,000 Units Start: 05/09/20 0600 SCDs Start: 05/06/20 1750  Code Status: Full code Family Communication: Patient and/or RN. Available if any question.  Status is: Inpatient  Remains inpatient appropriate because:Unsafe d/c plan   Dispo: The patient is from: Home              Anticipated d/c is to: SNF              Anticipated d/c date is: 1 day              Patient currently is medically stable to d/c.       Consultants:  Cardiology   Sch Meds:  Scheduled Meds: . sodium chloride   Intravenous Once  . aspirin  81 mg Oral Daily  . cholecalciferol  1,000 Units Oral Daily  . citalopram  20 mg Oral Daily  . cloNIDine  0.1 mg Oral BID  . docusate sodium  100 mg Oral BID  . ezetimibe  10 mg Oral Daily  . famotidine  40 mg Oral Daily  . ferrous sulfate  325 mg Oral Once per day on Mon Wed Fri  . furosemide  20 mg Oral Daily  . heparin  5,000 Units Subcutaneous Q8H  . latanoprost  1 drop Both Eyes QHS  . loratadine  10 mg Oral Daily  . metoprolol succinate  25 mg Oral Daily  . multivitamin with minerals  1 tablet Oral Daily  . omega-3 acid ethyl esters  1,000 mg Oral Daily  .  pantoprazole  40 mg Oral Daily  . sodium chloride flush  3 mL Intravenous Q12H  . sodium chloride flush  3 mL Intravenous Q12H  . ticagrelor  90 mg Oral BID   Continuous Infusions: . sodium chloride     PRN Meds:.sodium chloride, acetaminophen, HYDROcodone-acetaminophen, hydrOXYzine, ondansetron (ZOFRAN) IV, senna, sodium chloride, sodium chloride flush  Antimicrobials: Anti-infectives (From admission, onward)   None       I have personally reviewed the following labs and images: CBC: Recent Labs  Lab 05/06/20 1106 05/07/20 0524 05/08/20 0457 05/08/20 1832 05/09/20 0259  WBC 10.1 10.7* 9.8 10.2 9.8  NEUTROABS 8.2*  --   --   --   --   HGB 7.9* 7.9* 9.6* 9.8* 8.5*  HCT 24.5* 23.9* 29.5* 30.3* 26.6*  MCV 91.4 87.9 89.7 87.3 88.7  PLT 290 283 312 345 315   BMP &GFR Recent Labs  Lab 05/04/20 0329  05/04/20 0329 05/06/20 1106 05/06/20 1106 05/06/20 1859 05/07/20 0524 05/07/20 0725 05/08/20 0457 05/08/20 1832 05/09/20 0259  NA 137  --  137  --   --  138  --  138  --  134*  K 3.9  --  3.3*  --   --  3.2*  --  4.2  --  3.6  CL 102  --  102  --   --  100  --  98  --  96*  CO2 26  --  24  --   --  28  --  26  --  29  GLUCOSE 116*  --  122*  --   --  118*  --  123*  --  112*  BUN 25*  --  12  --   --  11  --  16  --  14  CREATININE 1.18*   < > 0.70   < > 0.81 0.88  --  1.04* 1.05* 1.02*  CALCIUM 8.3*  --  8.1*  --   --  8.1*  --  8.6*  --  8.2*  MG  --   --   --   --   --   --  1.8 2.2  --  1.9   < > = values in this interval not displayed.   Estimated Creatinine Clearance: 36 mL/min (A) (by C-G formula based on SCr of 1.02 mg/dL (H)). Liver & Pancreas: Recent Labs  Lab 05/06/20 1106  AST 60*  ALT 24  ALKPHOS 49  BILITOT 0.6  PROT 5.8*  ALBUMIN 2.7*   Recent Labs  Lab 05/06/20 1106  LIPASE 19   No results for input(s): AMMONIA in the last 168 hours. Diabetic: Recent Labs    05/08/20 0457  HGBA1C 5.7*   No results for input(s): GLUCAP in the last  168 hours. Cardiac Enzymes: No results for input(s): CKTOTAL, CKMB, CKMBINDEX, TROPONINI in the last 168 hours. No results for input(s): PROBNP in the last 8760 hours. Coagulation Profile: No results for input(s): INR, PROTIME in the last 168 hours. Thyroid Function Tests: No results for input(s): TSH, T4TOTAL, FREET4, T3FREE, THYROIDAB in the last 72 hours. Lipid Profile: Recent Labs    05/08/20 0457  CHOL 166  HDL 38*  LDLCALC 103*  TRIG 126  CHOLHDL 4.4   Anemia Panel: Recent Labs    05/07/20 0725  VITAMINB12 627  FOLATE 15.7  FERRITIN 231  TIBC 239*  IRON 24*   Urine analysis:    Component Value Date/Time   COLORURINE YELLOW 05/06/2020 1025   APPEARANCEUR CLEAR 05/06/2020 1025   LABSPEC 1.012 05/06/2020 1025   PHURINE 5.0 05/06/2020 1025   GLUCOSEU NEGATIVE 05/06/2020 1025   GLUCOSEU NEGATIVE 06/25/2010 1249   HGBUR NEGATIVE 05/06/2020 1025   BILIRUBINUR NEGATIVE 05/06/2020 1025   KETONESUR 5 (A) 05/06/2020 1025   PROTEINUR NEGATIVE 05/06/2020 1025   UROBILINOGEN 0.2 06/25/2010 1249   NITRITE NEGATIVE 05/06/2020 1025   LEUKOCYTESUR NEGATIVE 05/06/2020 1025   Sepsis Labs: Invalid input(s): PROCALCITONIN, [place]  Microbiology: Recent Results (from the past 240 hour(s))  Respiratory Panel by RT PCR (Flu A&B, Covid) - Nasopharyngeal Swab     Status: None   Collection Time: 04/30/20  8:21 PM   Specimen: Nasopharyngeal Swab  Result Value Ref Range Status   SARS Coronavirus 2 by RT PCR NEGATIVE NEGATIVE Final    Comment: (NOTE) SARS-CoV-2 target nucleic acids are NOT DETECTED.  The SARS-CoV-2 RNA is generally detectable in  upper respiratoy specimens during the acute phase of infection. The lowest concentration of SARS-CoV-2 viral copies this assay can detect is 131 copies/mL. A negative result does not preclude SARS-Cov-2 infection and should not be used as the sole basis for treatment or other patient management decisions. A negative result may occur  with  improper specimen collection/handling, submission of specimen other than nasopharyngeal swab, presence of viral mutation(s) within the areas targeted by this assay, and inadequate number of viral copies (<131 copies/mL). A negative result must be combined with clinical observations, patient history, and epidemiological information. The expected result is Negative.  Fact Sheet for Patients:  PinkCheek.be  Fact Sheet for Healthcare Providers:  GravelBags.it  This test is no t yet approved or cleared by the [place] FDA and  has been authorized for detection and/or diagnosis of SARS-CoV-2 by FDA under an Emergency Use Authorization (EUA). This EUA will remain  in effect (meaning this test can be used) for the duration of the COVID-19 declaration under Section 564(b)(1) of the Act, 21 U.S.C. section 360bbb-3(b)(1), unless the authorization is terminated or revoked sooner.     Influenza A by PCR NEGATIVE NEGATIVE Final   Influenza B by PCR NEGATIVE NEGATIVE Final    Comment: (NOTE) The Xpert Xpress SARS-CoV-2/FLU/RSV assay is intended as an aid in  the diagnosis of influenza from Nasopharyngeal swab specimens and  should not be used as a sole basis for treatment. Nasal washings and  aspirates are unacceptable for Xpert Xpress SARS-CoV-2/FLU/RSV  testing.  Fact Sheet for Patients: PinkCheek.be  Fact Sheet for Healthcare Providers: GravelBags.it  This test is not yet approved or cleared by the [place] FDA and  has been authorized for detection and/or diagnosis of SARS-CoV-2 by  FDA under an Emergency Use Authorization (EUA). This EUA will remain  in effect (meaning this test can be used) for the duration of the  Covid-19 declaration under Section 564(b)(1) of the Act, 21  U.S.C. section 360bbb-3(b)(1), unless the authorization is  terminated or  revoked. Performed at [facility], [place] [address], [place], [place] [address]   Surgical PCR screen     Status: Abnormal   Collection Time: 05/01/20  1:15 AM   Specimen: Nasal Mucosa; Nasal Swab  Result Value Ref Range Status   MRSA, PCR NEGATIVE NEGATIVE Final   Staphylococcus aureus POSITIVE (A) NEGATIVE Final    Comment: (NOTE) The Xpert SA Assay (FDA approved for NASAL specimens in patients 46 years of age and older), is one component of a comprehensive surveillance program. It is not intended to diagnose infection nor to guide or monitor treatment. Performed at [facility], [place] [address], [place],  [address]   Respiratory Panel by RT PCR (Flu A&B, Covid) - Nasopharyngeal Swab     Status: None   Collection Time: 05/06/20 11:06 AM   Specimen: Nasopharyngeal Swab  Result Value Ref Range Status   SARS Coronavirus 2 by RT PCR NEGATIVE NEGATIVE Final    Comment: (NOTE) SARS-CoV-2 target nucleic acids are NOT DETECTED.  The SARS-CoV-2 RNA is generally detectable in upper respiratoy specimens during the acute phase of infection. The lowest concentration of SARS-CoV-2 viral copies this assay can detect is 131 copies/mL. A negative result does not preclude SARS-Cov-2 infection and should not be used as the sole basis for treatment or other patient management decisions. A negative result may occur with  improper specimen collection/handling, submission of specimen other than nasopharyngeal swab, presence of viral mutation(s)  within the areas targeted by this assay, and inadequate number of viral copies (<131 copies/mL). A negative result must be combined with clinical observations, patient history, and epidemiological information. The expected result is Negative.  Fact Sheet for Patients:  PinkCheek.be  Fact Sheet for Healthcare Providers:  GravelBags.it  This test is no t  yet approved or cleared by the [place] FDA and  has been authorized for detection and/or diagnosis of SARS-CoV-2 by FDA under an Emergency Use Authorization (EUA). This EUA will remain  in effect (meaning this test can be used) for the duration of the COVID-19 declaration under Section 564(b)(1) of the Act, 21 U.S.C. section 360bbb-3(b)(1), unless the authorization is terminated or revoked sooner.     Influenza A by PCR NEGATIVE NEGATIVE Final   Influenza B by PCR NEGATIVE NEGATIVE Final    Comment: (NOTE) The Xpert Xpress SARS-CoV-2/FLU/RSV assay is intended as an aid in  the diagnosis of influenza from Nasopharyngeal swab specimens and  should not be used as a sole basis for treatment. Nasal washings and  aspirates are unacceptable for Xpert Xpress SARS-CoV-2/FLU/RSV  testing.  Fact Sheet for Patients: PinkCheek.be  Fact Sheet for Healthcare Providers: GravelBags.it  This test is not yet approved or cleared by the [place] FDA and  has been authorized for detection and/or diagnosis of SARS-CoV-2 by  FDA under an Emergency Use Authorization (EUA). This EUA will remain  in effect (meaning this test can be used) for the duration of the  Covid-19 declaration under Section 564(b)(1) of the Act, 21  U.S.C. section 360bbb-3(b)(1), unless the authorization is  terminated or revoked. Performed at [facility], [place] [address] [place], [place] [address]     Radiology Studies: No results found.    Adonis Yim T. [place]  If 7PM-7AM, please contact night-coverage [email or web address] 05/09/2020, 4:05 PM

## 2020-05-09 NOTE — Progress Notes (Addendum)
Physical Therapy Treatment Patient Details Name: Patricia Garcia MRN: [id number] DOB: [birth year] Today's Date: 05/09/2020    History of Present Illness 86 yo female admitted with acute CHF, elevated troponin. Pt with NSTEMI and transferred to [facility]. Pt underwent cardiac cath 10/19 and found to have severe CAD and stents placed. Recent R hip fx s/p R THA-direct anterior 05/03/2020. Hx of LBBB, fall, osteopenia    PT Comments    Pt now s/p cardiac cath with stents placed. Pt and son both express that pt struggled at home after hip surgery and they don't feel she has the support at home. Recommend ST-SNF and pt/son are in agreement. Will update case management.    Follow Up Recommendations  SNF     Equipment Recommendations  None recommended by PT    Recommendations for Other Services       Precautions / Restrictions Precautions Precautions: Fall    Mobility  Bed Mobility               General bed mobility comments: Pt up in chair  Transfers Overall transfer level: Needs assistance Equipment used: Rolling walker (2 wheeled) Transfers: Sit to/from Stand Sit to Stand: Min assist         General transfer comment: Assist to for balance when rising  Ambulation/Gait Ambulation/Gait assistance: Min guard;Min assist Gait Distance (Feet): 150 Feet Assistive device: Rolling walker (2 wheeled) Gait Pattern/deviations: Step-through pattern;Decreased stride length Gait velocity: decr Gait velocity interpretation: 1.31 - 2.62 ft/sec, indicative of limited community ambulator General Gait Details: Loss of balance x 1 requiring min assist to correct. Dyspnea 2/4   Stairs             Wheelchair Mobility    Modified Rankin (Stroke Patients Only)       Balance Overall balance assessment: Needs assistance Sitting-balance support: No upper extremity supported;Feet supported Sitting balance-Leahy Scale: Good     Standing balance support: Bilateral upper  extremity supported Standing balance-Leahy Scale: Poor Standing balance comment: walker and supervision for static standing                            Cognition Arousal/Alertness: Awake/alert Behavior During Therapy: WFL for tasks assessed/performed Overall Cognitive Status: Impaired/Different from baseline Area of Impairment: Memory                     Memory: Decreased short-term memory                Exercises      General Comments        Pertinent Vitals/Pain Pain Assessment: Faces Faces Pain Scale: Hurts a little bit Pain Location: rt hip Pain Descriptors / Indicators: Sore    Home Living Family/patient expects to be discharged to:: Private residence Living Arrangements: Alone Available Help at Discharge: Family;Available 24 hours/day Type of Home: House Home Access: Stairs to enter Entrance Stairs-Rails: Right;Left Home Layout: Able to live on main level with bedroom/bathroom Home Equipment: Walker - 2 wheels;Bedside commode;Cane - single point      Prior Function Level of Independence: Independent      Comments: did briefly go home after hip sx, has been having to use RW and per son needing help at home with mobility and RW   PT Goals (current goals can now be found in the care plan section) Acute Rehab PT Goals Patient Stated Goal: regain independence Progress towards PT goals: Progressing toward goals  Frequency    Min 3X/week      PT Plan Discharge plan needs to be updated    Co-evaluation              AM-PAC PT "6 Clicks" Mobility   Outcome Measure  Help needed turning from your back to your side while in a flat bed without using bedrails?: None Help needed moving from lying on your back to sitting on the side of a flat bed without using bedrails?: None Help needed moving to and from a bed to a chair (including a wheelchair)?: A Little Help needed standing up from a chair using your arms (e.g., wheelchair or  bedside chair)?: A Little Help needed to walk in hospital room?: A Little Help needed climbing 3-5 steps with a railing? : A Little 6 Click Score: 20    End of Session   Activity Tolerance: Patient tolerated treatment well Patient left: with call bell/phone within reach;in chair;with family/visitor present   PT Visit Diagnosis: Unsteadiness on feet (R26.81);Difficulty in walking, not elsewhere classified (R26.2)     Time: 8756-4332 PT Time Calculation (min) (ACUTE ONLY): 16 min  Charges:  $Gait Training: 8-22 mins                     [employer] Pager [phone] Office [place] 05/09/2020, 12:47 PM

## 2020-05-09 NOTE — Progress Notes (Signed)
RE: Patricia Garcia. [name]  Date of Birth: 30-May-1933  Date: 05/09/2020  To Whom It May Concern:  Please be advised that the above-named patient will require a short-term nursing home stay - anticipated 30 days or less for rehabilitation and strengthening. The plan is for return home.

## 2020-05-09 NOTE — TOC Initial Note (Addendum)
Transition of Care [facility]) - Initial/Assessment Note    Patient Details  Name: Patricia Garcia MRN: [id number] Date of Birth: [birth year]  Transition of Care [facility]) CM/SW Contact:    Trula Ore, [place] Phone Number: 05/09/2020, 2:56 PM  Clinical Narrative:                  CSW received consult for possible SNF placement at time of discharge. CSW spoke with patient and patients son [name] at bedside regarding PT recommendation of SNF placement at time of discharge. Patient comes from home alone.Patient expressed understanding of PT recommendation and is agreeable to SNF placement at time of discharge. Patient reports preference for [facility]. Patient and patients son wants CSW to also fax out initial referral to [employer] and [place] area. Patient has  received her first COVID vaccine and would like to receive her second one. CSW informed MD of patients request for second covid vaccine.No further questions reported at this time. CSW to continue to follow and assist with discharge planning needs.   Expected Discharge Plan: Skilled Nursing Facility Barriers to Discharge: Continued Medical Work up   Patient Goals and CMS Choice Patient states their goals for this hospitalization and ongoing recovery are:: to go to SNF CMS Medicare.gov Compare Post Acute Care list provided to:: Patient Choice offered to / list presented to : Patient  Expected Discharge Plan and Services Expected Discharge Plan: [place] In-house Referral: Clinical Social Work Discharge Planning Services: CM Consult Post Acute Care Choice: [place] arrangements for the past 2 months: [place] Expected Discharge Date: 05/09/20               DME Arranged: N/A DME Agency: NA                  Prior Living Arrangements/Services Living arrangements for the past 2 months: Single Family Home Lives with:: Self Patient language and need for interpreter reviewed::  Yes Do you feel safe going back to the place where you live?: No   SNF  Need for Family Participation in Patient Care: Yes (Comment) Care giver support system in place?: Yes (comment) Current home services: Home PT, Home OT (Patient was previously active with [facility] for [facility].) Criminal Activity/Legal Involvement Pertinent to Current Situation/Hospitalization: No - Comment as needed  Activities of Daily Living Home Assistive Devices/Equipment: Eyeglasses, Hearing aid, Environmental consultant (specify type) ADL Screening (condition at time of admission) Patient's cognitive ability adequate to safely complete daily activities?: Yes Is the patient deaf or have difficulty hearing?: Yes Does the patient have difficulty seeing, even when wearing glasses/contacts?: No Does the patient have difficulty concentrating, remembering, or making decisions?: No Patient able to express need for assistance with ADLs?: Yes Does the patient have difficulty dressing or bathing?: Yes Independently performs ADLs?: No Communication: Independent Dressing (OT): Needs assistance Is this a change from baseline?: Change from baseline, expected to last >3 days Grooming: Independent Feeding: Independent Bathing: Needs assistance Is this a change from baseline?: Change from baseline, expected to last >3 days Toileting: Needs assistance Is this a change from baseline?: Change from baseline, expected to last >3days In/Out Bed: Needs assistance Is this a change from baseline?: Change from baseline, expected to last >3 days Walks in Home: Dependent Is this a change from baseline?: Change from baseline, expected to last >3 days Does the patient have difficulty walking or climbing stairs?: Yes Weakness of Legs: Right Weakness of Arms/Hands: None  Permission  Sought/Granted Permission sought to share information with : Case Manager, Family Supports, Chartered certified accountant granted to share information with :  Yes, Verbal Permission Granted  Share Information with NAME: [name]  Permission granted to share info w AGENCY: SNF  Permission granted to share info w Relationship: son  Permission granted to share info w Contact Information: [name] [phone]  Emotional Assessment Appearance:: Appears stated age Attitude/Demeanor/Rapport: Gracious Affect (typically observed): Calm Orientation: : Oriented to Self, Oriented to Place, Oriented to  Time, Oriented to Situation Alcohol / Substance Use: Not Applicable Psych Involvement: No (comment)  Admission diagnosis:  Acute CHF (congestive heart failure) ([place]) [I50.9] Acute on chronic congestive heart failure, unspecified heart failure type [facility]) [I50.9] Patient Active Problem List   Diagnosis Date Noted  . NSTEMI (non-ST elevated myocardial infarction) ([place]) 05/07/2020  . Postoperative anemia 05/07/2020  . Acute exacerbation of CHF (congestive heart failure) ([place]) 05/06/2020  . Acute respiratory failure with hypoxia ([place]) 05/06/2020  . LBBB (left bundle branch block) 05/06/2020  . Sinus congestion 05/06/2020  . Femur fracture, right ([place]) 04/30/2020  . Chronic abdominal pain 09/23/2011  . Gas pain 09/23/2011  . GERD (gastroesophageal reflux disease) 09/23/2011  . Constipation, slow transit 09/23/2011  . Anal or rectal pain 09/23/2011  . GASTRITIS 06/26/2010  . Constipation 06/21/2010  . RECTAL BLEEDING 06/21/2010  . VITAMIN D DEFICIENCY 06/20/2010  . Anxiety state 06/20/2010  . GLAUCOMA 06/20/2010  . CATARACTS 06/20/2010  . HYPERTENSION, BENIGN 06/20/2010  . SINUSITIS, RECURRENT 06/20/2010  . GERD 06/20/2010  . Disorder of bone and cartilage 06/20/2010  . ABDOMINAL PAIN 06/20/2010  . MICROALBUMINURIA 06/20/2010   PCP:  Lajean Manes, MD Pharmacy:   [id number] [place], [place] [address] Phone: [phone] Fax: [phone]  [facility] [place] [place] [address] [place] [address] Phone: [phone] Fax: [phone]     Social Determinants of Health (SDOH) Interventions    Readmission Risk Interventions Readmission Risk Prevention Plan 05/09/2020  Transportation Screening Complete  PCP or Specialist Appt within 3-5 Days Complete  HRI or [place] Complete  Social Work Consult for [place] Planning/Counseling Complete  Medication Review Press photographer) Complete  Some recent data might be hidden

## 2020-05-09 NOTE — Progress Notes (Signed)
Progress Note  Patient Name: Patricia Garcia Date of Encounter: 05/09/2020  [facility] Cardiologist: Freada Bergeron, MD   Subjective   No events overnight. She is eating breakfast with her son this morning. She is worried about going home as she is having difficulty with mobility due to recent hip replacement and would like to be evaluated by PT/OT.  Inpatient Medications    Scheduled Meds: . sodium chloride   Intravenous Once  . aspirin  81 mg Oral Daily  . cholecalciferol  1,000 Units Oral Daily  . citalopram  20 mg Oral Daily  . cloNIDine  0.1 mg Oral BID  . docusate sodium  100 mg Oral BID  . ezetimibe  10 mg Oral Daily  . famotidine  40 mg Oral Daily  . ferrous sulfate  325 mg Oral Once per day on Mon Wed Fri  . furosemide  20 mg Oral Daily  . heparin  5,000 Units Subcutaneous Q8H  . latanoprost  1 drop Both Eyes QHS  . loratadine  10 mg Oral Daily  . metoprolol tartrate  25 mg Oral BID  . multivitamin with minerals  1 tablet Oral Daily  . omega-3 acid ethyl esters  1,000 mg Oral Daily  . pantoprazole  40 mg Oral Daily  . sodium chloride flush  3 mL Intravenous Q12H  . sodium chloride flush  3 mL Intravenous Q12H  . ticagrelor  90 mg Oral BID   Continuous Infusions: . sodium chloride     PRN Meds: sodium chloride, acetaminophen, HYDROcodone-acetaminophen, hydrOXYzine, metoprolol tartrate, ondansetron (ZOFRAN) IV, senna, sodium chloride, sodium chloride flush   Vital Signs    Vitals:   05/08/20 2200 05/09/20 0018 05/09/20 0330 05/09/20 0749  BP: 136/66 (!) 104/51 (!) 108/53 (!) 113/48  Pulse:  69 67 66  Resp:  16 14 15   Temp:  98.1 F (36.7 C) 98 F (36.7 C) 98.5 F (36.9 C)  TempSrc:  Oral Oral Oral  SpO2:  95% 95% 100%  Weight:   62.9 kg   Height:        Intake/Output Summary (Last 24 hours) at 05/09/2020 0930 Last data filed at 05/09/2020 0019 Gross per 24 hour  Intake 880 ml  Output 300 ml  Net 580 ml   Last 3 Weights 05/09/2020  05/08/2020 05/08/2020  Weight (lbs) 138 lb 10.7 oz 134 lb 7.7 oz 134 lb 7.7 oz  Weight (kg) 62.9 kg 61 kg 61 kg      Telemetry    NSR with HR 70s. - Personally Reviewed  ECG    SR, LBBB, LAD. - Personally Reviewed  Physical Exam   GEN: Comfortable, eating breakfast Neck: Supple. No JVD. Cardiac: RRR, no murmurs Respiratory: CTAB GI: Soft, ND, +BS MS: Warm, no edema. Skin: Warm and dry. Neuro:  No focal deficits. Psych: Normal affect   Labs    High Sensitivity Troponin:   Recent Labs  Lab 05/06/20 1859 05/06/20 2148 05/07/20 0524 05/07/20 0725  TROPONINIHS 2,365* 3,297* 3,515* 3,026*      Chemistry Recent Labs  Lab 05/06/20 1106 05/06/20 1859 05/07/20 0524 05/07/20 0524 05/08/20 0457 05/08/20 1832 05/09/20 0259  NA 137  --  138  --  138  --  134*  K 3.3*  --  3.2*  --  4.2  --  3.6  CL 102  --  100  --  98  --  96*  CO2 24  --  28  --  26  --  29  GLUCOSE 122*  --  118*  --  123*  --  112*  BUN 12  --  11  --  16  --  14  CREATININE 0.70   < > 0.88   < > 1.04* 1.05* 1.02*  CALCIUM 8.1*  --  8.1*  --  8.6*  --  8.2*  PROT 5.8*  --   --   --   --   --   --   ALBUMIN 2.7*  --   --   --   --   --   --   AST 60*  --   --   --   --   --   --   ALT 24  --   --   --   --   --   --   ALKPHOS 49  --   --   --   --   --   --   BILITOT 0.6  --   --   --   --   --   --   GFRNONAA >60   < > 60*   < > 49* 48* 50*  ANIONGAP 11  --  10  --  14  --  9   < > = values in this interval not displayed.     Hematology Recent Labs  Lab 05/08/20 0457 05/08/20 1832 05/09/20 0259  WBC 9.8 10.2 9.8  RBC 3.29* 3.47* 3.00*  HGB 9.6* 9.8* 8.5*  HCT 29.5* 30.3* 26.6*  MCV 89.7 87.3 88.7  MCH 29.2 28.2 28.3  MCHC 32.5 32.3 32.0  RDW 14.2 14.3 14.4  PLT 312 345 315    BNP Recent Labs  Lab 05/06/20 1413  BNP 1,221.6*     DDimer No results for input(s): DDIMER in the last 168 hours.   Radiology    CARDIAC CATHETERIZATION  Result Date: 05/08/2020  1st Diag  lesion is 85% stenosed.  Ramus lesion is 30% stenosed.  Prox Cx to Mid Cx lesion is 90% stenosed.  Prox RCA to Mid RCA lesion is 95% stenosed.  Post intervention, there is a 0% residual stenosis.  A drug-eluting stent was successfully placed using a STENT RESOLUTE ONYX 3.0X15.  Post intervention, there is a 0% residual stenosis.  A drug-eluting stent was successfully placed using a [place] 2.25X15.  LV end diastolic pressure is mildly elevated.  1. 3 vessel obstructive CAD    - 95% mid RCA- this appeared to be the culprit lesion    - 85% proxima first diagonal    - 90% very small LCx in the AV groove 2. Mildly elevated LVEDP 3. Successful PCI of the mid RCA with DES x 1 4. Successful PCI of the first diagonal with DES x 1 Plan: DAPT for one year. Will transition lasix to po. Further plans per primary team.   ECHOCARDIOGRAM COMPLETE  Result Date: 05/07/2020    ECHOCARDIOGRAM REPORT   Patient Name:   Patricia Garcia Date of Exam: 05/07/2020 Medical Rec #:  [id number]        Height:       63.0 in Accession #:    [id number]       Weight:       135.6 lb Date of Birth:  Feb 18, 1933        BSA:          1.639 m Patient Age:    86 years  BP:           123/58 mmHg Patient Gender: F                HR:           82 bpm. Exam Location:  Inpatient Procedure: 2D Echo, Cardiac Doppler and Color Doppler Indications:    Dyspnea  History:        Patient has no prior history of Echocardiogram examinations.                 Cardiomyopathy, Arrythmias:LBBB, Signs/Symptoms:Dyspnea and                 Pleurall effusion; Risk Factors:Hypertension. Elevated Troponin,                 s/p hip replacement.  Sonographer:    Dustin Flock Referring Phys: [id number] [place]  1. Hypokinesis of the basal inferior and inferolateral wall with overall preserved LV function.  2. Left ventricular ejection fraction, by estimation, is 50 to 55%. The left ventricle has low normal function. The left ventricle  demonstrates regional wall motion abnormalities (see scoring diagram/findings for description). Left ventricular diastolic  parameters are consistent with Grade II diastolic dysfunction (pseudonormalization). Elevated left atrial pressure.  3. Right ventricular systolic function is normal. The right ventricular size is normal. There is mildly elevated pulmonary artery systolic pressure.  4. Moderate pleural effusion in the left lateral region.  5. The mitral valve is normal in structure. Mild mitral valve regurgitation. No evidence of mitral stenosis.  6. The aortic valve is tricuspid. Aortic valve regurgitation is trivial. No aortic stenosis is present.  7. The inferior vena cava is normal in size with greater than 50% respiratory variability, suggesting right atrial pressure of 3 mmHg. FINDINGS  Left Ventricle: Left ventricular ejection fraction, by estimation, is 50 to 55%. The left ventricle has low normal function. The left ventricle demonstrates regional wall motion abnormalities. The left ventricular internal cavity size was normal in size. There is no left ventricular hypertrophy. Left ventricular diastolic parameters are consistent with Grade II diastolic dysfunction (pseudonormalization). Elevated left atrial pressure. Right Ventricle: The right ventricular size is normal.Right ventricular systolic function is normal. There is mildly elevated pulmonary artery systolic pressure. The tricuspid regurgitant velocity is 3.08 m/s, and with an assumed right atrial pressure of  3 mmHg, the estimated right ventricular systolic pressure is 84.6 mmHg. Left Atrium: Left atrial size was normal in size. Right Atrium: Right atrial size was normal in size. Pericardium: There is no evidence of pericardial effusion. Mitral Valve: The mitral valve is normal in structure. Mild mitral annular calcification. Mild mitral valve regurgitation. No evidence of mitral valve stenosis. Tricuspid Valve: The tricuspid valve is normal in  structure. Tricuspid valve regurgitation is mild . No evidence of tricuspid stenosis. Aortic Valve: The aortic valve is tricuspid. Aortic valve regurgitation is trivial. No aortic stenosis is present. Pulmonic Valve: The pulmonic valve was normal in structure. Pulmonic valve regurgitation is trivial. No evidence of pulmonic stenosis. Aorta: The aortic root is normal in size and structure. Venous: The inferior vena cava is normal in size with greater than 50% respiratory variability, suggesting right atrial pressure of 3 mmHg.  Additional Comments: Hypokinesis of the basal inferior and inferolateral wall with overall preserved LV function. There is a moderate pleural effusion in the left lateral region.  LEFT VENTRICLE PLAX 2D LVIDd:         4.70 cm  Diastology  LVIDs:         3.30 cm  LV e' medial:    5.77 cm/s LV PW:         1.10 cm  LV E/e' medial:  18.2 LV IVS:        1.10 cm  LV e' lateral:   8.70 cm/s LVOT diam:     2.00 cm  LV E/e' lateral: 12.1 LV SV:         56 LV SV Index:   34 LVOT Area:     3.14 cm  RIGHT VENTRICLE RV Basal diam:  3.20 cm RV S prime:     8.16 cm/s TAPSE (M-mode): 2.1 cm LEFT ATRIUM             Index       RIGHT ATRIUM           Index LA diam:        4.00 cm 2.44 cm/m  RA Area:     13.20 cm LA Vol (A2C):   32.6 ml 19.89 ml/m RA Volume:   31.10 ml  18.97 ml/m LA Vol (A4C):   53.9 ml 32.88 ml/m LA Biplane Vol: 42.2 ml 25.74 ml/m  AORTIC VALVE LVOT Vmax:   100.00 cm/s LVOT Vmean:  76.800 cm/s LVOT VTI:    0.178 m  AORTA Ao Root diam: 2.40 cm MITRAL VALVE                TRICUSPID VALVE MV Area (PHT): 5.75 cm     TR Peak grad:   37.9 mmHg MV Decel Time: 132 msec     TR Vmax:        308.00 cm/s MV E velocity: 105.00 cm/s MV A velocity: 96.00 cm/s   SHUNTS MV E/A ratio:  1.09         Systemic VTI:  0.18 m                             Systemic Diam: 2.00 cm Kirk Ruths MD Electronically signed by Kirk Ruths MD Signature Date/Time: 05/07/2020/12:45:14 PM    Final     Cardiac Studies    Echocardiogram 05/07/2020: Impressions: 1. Hypokinesis of the basal inferior and inferolateral wall with overall  preserved LV function.  2. Left ventricular ejection fraction, by estimation, is 50 to 55%. The  left ventricle has low normal function. The left ventricle demonstrates  regional wall motion abnormalities (see scoring diagram/findings for  description). Left ventricular diastolic  parameters are consistent with Grade II diastolic dysfunction  (pseudonormalization). Elevated left atrial pressure.  3. Right ventricular systolic function is normal. The right ventricular  size is normal. There is mildly elevated pulmonary artery systolic  pressure.  4. Moderate pleural effusion in the left lateral region.  5. The mitral valve is normal in structure. Mild mitral valve  regurgitation. No evidence of mitral stenosis.  6. The aortic valve is tricuspid. Aortic valve regurgitation is trivial.  No aortic stenosis is present.  7. The inferior vena cava is normal in size with greater than 50%  respiratory variability, suggesting right atrial pressure of 3 mmHg.  LHC 05/08/20:  1st Diag lesion is 85% stenosed.  Ramus lesion is 30% stenosed.  Prox Cx to Mid Cx lesion is 90% stenosed.  Prox RCA to Mid RCA lesion is 95% stenosed.  Post intervention, there is a 0% residual stenosis.  A drug-eluting stent was successfully placed using a STENT  RESOLUTE ONYX 3.0X15.  Post intervention, there is a 0% residual stenosis.  A drug-eluting stent was successfully placed using a [place] 2.25X15.  LV end diastolic pressure is mildly elevated.   1. 3 vessel obstructive CAD    - 95% mid RCA- this appeared to be the culprit lesion    - 85% proxima first diagonal    - 90% very small LCx in the AV groove 2. Mildly elevated LVEDP 3. Successful PCI of the mid RCA with DES x 1 4. Successful PCI of the first diagonal with DES x 1  Plan: DAPT for one year. Will transition  lasix to po. Further plans per primary team.   Patient Profile     86 y.o. female LBBB, hypertension, depression, and a recent right femur fracture s/p total hip arthroplasty on 05/02/2020 who presented with NSTEMI and CHF now s/p PCI to RCA (culprit) and D1 doing well.  Assessment & Plan    #NSTEMI: -S/p successful PCI to RCA and D1 on 05/08/20 -Will need aggressive medical therapy as has multivessel CAD -TTE with LVEF of 50-55% with hypokinesis of the basal inferior and inferolateral wall, grade 2 diastolic dysfunction, moderate left pleural effusion, and mildly elevated PASP. -Continue ASA 81mg  daily and ticagrelor 90mg  BID-->will need for at least 1 year -Change metoprolol to 25mg  XL daily -Has allergy to statins, continue zetia and will likely need PCSK9i as out-patient -Cardiac rehab upon discharge  #Acute Hypoxic Respiratory Failure Secondary to Acute on Chronic Diastolic CHF: Volume status significantly improved and now appears euvolemic. -Continue PO lasix 20mg  daily -Continue metoprolol 25mg  XL -Would benefit from ACE/ARB-->will add as out-patient as Bps on softer side -Monitor I/Os and daily weights -Low Na diet  Hypertension: BP well controlled. - Continue home Clonidine. Patient states she has been taking this for years. - Change to metop 25mg  XL daily - Add ACE/ARB as able as out-patient  Normocytic Anemia: Stable. -Received 1unit pre-cath; hemoglobin stable at 8.5 with no evidence of bleed -Iron supplementation  -Continue to monitor  Recent hip replacement Gait instability: -PT/OT consult  For questions or updates, please contact [facility] Please consult [email or web address] for contact info under     Signed, Freada Bergeron, MD  05/09/2020, 9:30 AM

## 2020-05-09 NOTE — Evaluation (Signed)
Occupational Therapy Evaluation Patient Details Name: Patricia Garcia MRN: [id number] DOB: 15-Feb-1933 Today's Date: 05/09/2020    History of Present Illness 86 yo female admitted with acute CHF, elevated troponin. Pt with NSTEMI and transferred to [facility]. Pt underwent cardiac cath 10/19 and found to have severe CAD and stents placed. Recent R hip fx s/p R THA-direct anterior 05/03/2020. Hx of LBBB, fall, osteopenia   Clinical Impression   PTA pt living alone, functioning at independent level. She did have recent THA (10/11) and d/c home, had NSTEMI and was readmitted to [facility]. At time of eval, pt is able to complete bed mobility with min guard assist and sit <> stands with min guard-min A with RW for balance. Pt able to complete functional mobility to bathroom for toilet transfer. She then was set up at the sink for bathing task. Noted most difficulty with manipulating RLE in ADL. Pt current requires min A for LB bathing and mod A for LB dressing. Given current status, recommend SNF at d/c for continued ADL progression. Will continue to follow per POC listed below.     Follow Up Recommendations  SNF    Equipment Recommendations  3 in 1 bedside commode    Recommendations for Other Services       Precautions / Restrictions Precautions Precautions: Fall Restrictions Weight Bearing Restrictions: Yes RLE Weight Bearing: Weight bearing as tolerated      Mobility Bed Mobility Overal bed mobility: Needs Assistance Bed Mobility: Supine to Sit     Supine to sit: Min guard     General bed mobility comments: Pt up in chair    Transfers Overall transfer level: Needs assistance Equipment used: Rolling walker (2 wheeled) Transfers: Sit to/from Stand Sit to Stand: Min guard;Min assist         General transfer comment: occasional min A to steady balance    Balance Overall balance assessment: Needs assistance Sitting-balance support: No upper extremity supported;Feet  supported Sitting balance-Leahy Scale: Good     Standing balance support: Bilateral upper extremity supported;During functional activity Standing balance-Leahy Scale: Poor Standing balance comment: requires UE support for majority of standing tasks. Was able to remove hand briefly for standing peri care, but had to keep hanging onto sink for stability                           ADL either performed or assessed with clinical judgement   ADL Overall ADL's : Needs assistance/impaired Eating/Feeding: Set up;Sitting   Grooming: Supervision/safety;Standing   Upper Body Bathing: Set up;Sitting Upper Body Bathing Details (indicate cue type and reason): set up at sink for bath Lower Body Bathing: Minimal assistance;Sit to/from stand;Sitting/lateral leans Lower Body Bathing Details (indicate cue type and reason): mostly for RLE management Upper Body Dressing : Set up;Sitting   Lower Body Dressing: Moderate assistance;Sit to/from stand;Sitting/lateral leans Lower Body Dressing Details (indicate cue type and reason): assist to thread RLE through mesh underwear Toilet Transfer: Min guard;Ambulation;RW Toilet Transfer Details (indicate cue type and reason): able to complete functional mobility into bathroom for toilet transfer [place] and Hygiene: Set up;Sitting/lateral lean       Functional mobility during ADLs: Min guard;Rolling walker;Cueing for safety General ADL Comments: pt was set up at sink for bathing task this session, mostly requires assist due to RLE pain and difficulty manipulating     Vision Baseline Vision/History: Wears glasses Wears Glasses: At all times Patient Visual Report: No change from  baseline       Perception     Praxis      Pertinent Vitals/Pain Pain Assessment: Faces Faces Pain Scale: Hurts a little bit Pain Location: rt hip Pain Descriptors / Indicators: Aching Pain Intervention(s): Limited activity within patient's  tolerance;Monitored during session;Repositioned     Hand Dominance     Extremity/Trunk Assessment Upper Extremity Assessment Upper Extremity Assessment: Generalized weakness   Lower Extremity Assessment Lower Extremity Assessment: Defer to PT evaluation       Communication     Cognition Arousal/Alertness: Awake/alert Behavior During Therapy: WFL for tasks assessed/performed Overall Cognitive Status: Impaired/Different from baseline Area of Impairment: Memory;Problem solving                     Memory: Decreased short-term memory       Problem Solving: Slow processing;Requires tactile cues General Comments: overall STM deficits noted- not able to recall conversation with dr from previous this date. Requires increased cues to problem solve basic ADL tasks   General Comments       Exercises     Shoulder Instructions      Home Living                                          Prior Functioning/Environment                   OT Problem List: Decreased strength;Decreased knowledge of use of DME or AE;Decreased knowledge of precautions;Decreased activity tolerance;Decreased cognition;Impaired balance (sitting and/or standing);Decreased safety awareness;Pain      OT Treatment/Interventions: Self-care/ADL training;Therapeutic exercise;Patient/family education;Balance training;Energy conservation;Therapeutic activities;DME and/or AE instruction    OT Goals(Current goals can be found in the care plan section) Acute Rehab OT Goals Patient Stated Goal: regain independence OT Goal Formulation: With patient Time For Goal Achievement: 05/23/20 Potential to Achieve Goals: Good  OT Frequency: Min 2X/week   Barriers to D/C:            Co-evaluation              AM-PAC OT "6 Clicks" Daily Activity     Outcome Measure Help from another person eating meals?: None Help from another person taking care of personal grooming?: A Little Help  from another person toileting, which includes using toliet, bedpan, or urinal?: A Little Help from another person bathing (including washing, rinsing, drying)?: A Little Help from another person to put on and taking off regular upper body clothing?: None Help from another person to put on and taking off regular lower body clothing?: A Lot 6 Click Score: 19   End of Session Equipment Utilized During Treatment: Gait belt;Rolling walker Nurse Communication: Mobility status  Activity Tolerance: Patient tolerated treatment well Patient left: in chair;with call bell/phone within reach  OT Visit Diagnosis: Unsteadiness on feet (R26.81);Other abnormalities of gait and mobility (R26.89);Muscle weakness (generalized) (M62.81)                Time: 7096-2836 OT Time Calculation (min): 52 min Charges:  OT General Charges $OT Visit: 1 Visit OT Evaluation $OT Eval Moderate Complexity: 1 Mod OT Treatments $Self Care/Home Management : 23-37 mins  Zenovia Jarred, MSOT, OTR/L Acute Rehabilitation Services [facility] Office Number: [phone] Pager: [phone]  Zenovia Jarred 05/09/2020, 1:47 PM

## 2020-05-09 NOTE — NC FL2 (Signed)
[place] MEDICAID FL2 LEVEL OF CARE SCREENING TOOL     IDENTIFICATION  Patient Name: Patricia Garcia Birthdate: May 26, 1933 Sex: female Admission Date (Current Location): 05/06/2020  [facility] and [place] Number:  Herbalist and Address:  [facility] [place], [place] [place], [place] [address]      Provider Number: [id number]  Attending Physician Name and Address:  Mercy Riding, MD  Relative Name and Phone Number:  [name] [phone]    Current Level of Care: Hospital Recommended Level of Care: [place] Prior Approval Number:    Date Approved/Denied:   PASRR Number: PASRR under review  Discharge Plan: SNF    Current Diagnoses: Patient Active Problem List   Diagnosis Date Noted  . NSTEMI (non-ST elevated myocardial infarction) ([place]) 05/07/2020  . Postoperative anemia 05/07/2020  . Acute exacerbation of CHF (congestive heart failure) ([place]) 05/06/2020  . Acute respiratory failure with hypoxia ([place]) 05/06/2020  . LBBB (left bundle branch block) 05/06/2020  . Sinus congestion 05/06/2020  . Femur fracture, right ([place]) 04/30/2020  . Chronic abdominal pain 09/23/2011  . Gas pain 09/23/2011  . GERD (gastroesophageal reflux disease) 09/23/2011  . Constipation, slow transit 09/23/2011  . Anal or rectal pain 09/23/2011  . GASTRITIS 06/26/2010  . Constipation 06/21/2010  . RECTAL BLEEDING 06/21/2010  . VITAMIN D DEFICIENCY 06/20/2010  . Anxiety state 06/20/2010  . GLAUCOMA 06/20/2010  . CATARACTS 06/20/2010  . HYPERTENSION, BENIGN 06/20/2010  . SINUSITIS, RECURRENT 06/20/2010  . GERD 06/20/2010  . Disorder of bone and cartilage 06/20/2010  . ABDOMINAL PAIN 06/20/2010  . MICROALBUMINURIA 06/20/2010    Orientation RESPIRATION BLADDER Height & Weight     Self, Time, Situation, Place  Normal Incontinent, External catheter (External Urinary Catheter) Weight: 138 lb 10.7 oz (62.9 kg) Height:  5\' 3"  (160 cm)  BEHAVIORAL  SYMPTOMS/MOOD NEUROLOGICAL BOWEL NUTRITION STATUS      Continent (WDL) Diet (See Discharge Summary)  AMBULATORY STATUS COMMUNICATION OF NEEDS Skin   Limited Assist Verbally Normal                       Personal Care Assistance Level of Assistance  Bathing, Feeding, Dressing Bathing Assistance: Limited assistance Feeding assistance: Independent (NPO) Dressing Assistance: Limited assistance     Functional Limitations Info  Sight, Hearing, Speech Sight Info: Adequate (wears glasses) Hearing Info: Impaired Speech Info: Adequate    SPECIAL CARE FACTORS FREQUENCY  PT (By licensed PT), OT (By licensed OT)     PT Frequency: 5x min weekly OT Frequency: 5x min weekly            Contractures Contractures Info: Not present    Additional Factors Info  Code Status, Allergies, Psychotropic Code Status Info: FULL Allergies Info: Penicillins,Prednisone,Statins,Sulfonamide Derivatives,Tramadol Psychotropic Info: citalopram (CELEXA) tablet 20 mg,         Current Medications (05/09/2020):  This is the current hospital active medication list Current Facility-Administered Medications  Medication Dose Route Frequency Provider Last Rate Last Admin  . 0.9 %  sodium chloride infusion (Manually program via Guardrails IV Fluids)   Intravenous Once Martinique, Peter M, MD      . 0.9 %  sodium chloride infusion  250 mL Intravenous PRN Martinique, Peter M, MD      . acetaminophen (TYLENOL) tablet 650 mg  650 mg Oral Q4H PRN Martinique, Peter M, MD      . aspirin chewable tablet 81 mg  81 mg Oral Daily  Martinique, Peter M, MD   81 mg at 05/09/20 1100  . cholecalciferol (VITAMIN D3) tablet 1,000 Units  1,000 Units Oral Daily Martinique, Peter M, MD   1,000 Units at 05/09/20 1100  . citalopram (CELEXA) tablet 20 mg  20 mg Oral Daily Martinique, Peter M, MD   20 mg at 05/09/20 1100  . cloNIDine (CATAPRES) tablet 0.1 mg  0.1 mg Oral BID Martinique, Peter M, MD   0.1 mg at 05/09/20 1100  . docusate sodium (COLACE) capsule  100 mg  100 mg Oral BID Martinique, Peter M, MD   100 mg at 05/09/20 1100  . ezetimibe (ZETIA) tablet 10 mg  10 mg Oral Daily Martinique, Peter M, MD   10 mg at 05/09/20 1100  . famotidine (PEPCID) tablet 40 mg  40 mg Oral Daily Martinique, Peter M, MD   40 mg at 05/09/20 1100  . ferrous sulfate tablet 325 mg  325 mg Oral Once per day on Mon Wed Fri Martinique, Peter M, MD   325 mg at 05/09/20 1130  . furosemide (LASIX) tablet 20 mg  20 mg Oral Daily Martinique, Peter M, MD   20 mg at 05/09/20 1100  . heparin injection 5,000 Units  5,000 Units Subcutaneous Q8H Martinique, Peter M, MD   5,000 Units at 05/09/20 0542  . HYDROcodone-acetaminophen (NORCO/VICODIN) 5-325 MG per tablet 1-2 tablet  1-2 tablet Oral Q4H PRN Martinique, Peter M, MD      . hydrOXYzine (ATARAX/VISTARIL) tablet 10 mg  10 mg Oral TID PRN Martinique, Peter M, MD   10 mg at 05/08/20 1825  . latanoprost (XALATAN) 0.005 % ophthalmic solution 1 drop  1 drop Both Eyes QHS Martinique, Peter M, MD   1 drop at 05/08/20 2110  . loratadine (CLARITIN) tablet 10 mg  10 mg Oral Daily Martinique, Peter M, MD   10 mg at 05/09/20 1100  . metoprolol succinate (TOPROL-XL) 24 hr tablet 25 mg  25 mg Oral Daily Freada Bergeron, MD   25 mg at 05/09/20 1130  . multivitamin with minerals tablet 1 tablet  1 tablet Oral Daily Martinique, Peter M, MD   1 tablet at 05/09/20 1100  . omega-3 acid ethyl esters (LOVAZA) capsule 1,000 mg  1,000 mg Oral Daily Martinique, Peter M, MD   1,000 mg at 05/09/20 1100  . ondansetron (ZOFRAN) injection 4 mg  4 mg Intravenous Q6H PRN Martinique, Peter M, MD   4 mg at 05/08/20 1832  . pantoprazole (PROTONIX) EC tablet 40 mg  40 mg Oral Daily Martinique, Peter M, MD   40 mg at 05/09/20 1100  . senna (SENOKOT) tablet 8.6 mg  1 tablet Oral Daily PRN Martinique, Peter M, MD      . sodium chloride (MURO 128) 2 % ophthalmic solution 1 drop  1 drop Both Eyes BID PRN Martinique, Peter M, MD      . sodium chloride flush (NS) 0.9 % injection 3 mL  3 mL Intravenous Q12H Martinique, Peter M, MD   3  mL at 05/09/20 1130  . sodium chloride flush (NS) 0.9 % injection 3 mL  3 mL Intravenous Q12H Martinique, Peter M, MD   3 mL at 05/09/20 1100  . sodium chloride flush (NS) 0.9 % injection 3 mL  3 mL Intravenous PRN [place]ique, Peter M, MD      . ticagrelor [facility]) tablet 90 mg  90 mg Oral BID Martinique, Peter M, MD   90 mg at 05/09/20 1100  Discharge Medications: Please see discharge summary for a list of discharge medications.  Relevant Imaging Results:  Relevant Lab Results:   Additional Information [phone]  Trula Ore, LCSWA

## 2020-05-09 NOTE — Progress Notes (Signed)
[id number] MI education completed with pt and son who voiced understanding. Son will reinforce ed with mother. Stressed importance of brilinta with stent several times. Discussed NTG use, MI restrictions, watching sodium and gave heart healthy diet. Will let PT give walking instructions and see pt to walk due to recent femur surgery. Son stated more difficulty at home than anticipated after surgery and would like to talk to PT/SW about possible rehab. Notified PT to please address this concern with son and pt. Both very interested in CRP 2 [place]. Son stated his father attended at one time. Both are aware that pt has to be healed from surgery and no longer needing PT to be eligible to attend.  Referral made to [place]. Graylon Good RN BSN 05/09/2020 9:29 AM

## 2020-05-09 NOTE — TOC Transition Note (Signed)
Transition of Care [facility]) - CM/SW Discharge Note   Patient Details  Name: Patricia Garcia MRN: [id number] Date of Birth: [birth year]  [facility] CM/SW Contact:  Bethena Roys, RN Phone Number: 05/09/2020, 1:19 PM   Clinical Narrative: Risk for readmission assessment completed. Prior to hospitalization, patient was from home alone. Son checks in often with patient. Pt was previously active with [facility] for [facility]- son wants to explore SNF and patient is agreeable until she gets a little more stable. Clinical Social Worker to offer choice. Case Manager will continue to follow for additional transition of care needs.     Final next level of care: Skilled Nursing Facility Barriers to Discharge: Continued Medical Work up   Patient Goals and CMS Choice Patient states their goals for this hospitalization and ongoing recovery are:: Plan for SNF once stable. CMS Medicare.gov Compare Post Acute Care list provided to:: Patient Choice offered to / list presented to : Adult Children, Patient   Discharge Plan and Services In-house Referral: Clinical Social Work Discharge Planning Services: CM Consult Post Acute Care Choice: [place]          DME Arranged: N/A DME Agency: NA       Readmission Risk Interventions Readmission Risk Prevention Plan 05/09/2020  Transportation Screening Complete  PCP or Specialist Appt within 3-5 Days Complete  HRI or Home Care Consult Complete  Social Work Consult for [place] Planning/Counseling Complete  Medication Review Press photographer) Complete  Some recent data might be hidden

## 2020-05-10 ENCOUNTER — Inpatient Hospital Stay: Payer: Medicare Other

## 2020-05-10 DIAGNOSIS — N179 Acute kidney failure, unspecified: Secondary | ICD-10-CM

## 2020-05-10 DIAGNOSIS — Z23 Encounter for immunization: Secondary | ICD-10-CM

## 2020-05-10 DIAGNOSIS — S72041D Displaced fracture of base of neck of right femur, subsequent encounter for closed fracture with routine healing: Secondary | ICD-10-CM | POA: Diagnosis not present

## 2020-05-10 DIAGNOSIS — I5033 Acute on chronic diastolic (congestive) heart failure: Secondary | ICD-10-CM | POA: Diagnosis not present

## 2020-05-10 DIAGNOSIS — I10 Essential (primary) hypertension: Secondary | ICD-10-CM | POA: Diagnosis not present

## 2020-05-10 DIAGNOSIS — J9601 Acute respiratory failure with hypoxia: Secondary | ICD-10-CM | POA: Diagnosis not present

## 2020-05-10 LAB — RENAL FUNCTION PANEL
Albumin: 2.7 g/dL — ABNORMAL LOW (ref 3.5–5.0)
Anion gap: 11 (ref 5–15)
BUN: 16 mg/dL (ref 8–23)
CO2: 29 mmol/L (ref 22–32)
Calcium: 8.5 mg/dL — ABNORMAL LOW (ref 8.9–10.3)
Chloride: 96 mmol/L — ABNORMAL LOW (ref 98–111)
Creatinine, Ser: 1.28 mg/dL — ABNORMAL HIGH (ref 0.44–1.00)
GFR, Estimated: 38 mL/min — ABNORMAL LOW (ref >60)
Glucose, Bld: 128 mg/dL — ABNORMAL HIGH (ref 70–99)
Phosphorus: 3.5 mg/dL (ref 2.5–4.6)
Potassium: 3.3 mmol/L — ABNORMAL LOW (ref 3.5–5.1)
Sodium: 136 mmol/L (ref 135–145)

## 2020-05-10 LAB — CBC
HCT: 27.8 % — ABNORMAL LOW (ref 36.0–46.0)
Hemoglobin: 8.9 g/dL — ABNORMAL LOW (ref 12.0–15.0)
MCH: 28.7 pg (ref 26.0–34.0)
MCHC: 32 g/dL (ref 30.0–36.0)
MCV: 89.7 fL (ref 80.0–100.0)
Platelets: 361 10*3/uL (ref 150–400)
RBC: 3.1 MIL/uL — ABNORMAL LOW (ref 3.87–5.11)
RDW: 14.1 % (ref 11.5–15.5)
WBC: 8.6 10*3/uL (ref 4.0–10.5)
nRBC: 0 % (ref 0.0–0.2)

## 2020-05-10 LAB — SARS CORONAVIRUS 2 BY RT PCR (HOSPITAL ORDER, PERFORMED IN ~~LOC~~ HOSPITAL LAB): SARS Coronavirus 2: NEGATIVE

## 2020-05-10 LAB — MAGNESIUM: Magnesium: 2.1 mg/dL (ref 1.7–2.4)

## 2020-05-10 MED ORDER — POTASSIUM CHLORIDE CRYS ER 20 MEQ PO TBCR
40.0000 meq | EXTENDED_RELEASE_TABLET | ORAL | Status: AC
  Administered 2020-05-10 (×2): 40 meq via ORAL
  Filled 2020-05-10 (×2): qty 2

## 2020-05-10 MED ORDER — ACETAMINOPHEN 500 MG PO TABS: 500.0000 mg | ORAL_TABLET | Freq: Three times a day (TID) | ORAL | 0 refills | Status: AC | PRN

## 2020-05-10 MED ORDER — METOPROLOL SUCCINATE ER 25 MG PO TB24
25.0000 mg | ORAL_TABLET | Freq: Every day | ORAL | 1 refills | Status: DC
Start: 2020-05-11 — End: 2020-08-01

## 2020-05-10 MED ORDER — EZETIMIBE 10 MG PO TABS
10.0000 mg | ORAL_TABLET | Freq: Every day | ORAL | 1 refills | Status: DC
Start: 2020-05-11 — End: 2020-08-01

## 2020-05-10 MED ORDER — TICAGRELOR 90 MG PO TABS
90.0000 mg | ORAL_TABLET | Freq: Two times a day (BID) | ORAL | 1 refills | Status: DC
Start: 2020-05-10 — End: 2020-08-23

## 2020-05-10 MED FILL — BRILINTA 90 MG TABLET: 90 | 30 days supply | Qty: 60 | Fill #0

## 2020-05-10 MED FILL — METOPROLOL SUCCINATE ER 25: 25 | 30 days supply | Qty: 30 | Fill #0

## 2020-05-10 MED FILL — EZETIMIBE 10 MG TABS: 10 | 30 days supply | Qty: 30 | Fill #0

## 2020-05-10 NOTE — TOC Progression Note (Addendum)
Transition of Care [facility]) - Progression Note    Patient Details  Name: Patricia Garcia MRN: [id number] Date of Birth: Oct 11, 1932  Transition of Care [facility]) CM/SW [place], [place] Phone Number: 05/10/2020, 12:07 PM  Clinical Narrative:     Update 10/21  2:45pm-CSW spoke with patients son Patricia Garcia and patient at bedside. Patient has decided to go home with home health services. CSW let patient and son know that case manager will follow up with them to assist with [facility] services.  CSW will continue to follow.  Patients son Patricia Garcia accepted bed offer with [facility] and rehab. CSW called [facility] and spoke with [place] who confirmed they do not have a bed available today. Nicki says they will have a bed available tomorrow.CSW informed patients son. Patients son would like to take patient home and will follow up with her PCP for SNF placement at [facility] possibly tomorrow if bed is available. Patients son requested for CSW to ask Nicki if he can come by to [place] today. CSW called Nicki with [facility] who confirmed that patients son can come over today to look at [facility] for patient. CSW let patients son know that he can stop by [facility] today to look at facility.  Plan is for patient to go home and follow up with PCP for SNF placement at [facility].  Expected Discharge Plan: [place] Barriers to Discharge: Continued Medical Work up  Expected Discharge Plan and Services Expected Discharge Plan: [place] In-house Referral: Clinical Social Work Discharge Planning Services: CM Consult Post Acute Care Choice: [place] arrangements for the past 2 months: Single Family Home Expected Discharge Date: 05/10/20               DME Arranged: N/A DME Agency: NA                   Social Determinants of Health (Blunt) Interventions    Readmission Risk Interventions Readmission Risk Prevention Plan 05/09/2020   Transportation Screening Complete  PCP or Specialist Appt within 3-5 Days Complete  HRI or [place] Complete  Social Work Consult for [place] Planning/Counseling Complete  Medication Review Press photographer) Complete  Some recent data might be hidden

## 2020-05-10 NOTE — Progress Notes (Addendum)
Progress Note  Patient Name: Patricia Garcia Date of Encounter: 05/10/2020  [facility] Cardiologist: Freada Bergeron, MD   Subjective   No chest pain, no SOB, walked with PT yesterday - son in room this AM.  lst night pt thought people were going to harm her.  Inpatient Medications    Scheduled Meds: . sodium chloride   Intravenous Once  . aspirin  81 mg Oral Daily  . cholecalciferol  1,000 Units Oral Daily  . citalopram  20 mg Oral Daily  . docusate sodium  100 mg Oral BID  . ezetimibe  10 mg Oral Daily  . famotidine  40 mg Oral Daily  . ferrous sulfate  325 mg Oral Once per day on Mon Wed Fri  . heparin  5,000 Units Subcutaneous Q8H  . latanoprost  1 drop Both Eyes QHS  . loratadine  10 mg Oral Daily  . metoprolol succinate  25 mg Oral Daily  . multivitamin with minerals  1 tablet Oral Daily  . omega-3 acid ethyl esters  1,000 mg Oral Daily  . pantoprazole  40 mg Oral Daily  . potassium chloride  40 mEq Oral Q4H  . sodium chloride flush  3 mL Intravenous Q12H  . sodium chloride flush  3 mL Intravenous Q12H  . ticagrelor  90 mg Oral BID   Continuous Infusions: . sodium chloride     PRN Meds: sodium chloride, acetaminophen, HYDROcodone-acetaminophen, hydrOXYzine, ondansetron (ZOFRAN) IV, polyethylene glycol, senna-docusate, sodium chloride, sodium chloride flush   Vital Signs    Vitals:   05/09/20 1713 05/09/20 1958 05/10/20 0012 05/10/20 0329  BP: 114/68 (!) 109/51 133/67 123/78  Pulse: 70 74 78 73  Resp: $Remo'17 15 16 20  'Ryxtz$ Temp: 97.9 F (36.6 C) 98.2 F (36.8 C) 98.2 F (36.8 C) 98.5 F (36.9 C)  TempSrc: Oral Oral Oral Oral  SpO2: 98% 100% 100% 92%  Weight:    58.2 kg  Height:        Intake/Output Summary (Last 24 hours) at 05/10/2020 0800 Last data filed at 05/09/2020 2000 Gross per 24 hour  Intake 780 ml  Output 200 ml  Net 580 ml   Last 3 Weights 05/10/2020 05/09/2020 05/08/2020  Weight (lbs) 128 lb 6.4 oz 138 lb 10.7 oz 134 lb 7.7 oz   Weight (kg) 58.242 kg 62.9 kg 61 kg      Telemetry    SR - Personally Reviewed  ECG    No new - Personally Reviewed  Physical Exam   GEN: No acute distress.   Neck: No JVD Cardiac: RRR, no murmurs, rubs, or gallops.  Respiratory: Clear to auscultation bilaterally. GI: Soft, nontender, non-distended  MS: No edema; No deformity. Neuro:  Nonfocal  Psych: Normal affect   Labs    High Sensitivity Troponin:   Recent Labs  Lab 05/06/20 1859 05/06/20 2148 05/07/20 0524 05/07/20 0725  TROPONINIHS 2,365* 3,297* 3,515* 3,026*      Chemistry Recent Labs  Lab 05/06/20 1106 05/06/20 1859 05/08/20 0457 05/08/20 0457 05/08/20 1832 05/09/20 0259 05/10/20 0144  NA 137   < > 138  --   --  134* 136  K 3.3*   < > 4.2  --   --  3.6 3.3*  CL 102   < > 98  --   --  96* 96*  CO2 24   < > 26  --   --  29 29  GLUCOSE 122*   < > 123*  --   --  112* 128*  BUN 12   < > 16  --   --  14 16  CREATININE 0.70   < > 1.04*   < > 1.05* 1.02* 1.28*  CALCIUM 8.1*   < > 8.6*  --   --  8.2* 8.5*  PROT 5.8*  --   --   --   --   --   --   ALBUMIN 2.7*  --   --   --   --   --  2.7*  AST 60*  --   --   --   --   --   --   ALT 24  --   --   --   --   --   --   ALKPHOS 49  --   --   --   --   --   --   BILITOT 0.6  --   --   --   --   --   --   GFRNONAA >60   < > 49*   < > 48* 50* 38*  ANIONGAP 11   < > 14  --   --  9 11   < > = values in this interval not displayed.     Hematology Recent Labs  Lab 05/08/20 1832 05/09/20 0259 05/10/20 0144  WBC 10.2 9.8 8.6  RBC 3.47* 3.00* 3.10*  HGB 9.8* 8.5* 8.9*  HCT 30.3* 26.6* 27.8*  MCV 87.3 88.7 89.7  MCH 28.2 28.3 28.7  MCHC 32.3 32.0 32.0  RDW 14.3 14.4 14.1  PLT 345 315 361    BNP Recent Labs  Lab 05/06/20 1413  BNP 1,221.6*     DDimer No results for input(s): DDIMER in the last 168 hours.   Radiology    CARDIAC CATHETERIZATION  Result Date: 05/08/2020  1st Diag lesion is 85% stenosed.  Ramus lesion is 30% stenosed.  Prox  Cx to Mid Cx lesion is 90% stenosed.  Prox RCA to Mid RCA lesion is 95% stenosed.  Post intervention, there is a 0% residual stenosis.  A drug-eluting stent was successfully placed using a STENT RESOLUTE ONYX 3.0X15.  Post intervention, there is a 0% residual stenosis.  A drug-eluting stent was successfully placed using a [place] 2.25X15.  LV end diastolic pressure is mildly elevated.  1. 3 vessel obstructive CAD    - 95% mid RCA- this appeared to be the culprit lesion    - 85% proxima first diagonal    - 90% very small LCx in the AV groove 2. Mildly elevated LVEDP 3. Successful PCI of the mid RCA with DES x 1 4. Successful PCI of the first diagonal with DES x 1 Plan: DAPT for one year. Will transition lasix to po. Further plans per primary team.    Cardiac Studies   Echo 05/07/20 IMPRESSIONS    1. Hypokinesis of the basal inferior and inferolateral wall with overall  preserved LV function.  2. Left ventricular ejection fraction, by estimation, is 50 to 55%. The  left ventricle has low normal function. The left ventricle demonstrates  regional wall motion abnormalities (see scoring diagram/findings for  description). Left ventricular diastolic  parameters are consistent with Grade II diastolic dysfunction  (pseudonormalization). Elevated left atrial pressure.  3. Right ventricular systolic function is normal. The right ventricular  size is normal. There is mildly elevated pulmonary artery systolic  pressure.  4. Moderate pleural effusion in the left lateral region.  5. The  mitral valve is normal in structure. Mild mitral valve  regurgitation. No evidence of mitral stenosis.  6. The aortic valve is tricuspid. Aortic valve regurgitation is trivial.  No aortic stenosis is present.  7. The inferior vena cava is normal in size with greater than 50%  respiratory variability, suggesting right atrial pressure of 3 mmHg.   FINDINGS  Left Ventricle: Left ventricular  ejection fraction, by estimation, is 50  to 55%. The left ventricle has low normal function. The left ventricle  demonstrates regional wall motion abnormalities. The left ventricular  internal cavity size was normal in  size. There is no left ventricular hypertrophy. Left ventricular diastolic  parameters are consistent with Grade II diastolic dysfunction  (pseudonormalization). Elevated left atrial pressure.   Right Ventricle: The right ventricular size is normal.Right ventricular  systolic function is normal. There is mildly elevated pulmonary artery  systolic pressure. The tricuspid regurgitant velocity is 3.08 m/s, and  with an assumed right atrial pressure of  3 mmHg, the estimated right ventricular systolic pressure is 88.4 mmHg.   Left Atrium: Left atrial size was normal in size.   Right Atrium: Right atrial size was normal in size.   Pericardium: There is no evidence of pericardial effusion.   Mitral Valve: The mitral valve is normal in structure. Mild mitral annular  calcification. Mild mitral valve regurgitation. No evidence of mitral  valve stenosis.   Tricuspid Valve: The tricuspid valve is normal in structure. Tricuspid  valve regurgitation is mild . No evidence of tricuspid stenosis.   Aortic Valve: The aortic valve is tricuspid. Aortic valve regurgitation is  trivial. No aortic stenosis is present.   Pulmonic Valve: The pulmonic valve was normal in structure. Pulmonic valve  regurgitation is trivial. No evidence of pulmonic stenosis.   Aorta: The aortic root is normal in size and structure.   Venous: The inferior vena cava is normal in size with greater than 50%  respiratory variability, suggesting right atrial pressure of 3 mmHg.   Patient Profile     86 y.o. female LBBB, hypertension, depression, and a recent right femur fracture s/p total hip arthroplasty on 10/13/2021who presented with NSTEMI and CHF now s/p PCI to RCA (culprit) and D1 doing well 3.  Successful PCI of the mid RCA with DES x 1 4. Successful PCI of the first diagonal with DES x 1  Plan: DAPT for one year.  Further plans per primary team.   Assessment & Plan    #NSTEMI: -S/p successful PCI to RCA and D1 on 05/08/20 -Will need aggressive medical therapy as has multivessel CAD -TTE with LVEF of 50-55% with hypokinesis of the basal inferior and inferolateral wall, grade 2 diastolic dysfunction, moderate left pleural effusion, and mildly elevated PASP. -Continue ASA 81mg  daily and ticagrelor 90mg  BID-->will need for at least 1 year -Continue metoprolol to 25mg  XL daily -Has allergy to statins, continue zetia and will likely need PCSK9i as out-patient -Cardiac rehab upon discharge  #Acute Hypoxic Respiratory Failure Secondary to Acute on Chronic Diastolic CHF: Volume status significantly improved and now appears euvolemic. -Hold PO lasix-->may not need on discharge-->will hold and reassess volume status at clinic follow-up in 1-2 weeks -Continue metoprolol 25mg  XL -Would benefit from ACE/ARB-->will add as out-patient given mild bump in Cr -Monitor I/Os and daily weights (+656 since admit and wt labile 62.9 Kg yesterday now 58.2Kg)  -Low Na diet  Hypertension: - Continue home Clonidine. Patient states she has been taking this for years. - Continue metop  $'25mg'M$  XL daily - Add ACE/ARB as able as out-patient  Normocytic Anemia: Stable. -Received 1unit pre-cath; hemoglobin stable at 8.5 with no evidence of bleed -Iron supplementation  -Continue to monitor  Hypokalemia today at 3.3 Mg+ 2.1 replacing K+  Recent hip replacement Gait instability: -PT/OT consult has been done    For questions or updates, please contact [facility] Please consult [email or web address] for contact info under        Signed, Cecilie Kicks, NP  05/10/2020, 8:00 AM    Patient seen and examined and agree with Cecilie Kicks, NP.  Patient stable post PCI of RCA and D1. Awaiting  SNF.  Exam: General: sitting up in chair, comfortable CV: RR, no murmurs Resp: CTAB Abd: Soft, ND, NTTP Extremities: Warm and well perfused Neuro: AAOx3 this AM  Plan: -Okay to discharge to SNF from cardiac standpoint -Hold lasix on discharge and will reassess volume status at follow-up visit -Continue DAPT for at least 1 year -Continue zetia; will assess for [facility] as out-patient as patient intolerant of statins  -Will arrange for follow-up in Cardiology clinic in 1-2 weeks -Needs BMET in 1 week  Gwyndolyn Kaufman, MD

## 2020-05-10 NOTE — Progress Notes (Addendum)
Patient did not want to take her night time medication. She stated that she was too tired and just did not feel like taking her medications. She stated that all she does is take medications and she is sick of it. Educated patient on the importance of the medications especially Brilinta. She said Brilinta gave shortness of breath. Patient also needs covid test done for SNF, but does not want to do it tonight. She continue to refuse, will come back and try again at a later time.   2330 update: patient willing to take Brilinta, gave medication with coke. But states she'll do the test this am. Will pass on to day shift RN.  0157 update: patient resting with eye closed, will continue to monitor patient.    0243: patient woke up confused, she didn't know where she was or what time it was. Patient easily oriented.

## 2020-05-10 NOTE — Progress Notes (Signed)
   Covid-19 Vaccination Clinic  Name:  Patricia Garcia    MRN: [id number] DOB: December 28, 1932  05/10/2020  Patricia Garcia was observed post Covid-19 immunization for 15 minutes without incident. She was provided with Vaccine Information Sheet and instruction to access the V-Safe system.   Patricia Garcia was instructed to call 911 with any severe reactions post vaccine: Marland Kitchen Difficulty breathing  . Swelling of face and throat  . A fast heartbeat  . A bad rash all over body  . Dizziness and weakness   Immunizations Administered    Name Date Dose VIS Date Route   Moderna COVID-19 Vaccine 05/10/2020  1:35 PM 0.5 mL 06/2019 Intramuscular   Manufacturer: Moderna   Lot: [id number]   [place]: [id number]

## 2020-05-10 NOTE — Consult Note (Signed)
   [facility] Inpatient Consult   05/10/2020  BEENA CATANO 1933-03-26 [id number]  [facility] Follow up:  Patient screened for high risk score for unplanned readmission. Per chart review, patient is being recommended for a skilled nursing facility level of care.    No [facility] Care Management follow up needs at this time.    Netta Cedars, MSN, [facility] Liaison Nurse Mobile Phone [phone]  Toll free office [phone]

## 2020-05-10 NOTE — TOC Transition Note (Signed)
Transition of Care [facility]) - CM/SW Discharge Note   Patient Details  Name: Patricia Garcia MRN: [id number] Date of Birth: 27-Sep-1932  Transition of Care [facility]) CM/SW Contact:  Bethena Roys, RN Phone Number: 05/10/2020, 3:37 PM   Clinical Narrative: Family has decided that the plan will be for home with home health services via [place]. Patient will have 24 hour supervision in the home between family and friends. [facility] notified of the home health services and labs that will need to be drawn. Son will provide transportation home for the patient. Patient has durable medical equipment in the home. No further needs at this time.   Final next level of care: [place] Barriers to Discharge: No Barriers Identified   Patient Goals and CMS Choice Patient states their goals for this hospitalization and ongoing recovery are:: to return home CMS Medicare.gov Compare Post Acute Care list provided to:: Patient Choice offered to / list presented to : Patient     Discharge Plan and Services In-house Referral: Clinical Social Work Discharge Planning Services: CM Consult Post Acute Care Choice: Home Health          DME Arranged: N/A DME Agency: NA   Date HH Agency Contacted: 05/10/20 Time [place]: 1655 Representative spoke with at [place]: [place]  Readmission Risk Interventions Readmission Risk Prevention Plan 05/09/2020  Transportation Screening Complete  PCP or Specialist Appt within 3-5 Days Complete  HRI or [place] Complete  Social Work Consult for [place] Planning/Counseling Complete  Medication Review Press photographer) Complete  Some recent data might be hidden

## 2020-05-10 NOTE — Progress Notes (Signed)
CARDIAC REHAB PHASE I   PRE:  Rate/Rhythm: 77 SR  BP:  Supine:  Sitting: 134/53  Standing:    SaO2: 95%RA  MODE:  Ambulation: 200 ft   POST:  Rate/Rhythm: 91 SR  BP:  Supine:   Sitting: 152/53  Standing:    SaO2: 96%RA 1056-1133 Pt walked 200 ft on RA with gait belt use,rolling walker and asst x 1. Gait fairly steady and stood by herself. After sitting in recliner pt requested to go to bathroom. To bathroom and back to recliner. Put chair alarm in chair and notified pt not to get up by herself. Son in room. No c/o CP.  Tolerated well.   Graylon Good, RN BSN  05/10/2020 11:31 AM

## 2020-05-10 NOTE — Progress Notes (Signed)
Physical Therapy Treatment Patient Details Name: Patricia Garcia MRN: [id number] DOB: 1933/06/19 Today's Date: 05/10/2020    History of Present Illness 86 yo female admitted with acute CHF, elevated troponin. Pt with NSTEMI and transferred to [facility]. Pt underwent cardiac cath 10/19 and found to have severe CAD and stents placed. Recent R hip fx s/p R THA-direct anterior 05/03/2020. Hx of LBBB, fall, osteopenia    PT Comments    Pt making good progress with mobility. Pt just received her second dose of covid vaccine and would have to be in 14 day quarantine at SNF so son has decided to take pt home at this point.    Follow Up Recommendations  Home health PT;Supervision for mobility/OOB     Equipment Recommendations  None recommended by PT    Recommendations for Other Services       Precautions / Restrictions Precautions Precautions: Fall    Mobility  Bed Mobility Overal bed mobility: Needs Assistance Bed Mobility: Sit to Supine       Sit to supine: Min assist   General bed mobility comments: Assist to bring RLE back up into bed  Transfers Overall transfer level: Needs assistance Equipment used: Rolling walker (2 wheeled) Transfers: Sit to/from Stand Sit to Stand: Supervision         General transfer comment: Assist for lines/safety  Ambulation/Gait Ambulation/Gait assistance: Supervision Gait Distance (Feet): 300 Feet Assistive device: Rolling walker (2 wheeled) Gait Pattern/deviations: Step-through pattern;Decreased stride length Gait velocity: decr Gait velocity interpretation: 1.31 - 2.62 ft/sec, indicative of limited community ambulator General Gait Details: supervision for safety and lines   Stairs             Wheelchair Mobility    Modified Rankin (Stroke Patients Only)       Balance Overall balance assessment: Needs assistance Sitting-balance support: No upper extremity supported;Feet supported Sitting balance-Leahy Scale: Good      Standing balance support: No upper extremity supported;During functional activity Standing balance-Leahy Scale: Fair Standing balance comment: static standing without extenal support                            Cognition Arousal/Alertness: Awake/alert Behavior During Therapy: WFL for tasks assessed/performed Overall Cognitive Status: Impaired/Different from baseline Area of Impairment: Memory                     Memory: Decreased short-term memory                Exercises Total Joint Exercises Ankle Circles/Pumps: AROM;Both;10 reps;Supine Straight Leg Raises: AAROM;Right;5 reps;Supine    General Comments General comments (skin integrity, edema, etc.): VSS      Pertinent Vitals/Pain Pain Assessment: Faces Faces Pain Scale: Hurts a little bit Pain Location: rt thigh Pain Descriptors / Indicators: Sore Pain Intervention(s): Limited activity within patient's tolerance;Repositioned    Home Living                      Prior Function            PT Goals (current goals can now be found in the care plan section) Acute Rehab PT Goals Patient Stated Goal: regain independence Progress towards PT goals: Progressing toward goals    Frequency    Min 3X/week      PT Plan Discharge plan needs to be updated    Co-evaluation  AM-PAC PT "6 Clicks" Mobility   Outcome Measure  Help needed turning from your back to your side while in a flat bed without using bedrails?: None Help needed moving from lying on your back to sitting on the side of a flat bed without using bedrails?: None Help needed moving to and from a bed to a chair (including a wheelchair)?: A Little Help needed standing up from a chair using your arms (e.g., wheelchair or bedside chair)?: A Little Help needed to walk in hospital room?: A Little Help needed climbing 3-5 steps with a railing? : A Little 6 Click Score: 20    End of Session   Activity  Tolerance: Patient tolerated treatment well Patient left: with call bell/phone within reach;with family/visitor present;in bed;with bed alarm set   PT Visit Diagnosis: Unsteadiness on feet (R26.81);Difficulty in walking, not elsewhere classified (R26.2)     Time: 1359-1420 PT Time Calculation (min) (ACUTE ONLY): 21 min  Charges:  $Gait Training: 8-22 mins                     [place] Pager [phone] Office [place] 05/10/2020, 2:35 PM

## 2020-05-10 NOTE — Discharge Summary (Addendum)
Physician Discharge Summary  Patricia Garcia FIE:[id number] DOB: Jan 07, 1933 DOA: 05/06/2020  PCP: Lajean Manes, MD  Admit date: 05/06/2020 Discharge date: 05/10/2020  Admitted From: Home Disposition: Home  Recommendations for Outpatient Follow-up:  1. Follow ups as below. 2. Please obtain CBC/BMP/Mag at follow up 3. Please follow up on the following pending results: None  Home Health: Home health PT/OT/RN Equipment/Devices: Patient has appropriate DME's  Discharge Condition: Stable CODE STATUS: Full code   Contact information for follow-up providers    Freada Bergeron, MD Follow up on 05/22/2020.   Specialties: Cardiology, Radiology Why: at 2:15 PM with Melina Copa, PA-C  Contact information: [address] [place] [address] [phone]        Lajean Manes, MD. Schedule an appointment as soon as possible for a visit in 2 week(s).   Specialty: Internal Medicine Contact information: [address] [place] [address] [phone]            Contact information for after-discharge care    Destination    [facility] Preferred SNF .   Service: Skilled Nursing Contact information: [place] [phone]                   Hospital Course: 86 year old female with history of HTN, ischemic CM, LBBB, osteopenia, recent hospitalization from 10/13-10/15 for right femoral fracture s/p THA by Dr. Lyla Glassing returning with acute nausea and vomiting, and found to have non-STEMI, acute hypoxemic respiratory failure in the setting of diastolic CHF exacerbation.  Patient had no cardiopulmonary symptoms on admission.  In ED, saturating at 87- 88% on 2 L requiring 4 L to maintain appropriate saturation.  CT abdomen and pelvis without acute finding.  CXR concerning for pulmonary edema.  BNP elevated to 1221.  AST 60.  Hgb 7.9 (about baseline).  Started on IV Lasix.   High-sensitivity troponin came back elevated at 2365.  Started on heparin drip and transferred to [facility].  Eventually underwent LHC that revealed three-vessel obstructive CAD, and had DES stent to mid RCA and first diagonal.  Started on DAPT with aspirin and Brilinta.  Respiratory failure resolved. She was cleared for discharge by cardiology.    Therapy recommending SNF but family wanted to take home with home health.  See individual problem list below for more hospital course.  Discharge Diagnoses:  Acute hypoxemic respiratory failure due to acute on chronic diastolic CHF and non-STEMI: Resolved. -Treat CHF and NSTEMI as below -Encourage incentive spirometry  Non-STEMI: presented with nausea and vomiting without cardiopulmonary symptoms.  High-sensitivity troponin 2365>> 3515> 3026 -Status post PCI with DES to mid RCA and D1. -Discharged on low-dose aspirin, Brilinta, metoprolol XL 25 mg and Zetia. -Consider PCSK9 inhibitors.  Has history of statin intolerance. -Needs DAPT at least for 1 year -Cardiology to arrange outpatient follow-up.  Acute on chronic diastolic CHF: No cardiopulmonary symptoms but hypoxemia on presentation.  TTE with LVEF of 50 to 55% with hypokinesis of basal inferior and inferolateral wall, G2 DD, moderate left pleural effusion, mildly elevated PASP.  Appears euvolemic.  On room air.  I&O incomplete but 5 unmeasured voids.  Weight labile, 62.9 kg yesterday and 58.2 kg today.  Creatinine slightly up to 1.28, from 1.02 yesterday. -Lasix discontinued per cardiology recommendation -On metoprolol XL.  ACEI/ARB outpatient once renal function improves. -Recheck renal function in 1 week   Postop acute blood loss anemia on anemia of chronic disease:  Relatively stable.  Anemia panel consistent with anemia of chronic disease.  Received 1 units -Baseline Hgb 11-12 >8.1 on discharge on 10/15>7.9 (admit)> 1 unit> 9.6 (exaggerated)> 8.6> 8.9 -Continue p.o. iron with  bowel regimen -Recheck CBC in 1 week  AKI: Baseline Cr ~0.7-0.8>> 1.02> 1.28.  Likely due to diuretics (Lasix). -Lasix discontinued -Recheck in 1 week.  Right femoral neck fracture s/p recent right THA by Dr. Lyla Glassing on 10/13-stable. -Continue PT/OT -Outpatient follow-up with orthopedic surgery as previously planned  Sinus congestion with postnasal drip -Supportive care with Zyrtec and Flonase  Mild hyponatremia:  Resolved.  Nausea/vomiting: Likely related to non-STEMI.  CT abdomen and pelvis without acute finding.  Resolved.  Essential hypertension: Normotensive. -Continue home clonidine -Metoprolol XL 25 mg daily.  Hypokalemia: K3.3.  Mg 2.1. -Received K-Dur 40 mEq x 2 prior to discharge.  Anxiety: Stable -Continue home Celexa  GERD -Continue PPI  Constipation -Continue bowel regimen  Debility/physical deconditioning -Home health PT/OT/RN   Body mass index is 22.75 kg/m.            Discharge Exam: Vitals:   05/10/20 1100 05/10/20 1151  BP: (!) 134/53 (!) 136/58  Pulse: 77 87  Resp: 20 19  Temp: 98.7 F (37.1 C) 98.6 F (37 C)  SpO2: 98% 97%    GENERAL: No apparent distress.  Nontoxic. HEENT: MMM.  Vision and hearing grossly intact.  NECK: Supple.  No apparent JVD.  RESP: On room air.  No IWOB.  Fair aeration bilaterally. CVS:  RRR. Heart sounds normal.  ABD/GI/GU: Bowel sounds present. Soft. Non tender.  MSK/EXT:  Moves extremities. No apparent deformity. No edema.  SKIN: Right hip surgical wound DCI. NEURO: Awake, alert and oriented appropriately.  No apparent focal neuro deficit. PSYCH: Calm. Normal affect.   Discharge Instructions  Discharge Instructions    (HEART FAILURE PATIENTS) Call MD:  Anytime you have any of the following symptoms: 1) 3 pound weight gain in 24 hours or 5 pounds in 1 week 2) shortness of breath, with or without a dry hacking cough 3) swelling in the hands, feet or stomach 4) if you have to sleep on extra  pillows at night in order to breathe.   Complete by: As directed    Amb Referral to Cardiac Rehabilitation   Complete by: As directed    Diagnosis:  NSTEMI Coronary Stents     After initial evaluation and assessments completed: Virtual Based Care may be provided alone or in conjunction with Phase 2 Cardiac Rehab based on patient barriers.: Yes   Call MD for:  difficulty breathing, headache or visual disturbances   Complete by: As directed    Call MD for:  extreme fatigue   Complete by: As directed    Call MD for:  persistant dizziness or light-headedness   Complete by: As directed    Call MD for:  persistant nausea and vomiting   Complete by: As directed    Call MD for:  redness, tenderness, or signs of infection (pain, swelling, redness, odor or green/yellow discharge around incision site)   Complete by: As directed    Call MD for:  severe uncontrolled pain   Complete by: As directed    Call MD for:  temperature >100.4   Complete by: As directed    Diet - low sodium heart healthy   Complete by: As directed    Discharge instructions   Complete by: As directed    It has been a pleasure taking care of  you!  You were hospitalized with nausea and vomiting due to blockage of blood vessels in your heart.  You were treated surgically medically.  Your symptoms resolved.  We are discharging you home on new medications in addition to your home medications.  It is very important that you take your medications as prescribed.  Please review your new medication list and the directions on your medications before you take them.  Please follow-up with your primary care doctor, cardiologist and orthopedic surgeon as recommended.   Take care,   Increase activity slowly   Complete by: As directed    No wound care   Complete by: As directed      Allergies as of 05/10/2020      Reactions   Penicillins Other (See Comments)   unknown   Prednisone Other (See Comments)   unknown   Statins Other  (See Comments)   shaking   Sulfonamide Derivatives Other (See Comments)   unknown   Tramadol Other (See Comments)   unknown      Medication List    TAKE these medications   acetaminophen 500 MG tablet Commonly known as: TYLENOL Take 1 tablet (500 mg total) by mouth every 8 (eight) hours as needed for mild pain, moderate pain, fever or headache.   ALPRAZolam 0.25 MG tablet Commonly known as: XANAX Take 0.025 mg by mouth at bedtime as needed for anxiety.   aspirin 81 MG chewable tablet Commonly known as: Aspirin Childrens Chew 1 tablet (81 mg total) by mouth 2 (two) times daily with a meal.   CALCIUM 1200 PO Take 1,200 mg by mouth daily.   cholecalciferol 1000 units tablet Commonly known as: VITAMIN D Take 1,000 Units by mouth daily.   citalopram 20 MG tablet Commonly known as: CELEXA Take 20 mg by mouth daily.   cloNIDine 0.1 MG tablet Commonly known as: CATAPRES Take 0.1 mg by mouth daily.   docusate sodium 100 MG capsule Commonly known as: COLACE Take 1 capsule (100 mg total) by mouth 2 (two) times daily.   ezetimibe 10 MG tablet Commonly known as: ZETIA Take 1 tablet (10 mg total) by mouth daily. Start taking on: May 11, 2020   famotidine 40 MG tablet Commonly known as: PEPCID Take 40 mg by mouth daily.   ferrous sulfate 325 (65 FE) MG tablet Take 325 mg by mouth See admin instructions. 3 times a week   HYDROcodone-acetaminophen 5-325 MG tablet Commonly known as: NORCO/VICODIN Take 1-2 tablets by mouth every 4 (four) hours as needed for up to 7 days for moderate pain (pain score 4-6).   latanoprost 0.005 % ophthalmic solution Commonly known as: XALATAN Place 1 drop into both eyes at bedtime.   metoprolol succinate 25 MG 24 hr tablet Commonly known as: TOPROL-XL Take 1 tablet (25 mg total) by mouth daily. Start taking on: May 11, 2020   multivitamin-iron-minerals-folic acid chewable tablet Chew 1 tablet by mouth daily.   OLIVE LEAF  EXTRACT PO Take 1 tablet by mouth daily.   Omega-3 1000 MG Caps Take 1,000 mg by mouth daily.   omeprazole 20 MG capsule Commonly known as: PRILOSEC Take 1 capsule (20 mg total) by mouth daily.   OPCON-A OP Place 1 drop into both eyes daily as needed (for eye allergy).   senna 8.6 MG Tabs tablet Commonly known as: SENOKOT Take 1 tablet (8.6 mg total) by mouth daily as needed for mild constipation.   sodium chloride 2 % ophthalmic solution Commonly known as:  MURO 128 Place 1 drop into both eyes 2 (two) times daily as needed for eye irritation.   ticagrelor 90 MG Tabs tablet Commonly known as: BRILINTA Take 1 tablet (90 mg total) by mouth 2 (two) times daily.       Consultations:  Cardiology  Procedures/Studies:  2D Echo on 05/07/2020 1. Hypokinesis of the basal inferior and inferolateral wall with overall  preserved LV function.  2. Left ventricular ejection fraction, by estimation, is 50 to 55%. The  left ventricle has low normal function. The left ventricle demonstrates  regional wall motion abnormalities (see scoring diagram/findings for  description). Left ventricular diastolic  parameters are consistent with Grade II diastolic dysfunction  (pseudonormalization). Elevated left atrial pressure.  3. Right ventricular systolic function is normal. The right ventricular  size is normal. There is mildly elevated pulmonary artery systolic  pressure.  4. Moderate pleural effusion in the left lateral region.  5. The mitral valve is normal in structure. Mild mitral valve  regurgitation. No evidence of mitral stenosis.  6. The aortic valve is tricuspid. Aortic valve regurgitation is trivial.  No aortic stenosis is present.  7. The inferior vena cava is normal in size with greater than 50%  respiratory variability, suggesting right atrial pressure of 3 mmHg.    Left heart catheterization on 05/08/2020  1st Diag lesion is 85% stenosed.  Ramus lesion is 30%  stenosed.  Prox Cx to Mid Cx lesion is 90% stenosed.  Prox RCA to Mid RCA lesion is 95% stenosed.  Post intervention, there is a 0% residual stenosis.  A drug-eluting stent was successfully placed using a STENT RESOLUTE ONYX 3.0X15.  Post intervention, there is a 0% residual stenosis.  A drug-eluting stent was successfully placed using a [place] 2.25X15.  LV end diastolic pressure is mildly elevated.   1. 3 vessel obstructive CAD    - 95% mid RCA- this appeared to be the culprit lesion    - 85% proxima first diagonal    - 90% very small LCx in the AV groove 2. Mildly elevated LVEDP 3. Successful PCI of the mid RCA with DES x 1 4. Successful PCI of the first diagonal with DES x 1  Plan: DAPT for one year. Will transition lasix to po. Further plans per primary team.     CT ABDOMEN PELVIS W CONTRAST  Result Date: 05/06/2020 CLINICAL DATA:  Nausea, vomiting. EXAM: CT ABDOMEN AND PELVIS WITH CONTRAST TECHNIQUE: Multidetector CT imaging of the abdomen and pelvis was performed using the standard protocol following bolus administration of intravenous contrast. CONTRAST:  166mL OMNIPAQUE IOHEXOL 300 MG/ML  SOLN COMPARISON:  10/10/2016 FINDINGS: Lower chest: Small bilateral pleural effusions. Dependent atelectasis in the lower lobes. Hepatobiliary: 14 mm low-density lesion in the left hepatic lobe is stable since prior study compatible with cyst. Adjacent enhancing lesion most compatible with hemangioma. No suspicious focal hepatic abnormality. Gallbladder unremarkable. Pancreas: No focal abnormality or ductal dilatation. Spleen: No focal abnormality.  Normal size. Adrenals/Urinary Tract: Previously seen right renal stone has migrated into the right renal pelvis, layering dependently and nonobstructing. This measures 7 mm. No hydronephrosis. Adrenal glands and urinary bladder grossly unremarkable. Gas within the urinary bladder presumably from recent catheterization. Stomach/Bowel:  Left colonic diverticulosis. No active diverticulitis. Stomach and small bowel unremarkable. Vascular/Lymphatic: Aortic atherosclerosis. No evidence of aneurysm or adenopathy. Reproductive: Prior hysterectomy.  No adnexal masses. Other: No free fluid or free air. Musculoskeletal: Prior right hip replacement. IMPRESSION: 7 mm stone layering dependently  in the right renal pelvis. No obstruction. Small bilateral pleural effusions.  Dependent atelectasis. Left colonic diverticulosis.  No active diverticulitis. Aortic atherosclerosis. Electronically Signed   By: Rolm Baptise M.D.   On: 05/06/2020 15:20   CARDIAC CATHETERIZATION  Result Date: 05/08/2020  1st Diag lesion is 85% stenosed.  Ramus lesion is 30% stenosed.  Prox Cx to Mid Cx lesion is 90% stenosed.  Prox RCA to Mid RCA lesion is 95% stenosed.  Post intervention, there is a 0% residual stenosis.  A drug-eluting stent was successfully placed using a STENT RESOLUTE ONYX 3.0X15.  Post intervention, there is a 0% residual stenosis.  A drug-eluting stent was successfully placed using a [place] 2.25X15.  LV end diastolic pressure is mildly elevated.  1. 3 vessel obstructive CAD    - 95% mid RCA- this appeared to be the culprit lesion    - 85% proxima first diagonal    - 90% very small LCx in the AV groove 2. Mildly elevated LVEDP 3. Successful PCI of the mid RCA with DES x 1 4. Successful PCI of the first diagonal with DES x 1 Plan: DAPT for one year. Will transition lasix to po. Further plans per primary team.   Pelvis Portable  Result Date: 05/02/2020 CLINICAL DATA:  Status post right hip replacement EXAM: PORTABLE PELVIS 1-2 VIEWS COMPARISON:  Intraoperative films from earlier in the same day. FINDINGS: Right hip prosthesis is noted in satisfactory position. No acute bony or soft tissue abnormality is noted. IMPRESSION: Status post right hip replacement Electronically Signed   By: Inez Catalina M.D.   On: 05/02/2020 19:03   DG Chest  Port 1 View  Result Date: 04/30/2020 CLINICAL DATA:  Hip fracture, preop evaluation EXAM: PORTABLE CHEST 1 VIEW COMPARISON:  None. FINDINGS: The heart size and mediastinal contours are within normal limits. Slight, likely chronic interstitial prominence. No pleural effusion. The visualized skeletal structures are unremarkable. IMPRESSION: No acute process in the chest. Electronically Signed   By: Macy Mis M.D.   On: 04/30/2020 20:09   DG Knee Right Port  Result Date: 05/01/2020 CLINICAL DATA:  Pain following recent fall EXAM: PORTABLE RIGHT KNEE - 1-2 VIEW COMPARISON:  None. FINDINGS: Frontal and lateral views were obtained. No fracture or dislocation. No joint effusion. Joint spaces appear normal. No erosive change. IMPRESSION: No fracture, dislocation, or joint effusion. No evident arthropathy. Electronically Signed   By: Lowella Grip III M.D.   On: 05/01/2020 08:26   DG Abd Acute W/Chest  Result Date: 05/06/2020 CLINICAL DATA:  Vomiting, status post right hip total arthroplasty EXAM: X-RAY ABDOMEN 2 VIEW COMPARISON:  None. FINDINGS: There is no evidence of dilated bowel loops or free intraperitoneal air. Right-sided renal calculus. Multiple phleboliths in the pelvis. Mild cardiomegaly. Mild, diffuse bilateral interstitial pulmonary opacity. Status post right hip total arthroplasty. IMPRESSION: 1. Nonobstructive bowel gas pattern. 2. Right-sided nephrolithiasis. 3. Mild, diffuse bilateral interstitial pulmonary opacity, likely edema in the setting of cardiomegaly. No focal airspace opacity. 4. Status post right hip total arthroplasty. Electronically Signed   By: Eddie Candle M.D.   On: 05/06/2020 11:22   DG C-Arm 1-60 Min-No Report  Result Date: 05/02/2020 Fluoroscopy was utilized by the requesting physician.  No radiographic interpretation.   ECHOCARDIOGRAM COMPLETE  Result Date: 05/07/2020    ECHOCARDIOGRAM REPORT   Patient Name:   GLORIAN MCDONELL Date of Exam: 05/07/2020  Medical Rec #:  [id number]        Height:  63.0 in Accession #:    [id number]       Weight:       135.6 lb Date of Birth:  08/21/1932        BSA:          1.639 m Patient Age:    33 years         BP:           123/58 mmHg Patient Gender: F                HR:           82 bpm. Exam Location:  Inpatient Procedure: 2D Echo, Cardiac Doppler and Color Doppler Indications:    Dyspnea  History:        Patient has no prior history of Echocardiogram examinations.                 Cardiomyopathy, Arrythmias:LBBB, Signs/Symptoms:Dyspnea and                 Pleurall effusion; Risk Factors:Hypertension. Elevated Troponin,                 s/p hip replacement.  Sonographer:    Dustin Flock Referring Phys: [id number] [place]  1. Hypokinesis of the basal inferior and inferolateral wall with overall preserved LV function.  2. Left ventricular ejection fraction, by estimation, is 50 to 55%. The left ventricle has low normal function. The left ventricle demonstrates regional wall motion abnormalities (see scoring diagram/findings for description). Left ventricular diastolic  parameters are consistent with Grade II diastolic dysfunction (pseudonormalization). Elevated left atrial pressure.  3. Right ventricular systolic function is normal. The right ventricular size is normal. There is mildly elevated pulmonary artery systolic pressure.  4. Moderate pleural effusion in the left lateral region.  5. The mitral valve is normal in structure. Mild mitral valve regurgitation. No evidence of mitral stenosis.  6. The aortic valve is tricuspid. Aortic valve regurgitation is trivial. No aortic stenosis is present.  7. The inferior vena cava is normal in size with greater than 50% respiratory variability, suggesting right atrial pressure of 3 mmHg. FINDINGS  Left Ventricle: Left ventricular ejection fraction, by estimation, is 50 to 55%. The left ventricle has low normal function. The left ventricle demonstrates regional  wall motion abnormalities. The left ventricular internal cavity size was normal in size. There is no left ventricular hypertrophy. Left ventricular diastolic parameters are consistent with Grade II diastolic dysfunction (pseudonormalization). Elevated left atrial pressure. Right Ventricle: The right ventricular size is normal.Right ventricular systolic function is normal. There is mildly elevated pulmonary artery systolic pressure. The tricuspid regurgitant velocity is 3.08 m/s, and with an assumed right atrial pressure of  3 mmHg, the estimated right ventricular systolic pressure is 41.3 mmHg. Left Atrium: Left atrial size was normal in size. Right Atrium: Right atrial size was normal in size. Pericardium: There is no evidence of pericardial effusion. Mitral Valve: The mitral valve is normal in structure. Mild mitral annular calcification. Mild mitral valve regurgitation. No evidence of mitral valve stenosis. Tricuspid Valve: The tricuspid valve is normal in structure. Tricuspid valve regurgitation is mild . No evidence of tricuspid stenosis. Aortic Valve: The aortic valve is tricuspid. Aortic valve regurgitation is trivial. No aortic stenosis is present. Pulmonic Valve: The pulmonic valve was normal in structure. Pulmonic valve regurgitation is trivial. No evidence of pulmonic stenosis. Aorta: The aortic root is normal in size and structure. Venous: The inferior vena cava is  normal in size with greater than 50% respiratory variability, suggesting right atrial pressure of 3 mmHg.  Additional Comments: Hypokinesis of the basal inferior and inferolateral wall with overall preserved LV function. There is a moderate pleural effusion in the left lateral region.  LEFT VENTRICLE PLAX 2D LVIDd:         4.70 cm  Diastology LVIDs:         3.30 cm  LV e' medial:    5.77 cm/s LV PW:         1.10 cm  LV E/e' medial:  18.2 LV IVS:        1.10 cm  LV e' lateral:   8.70 cm/s LVOT diam:     2.00 cm  LV E/e' lateral: 12.1 LV SV:          56 LV SV Index:   34 LVOT Area:     3.14 cm  RIGHT VENTRICLE RV Basal diam:  3.20 cm RV S prime:     8.16 cm/s TAPSE (M-mode): 2.1 cm LEFT ATRIUM             Index       RIGHT ATRIUM           Index LA diam:        4.00 cm 2.44 cm/m  RA Area:     13.20 cm LA Vol (A2C):   32.6 ml 19.89 ml/m RA Volume:   31.10 ml  18.97 ml/m LA Vol (A4C):   53.9 ml 32.88 ml/m LA Biplane Vol: 42.2 ml 25.74 ml/m  AORTIC VALVE LVOT Vmax:   100.00 cm/s LVOT Vmean:  76.800 cm/s LVOT VTI:    0.178 m  AORTA Ao Root diam: 2.40 cm MITRAL VALVE                TRICUSPID VALVE MV Area (PHT): 5.75 cm     TR Peak grad:   37.9 mmHg MV Decel Time: 132 msec     TR Vmax:        308.00 cm/s MV E velocity: 105.00 cm/s MV A velocity: 96.00 cm/s   SHUNTS MV E/A ratio:  1.09         Systemic VTI:  0.18 m                             Systemic Diam: 2.00 cm Kirk Ruths MD Electronically signed by Kirk Ruths MD Signature Date/Time: 05/07/2020/12:45:14 PM    Final    DG HIP OPERATIVE UNILAT W OR W/O PELVIS RIGHT  Result Date: 05/02/2020 CLINICAL DATA:  Proximal right femoral fracture EXAM: OPERATIVE RIGHT HIP WITH PELVIS COMPARISON:  04/30/2020 FLUOROSCOPY TIME:  Radiation Exposure Index (as provided by the fluoroscopic device): 0.97 mGy If the device does not provide the exposure index: Fluoroscopy Time:  9 seconds Number of Acquired Images:  4 FINDINGS: Initial images again demonstrate the subcapital femoral neck fracture with impaction at the fracture site. Subsequent right hip prosthesis is noted in satisfactory position. No acute soft tissue abnormality is seen. IMPRESSION: Status post right hip replacement Electronically Signed   By: Inez Catalina M.D.   On: 05/02/2020 19:04   DG Hip Unilat W or Wo Pelvis 2-3 Views Right  Result Date: 04/30/2020 CLINICAL DATA:  Fall from second run of stepladder and onto right hip EXAM: DG HIP (WITH OR WITHOUT PELVIS) 2-3V RIGHT COMPARISON:  CT 12/10/2016 FINDINGS: There is a subcapital fracture  dislocation  of the right proximal femur with slight anterior displacement and varus angulation across the fracture line. Right femoral head remains normally located in the acetabulum. Proximal left femur is intact and normally located. Bones of the pelvis appear intact and congruent. Right lateral hip swelling. Degenerative changes present in the spine, hips and pelvis. No other acute osseous or soft tissue abnormality. IMPRESSION: Mildly displaced and angulated subcapital fracture of the right proximal femur. Electronically Signed   By: Lovena Le M.D.   On: 04/30/2020 19:51       The results of significant diagnostics from this hospitalization (including imaging, microbiology, ancillary and laboratory) are listed below for reference.     Microbiology: Recent Results (from the past 240 hour(s))  Respiratory Panel by RT PCR (Flu A&B, Covid) - Nasopharyngeal Swab     Status: None   Collection Time: 04/30/20  8:21 PM   Specimen: Nasopharyngeal Swab  Result Value Ref Range Status   SARS Coronavirus 2 by RT PCR NEGATIVE NEGATIVE Final    Comment: (NOTE) SARS-CoV-2 target nucleic acids are NOT DETECTED.  The SARS-CoV-2 RNA is generally detectable in upper respiratoy specimens during the acute phase of infection. The lowest concentration of SARS-CoV-2 viral copies this assay can detect is 131 copies/mL. A negative result does not preclude SARS-Cov-2 infection and should not be used as the sole basis for treatment or other patient management decisions. A negative result may occur with  improper specimen collection/handling, submission of specimen other than nasopharyngeal swab, presence of viral mutation(s) within the areas targeted by this assay, and inadequate number of viral copies (<131 copies/mL). A negative result must be combined with clinical observations, patient history, and epidemiological information. The expected result is Negative.  Fact Sheet for Patients:   PinkCheek.be  Fact Sheet for Healthcare Providers:  GravelBags.it  This test is no t yet approved or cleared by the [place] FDA and  has been authorized for detection and/or diagnosis of SARS-CoV-2 by FDA under an Emergency Use Authorization (EUA). This EUA will remain  in effect (meaning this test can be used) for the duration of the COVID-19 declaration under Section 564(b)(1) of the Act, 21 U.S.C. section 360bbb-3(b)(1), unless the authorization is terminated or revoked sooner.     Influenza A by PCR NEGATIVE NEGATIVE Final   Influenza B by PCR NEGATIVE NEGATIVE Final    Comment: (NOTE) The Xpert Xpress SARS-CoV-2/FLU/RSV assay is intended as an aid in  the diagnosis of influenza from Nasopharyngeal swab specimens and  should not be used as a sole basis for treatment. Nasal washings and  aspirates are unacceptable for Xpert Xpress SARS-CoV-2/FLU/RSV  testing.  Fact Sheet for Patients: PinkCheek.be  Fact Sheet for Healthcare Providers: GravelBags.it  This test is not yet approved or cleared by the [place] FDA and  has been authorized for detection and/or diagnosis of SARS-CoV-2 by  FDA under an Emergency Use Authorization (EUA). This EUA will remain  in effect (meaning this test can be used) for the duration of the  Covid-19 declaration under Section 564(b)(1) of the Act, 21  U.S.C. section 360bbb-3(b)(1), unless the authorization is  terminated or revoked. Performed at [facility], [place] [address] [place], [place] [address]   Surgical PCR screen     Status: Abnormal   Collection Time: 05/01/20  1:15 AM   Specimen: Nasal Mucosa; Nasal Swab  Result Value Ref Range Status   MRSA, PCR NEGATIVE NEGATIVE Final   Staphylococcus aureus POSITIVE (A) NEGATIVE Final  Comment: (NOTE) The Xpert SA Assay (FDA approved for NASAL  specimens in patients 32 years of age and older), is one component of a comprehensive surveillance program. It is not intended to diagnose infection nor to guide or monitor treatment. Performed at [facility], [place] [address] [place], [place] [address]   Respiratory Panel by RT PCR (Flu A&B, Covid) - Nasopharyngeal Swab     Status: None   Collection Time: 05/06/20 11:06 AM   Specimen: Nasopharyngeal Swab  Result Value Ref Range Status   SARS Coronavirus 2 by RT PCR NEGATIVE NEGATIVE Final    Comment: (NOTE) SARS-CoV-2 target nucleic acids are NOT DETECTED.  The SARS-CoV-2 RNA is generally detectable in upper respiratoy specimens during the acute phase of infection. The lowest concentration of SARS-CoV-2 viral copies this assay can detect is 131 copies/mL. A negative result does not preclude SARS-Cov-2 infection and should not be used as the sole basis for treatment or other patient management decisions. A negative result may occur with  improper specimen collection/handling, submission of specimen other than nasopharyngeal swab, presence of viral mutation(s) within the areas targeted by this assay, and inadequate number of viral copies (<131 copies/mL). A negative result must be combined with clinical observations, patient history, and epidemiological information. The expected result is Negative.  Fact Sheet for Patients:  PinkCheek.be  Fact Sheet for Healthcare Providers:  GravelBags.it  This test is no t yet approved or cleared by the [place] FDA and  has been authorized for detection and/or diagnosis of SARS-CoV-2 by FDA under an Emergency Use Authorization (EUA). This EUA will remain  in effect (meaning this test can be used) for the duration of the COVID-19 declaration under Section 564(b)(1) of the Act, 21 U.S.C. section 360bbb-3(b)(1), unless the authorization is terminated or revoked  sooner.     Influenza A by PCR NEGATIVE NEGATIVE Final   Influenza B by PCR NEGATIVE NEGATIVE Final    Comment: (NOTE) The Xpert Xpress SARS-CoV-2/FLU/RSV assay is intended as an aid in  the diagnosis of influenza from Nasopharyngeal swab specimens and  should not be used as a sole basis for treatment. Nasal washings and  aspirates are unacceptable for Xpert Xpress SARS-CoV-2/FLU/RSV  testing.  Fact Sheet for Patients: PinkCheek.be  Fact Sheet for Healthcare Providers: GravelBags.it  This test is not yet approved or cleared by the [place] FDA and  has been authorized for detection and/or diagnosis of SARS-CoV-2 by  FDA under an Emergency Use Authorization (EUA). This EUA will remain  in effect (meaning this test can be used) for the duration of the  Covid-19 declaration under Section 564(b)(1) of the Act, 21  U.S.C. section 360bbb-3(b)(1), unless the authorization is  terminated or revoked. Performed at [facility], [place] [address], [place], [place] [address]   SARS Coronavirus 2 by RT PCR (hospital order, performed in [facility] hospital lab) Nasopharyngeal Nasopharyngeal Swab     Status: None   Collection Time: 05/10/20  8:23 AM   Specimen: Nasopharyngeal Swab  Result Value Ref Range Status   SARS Coronavirus 2 NEGATIVE NEGATIVE Final    Comment: (NOTE) SARS-CoV-2 target nucleic acids are NOT DETECTED.  The SARS-CoV-2 RNA is generally detectable in upper and lower respiratory specimens during the acute phase of infection. The lowest concentration of SARS-CoV-2 viral copies this assay can detect is 250 copies / mL. A negative result does not preclude SARS-CoV-2 infection and should not be used as the sole basis for treatment  or other patient management decisions.  A negative result may occur with improper specimen collection / handling, submission of specimen other than nasopharyngeal swab,  presence of viral mutation(s) within the areas targeted by this assay, and inadequate number of viral copies (<250 copies / mL). A negative result must be combined with clinical observations, patient history, and epidemiological information.  Fact Sheet for Patients:   StrictlyIdeas.no  Fact Sheet for Healthcare Providers: BankingDealers.co.za  This test is not yet approved or  cleared by the [place] FDA and has been authorized for detection and/or diagnosis of SARS-CoV-2 by FDA under an Emergency Use Authorization (EUA).  This EUA will remain in effect (meaning this test can be used) for the duration of the COVID-19 declaration under Section 564(b)(1) of the Act, 21 U.S.C. section 360bbb-3(b)(1), unless the authorization is terminated or revoked sooner.  Performed at [facility], [place] [address], [place], [place] [address]      Labs: BNP (last 3 results) Recent Labs    05/06/20 1413  BNP 5,638.7*   Basic Metabolic Panel: Recent Labs  Lab 05/06/20 1106 05/06/20 1859 05/07/20 0524 05/07/20 0725 05/08/20 0457 05/08/20 1832 05/09/20 0259 05/10/20 0144  NA 137  --  138  --  138  --  134* 136  K 3.3*  --  3.2*  --  4.2  --  3.6 3.3*  CL 102  --  100  --  98  --  96* 96*  CO2 24  --  28  --  26  --  29 29  GLUCOSE 122*  --  118*  --  123*  --  112* 128*  BUN 12  --  11  --  16  --  14 16  CREATININE 0.70   < > 0.88  --  1.04* 1.05* 1.02* 1.28*  CALCIUM 8.1*  --  8.1*  --  8.6*  --  8.2* 8.5*  MG  --   --   --  1.8 2.2  --  1.9 2.1  PHOS  --   --   --   --   --   --   --  3.5   < > = values in this interval not displayed.   Liver Function Tests: Recent Labs  Lab 05/06/20 1106 05/10/20 0144  AST 60*  --   ALT 24  --   ALKPHOS 49  --   BILITOT 0.6  --   PROT 5.8*  --   ALBUMIN 2.7* 2.7*   Recent Labs  Lab 05/06/20 1106  LIPASE 19   No results for input(s): AMMONIA in the last 168  hours. CBC: Recent Labs  Lab 05/06/20 1106 05/06/20 1106 05/07/20 0524 05/08/20 0457 05/08/20 1832 05/09/20 0259 05/10/20 0144  WBC 10.1   < > 10.7* 9.8 10.2 9.8 8.6  NEUTROABS 8.2*  --   --   --   --   --   --   HGB 7.9*   < > 7.9* 9.6* 9.8* 8.5* 8.9*  HCT 24.5*   < > 23.9* 29.5* 30.3* 26.6* 27.8*  MCV 91.4   < > 87.9 89.7 87.3 88.7 89.7  PLT 290   < > 283 312 345 315 361   < > = values in this interval not displayed.   Cardiac Enzymes: No results for input(s): CKTOTAL, CKMB, CKMBINDEX, TROPONINI in the last 168 hours. BNP: Invalid input(s): POCBNP CBG: No results for input(s): GLUCAP in the last 168 hours. D-Dimer  No results for input(s): DDIMER in the last 72 hours. Hgb A1c Recent Labs    05/08/20 0457  HGBA1C 5.7*   Lipid Profile Recent Labs    05/08/20 0457  CHOL 166  HDL 38*  LDLCALC 103*  TRIG 126  CHOLHDL 4.4   Thyroid function studies No results for input(s): TSH, T4TOTAL, T3FREE, THYROIDAB in the last 72 hours.  Invalid input(s): FREET3 Anemia work up No results for input(s): VITAMINB12, FOLATE, FERRITIN, TIBC, IRON, RETICCTPCT in the last 72 hours. Urinalysis    Component Value Date/Time   COLORURINE YELLOW 05/06/2020 1025   APPEARANCEUR CLEAR 05/06/2020 1025   LABSPEC 1.012 05/06/2020 1025   PHURINE 5.0 05/06/2020 1025   GLUCOSEU NEGATIVE 05/06/2020 1025   GLUCOSEU NEGATIVE 06/25/2010 1249   HGBUR NEGATIVE 05/06/2020 1025   BILIRUBINUR NEGATIVE 05/06/2020 1025   KETONESUR 5 (A) 05/06/2020 1025   PROTEINUR NEGATIVE 05/06/2020 1025   UROBILINOGEN 0.2 06/25/2010 1249   NITRITE NEGATIVE 05/06/2020 1025   LEUKOCYTESUR NEGATIVE 05/06/2020 1025   Sepsis Labs Invalid input(s): PROCALCITONIN,  WBC,  LACTICIDVEN   Time coordinating discharge: 45 minutes  SIGNED:  Mercy Riding, MD  [facility] 05/10/2020, 12:07 PM  If 7PM-7AM, please contact night-coverage [email or web address]

## 2020-05-10 NOTE — Discharge Instructions (Signed)
Heart Failure, Self Care Heart failure is a serious condition. This sheet explains things you need to do to take care of yourself at home. To help you stay as healthy as possible, you may be asked to change your diet, take certain medicines, and make other changes in your life. Your doctor may also give you more specific instructions. If you have problems or questions, call your doctor. What are the risks? Having heart failure makes it more likely for you to have some problems. These problems can get worse if you do not take good care of yourself. Problems may include:  Blood clotting problems. This may cause a stroke.  Damage to the kidneys, liver, or lungs.  Abnormal heart rhythms. Supplies needed:  Scale for weighing yourself.  Blood pressure monitor.  Notebook.  Medicines. How to care for yourself when you have heart failure Medicines Take over-the-counter and prescription medicines only as told by your doctor. Take your medicines every day.  Do not stop taking your medicine unless your doctor tells you to do so.  Do not skip any medicines.  Get your prescriptions refilled before you run out of medicine. This is important. Eating and drinking   Eat heart-healthy foods. Talk with a diet specialist (dietitian) to create an eating plan.  Choose foods that: ? Have no trans fat. ? Are low in saturated fat and cholesterol.  Choose healthy foods, such as: ? Fresh or frozen fruits and vegetables. ? Fish. ? Low-fat (lean) meats. ? Legumes, such as beans, peas, and lentils. ? Fat-free or low-fat dairy products. ? Whole-grain foods. ? High-fiber foods.  Limit salt (sodium) if told by your doctor. Ask your diet specialist to tell you which seasonings are healthy for your heart.  Cook in healthy ways instead of frying. Healthy ways of cooking include roasting, grilling, broiling, baking, poaching, steaming, and stir-frying.  Limit how much fluid you drink, if told by your  doctor. Alcohol use  Do not drink alcohol if: ? Your doctor tells you not to drink. ? Your heart was damaged by alcohol, or you have very bad heart failure. ? You are pregnant, may be pregnant, or are planning to become pregnant.  If you drink alcohol: ? Limit how much you use to:  0-1 drink a day for women.  0-2 drinks a day for men. ? Be aware of how much alcohol is in your drink. In the [place]., one drink equals one 12 oz bottle of beer (355 mL), one 5 oz glass of wine (148 mL), or one 1 oz glass of hard liquor (44 mL). Lifestyle   Do not use any products that contain nicotine or tobacco, such as cigarettes, e-cigarettes, and chewing tobacco. If you need help quitting, ask your doctor. ? Do not use nicotine gum or patches before talking to your doctor.  Do not use illegal drugs.  Lose weight if told by your doctor.  Do physical activity if told by your doctor. Talk to your doctor before you begin an exercise if: ? You are an older adult. ? You have very bad heart failure.  Learn to manage stress. If you need help, ask your doctor.  Get rehab (rehabilitation) to help you stay independent and to help with your quality of life.  Plan time to rest when you get tired. Check weight and blood pressure   Weigh yourself every day. This will help you to know if fluid is building up in your body. ? Weigh yourself every morning   after you pee (urinate) and before you eat breakfast. ? Wear the same amount of clothing each time. ? Write down your daily weight. Give your record to your doctor.  Check and write down your blood pressure as told by your doctor.  Check your pulse as told by your doctor. Dealing with very hot and very cold weather  If it is very hot: ? Avoid activities that take a lot of energy. ? Use air conditioning or fans, or find a cooler place. ? Avoid caffeine and alcohol. ? Wear clothing that is loose-fitting, lightweight, and light-colored.  If it is very  cold: ? Avoid activities that take a lot of energy. ? Layer your clothes. ? Wear mittens or gloves, a hat, and a scarf when you go outside. ? Avoid alcohol. Follow these instructions at home:  Stay up to date with shots (vaccines). Get pneumococcal and flu (influenza) shots.  Keep all follow-up visits as told by your doctor. This is important. Contact a doctor if:  You gain weight quickly.  You have increasing shortness of breath.  You cannot do your normal activities.  You get tired easily.  You cough a lot.  You don't feel like eating or feel like you may vomit (nauseous).  You become puffy (swell) in your hands, feet, ankles, or belly (abdomen).  You cannot sleep well because it is hard to breathe.  You feel like your heart is beating fast (palpitations).  You get dizzy when you stand up. Get help right away if:  You have trouble breathing.  You or someone else notices a change in your behavior, such as having trouble staying awake.  You have chest pain or discomfort.  You pass out (faint). These symptoms may be an emergency. Do not wait to see if the symptoms will go away. Get medical help right away. Call your local emergency services (911 in the [place].S.). Do not drive yourself to the hospital. Summary  Heart failure is a serious condition. To care for yourself, you may have to change your diet, take medicines, and make other lifestyle changes.  Take your medicines every day. Do not stop taking them unless your doctor tells you to do so.  Eat heart-healthy foods, such as fresh or frozen fruits and vegetables, fish, lean meats, legumes, fat-free or low-fat dairy products, and whole-grain or high-fiber foods.  Ask your doctor if you can drink alcohol. You may have to stop alcohol use if you have very bad heart failure.  Contact your doctor if you gain weight quickly or feel that your heart is beating too fast. Get help right away if you pass out, or have chest pain  or trouble breathing. This information is not intended to replace advice given to you by your health care provider. Make sure you discuss any questions you have with your health care provider. Document Revised: 10/19/2018 Document Reviewed: 10/20/2018 Elsevier Patient Education  [place].   Heart Attack A heart attack occurs when blood and oxygen supply to the heart is cut off. A heart attack causes damage to the heart that cannot be fixed. A heart attack is also called a myocardial infarction, or MI. If you think you are having a heart attack, do not wait to see if the symptoms will go away. Get medical help right away. What are the causes? This condition may be caused by:  A fatty substance (plaque) in the blood vessels (arteries). This can block the flow of blood to the  heart.  A blood clot in the blood vessels that go to the heart. The blood clot blocks blood flow.  Low blood pressure.  An abnormal heartbeat.  Some diseases, such as problems in red blood cells (anemia)orproblems in breathing (respiratory failure).  Tightening (spasm) of a blood vessel that cuts off blood to the heart.  A tear in a blood vessel of the heart.  High blood pressure. What increases the risk? The following factors may make you more likely to develop this condition:  Aging. The older you are, the higher your risk.  Having a personal or family history of chest pain, heart attack, stroke, or narrowing of the arteries in the legs, arms, head, or stomach (peripheral artery disease).  Being female.  Smoking.  Not getting regular exercise.  Being overweight or obese.  Having high blood pressure.  Having high cholesterol.  Having diabetes.  Drinking too much alcohol.  Using illegal drugs, such as cocaine or methamphetamine. What are the signs or symptoms? Symptoms of this condition include:  Chest pain. It may feel like: ? Crushing or squeezing. ? Tightness, pressure, fullness, or  heaviness.  Pain in the arm, neck, jaw, back, or upper body.  Shortness of breath.  Heartburn.  Upset stomach (indigestion).  Feeling like you may vomit (nauseous).  Cold sweats.  Feeling tired.  Sudden light-headedness. How is this treated? A heart attack must be treated as soon as possible. Treatment may include:  Medicines to: ? Break up or dissolve blood clots. ? Thin blood and help prevent blood clots. ? Treat blood pressure. ? Improve blood flow to the heart. ? Reduce pain. ? Reduce cholesterol.  Procedures to widen a blocked artery and keep it open.  Open heart surgery.  Receiving oxygen.  Making your heart strong again (cardiac rehabilitation) through exercise, education, and counseling. Follow these instructions at home: Medicines  Take over-the-counter and prescription medicines only as told by your doctor. You may need to take medicine: ? To keep your blood from clotting too easily. ? To control blood pressure. ? To lower cholesterol. ? To control heart rhythms.  Do not take these medicines unless your doctor says it is okay: ? NSAIDs, such as ibuprofen. ? Supplements that have vitamin A, vitamin E, or both. ? Hormone replacement therapy that has estrogen with or without progestin. Lifestyle      Do not use any products that have nicotine or tobacco, such as cigarettes, e-cigarettes, and chewing tobacco. If you need help quitting, ask your doctor.  Avoid secondhand smoke.  Exercise regularly. Ask your doctor about a cardiac rehab program.  Eat heart-healthy foods. Your doctor will tell you what foods to eat.  Stay at a healthy weight.  Lower your stress level.  Do not use illegal drugs. Alcohol use  Do not drink alcohol if: ? Your doctor tells you not to drink. ? You are pregnant, may be pregnant, or are planning to become pregnant.  If you drink alcohol: ? Limit how much you use to:  0-1 drink a day for women.  0-2 drinks a day  for men. ? Know how much alcohol is in your drink. In the [place] one drink equals one 12 oz bottle of beer (355 mL), one 5 oz glass of wine (148 mL), or one 1 oz glass of hard liquor (44 mL). General instructions  Work with your doctor to treat other problems you may have, such as diabetes or high blood pressure.  Get screened for depression.  Get treatment if needed.  Keep your vaccines up to date. Get the flu shot (influenza vaccine) every year.  Keep all follow-up visits as told by your doctor. This is important. Contact a doctor if:  You feel very sad.  You have trouble doing your daily activities. Get help right away if:  You have sudden, unexplained discomfort in your chest, arms, back, neck, jaw, or upper body.  You have shortness of breath.  You have sudden sweating or clammy skin.  You feel like you may vomit.  You vomit.  You feel tired or weak.  You get light-headed or dizzy.  You feel your heart beating fast.  You feel your heart skipping beats.  You have blood pressure that is higher than 180/120. These symptoms may be an emergency. Do not wait to see if the symptoms will go away. Get medical help right away. Call your local emergency services (911 in the [place] Do not drive yourself to the hospital. Summary  A heart attack occurs when blood and oxygen supply to the heart is cut off.  Do not take NSAIDs unless your doctor says it is okay.  Do not smoke. Avoid secondhand smoke.  Exercise regularly. Ask your doctor about a cardiac rehab program. This information is not intended to replace advice given to you by your health care provider. Make sure you discuss any questions you have with your health care provider. Document Revised: 10/18/2018 Document Reviewed: 10/18/2018 Elsevier Patient Education  [place].

## 2020-05-10 NOTE — Consult Note (Signed)
   [facility] Inpatient Consult   05/10/2020  SHAYLIN BLATT 1933-03-29 [id number]  Update:  Patient's disposition changed to home  Call placed to patient's room with no answer to offer post hospital support with [facility]. Call placed to desk and states patient just left unit for home.  Plan: Will assign to [facility] for follow up support since deciding to return home instead of previous disposition to SNF.  Natividad Brood, RN BSN [place] [facility] Liaison  [phone] business mobile phone Toll free office [phone]  Fax number: [phone] [email or web address]

## 2020-05-14 DIAGNOSIS — M8580 Other specified disorders of bone density and structure, unspecified site: Secondary | ICD-10-CM | POA: Diagnosis not present

## 2020-05-14 DIAGNOSIS — D62 Acute posthemorrhagic anemia: Secondary | ICD-10-CM | POA: Diagnosis not present

## 2020-05-14 DIAGNOSIS — Z48812 Encounter for surgical aftercare following surgery on the circulatory system: Secondary | ICD-10-CM | POA: Diagnosis not present

## 2020-05-14 DIAGNOSIS — R0982 Postnasal drip: Secondary | ICD-10-CM | POA: Diagnosis not present

## 2020-05-14 DIAGNOSIS — I214 Non-ST elevation (NSTEMI) myocardial infarction: Secondary | ICD-10-CM | POA: Diagnosis not present

## 2020-05-14 DIAGNOSIS — K219 Gastro-esophageal reflux disease without esophagitis: Secondary | ICD-10-CM | POA: Diagnosis not present

## 2020-05-14 DIAGNOSIS — F329 Major depressive disorder, single episode, unspecified: Secondary | ICD-10-CM | POA: Diagnosis not present

## 2020-05-14 DIAGNOSIS — Z7982 Long term (current) use of aspirin: Secondary | ICD-10-CM | POA: Diagnosis not present

## 2020-05-14 DIAGNOSIS — E876 Hypokalemia: Secondary | ICD-10-CM | POA: Diagnosis not present

## 2020-05-14 DIAGNOSIS — S72011D Unspecified intracapsular fracture of right femur, subsequent encounter for closed fracture with routine healing: Secondary | ICD-10-CM | POA: Diagnosis not present

## 2020-05-14 DIAGNOSIS — I255 Ischemic cardiomyopathy: Secondary | ICD-10-CM | POA: Diagnosis not present

## 2020-05-14 DIAGNOSIS — I2510 Atherosclerotic heart disease of native coronary artery without angina pectoris: Secondary | ICD-10-CM | POA: Diagnosis not present

## 2020-05-14 DIAGNOSIS — M479 Spondylosis, unspecified: Secondary | ICD-10-CM | POA: Diagnosis not present

## 2020-05-14 DIAGNOSIS — Z96641 Presence of right artificial hip joint: Secondary | ICD-10-CM | POA: Diagnosis not present

## 2020-05-14 DIAGNOSIS — Z955 Presence of coronary angioplasty implant and graft: Secondary | ICD-10-CM | POA: Diagnosis not present

## 2020-05-14 DIAGNOSIS — D649 Anemia, unspecified: Secondary | ICD-10-CM | POA: Diagnosis not present

## 2020-05-14 DIAGNOSIS — M160 Bilateral primary osteoarthritis of hip: Secondary | ICD-10-CM | POA: Diagnosis not present

## 2020-05-14 DIAGNOSIS — I110 Hypertensive heart disease with heart failure: Secondary | ICD-10-CM | POA: Diagnosis not present

## 2020-05-14 DIAGNOSIS — I10 Essential (primary) hypertension: Secondary | ICD-10-CM | POA: Diagnosis not present

## 2020-05-14 DIAGNOSIS — F419 Anxiety disorder, unspecified: Secondary | ICD-10-CM | POA: Diagnosis not present

## 2020-05-14 DIAGNOSIS — K5790 Diverticulosis of intestine, part unspecified, without perforation or abscess without bleeding: Secondary | ICD-10-CM | POA: Diagnosis not present

## 2020-05-14 DIAGNOSIS — N179 Acute kidney failure, unspecified: Secondary | ICD-10-CM | POA: Diagnosis not present

## 2020-05-14 DIAGNOSIS — I5033 Acute on chronic diastolic (congestive) heart failure: Secondary | ICD-10-CM | POA: Diagnosis not present

## 2020-05-14 DIAGNOSIS — Z7902 Long term (current) use of antithrombotics/antiplatelets: Secondary | ICD-10-CM | POA: Diagnosis not present

## 2020-05-14 DIAGNOSIS — I447 Left bundle-branch block, unspecified: Secondary | ICD-10-CM | POA: Diagnosis not present

## 2020-05-14 DIAGNOSIS — K5900 Constipation, unspecified: Secondary | ICD-10-CM | POA: Diagnosis not present

## 2020-05-15 ENCOUNTER — Other Ambulatory Visit: Payer: Self-pay | Admitting: *Deleted

## 2020-05-15 NOTE — Patient Outreach (Signed)
[facility]) Care Management  05/15/2020  Patricia Garcia 1932/07/31 [id number]   [facility] Telephone Assessment post hospital referral  Referral Date: 05/11/20 Referral Source: Patricia Garcia, [facility] RN CM hospital liaison Referral Reason: Please assign to [place] Coordinator for complex care PCP does the [facility] follow up and disease management follow up calls and assess for further needs  Insurance: [employer] Medicare and blue cross and blue shield supplement  Transition of care services noted to be completed by primary care MD office staff- Dr Patricia Garcia  Transition of Care will be completed by primary care provider office who will refer to [facility] care management if needed.   Outreach attempt # 1 to home number  No answer. [facility] RN CM unable to leave a HIPAA ([place]) compliant voicemail message along with CM's contact info.   Plan: [facility] RN CM sent an unsuccessful outreach letter and scheduled this patient for another call attempt within 4-7 business days    Patricia Garcia L. Patricia Hamman, RN, BSN, [place] Coordinator Office number [phone] Mobile number [phone]  Main [facility] number [phone] Fax number [phone]

## 2020-05-17 ENCOUNTER — Other Ambulatory Visit: Payer: Self-pay | Admitting: *Deleted

## 2020-05-17 DIAGNOSIS — S72011D Unspecified intracapsular fracture of right femur, subsequent encounter for closed fracture with routine healing: Secondary | ICD-10-CM | POA: Diagnosis not present

## 2020-05-17 DIAGNOSIS — I2510 Atherosclerotic heart disease of native coronary artery without angina pectoris: Secondary | ICD-10-CM | POA: Diagnosis not present

## 2020-05-17 DIAGNOSIS — I255 Ischemic cardiomyopathy: Secondary | ICD-10-CM | POA: Diagnosis not present

## 2020-05-17 DIAGNOSIS — I214 Non-ST elevation (NSTEMI) myocardial infarction: Secondary | ICD-10-CM | POA: Diagnosis not present

## 2020-05-17 DIAGNOSIS — I110 Hypertensive heart disease with heart failure: Secondary | ICD-10-CM | POA: Diagnosis not present

## 2020-05-17 DIAGNOSIS — I5033 Acute on chronic diastolic (congestive) heart failure: Secondary | ICD-10-CM | POA: Diagnosis not present

## 2020-05-17 NOTE — Patient Outreach (Signed)
[facility]) Care Management  05/17/2020  Patricia Garcia Nov 06, 1932 [id number]   [facility] Telephone Assessment post hospital referral  Referral Date: 05/11/20 Referral Source: Natividad Brood, [facility] RN CM hospital liaison Referral Reason: Please assign to [place] Coordinator for complex care PCP does the [facility] follow up and disease management follow up calls and assess for further needs  Insurance: NextGen Medicare and blue cross and blue shield supplement  Transition of care services noted to be completed by primary care MD office staff- Dr Lajean Manes  Transition of Care will be completed by primary care provider office who will refer to [facility] care management if needed.   Outreach attempt # 2 A to home number  No answer. [facility] RN CM unable to leave a HIPAA ([place]) compliant voicemail message along with CM's contact info.   Outreach attempt # 2 B to mobile number  No answer. [facility] RN CM left a HIPAA [employer] Portability and Accountability Act) compliant voicemail message along with CM's contact info.   Plan: [facility] RN CM scheduled this patient for another call attempt within 4-7 business days [facility] RN CM sent an unsuccessful outreach letter on 05/15/20 after an unsuccessful outreach to her home number     [place]. Lavina Hamman, RN, BSN, [place] Coordinator Office number [phone] Mobile number [phone]  Main [facility] number [phone] Fax number [phone]

## 2020-05-18 DIAGNOSIS — I214 Non-ST elevation (NSTEMI) myocardial infarction: Secondary | ICD-10-CM | POA: Diagnosis not present

## 2020-05-18 DIAGNOSIS — I2510 Atherosclerotic heart disease of native coronary artery without angina pectoris: Secondary | ICD-10-CM | POA: Diagnosis not present

## 2020-05-18 DIAGNOSIS — I255 Ischemic cardiomyopathy: Secondary | ICD-10-CM | POA: Diagnosis not present

## 2020-05-18 DIAGNOSIS — I110 Hypertensive heart disease with heart failure: Secondary | ICD-10-CM | POA: Diagnosis not present

## 2020-05-18 DIAGNOSIS — I5033 Acute on chronic diastolic (congestive) heart failure: Secondary | ICD-10-CM | POA: Diagnosis not present

## 2020-05-18 DIAGNOSIS — S72011D Unspecified intracapsular fracture of right femur, subsequent encounter for closed fracture with routine healing: Secondary | ICD-10-CM | POA: Diagnosis not present

## 2020-05-19 ENCOUNTER — Encounter: Payer: Self-pay | Admitting: Physician Assistant

## 2020-05-19 NOTE — Progress Notes (Signed)
Cardiology Office Note    Date:  05/22/2020   ID:  KENIDEE CREGAN, DOB 09-09-1932, MRN [id number]  PCP:  Lajean Manes, MD  Cardiologist:  Freada Bergeron, MD  Electrophysiologist:  None   Chief Complaint: f/u CHF  History of Present Illness:   Patricia Garcia is a 86 y.o. female with history of LBBB, hypertension, diverticulosis, arthritis, depression, right femur fracture s/p THA 05/02/2020, depression, osteopenia, chronic diastolic CHF, CAD s/p recent NSTEMI who presents for post hospital follow-up.  Per chart review the patient had previously had a history of ischemic cardiomyopathy listed in her chart but she herself was not aware of this.  Her primary care doctor had ordered a nuclear stress test in February 2021 which was low risk, no ischemia, EF 46%.   More recently, she had a mechanical fall resulting in hip fracture requiring surgery on 05/02/20.  She was readmitted to [facility] 05/06/20 with nausea, vomiting, and hypoxia.  She was ultimately found to have a non-STEMI (trops in 3k range) and acute on chronic diastolic heart failure.  She was treated with IV Lasix.  2D echo 05/07/2020 showed hypokinesis of the basal inferior and inferolateral wall, EF 50 to 38%, grade 2 diastolic dysfunction, elevated left atrial pressure, mild mitral regurgitation.  She underwent cardiac cath 05/08/2020 showing three-vessel obstructive coronary artery disease as outlined below ultimately status post PCI of the mid RCA and first diagonal with drug-eluting stenting.  Hospitalization was also notable for anemia requiring blood transfusion, hypokalemia, gait instability, hypokalemia, hypoalbuminemia, mild AKI, and nocturnal confusion.  She is seen back for follow-up doing great! She is accompanied by her son who assists with her medical care. She has been gradually increasing her activity, participating in PT/OT, eating well and sleeping well without any issues. She has had rare short-lived  dyspnea she feels is related to Brilinta as it quickly improves with a little bit of Coca[employer]Col[employer]. She has no edema, orthopnea or dizziness. They saw PCP this AM and repeat labs looked good (as below). They'll be seeing Dr. Delfino Lovett later today.   Labwork independently reviewed: 05/22/20 (son showed labs from portal) K 4.6, Cr 114, glucose 102, Hgb 11, Plt 570 05/10/20 Mg 2.1, K 3.3, Cr 1.28, albumin 2.7, Hgb 8.9, LDL 103   Past Medical History:  Diagnosis Date  . Anal or rectal pain   . Anemia   . Arthritis   . CAD (coronary artery disease)    a. NSTEMI 04/2020 s/p DES to [facility] and D1.  . Chronic diastolic CHF (congestive heart failure) ([place])   . Depression   . Diverticulosis of colon (without mention of hemorrhage)   . Hypertension   . Hypoalbuminemia   . Internal hemorrhoids without mention of complication   . Ischemic heart disease   . LBBB (left bundle branch block)   . Osteopenia   . Renal insufficiency   . S/P right hip fracture   . Slow transit constipation     Past Surgical History:  Procedure Laterality Date  . ABDOMINAL HYSTERECTOMY    . CATARACT EXTRACTION, BILATERAL    . CORONARY STENT INTERVENTION N/A 05/08/2020   Procedure: CORONARY STENT INTERVENTION;  Surgeon: Martinique, Peter M, MD;  Location: [place] CV LAB;  Service: Cardiovascular;  Laterality: N/A;  . LEFT HEART CATH AND CORONARY ANGIOGRAPHY N/A 05/08/2020   Procedure: LEFT HEART CATH AND CORONARY ANGIOGRAPHY;  Surgeon: Martinique, Peter M, MD;  Location: [place] CV LAB;  Service: Cardiovascular;  Laterality:  N/A;  . MASS EXCISION  06/10/2011   Procedure: EXCISION MASS;  Surgeon: Wynonia Sours, MD;  Location: [place];  Service: Orthopedics;  Laterality: Right;  excision cyst right thumb IP, debridement IP right thumb  . TONSILLECTOMY    . TOTAL HIP ARTHROPLASTY Right 05/02/2020   Procedure: TOTAL HIP ARTHROPLASTY ANTERIOR APPROACH;  Surgeon: Rod Can, MD;  Location: [facility];  Service:  Orthopedics;  Laterality: Right;    Current Medications: Current Meds  Medication Sig  . acetaminophen (TYLENOL) 500 MG tablet Take 1 tablet (500 mg total) by mouth every 8 (eight) hours as needed for mild pain, moderate pain, fever or headache.  . ALPRAZolam (XANAX) 0.25 MG tablet Take 0.025 mg by mouth at bedtime as needed for anxiety.   Marland Kitchen aspirin (ASPIRIN CHILDRENS) 81 MG chewable tablet Chew 1 tablet (81 mg total) by mouth 2 (two) times daily with a meal.  . Calcium Carbonate-Vit D-Min (CALCIUM 1200 PO) Take 1,200 mg by mouth daily.  . cholecalciferol (VITAMIN D) 1000 UNITS tablet Take 1,000 Units by mouth daily.    . citalopram (CELEXA) 20 MG tablet Take 20 mg by mouth daily.    . cloNIDine (CATAPRES) 0.1 MG tablet Take 0.1 mg by mouth daily.   Marland Kitchen docusate sodium (COLACE) 100 MG capsule Take 1 capsule (100 mg total) by mouth 2 (two) times daily.  Marland Kitchen ezetimibe (ZETIA) 10 MG tablet Take 1 tablet (10 mg total) by mouth daily.  . famotidine (PEPCID) 40 MG tablet Take 40 mg by mouth daily.  . ferrous sulfate 325 (65 FE) MG tablet Take 325 mg by mouth See admin instructions. 3 times a week  . latanoprost (XALATAN) 0.005 % ophthalmic solution Place 1 drop into both eyes at bedtime.  . metoprolol succinate (TOPROL-XL) 25 MG 24 hr tablet Take 1 tablet (25 mg total) by mouth daily.  . multivitamin-iron-minerals-folic acid (CENTRUM) chewable tablet Chew 1 tablet by mouth daily.    . Naphazoline-Pheniramine (OPCON-A OP) Place 1 drop into both eyes daily as needed (for eye allergy).  Marland Kitchen OLIVE LEAF EXTRACT PO Take 1 tablet by mouth daily.  . Omega-3 1000 MG CAPS Take 1,000 mg by mouth daily.  Marland Kitchen omeprazole (PRILOSEC) 20 MG capsule Take 1 capsule (20 mg total) by mouth daily.  Marland Kitchen senna (SENOKOT) 8.6 MG TABS tablet Take 1 tablet (8.6 mg total) by mouth daily as needed for mild constipation.  . sodium chloride (MURO 128) 2 % ophthalmic solution Place 1 drop into both eyes 2 (two) times daily as needed for  eye irritation.  . ticagrelor (BRILINTA) 90 MG TABS tablet Take 1 tablet (90 mg total) by mouth 2 (two) times daily.      Allergies:   Penicillins, Prednisone, Statins, Sulfonamide derivatives, and Tramadol   Social History   Socioeconomic History  . Marital status: Widowed    Spouse name: Not on file  . Number of children: 2  . Years of education: Not on file  . Highest education level: Not on file  Occupational History  . Occupation: Retired    Fish farm manager: RETIRED  Tobacco Use  . Smoking status: Former Smoker    Quit date: 06/05/1984    Years since quitting: 35.9  . Smokeless tobacco: Never Used  Substance and Sexual Activity  . Alcohol use: No  . Drug use: No  . Sexual activity: Not on file  Other Topics Concern  . Not on file  Social History Narrative  . Not on file  Social Determinants of Health   Financial Resource Strain:   . Difficulty of Paying Living Expenses: Not on file  Food Insecurity:   . Worried About Charity fundraiser in the Last Year: Not on file  . Ran Out of Food in the Last Year: Not on file  Transportation Needs:   . Lack of Transportation (Medical): Not on file  . Lack of Transportation (Non-Medical): Not on file  Physical Activity:   . Days of Exercise per Week: Not on file  . Minutes of Exercise per Session: Not on file  Stress:   . Feeling of Stress : Not on file  Social Connections:   . Frequency of Communication with Friends and Family: Not on file  . Frequency of Social Gatherings with Friends and Family: Not on file  . Attends Religious Services: Not on file  . Active Member of Clubs or Organizations: Not on file  . Attends Archivist Meetings: Not on file  . Marital Status: Not on file     Family History:  The patient's family history includes Cancer in her father; Colon polyps in her mother; Heart disease in her father.  ROS:   Please see the history of present illness.   All other systems are reviewed and  otherwise negative.    EKGs/Labs/Other Studies Reviewed:    Studies reviewed are outlined and summarized above. Reports included below if pertinent.  Cardiac Cath 05/08/20  1st Diag lesion is 85% stenosed.  Ramus lesion is 30% stenosed.  Prox Cx to Mid Cx lesion is 90% stenosed.  Prox RCA to Mid RCA lesion is 95% stenosed.  Post intervention, there is a 0% residual stenosis.  A drug-eluting stent was successfully placed using a STENT RESOLUTE ONYX 3.0X15.  Post intervention, there is a 0% residual stenosis.  A drug-eluting stent was successfully placed using a [place] 2.25X15.  LV end diastolic pressure is mildly elevated.   1. 3 vessel obstructive CAD    - 95% mid RCA- this appeared to be the culprit lesion    - 85% proxima first diagonal    - 90% very small LCx in the AV groove 2. Mildly elevated LVEDP 3. Successful PCI of the mid RCA with DES x 1 4. Successful PCI of the first diagonal with DES x 1  Plan: DAPT for one year. Will transition lasix to po. Further plans per primary team.     EKG:  EKG is ordered today, personally reviewed, demonstrating NSR 66bpm, LBBB, first degree AVB without significant change from prior  Recent Labs: 05/06/2020: ALT 24; B Natriuretic Peptide 1,221.6 05/10/2020: BUN 16; Creatinine, Ser 1.28; Hemoglobin 8.9; Magnesium 2.1; Platelets 361; Potassium 3.3; Sodium 136  Recent Lipid Panel    Component Value Date/Time   CHOL 166 05/08/2020 0457   TRIG 126 05/08/2020 0457   HDL 38 (L) 05/08/2020 0457   CHOLHDL 4.4 05/08/2020 0457   VLDL 25 05/08/2020 0457   LDLCALC 103 (H) 05/08/2020 0457    PHYSICAL EXAM:    VS:  BP (!) 144/62   Pulse 66   Ht 5\' 3"  (1.6 m)   Wt 126 lb 12.8 oz (57.5 kg)   SpO2 99%   BMI 22.46 kg/m   BMI: Body mass index is 22.46 kg/m.  GEN: Well nourished, well developed cheerful WF, in no acute distress HEENT: normocephalic, atraumatic Neck: no JVD, carotid bruits, or masses Cardiac: RRR; no  murmurs, rubs, or gallops, no edema  Respiratory:  clear to auscultation bilaterally, normal work of breathing GI: soft, nontender, nondistended, + BS MS: no deformity or atrophy Skin: warm and dry, no rash, right radial cath site without hematoma or ecchymosis; good pulse. Neuro:  Alert and Oriented x 3, Strength and sensation are intact, follows commands Psych: euthymic mood, full affect  Wt Readings from Last 3 Encounters:  05/22/20 126 lb 12.8 oz (57.5 kg)  05/10/20 128 lb 6.4 oz (58.2 kg)  05/01/20 126 lb 4.8 oz (57.3 kg)     ASSESSMENT & PLAN:   1. CAD with recent NSTEMI - doing very well post PCI. Cath site fine. She still had the tegaderm on as well as home co-ban wrapped around it. I removed this and told her she can now leave it open to air. She was discharged on ASA BID which should be daily - will make that change today. Continue Brilinta. She has had some mild dyspnea but has learned that a small amount of caffeine resolves the issue. She will let us know if this becomes more frequent or bothersome. Given advanced age and weight <60kg she would not be a candidate for Effient but Plavix would be an alternative. Otherwise continue BB and Zetia. Will recheck liver/lipids in 8 weeks. If LDL is suboptimally controlled, could consider PCSK9i. I would wait to see how she does on the Zetia first. 2. HTN - Her blood pressure is running slightly higher than goal today in clinic but earlier today was 124/72. Given advanced age and normal diastolic value, would continue present regimen. Instructions given for periodically following BP at home and calling if tending to run consistently over 505 systolic for our review. Of note she is chronically on clonidine which appears at the direction of primary care. 3. Chronic diastolic CHF - appears euvolemic on examination. Does not require diuretic therapy at this time. She is watching salt intake. 4. Acute kidney injury - improved by recheck labs at PCP  today. 5. Anemia - improving by recheck labs at PCP today. Decrease ASA to 81mg  once daily as above. Continue Brilinta. Anticipate periodic surveillance of this. This was likely exacerbated by her recent surgery.  Disposition: F/u with Dr. Johney Frame in 3-4 months.  Medication Adjustments/Labs and Tests Ordered: Current medicines are reviewed at length with the patient today.  Concerns regarding medicines are outlined above. Medication changes, Labs and Tests ordered today are summarized above and listed in the Patient Instructions accessible in Encounters.   Signed, Charlie Pitter, PA-C  05/22/2020 2:35 PM    [facility] [place], [place], [place]  [address] Phone: [phone]; Fax: [phone]

## 2020-05-22 ENCOUNTER — Ambulatory Visit (INDEPENDENT_AMBULATORY_CARE_PROVIDER_SITE_OTHER): Payer: Medicare Other | Admitting: Physician Assistant

## 2020-05-22 ENCOUNTER — Encounter: Payer: Self-pay | Admitting: Physician Assistant

## 2020-05-22 ENCOUNTER — Other Ambulatory Visit: Payer: Self-pay

## 2020-05-22 VITALS — BP 144/62 | HR 66 | Ht 63.0 in | Wt 126.8 lb

## 2020-05-22 DIAGNOSIS — I2510 Atherosclerotic heart disease of native coronary artery without angina pectoris: Secondary | ICD-10-CM

## 2020-05-22 DIAGNOSIS — D649 Anemia, unspecified: Secondary | ICD-10-CM | POA: Diagnosis not present

## 2020-05-22 DIAGNOSIS — I10 Essential (primary) hypertension: Secondary | ICD-10-CM

## 2020-05-22 DIAGNOSIS — I214 Non-ST elevation (NSTEMI) myocardial infarction: Secondary | ICD-10-CM | POA: Diagnosis not present

## 2020-05-22 DIAGNOSIS — I5032 Chronic diastolic (congestive) heart failure: Secondary | ICD-10-CM | POA: Diagnosis not present

## 2020-05-22 DIAGNOSIS — N1831 Chronic kidney disease, stage 3a: Secondary | ICD-10-CM | POA: Diagnosis not present

## 2020-05-22 DIAGNOSIS — N179 Acute kidney failure, unspecified: Secondary | ICD-10-CM

## 2020-05-22 DIAGNOSIS — Z79899 Other long term (current) drug therapy: Secondary | ICD-10-CM | POA: Diagnosis not present

## 2020-05-22 DIAGNOSIS — S72031D Displaced midcervical fracture of right femur, subsequent encounter for closed fracture with routine healing: Secondary | ICD-10-CM | POA: Diagnosis not present

## 2020-05-22 DIAGNOSIS — I129 Hypertensive chronic kidney disease with stage 1 through stage 4 chronic kidney disease, or unspecified chronic kidney disease: Secondary | ICD-10-CM | POA: Diagnosis not present

## 2020-05-22 MED ORDER — ASPIRIN EC 81 MG PO TBEC: 81.0000 mg | DELAYED_RELEASE_TABLET | Freq: Every day | ORAL | 3 refills | Status: DC

## 2020-05-22 NOTE — Patient Instructions (Signed)
Medication Instructions:  Your physician has recommended you make the following change in your medication:  1.  REDUCE the Aspirin to 1 tablet daily  *If you need a refill on your cardiac medications before your next appointment, please call your pharmacy*   Lab Work: 8 WEEKS:  FASTING LIPID & LFT  If you have labs (blood work) drawn today and your tests are completely normal, you will receive your results only by: Marland Kitchen MyChart Message (if you have MyChart) OR . A paper copy in the mail If you have any lab test that is abnormal or we need to change your treatment, we will call you to review the results.   Testing/Procedures: None ordered   Follow-Up: At [facility], you and your health needs are our priority.  As part of our continuing mission to provide you with exceptional heart care, we have created designated Provider Care Teams.  These Care Teams include your primary Cardiologist (physician) and Advanced Practice Providers (APPs -  Physician Assistants and Nurse Practitioners) who all work together to provide you with the care you need, when you need it.  We recommend signing up for the patient portal called "MyChart".  Sign up information is provided on this After Visit Summary.  MyChart is used to connect with patients for Virtual Visits (Telemedicine).  Patients are able to view lab/test results, encounter notes, upcoming appointments, etc.  Non-urgent messages can be sent to your provider as well.   To learn more about what you can do with MyChart, go to [email or web address].    Your next appointment:   3 month(s)  The format for your next appointment:   In Person  Provider:   Gwyndolyn Kaufman, MD   Other Instructions

## 2020-05-23 DIAGNOSIS — I2510 Atherosclerotic heart disease of native coronary artery without angina pectoris: Secondary | ICD-10-CM | POA: Diagnosis not present

## 2020-05-23 DIAGNOSIS — I5033 Acute on chronic diastolic (congestive) heart failure: Secondary | ICD-10-CM | POA: Diagnosis not present

## 2020-05-23 DIAGNOSIS — I255 Ischemic cardiomyopathy: Secondary | ICD-10-CM | POA: Diagnosis not present

## 2020-05-23 DIAGNOSIS — I110 Hypertensive heart disease with heart failure: Secondary | ICD-10-CM | POA: Diagnosis not present

## 2020-05-23 DIAGNOSIS — S72011D Unspecified intracapsular fracture of right femur, subsequent encounter for closed fracture with routine healing: Secondary | ICD-10-CM | POA: Diagnosis not present

## 2020-05-23 DIAGNOSIS — I214 Non-ST elevation (NSTEMI) myocardial infarction: Secondary | ICD-10-CM | POA: Diagnosis not present

## 2020-05-24 DIAGNOSIS — I2510 Atherosclerotic heart disease of native coronary artery without angina pectoris: Secondary | ICD-10-CM | POA: Diagnosis not present

## 2020-05-24 DIAGNOSIS — I214 Non-ST elevation (NSTEMI) myocardial infarction: Secondary | ICD-10-CM | POA: Diagnosis not present

## 2020-05-24 DIAGNOSIS — I5033 Acute on chronic diastolic (congestive) heart failure: Secondary | ICD-10-CM | POA: Diagnosis not present

## 2020-05-24 DIAGNOSIS — I255 Ischemic cardiomyopathy: Secondary | ICD-10-CM | POA: Diagnosis not present

## 2020-05-24 DIAGNOSIS — I110 Hypertensive heart disease with heart failure: Secondary | ICD-10-CM | POA: Diagnosis not present

## 2020-05-24 DIAGNOSIS — S72011D Unspecified intracapsular fracture of right femur, subsequent encounter for closed fracture with routine healing: Secondary | ICD-10-CM | POA: Diagnosis not present

## 2020-05-25 ENCOUNTER — Other Ambulatory Visit: Payer: Self-pay | Admitting: *Deleted

## 2020-05-25 ENCOUNTER — Other Ambulatory Visit: Payer: Self-pay

## 2020-05-25 ENCOUNTER — Encounter: Payer: Self-pay | Admitting: *Deleted

## 2020-05-25 NOTE — Patient Outreach (Addendum)
[facility]) Care Management  05/25/2020  JOSHUA ZERINGUE Apr 17, 1933 [id number]   [facility] Telephone Assessmentpost hospitalreferral  Referral Date:05/11/20 Referral [place], [facility] RN CM hospital liaison Referral Reason:Please assign to [place] Coordinator for complex care PCP does the [facility] follow up and disease management follow up calls and assess for further needs  Insurance:NextGen Medicareandblue cross and blue shieldsupplement  Last admission 05/06/20 to 05/10/20 Non ST elevation myocardial infarction (NSTEMI), CHF  05/02/20 to 05/04/20 right femoral fractures/pTHA by Dr. Lyla Glassing d/c   Transition of care services noted to be completed by primary care MD office staff- Dr Lajean Manes Transition of Care will be completed by primary care provider office who will refer to [facility] care management if needed.   Outreach attempt # 2 Ato home [phone] No answer. [facility] RN CMunable to leave [employer] ([place] and Accountability Act) compliant voicemail message along with CM's contact info.   Outreach attempt # 2B to mobile [phone] Successful   Assessment/follow up Mrs Meulemans reports living with her son, [name] and daughter in Tour manager after hospital discharge vs her home  Pt reports she is doing well but "is a little slow " with mobility Has HH PT & OT visits She reports "walking good" and her [facility] OT to release her today She reports she is able to go up and down stairs slowly  She reports she is sleeping good She reports her son if her care provider He assist with medication administration and her daughter in law assists with transportation to medical appointments on Tuesdays and Thursdays but recently had a procedure she is recovering from. Mrs Paro reports she is unable to drive since her total hip replacement She reports her appetite is fair "coming back" On 05/22/20 she was seen by her primary care  provider (PCP), cardiology and orthopedist  She reports difficult putting on her sock and shoe. [facility] RN CM discussed a sock aid & shoe horn with her and encouraged her to speak with her [facility] OT staff. Discussed local stores and durable medical equipment (DME) stores that carry such items She voiced understanding and reports she will follow up with her [facility] OT or a local store.  She denies issues with obtaining medicines except her Brilinta. She states she noted having shortness of breath (sob) from use of Brilinta and was offered a recommendation from a hospital RN that helps  Brilinta taken with coca cola helps to decrease her symptoms  Denies constipation uses colace  Fell x 1 before admission She reports falling on basement steps, fell backwards going to get food out of her pantry. She has alert alarm device and generally carry her cell phone but on this day she did not have either. She reports a family friend found and assisted her after 2 hours  Alternative transportation resource need - Voiced interest in transportation resource for [place], wednesdays and [place] when her daughter in law may not be available   Past Medical History:  Diagnosis Date  . Anal or rectal pain   . Anemia   . Arthritis   . CAD (coronary artery disease)    a. NSTEMI 04/2020 s/p DES to [facility] and D1.  . Chronic diastolic CHF (congestive heart failure) ([place])   . Depression   . Diverticulosis of colon (without mention of hemorrhage)   . Hypertension   . Hypoalbuminemia   . Internal hemorrhoids without mention of complication   . Ischemic heart disease   .  LBBB (left bundle branch block)   . Osteopenia   . Renal insufficiency   . S/P right hip fracture   . Slow transit constipation     Plan: [facility] RN CM will follow up with Mrs Burrough in the next 7-10 business days Pt encouraged to return a call to [place] CM prn provided office number Collaborated with [facility] SW on medical transportation resources Goals Addressed               This Visit's Progress     Patient Stated   .  [facility]) Find Help in My Community (pt-stated)   On track     Follow Up Date 06/01/20   - call 211 when I need some help - follow-up on any referrals for help I am given - have a back-up plan     Notes:     .  ([facility]) Prevent Falls and Injury (pt-stated)   On track     Follow Up Date 06/01/20   - always use handrails on the stairs - always wear low-heeled or flat shoes or slippers with nonskid soles - keep my cell phone with me always - use a cane or walker - wear my glasses and/or hearing aid - attend therapy     Notes:     .  [facility]) Track and Manage Symptoms (pt-stated)   On track     Follow Up Date 06/01/20    - eat more whole grains, fruits and vegetables, lean meats and healthy fats - follow rescue plan if symptoms flare-up - know when to call the doctor - track symptoms and what helps feel better or worse - dress right for the weather, hot or cold     Notes:          Oluwaseun Bruyere L. Lavina Hamman, RN, BSN, [place] Coordinator Office number [phone] Mobile number [phone]  Main [facility] number [phone] Fax number [phone]

## 2020-06-01 ENCOUNTER — Other Ambulatory Visit: Payer: Self-pay | Admitting: *Deleted

## 2020-06-01 ENCOUNTER — Other Ambulatory Visit: Payer: Self-pay

## 2020-06-01 ENCOUNTER — Encounter: Payer: Self-pay | Admitting: *Deleted

## 2020-06-01 NOTE — Patient Outreach (Addendum)
[facility]) Care Management  06/01/2020  SHERIKA KUBICKI [birth year] [id number]   [facility] Telephone Assessmentpost hospitalreferral  Referral Date:05/11/20 Referral [place], [facility] RN CM hospital liaison Referral Reason:Please assign to [place] Coordinator for complex care PCP does the [facility] follow up and disease management follow up calls and assess for further needs  Insurance:NextGen Medicareandblue cross and blue shieldsupplement    Transition of care services noted to be completed by primary care MD office staff- Dr Lajean Manes Transition of Care will be completed by primary care provider office who will refer to [facility] care management if needed.   Outreach attempt #2 Ato home [phone] No answer. [facility] RN CMunable to leave [employer] ([place] and Accountability Act) compliant voicemail message along with CM's contact info.   Outreach attempt #[phone] Successful   Assessment/follow up Mrs Salberg reports she has now returned to her home after living with her son, [name] and daughter in law Maudie Mercury after hospital discharge vs her home  Her son, female friend and daughter in law are very supportive  She returned home on 05/27/20  She confirms she is not only seeing [facility] to see on Tuesday 06/05/20 Walking all over her home but does not go down any steps  She is anticipating completing [place] PT sessions soon She and son filled medicine container   Remain with a poor appetite  She reports weight loss She is 121 lbs at [place]"  Discussed supplements with protein like vegetables  like green beans Take in peanut butter, banana, yogurt  Right hip site without redness, pain The site has healed   She is going to schedule her own flu [facility] next week    Plans [facility] RN CM will follow up with patient within the next 30 business days Pt encouraged to return a call  to [place] CM prn Goals      Patient Stated   .  [facility]) Find Help in My Community (pt-stated)      Follow Up Date 06/01/20   - call 211 when I need some help - follow-up on any referrals for help I am given - have a back-up plan    Notes:     .  [facility]) Prevent Falls and Injury (pt-stated)      Follow Up Date 07/02/20   - always use handrails on the stairs - always wear low-heeled or flat shoes or slippers with nonskid soles - keep my cell phone with me always - use a cane or walker - wear my glasses and/or hearing aid - attend therapy       Notes:     .  [facility]) Track and Manage Symptoms (pt-stated)      Follow Up Date 07/02/20    - eat more whole grains, fruits and vegetables, lean meats and healthy fats - follow rescue plan if symptoms flare-up - know when to call the doctor - track symptoms and what helps feel better or worse - dress right for the weather, hot or cold     Notes:       Melania Kirks L. Lavina Hamman, RN, BSN, [place] Coordinator Office number [phone] [facility] number [phone] Fax number [phone]

## 2020-06-04 ENCOUNTER — Telehealth: Payer: Self-pay | Admitting: Cardiology

## 2020-06-04 ENCOUNTER — Ambulatory Visit: Payer: Medicare Other | Admitting: Nurse Practitioner

## 2020-06-04 NOTE — Telephone Encounter (Signed)
Patient's son, Patricia Garcia, is following up regarding patient being approved for assistance with purchasing Brilinta. Patricia Garcia states the patient applied for AstraZeneca, but was denied due to being on Medicaid. He states she applied for AZ&Me as well and he would like to have Dr. Johney Frame fill out the the providers portion of the application. Patricia Garcia would like to drop off the form at at the office ASAP. Is it alright if comes by today? Please return call to advise.

## 2020-06-04 NOTE — Telephone Encounter (Signed)
**Note De-Identified Gidget Quizhpi Obfuscation** I called the pt to get verbal permission to allow me to s/w her son Patricia Garcia as she does not have any DPRs on file with [place]. She did give verbal permission so I called her son Patricia Garcia.  Patricia Garcia wants to drop off his Mothers completed [place] and [place] pt asst application for her Brilinta at the office and is requesting Brilinta sample as the pt is almost out.    He is advised that he can drop her application off at Dr Holland Commons office at [facility] [address] in [place] and that we are leaving her Brilinta samples for him to pick up when he is dropping off the pts [place] and [place] pt asst application.  He was very grateful for our help.

## 2020-06-05 NOTE — Telephone Encounter (Signed)
**Note De-Identified [place] Obfuscation** The pts son left her completed [place] and [place] pt asst application at the office. I have competed the provider page of the application and have emailed all to Dr Landis Gandy (DOD) nurse so she can obtain her signature, date it, and to fax all to [facility] and [place] at fax number written on the cover letter included.  This is Dr Jacolyn Reedy pt but she is out of the office this week and the pts son is requesting we fax to [facility] and [place] ASAP so that hopefully the pt will not run out of [place].

## 2020-06-05 NOTE — Telephone Encounter (Signed)
Application has been signed and faxed to [place] and [place]. Confirmation received.

## 2020-06-07 ENCOUNTER — Telehealth: Payer: Self-pay | Admitting: Cardiology

## 2020-06-07 DIAGNOSIS — I5033 Acute on chronic diastolic (congestive) heart failure: Secondary | ICD-10-CM | POA: Diagnosis not present

## 2020-06-07 DIAGNOSIS — I2510 Atherosclerotic heart disease of native coronary artery without angina pectoris: Secondary | ICD-10-CM | POA: Diagnosis not present

## 2020-06-07 DIAGNOSIS — S72011D Unspecified intracapsular fracture of right femur, subsequent encounter for closed fracture with routine healing: Secondary | ICD-10-CM | POA: Diagnosis not present

## 2020-06-07 DIAGNOSIS — I110 Hypertensive heart disease with heart failure: Secondary | ICD-10-CM | POA: Diagnosis not present

## 2020-06-07 DIAGNOSIS — I214 Non-ST elevation (NSTEMI) myocardial infarction: Secondary | ICD-10-CM | POA: Diagnosis not present

## 2020-06-07 DIAGNOSIS — I255 Ischemic cardiomyopathy: Secondary | ICD-10-CM | POA: Diagnosis not present

## 2020-06-07 NOTE — Telephone Encounter (Signed)
Returned call to patient who states she has been feeling dizzy x 2 days and "like there is cotton stuffed into my head." Reports physical therapist did vital signs today: BP 122/60, O2 sat 98%, HR 68. Reports she feels worse when she is lying down and tries to turn her head to one side or the other. States she also experienced symptoms when reaching down to get a pot out of a cabinet. Denies dizziness with standing, moving or doing activity, or position changes. States hx of vertigo which she diagnosed and treated herself. Denies that she has taken meclizine over the past 2 days. I advised her to follow-up with PCP or to seek referral from PCP to ENT.  States she also notices her heart feels "jumpy" sometimes when she lies down at night. I asked about dehydration and she denies. States she does drink caffeine with Brilinta but only notices these symptoms when she lies down at night. I asked about which positions she is lying in when she feels the "jumping" and she reports she sleeps on her back. Denies that symptoms occur constantly. I advised her to monitor the occurrence over the next 2 weeks and call back to report. I advised her she may call sooner if the "jumping" sensation occurs more frequently or at various times through the day. She verbalized understanding and agreement with plan and thanked me for the call.

## 2020-06-07 NOTE — Telephone Encounter (Signed)
STAT if patient feels like he/she is going to faint   1) Are you dizzy now? Slightly   2) Do you feel faint or have you passed out? No   3) Do you have any other symptoms? Heart feels jumpy when resting   4) Have you checked your HR and BP (record if available)?  122/60 12:00 PM today oxygen 98 blood count 68   Patricia Garcia states this has been going on for two days.

## 2020-06-11 ENCOUNTER — Telehealth: Payer: Self-pay | Admitting: Cardiology

## 2020-06-11 NOTE — Telephone Encounter (Signed)
I spoke with patient.  She reports she has been having dizziness and palpitations (see previous phone note).  Yesterday she did not take celexa, ezetimibe and Toprol as she read some of the side effects of these medicines could be causing her symptoms.  She did not have dizziness or palpitations yesterday or so far today.  She is feeling much better.  She reports Celexa was started in the hospital. She would like to get off this medication.  I asked her to contact her PCP to discuss this. I told patient it was hard to tell which medications could be causing her symptoms as she did not take 3 of her medications yesterday.  Will forward to Melina Copa, PA to see if patient should continue to hold ezetimibe or Toprol.

## 2020-06-11 NOTE — Telephone Encounter (Signed)
New Message:     Please call, need to discuss her medication.

## 2020-06-11 NOTE — Telephone Encounter (Signed)
Called the patient back and informed her of Dr. Johney Frame and D Dunn's recommendations. She is aware to continue taking her Ezetimibe. She prefers to continue holding Metoprolol for now. She is aware to continue monitoring her BP and contact our office if her systolic is greater than 193. Patients most recent BP 122/60. Patient reports that she has also stopped her Celexa and has made a call to Dr. Carlyle Lipa office to inform him but has not received a call back. Patient aware to follow up with PCP regarding symptoms that may be related to vertigo. Patient declined a 3 day Zio at this time. She states that since she stopped the above medications 3 days ago, she has not had any issues with her heart. She believes that the Metoprolol or Celexa was causing her symptoms. Patient aware to call back with any new questions or concerns.

## 2020-06-11 NOTE — Telephone Encounter (Signed)
From reading other phone note, agree she should get in to see her PCP for evaluation if she had not already since some of her symptoms sound suspicious for vertigo. Do not suspect ezetimibe would cause this. It seems unusual that she felt great in follow-up on 11/2 but then suddenly developed dizziness - usually if people have dizziness related to beta blockers, it's when they first start, rather than when they have been on it for a while. If she wants to continue to hold the metoprolol to see how she feels (or perhaps even try cutting the dose in half), I am OK with this. Notify for any SBPs >130 while off this medicine. Would go ahead and set up a 3 day Zio to be mailed to evaluate the palpitations and arrange a f/u visit in a few weeks to see how she is doing (in addition to the 08/2020 f/u she has with Dr. Johney Frame). She needs to talk to her PCP as well about the Celexa as some of those medicines patients should not just abruptly stop. Thx.  Will also cc to Dr. Johney Frame since she got the first phone note yesterday.  Tessia Kassin PA-C

## 2020-06-11 NOTE — Telephone Encounter (Signed)
Agree with the advice given by both Enis Slipper and Dayna Dunn-PA-C. I do not suspect this to have to do with her zetia. She can hold her beta blocker for now or try back at half dose as I really do not think this is the cause of her symptoms. Agree with PCP follow-up.

## 2020-06-12 DIAGNOSIS — S72031D Displaced midcervical fracture of right femur, subsequent encounter for closed fracture with routine healing: Secondary | ICD-10-CM | POA: Diagnosis not present

## 2020-06-13 DIAGNOSIS — K219 Gastro-esophageal reflux disease without esophagitis: Secondary | ICD-10-CM | POA: Diagnosis not present

## 2020-06-13 DIAGNOSIS — E785 Hyperlipidemia, unspecified: Secondary | ICD-10-CM | POA: Diagnosis not present

## 2020-06-13 DIAGNOSIS — I214 Non-ST elevation (NSTEMI) myocardial infarction: Secondary | ICD-10-CM | POA: Diagnosis not present

## 2020-06-13 DIAGNOSIS — M479 Spondylosis, unspecified: Secondary | ICD-10-CM | POA: Diagnosis not present

## 2020-06-13 DIAGNOSIS — I129 Hypertensive chronic kidney disease with stage 1 through stage 4 chronic kidney disease, or unspecified chronic kidney disease: Secondary | ICD-10-CM | POA: Diagnosis not present

## 2020-06-13 DIAGNOSIS — I2510 Atherosclerotic heart disease of native coronary artery without angina pectoris: Secondary | ICD-10-CM | POA: Diagnosis not present

## 2020-06-13 DIAGNOSIS — I110 Hypertensive heart disease with heart failure: Secondary | ICD-10-CM | POA: Diagnosis not present

## 2020-06-13 DIAGNOSIS — I10 Essential (primary) hypertension: Secondary | ICD-10-CM | POA: Diagnosis not present

## 2020-06-13 DIAGNOSIS — N1831 Chronic kidney disease, stage 3a: Secondary | ICD-10-CM | POA: Diagnosis not present

## 2020-06-13 DIAGNOSIS — I255 Ischemic cardiomyopathy: Secondary | ICD-10-CM | POA: Diagnosis not present

## 2020-06-13 DIAGNOSIS — M160 Bilateral primary osteoarthritis of hip: Secondary | ICD-10-CM | POA: Diagnosis not present

## 2020-06-13 DIAGNOSIS — I5033 Acute on chronic diastolic (congestive) heart failure: Secondary | ICD-10-CM | POA: Diagnosis not present

## 2020-06-18 ENCOUNTER — Ambulatory Visit (INDEPENDENT_AMBULATORY_CARE_PROVIDER_SITE_OTHER): Payer: Medicare Other | Admitting: Otolaryngology

## 2020-06-20 ENCOUNTER — Telehealth (HOSPITAL_COMMUNITY): Payer: Self-pay

## 2020-06-20 NOTE — Telephone Encounter (Signed)
Cardiac Rehab Note:  Successful telephone encounter to [name], son of patient Patricia Garcia. [name] to follow up on home health PT prior to patient enrolling in cardiac rehab. Per son, patient has not been released from ortho to participate in monderate exercise secondary to recent hip replacement. Son states patient has follow up ortho appt on 12/22. Will continue to follow and schedule patient for cardiac rehab orientation once cleared by ortho.  [name] E. Rollene Rotunda RN, BSN [place]. [facility]  Cardiac and Pulmonary Rehabilitation Phone: [phone] Fax: [phone]

## 2020-06-22 DIAGNOSIS — R309 Painful micturition, unspecified: Secondary | ICD-10-CM | POA: Diagnosis not present

## 2020-07-02 ENCOUNTER — Encounter: Payer: Self-pay | Admitting: *Deleted

## 2020-07-02 ENCOUNTER — Other Ambulatory Visit: Payer: Self-pay | Admitting: *Deleted

## 2020-07-02 ENCOUNTER — Other Ambulatory Visit: Payer: Self-pay

## 2020-07-02 NOTE — Patient Outreach (Signed)
[facility]) Care Management  07/02/2020  Patricia Garcia 1932-11-24 [id number]  [facility] Telephone outreachpost hospitalreferral  Referral Date:05/11/20 Referral Source:Patricia Garcia, [facility] RN CM hospital liaison Referral Reason:Please assign to [place] Coordinator for complex care PCP does the [facility] follow up and disease management follow up calls and assess for further needs  Insurance:NextGen Medicareandblue cross and blue shieldsupplement  Assessment/follow up Patricia Garcia reports she is now back home living alone with visits from her son Patricia Garcia and significant other Patricia Garcia Mr Patricia Garcia visited today and assisted her with her christmas decorations  Depression She reports she has had moments of "crying spells" and laughing recently as she thinks about the loss of her  Husband during this time of the year and the loss of being active as she reports her family have informed her she is not to drive, or go to her basement (where she fell prior to admission)  She was given time to ventilate her feelings She was assessed for depression  Depression screen [facility] 2/9 07/02/2020  Decreased Interest 1  Down, Depressed, Hopeless 1  PHQ - 2 Score 2  Altered sleeping 1  Tired, decreased energy 1  Change in appetite 0  Feeling bad or failure about yourself  0  Moving slowly or fidgety/restless 0  Suicidal thoughts 0  PHQ-9 Score 4  Difficult Garcia work/chores Somewhat difficult    She was allowed time to ventilate her feeling Now 118 lbs Not eating a lot  She has been dehydrated stool abdomen pain Had tea and urinated during the night She and [facility] RN CM discussed intake of fluids 60-64 ounces  Fell backing down stairs  Discussed ICU or hospital psychosis Hs not drive no go up and down stairs sio cmae over to help her decorate her christmas tree  Falls  Patricia Garcia reports she has fallen once in her bathroom since she has been back at her home [place] when she  reached out for her bathroom door It swung inward She sat down had left side pain and bumped her head on the wall She reports having dizzy spells  Denies any headaches, seeing flashes of lights  Admits to "somewhat" visual changes/ She believes visual issues are related to medical diagnosis of dry eyes and Macular degeneration of the left eye She confirms trouble with reading small print on medicine bottles See eye MD, [place] on 08/02/20 for a routine visit  She had fallen prior to her admission She fell backwards off stairs in the basement  Home safety she wears an alert necklace now and before her fall prior to admission but did not have it on her   Transportation/pending cardiac rehab Gave scat/ access [place] RN CM will follow up with patient within the next 30 business days Pt encouraged to return a call to [place] CM prn   Patricia Garcia L. Patricia Hamman, RN, BSN, [place] Coordinator Office number [phone] [facility] number [phone] Fax number [phone]

## 2020-07-03 ENCOUNTER — Encounter (INDEPENDENT_AMBULATORY_CARE_PROVIDER_SITE_OTHER): Payer: Self-pay | Admitting: Otolaryngology

## 2020-07-03 ENCOUNTER — Ambulatory Visit (INDEPENDENT_AMBULATORY_CARE_PROVIDER_SITE_OTHER): Payer: Medicare Other | Admitting: Otolaryngology

## 2020-07-03 VITALS — Temp 96.1°F

## 2020-07-03 DIAGNOSIS — R42 Dizziness and giddiness: Secondary | ICD-10-CM | POA: Diagnosis not present

## 2020-07-03 DIAGNOSIS — H6123 Impacted cerumen, bilateral: Secondary | ICD-10-CM | POA: Diagnosis not present

## 2020-07-03 DIAGNOSIS — I2510 Atherosclerotic heart disease of native coronary artery without angina pectoris: Secondary | ICD-10-CM | POA: Diagnosis not present

## 2020-07-03 DIAGNOSIS — H8110 Benign paroxysmal vertigo, unspecified ear: Secondary | ICD-10-CM

## 2020-07-03 NOTE — Progress Notes (Signed)
HPI: Patricia Garcia is a 86 y.o. female who presents for evaluation of wax buildup in her ears as well as a long history of dizziness that started when she had bad vertigo in May for about a week.  She has been followed by Dr. Felipa Eth and he referred her to vestibular rehab but she did not tolerate this well and did not tolerate the Epley maneuver performed by them.  She describes mostly just imbalance and dizziness but occasionally has some vertigo when she lays back and turns her head severely to the right or left.  She also wears bilateral hearing aids and has slight decreased hearing..  Past Medical History:  Diagnosis Date  . Anal or rectal pain   . Anemia   . Arthritis   . CAD (coronary artery disease)    a. NSTEMI 04/2020 s/p DES to [facility] and D1.  . Chronic diastolic CHF (congestive heart failure) ([place])   . Depression   . Diverticulosis of colon (without mention of hemorrhage)   . Hypertension   . Hypoalbuminemia   . Internal hemorrhoids without mention of complication   . Ischemic heart disease   . LBBB (left bundle branch block)   . Osteopenia   . Renal insufficiency   . S/P right hip fracture   . Slow transit constipation    Past Surgical History:  Procedure Laterality Date  . ABDOMINAL HYSTERECTOMY    . CATARACT EXTRACTION, BILATERAL    . CORONARY STENT INTERVENTION N/A 05/08/2020   Procedure: CORONARY STENT INTERVENTION;  Surgeon: Martinique, Peter M, MD;  Location: [facility];  Service: Cardiovascular;  Laterality: N/A;  . LEFT HEART CATH AND CORONARY ANGIOGRAPHY N/A 05/08/2020   Procedure: LEFT HEART CATH AND CORONARY ANGIOGRAPHY;  Surgeon: Martinique, Peter M, MD;  Location: [facility];  Service: Cardiovascular;  Laterality: N/A;  . MASS EXCISION  06/10/2011   Procedure: EXCISION MASS;  Surgeon: Wynonia Sours, MD;  Location: [place];  Service: Orthopedics;  Laterality: Right;  excision cyst right thumb IP, debridement IP right thumb  .  TONSILLECTOMY    . TOTAL HIP ARTHROPLASTY Right 05/02/2020   Procedure: TOTAL HIP ARTHROPLASTY ANTERIOR APPROACH;  Surgeon: Rod Can, MD;  Location: [facility];  Service: Orthopedics;  Laterality: Right;   Social History   Socioeconomic History  . Marital status: Widowed    Spouse name: Not on file  . Number of children: 2  . Years of education: Not on file  . Highest education level: Not on file  Occupational History  . Occupation: Retired    Fish farm manager: RETIRED  Tobacco Use  . Smoking status: Former Smoker    Quit date: 06/05/1984    Years since quitting: 36.1  . Smokeless tobacco: Never Used  Substance and Sexual Activity  . Alcohol use: No  . Drug use: No  . Sexual activity: Not on file  Other Topics Concern  . Not on file  Social History Narrative   Son mark local    Daughter out of town in [place] -Pensions consultant   Social Determinants of Health   Financial Resource Strain: Not on file  Food Insecurity: No Food Insecurity  . Worried About Charity fundraiser in the Last Year: Never true  . Ran Out of Food in the Last Year: Never true  Transportation Needs: No Transportation Needs  . Lack of Transportation (Medical): No  . Lack of Transportation (Non-Medical): No  Physical Activity: Not on file  Stress: Not on file  Social Connections: Moderately Integrated  . Frequency of Communication with Friends and Family: More than three times a week  . Frequency of Social Gatherings with Friends and Family: More than three times a week  . Attends Religious Services: 1 to 4 times per year  . Active Member of Clubs or Organizations: Yes  . Attends Archivist Meetings: 1 to 4 times per year  . Marital Status: Widowed   Family History  Problem Relation Age of Onset  . Colon polyps Mother   . Cancer Father        bladder  . Heart disease Father    Allergies  Allergen Reactions  . Penicillins Other (See Comments)    unknown   . Prednisone Other (See Comments)    unknown  . Statins Other (See Comments)    shaking  . Sulfonamide Derivatives Other (See Comments)    unknown  . Tramadol Other (See Comments)    unknown   Prior to Admission medications   Medication Sig Start Date End Date Taking? Authorizing Provider  acetaminophen (TYLENOL) 500 MG tablet Take 1 tablet (500 mg total) by mouth every 8 (eight) hours as needed for mild pain, moderate pain, fever or headache. 05/10/20 07/09/20  Mercy Riding, MD  ALPRAZolam (XANAX) 0.25 MG tablet Take 0.025 mg by mouth at bedtime as needed for anxiety.     [provider]  aspirin 81 MG chewable tablet aspirin 81 mg chewable tablet    [provider]  aspirin EC 81 MG tablet Take 1 tablet (81 mg total) by mouth daily. Swallow whole. 05/22/20   Dunn, Nedra Hai, PA-C  azithromycin (ZITHROMAX) 250 MG tablet azithromycin 250 mg tablet    [provider]  Calcium Carbonate-Vit D-Min (CALCIUM 1200 PO) Take 1,200 mg by mouth daily.    [provider]  cholecalciferol (VITAMIN D) 1000 UNITS tablet Take 1,000 Units by mouth daily.      [provider]  citalopram (CELEXA) 20 MG tablet Take 20 mg by mouth daily.      [provider]  cloNIDine (CATAPRES) 0.1 MG tablet Take 0.1 mg by mouth daily.     [provider]  docusate sodium (COLACE) 100 MG capsule Take 1 capsule (100 mg total) by mouth 2 (two) times daily. 05/04/20   Oswald Hillock, MD  ezetimibe (ZETIA) 10 MG tablet Take 1 tablet (10 mg total) by mouth daily. 05/11/20   Mercy Riding, MD  famotidine (PEPCID) 40 MG tablet Take 40 mg by mouth daily.    [provider]  ferrous sulfate 325 (65 FE) MG tablet Take 325 mg by mouth See admin instructions. 3 times a week    [provider]  HYDROcodone-acetaminophen (NORCO/VICODIN) 5-325 MG tablet hydrocodone 5 mg-acetaminophen 325 mg tablet    [provider]  latanoprost (XALATAN) 0.005 %  ophthalmic solution Place 1 drop into both eyes at bedtime.    [provider]  Meclizine HCl 25 MG CHEW meclizine 25 mg chewable tablet    [provider]  metoprolol succinate (TOPROL-XL) 25 MG 24 hr tablet Take 1 tablet (25 mg total) by mouth daily. 05/11/20   Mercy Riding, MD  multivitamin-iron-minerals-folic acid (CENTRUM) chewable tablet Chew 1 tablet by mouth daily.      [provider]  Naphazoline-Pheniramine (OPCON-A OP) Place 1 drop into both eyes daily as needed (for eye allergy).  [provider]  OLIVE LEAF EXTRACT PO Take 1 tablet by mouth daily.    [provider]  Omega-3 1000 MG CAPS Take 1,000 mg by mouth daily.    [provider]  omeprazole (PRILOSEC) 20 MG capsule Take 1 capsule (20 mg total) by mouth daily. 03/24/12   Sable Feil, MD  senna (SENOKOT) 8.6 MG TABS tablet Take 1 tablet (8.6 mg total) by mouth daily as needed for mild constipation. 05/04/20   Oswald Hillock, MD  sodium chloride (MURO 128) 2 % ophthalmic solution Place 1 drop into both eyes 2 (two) times daily as needed for eye irritation.    [provider]  ticagrelor (BRILINTA) 90 MG TABS tablet Take 1 tablet (90 mg total) by mouth 2 (two) times daily. 05/10/20   Mercy Riding, MD     Positive ROS: Otherwise negative  All other systems have been reviewed and were otherwise negative with the exception of those mentioned in the HPI and as above.  Physical Exam: Constitutional: Alert, well-appearing, no acute distress Ears: External ears without lesions or tenderness. Ear canals have crusting on both TMs that was removed in the office with improved hearing.  On Dix-Hallpike testing she did have some vertigo with her head turned back to the right but she did not want to have the Epley maneuver performed.  I gave her information on the Epley maneuver in BPPV.Marland Kitchen Nasal: External nose without lesions. Clear nasal passages Oral: Oropharynx  clear. Neck: No palpable adenopathy or masses Respiratory: Breathing comfortably  Skin: No facial/neck lesions or rash noted.  Cerumen impaction removal  Date/Time: 07/03/2020 2:32 PM Performed by: Rozetta Nunnery, MD Authorized by: Rozetta Nunnery, MD   Consent:    Consent obtained:  Verbal   Consent given by:  Patient   Risks discussed:  Pain and bleeding Procedure details:    Location:  L ear and R ear   Procedure type: suction and forceps   Post-procedure details:    Inspection:  TM intact and canal normal   Hearing quality:  Improved   Patient tolerance of procedure:  Tolerated well, no immediate complications Comments:     She has some crusting on both TMs that was removed.  TMs are otherwise clear with good mobility on pneumatic otoscopy.    Assessment: Crusting on both TMs that was removed in the office. Dizziness with history of BPPV.  Plan: I gave her information on BPPV.  She is not interested in going to vestibular rehab again.  If vertigo problems persist discussed with her concerning making a separate appointment to review the Epley maneuver with her in the office if needed. She will follow-up as needed.  Radene Journey, MD

## 2020-07-09 DIAGNOSIS — I255 Ischemic cardiomyopathy: Secondary | ICD-10-CM | POA: Diagnosis not present

## 2020-07-09 DIAGNOSIS — M160 Bilateral primary osteoarthritis of hip: Secondary | ICD-10-CM | POA: Diagnosis not present

## 2020-07-09 DIAGNOSIS — M479 Spondylosis, unspecified: Secondary | ICD-10-CM | POA: Diagnosis not present

## 2020-07-09 DIAGNOSIS — K219 Gastro-esophageal reflux disease without esophagitis: Secondary | ICD-10-CM | POA: Diagnosis not present

## 2020-07-09 DIAGNOSIS — N1831 Chronic kidney disease, stage 3a: Secondary | ICD-10-CM | POA: Diagnosis not present

## 2020-07-09 DIAGNOSIS — I5033 Acute on chronic diastolic (congestive) heart failure: Secondary | ICD-10-CM | POA: Diagnosis not present

## 2020-07-09 DIAGNOSIS — E785 Hyperlipidemia, unspecified: Secondary | ICD-10-CM | POA: Diagnosis not present

## 2020-07-09 DIAGNOSIS — I110 Hypertensive heart disease with heart failure: Secondary | ICD-10-CM | POA: Diagnosis not present

## 2020-07-09 DIAGNOSIS — I214 Non-ST elevation (NSTEMI) myocardial infarction: Secondary | ICD-10-CM | POA: Diagnosis not present

## 2020-07-09 DIAGNOSIS — I2510 Atherosclerotic heart disease of native coronary artery without angina pectoris: Secondary | ICD-10-CM | POA: Diagnosis not present

## 2020-07-09 DIAGNOSIS — I129 Hypertensive chronic kidney disease with stage 1 through stage 4 chronic kidney disease, or unspecified chronic kidney disease: Secondary | ICD-10-CM | POA: Diagnosis not present

## 2020-07-16 ENCOUNTER — Other Ambulatory Visit: Payer: Self-pay

## 2020-07-16 ENCOUNTER — Other Ambulatory Visit: Payer: Medicare Other | Admitting: *Deleted

## 2020-07-16 DIAGNOSIS — N179 Acute kidney failure, unspecified: Secondary | ICD-10-CM | POA: Diagnosis not present

## 2020-07-16 DIAGNOSIS — I214 Non-ST elevation (NSTEMI) myocardial infarction: Secondary | ICD-10-CM | POA: Diagnosis not present

## 2020-07-16 DIAGNOSIS — I2510 Atherosclerotic heart disease of native coronary artery without angina pectoris: Secondary | ICD-10-CM

## 2020-07-16 DIAGNOSIS — D649 Anemia, unspecified: Secondary | ICD-10-CM

## 2020-07-16 DIAGNOSIS — I5032 Chronic diastolic (congestive) heart failure: Secondary | ICD-10-CM

## 2020-07-16 LAB — HEPATIC FUNCTION PANEL
ALT: 14 IU/L (ref 0–32)
AST: 25 IU/L (ref 0–40)
Albumin: 4.3 g/dL (ref 3.6–4.6)
Alkaline Phosphatase: 82 IU/L (ref 44–121)
Bilirubin Total: 0.3 mg/dL (ref 0.0–1.2)
Bilirubin, Direct: <0.1 mg/dL (ref 0.00–0.40)
Total Protein: 7.3 g/dL (ref 6.0–8.5)

## 2020-07-16 LAB — LIPID PANEL
Chol/HDL Ratio: 5.2 ratio — ABNORMAL HIGH (ref 0.0–4.4)
Cholesterol, Total: 257 mg/dL — ABNORMAL HIGH (ref 100–199)
HDL: 49 mg/dL (ref >39)
LDL Chol Calc (NIH): 163 mg/dL — ABNORMAL HIGH (ref 0–99)
Triglycerides: 240 mg/dL — ABNORMAL HIGH (ref 0–149)
VLDL Cholesterol Cal: 45 mg/dL — ABNORMAL HIGH (ref 5–40)

## 2020-07-17 ENCOUNTER — Telehealth: Payer: Self-pay | Admitting: *Deleted

## 2020-07-17 DIAGNOSIS — I2510 Atherosclerotic heart disease of native coronary artery without angina pectoris: Secondary | ICD-10-CM

## 2020-07-17 NOTE — Telephone Encounter (Signed)
-----   Message from Laurann [place], [place] sent at 07/16/2020  4:57 PM EST ----- Please let pt know lipid profile shows cholesterol is pretty uncontrolled, worse than prior values. Is she still taking Zetia? Let's refer her to the lipid clinic for further management given history of statin intolerance.

## 2020-07-18 ENCOUNTER — Telehealth: Payer: Self-pay | Admitting: Cardiology

## 2020-07-18 NOTE — Telephone Encounter (Signed)
Malena Peer D, RPH-CPP 3 minutes ago (3:25 PM)      The recommendation is to take with caffeine, not specifically coke. It would be more ideal for her to drink some black coffee or unsweet tea. She should still keep her lipid apt as this may have had a small effect, but overall cholesterol needs to be addressed     Patient made aware of the above recommendations and verbalized understanding.

## 2020-07-18 NOTE — Telephone Encounter (Signed)
Pt c/o medication issue:  1. Name of Medication: ticagrelor (BRILINTA) 90 MG TABS tablet  2. How are you currently taking this medication (dosage and times per day)? 1 tablet twice a day  3. Are you having a reaction (difficulty breathing--STAT)? no  4. What is your medication issue? Called patient to schedule lipid clinic referral with PharmD. Patient states she had been getting SOB when she took the brilints in the hospital. She states she would wake up and could not breathe. She states she was told by a nurse in the hospital to take the medication with coke to help it. She states she was suspicious and looked it up on the computer. She states it said this can make your cholesterol higher. She would like to know if this could have made her cholesterol high and whether she still needs to schedule with the pharmacist. Please advise.

## 2020-07-18 NOTE — Telephone Encounter (Signed)
The recommendation is to take with caffeine, not specifically coke. It would be more ideal for her to drink some black coffee or unsweet tea. She should still keep her lipid apt as this may have had a small effect, but overall cholesterol needs to be addressed

## 2020-07-23 ENCOUNTER — Other Ambulatory Visit: Payer: Medicare Other

## 2020-07-27 DIAGNOSIS — M160 Bilateral primary osteoarthritis of hip: Secondary | ICD-10-CM | POA: Diagnosis not present

## 2020-07-27 DIAGNOSIS — I129 Hypertensive chronic kidney disease with stage 1 through stage 4 chronic kidney disease, or unspecified chronic kidney disease: Secondary | ICD-10-CM | POA: Diagnosis not present

## 2020-07-27 DIAGNOSIS — K219 Gastro-esophageal reflux disease without esophagitis: Secondary | ICD-10-CM | POA: Diagnosis not present

## 2020-07-27 DIAGNOSIS — I110 Hypertensive heart disease with heart failure: Secondary | ICD-10-CM | POA: Diagnosis not present

## 2020-07-27 DIAGNOSIS — E785 Hyperlipidemia, unspecified: Secondary | ICD-10-CM | POA: Diagnosis not present

## 2020-07-27 DIAGNOSIS — I5033 Acute on chronic diastolic (congestive) heart failure: Secondary | ICD-10-CM | POA: Diagnosis not present

## 2020-07-27 DIAGNOSIS — I255 Ischemic cardiomyopathy: Secondary | ICD-10-CM | POA: Diagnosis not present

## 2020-07-27 DIAGNOSIS — I2510 Atherosclerotic heart disease of native coronary artery without angina pectoris: Secondary | ICD-10-CM | POA: Diagnosis not present

## 2020-07-27 DIAGNOSIS — M479 Spondylosis, unspecified: Secondary | ICD-10-CM | POA: Diagnosis not present

## 2020-07-27 DIAGNOSIS — N1831 Chronic kidney disease, stage 3a: Secondary | ICD-10-CM | POA: Diagnosis not present

## 2020-07-27 DIAGNOSIS — I214 Non-ST elevation (NSTEMI) myocardial infarction: Secondary | ICD-10-CM | POA: Diagnosis not present

## 2020-08-01 ENCOUNTER — Telehealth (INDEPENDENT_AMBULATORY_CARE_PROVIDER_SITE_OTHER): Payer: Medicare Other | Admitting: Physician Assistant

## 2020-08-01 ENCOUNTER — Encounter: Payer: Self-pay | Admitting: Physician Assistant

## 2020-08-01 ENCOUNTER — Other Ambulatory Visit: Payer: Self-pay

## 2020-08-01 ENCOUNTER — Telehealth: Payer: Self-pay | Admitting: *Deleted

## 2020-08-01 VITALS — Ht 63.0 in | Wt 121.0 lb

## 2020-08-01 DIAGNOSIS — I10 Essential (primary) hypertension: Secondary | ICD-10-CM

## 2020-08-01 DIAGNOSIS — E785 Hyperlipidemia, unspecified: Secondary | ICD-10-CM

## 2020-08-01 DIAGNOSIS — I5032 Chronic diastolic (congestive) heart failure: Secondary | ICD-10-CM

## 2020-08-01 DIAGNOSIS — I2510 Atherosclerotic heart disease of native coronary artery without angina pectoris: Secondary | ICD-10-CM

## 2020-08-01 NOTE — Patient Instructions (Signed)
Medication Instructions:  Your physician recommends that you continue on your current medications as directed. Please refer to the Current Medication list given to you today.  *If you need a refill on your cardiac medications before your next appointment, please call your pharmacy*   Lab Work: None ordered  If you have labs (blood work) drawn today and your tests are completely normal, you will receive your results only by: Marland Kitchen MyChart Message (if you have MyChart) OR . A paper copy in the mail If you have any lab test that is abnormal or we need to change your treatment, we will call you to review the results.   Testing/Procedures: None ordered   Follow-Up: At [facility], you and your health needs are our priority.  As part of our continuing mission to provide you with exceptional heart care, we have created designated Provider Care Teams.  These Care Teams include your primary Cardiologist (physician) and Advanced Practice Providers (APPs -  Physician Assistants and Nurse Practitioners) who all work together to provide you with the care you need, when you need it.  We recommend signing up for the patient portal called "MyChart".  Sign up information is provided on this After Visit Summary.  MyChart is used to connect with patients for Virtual Visits (Telemedicine).  Patients are able to view lab/test results, encounter notes, upcoming appointments, etc.  Non-urgent messages can be sent to your provider as well.   To learn more about what you can do with MyChart, go to [email or web address].    Your next appointment:   08/24/2020 as scheduled   The format for your next appointment:   In Person  Provider:   Gwyndolyn Kaufman, MD   Other Instructions

## 2020-08-01 NOTE — Progress Notes (Signed)
Virtual Visit via Telephone Note   This visit type was conducted due to national recommendations for restrictions regarding the COVID-19 Pandemic (e.g. social distancing) in an effort to limit this patient's exposure and mitigate transmission in our community.  Due to her co-morbid illnesses, this patient is at least at moderate risk for complications without adequate follow up.  This format is felt to be most appropriate for this patient at this time.  The patient did not have access to video technology/had technical difficulties with video requiring transitioning to audio format only (telephone).  All issues noted in this document were discussed and addressed.  No physical exam could be performed with this format.  Please refer to the patient's chart for her  consent to telehealth for [facility].    Date:  08/01/2020   ID:  Patricia Garcia, DOB Aug 11, 1932, MRN [id number] The patient was identified using 2 identifiers.  Patient Location: Home Provider Location: Home Office  PCP:  Lajean Manes, MD  Cardiologist:  Freada Bergeron, MD  Electrophysiologist:  None   Evaluation Performed:  Follow-Up Visit  Chief Complaint:  2 months follow up   History of Present Illness:    Patricia Garcia is a 87 y.o. female with with history of CAD, chronic diastolic heart failure, hypertension, left bundle branch block, arthritis, depression and hyperlipidemia seen for follow-up.  Stress test February 2021 was low risk.  Done by PCP.  She had a mechanical fall requiring hip fracture surgery May 02, 2020.  We admitted 05/06/2020 with nausea, vomiting and hypoxia.  She was found to have non-STEMI and acute on chronic diastolic heart failure.  She was treated with Lasix.  Echo showed LV function of 50 to 53%, grade 2 diastolic dysfunction.  Mild mitral regurgitation.  Cardiac cath showed severe three-vessel disease status post PCI of mid RCA and first diagonal with DES.  She was also  transfused for anemia.  Last seen by APP on November 2021.  Advised to try coffee instead of Coke for Brilinta related shortness of breath.  She was referred to lipid clinic for elevated cholesterol.  Seen virtually for follow-up.  Patient reports not taking metoprolol and Zetia.  She has been compliant with aspirin and Brilinta.  She uses coke with Brilinta to avoid shortness of breath.  Without Coke patient reports severe shortness of breath.  She does not want to try coffee.  Patient dealing with dizziness for greater than 1 year.  No definite evaluation by PCP before.  She did not notice much difference in dizziness while on metoprolol.  She does not want to restart currently.  She denies orthopnea, PND, syncope, lower extremity edema or melena.  She tries to read about each medication in detail "on computer".  The patient does not have symptoms concerning for COVID-19 infection (fever, chills, cough, or new shortness of breath).    Past Medical History:  Diagnosis Date  . Anal or rectal pain   . Anemia   . Arthritis   . CAD (coronary artery disease)    a. NSTEMI 04/2020 s/p DES to [facility] and D1.  . Chronic diastolic CHF (congestive heart failure) ([place])   . Depression   . Diverticulosis of colon (without mention of hemorrhage)   . Hypertension   . Hypoalbuminemia   . Internal hemorrhoids without mention of complication   . Ischemic heart disease   . LBBB (left bundle branch block)   . Osteopenia   . Renal insufficiency   .  S/P right hip fracture   . Slow transit constipation    Past Surgical History:  Procedure Laterality Date  . ABDOMINAL HYSTERECTOMY    . CATARACT EXTRACTION, BILATERAL    . CORONARY STENT INTERVENTION N/A 05/08/2020   Procedure: CORONARY STENT INTERVENTION;  Surgeon: Martinique, Peter M, MD;  Location: [place] [facility];  Service: Cardiovascular;  Laterality: N/A;  . LEFT HEART CATH AND CORONARY ANGIOGRAPHY N/A 05/08/2020   Procedure: LEFT HEART CATH AND CORONARY  ANGIOGRAPHY;  Surgeon: Martinique, Peter M, MD;  Location: [place] [facility];  Service: Cardiovascular;  Laterality: N/A;  . MASS EXCISION  06/10/2011   Procedure: EXCISION MASS;  Surgeon: Wynonia Sours, MD;  Location: [place];  Service: Orthopedics;  Laterality: Right;  excision cyst right thumb IP, debridement IP right thumb  . TONSILLECTOMY    . TOTAL HIP ARTHROPLASTY Right 05/02/2020   Procedure: TOTAL HIP ARTHROPLASTY ANTERIOR APPROACH;  Surgeon: Rod Can, MD;  Location: [facility];  Service: Orthopedics;  Laterality: Right;     Current Meds  Medication Sig  . aspirin 81 MG chewable tablet aspirin 81 mg chewable tablet  . Calcium Carbonate-Vit D-Min (CALCIUM 1200 PO) Take 1,200 mg by mouth daily.  . cholecalciferol (VITAMIN D) 1000 UNITS tablet Take 1,000 Units by mouth daily.  . cloNIDine (CATAPRES) 0.1 MG tablet Take 0.1 mg by mouth daily.   Marland Kitchen docusate sodium (COLACE) 100 MG capsule Take 1 capsule (100 mg total) by mouth 2 (two) times daily.  . ferrous sulfate 325 (65 FE) MG tablet Take 325 mg by mouth See admin instructions. 3 times a week  . latanoprost (XALATAN) 0.005 % ophthalmic solution Place 1 drop into both eyes at bedtime.  . multivitamin-iron-minerals-folic acid (CENTRUM) chewable tablet Chew 1 tablet by mouth daily.  . Naphazoline-Pheniramine (OPCON-A OP) Place 1 drop into both eyes daily as needed (for eye allergy).  . sodium chloride (MURO 128) 2 % ophthalmic solution Place 1 drop into both eyes 2 (two) times daily as needed for eye irritation.  . ticagrelor (BRILINTA) 90 MG TABS tablet Take 1 tablet (90 mg total) by mouth 2 (two) times daily.  . [DISCONTINUED] ALPRAZolam (XANAX) 0.25 MG tablet Take 0.025 mg by mouth at bedtime as needed for anxiety.  . [DISCONTINUED] aspirin EC 81 MG tablet Take 1 tablet (81 mg total) by mouth daily. Swallow whole.  . [DISCONTINUED] citalopram (CELEXA) 20 MG tablet Take 20 mg by mouth daily.  . [DISCONTINUED] ezetimibe  (ZETIA) 10 MG tablet Take 1 tablet (10 mg total) by mouth daily.  . [DISCONTINUED] famotidine (PEPCID) 40 MG tablet Take 40 mg by mouth daily.  . [DISCONTINUED] metoprolol succinate (TOPROL-XL) 25 MG 24 hr tablet Take 1 tablet (25 mg total) by mouth daily.  . [DISCONTINUED] OLIVE LEAF EXTRACT PO Take 1 tablet by mouth daily.  . [DISCONTINUED] senna (SENOKOT) 8.6 MG TABS tablet Take 1 tablet (8.6 mg total) by mouth daily as needed for mild constipation.     Allergies:   Penicillins, Prednisone, Statins, Sulfonamide derivatives, and Tramadol   Social History   Tobacco Use  . Smoking status: Former Smoker    Quit date: 06/05/1984    Years since quitting: 36.1  . Smokeless tobacco: Never Used  Substance Use Topics  . Alcohol use: No  . Drug use: No     Family Hx: The patient's family history includes Cancer in her father; Colon polyps in her mother; Heart disease in her father.  ROS:  Please see the history of present illness.   All other systems reviewed and are negative.   Prior CV studies:   The following studies were reviewed today:  Echo 04/2020 1. Hypokinesis of the basal inferior and inferolateral wall with overall  preserved LV function.  2. Left ventricular ejection fraction, by estimation, is 50 to 55%. The  left ventricle has low normal function. The left ventricle demonstrates  regional wall motion abnormalities (see scoring diagram/findings for  description). Left ventricular diastolic  parameters are consistent with Grade II diastolic dysfunction  (pseudonormalization). Elevated left atrial pressure.  3. Right ventricular systolic function is normal. The right ventricular  size is normal. There is mildly elevated pulmonary artery systolic  pressure.  4. Moderate pleural effusion in the left lateral region.  5. The mitral valve is normal in structure. Mild mitral valve  regurgitation. No evidence of mitral stenosis.  6. The aortic valve is tricuspid.  Aortic valve regurgitation is trivial.  No aortic stenosis is present.  7. The inferior vena cava is normal in size with greater than 50%  respiratory variability, suggesting right atrial pressure of 3 mmHg.    CORONARY STENT INTERVENTION  LEFT HEART CATH AND CORONARY ANGIOGRAPHY    Conclusion    1st Diag lesion is 85% stenosed.  Ramus lesion is 30% stenosed.  Prox Cx to Mid Cx lesion is 90% stenosed.  Prox RCA to Mid RCA lesion is 95% stenosed.  Post intervention, there is a 0% residual stenosis.  A drug-eluting stent was successfully placed using a STENT RESOLUTE ONYX 3.0X15.  Post intervention, there is a 0% residual stenosis.  A drug-eluting stent was successfully placed using a [place] 2.25X15.  LV end diastolic pressure is mildly elevated.   1. 3 vessel obstructive CAD    - 95% mid RCA- this appeared to be the culprit lesion    - 85% proxima first diagonal    - 90% very small LCx in the AV groove 2. Mildly elevated LVEDP 3. Successful PCI of the mid RCA with DES x 1 4. Successful PCI of the first diagonal with DES x 1  Plan: DAPT for one year. Will transition lasix to po. Further plans per primary team.   Labs/Other Tests and Data Reviewed:    EKG:  No ECG reviewed.  Recent Labs: 05/06/2020: B Natriuretic Peptide 1,221.6 05/10/2020: BUN 16; Creatinine, Ser 1.28; Hemoglobin 8.9; Magnesium 2.1; Platelets 361; Potassium 3.3; Sodium 136 07/16/2020: ALT 14   Recent Lipid Panel Lab Results  Component Value Date/Time   CHOL 257 (H) 07/16/2020 09:04 AM   TRIG 240 (H) 07/16/2020 09:04 AM   HDL 49 07/16/2020 09:04 AM   CHOLHDL 5.2 (H) 07/16/2020 09:04 AM   CHOLHDL 4.4 05/08/2020 04:57 AM   LDLCALC 163 (H) 07/16/2020 09:04 AM    Wt Readings from Last 3 Encounters:  08/01/20 121 lb (54.9 kg)  06/01/20 121 lb (54.9 kg)  05/22/20 126 lb 12.8 oz (57.5 kg)     Risk Assessment/Calculations:      Objective:    Vital Signs:  Ht 5\' 3"  (1.6 m)    Wt 121 lb (54.9 kg)   BMI 21.43 kg/m    VITAL SIGNS:  reviewed GEN:  no acute distress PSYCH:  normal affect  ASSESSMENT & PLAN:    1. CAD -No anginal symptoms.  Patient does not want to restart metoprolol and statin/Zetia currently. -Continue dual antiplatelet therapy with aspirin 81mg  qd and Brilinta.  She uses coke  with Brilinta to avoid shortness of breath.  Discussed changing Brilinta to Plavix but will readdress during next office visit.  Hard to do conversation over the phone.  Patient has a lot of resistant to changing medication.   2. Hyperlipidemia -Does not want to try Zetia for unknown reason. -She has done some research and will try omega-3 fatty acid.  -It is best that she will do medication discussion in person.  3. chronic diastolic heart failure -No heart failure symptoms.       COVID-19 Education: The signs and symptoms of COVID-19 were discussed with the patient and how to seek care for testing (follow up with PCP or arrange E-visit).  The importance of social distancing was discussed today.  Time:   Today, I have spent 18 minutes with the patient with telehealth technology discussing the above problems.     Medication Adjustments/Labs and Tests Ordered: Current medicines are reviewed at length with the patient today.  Concerns regarding medicines are outlined above.   Tests Ordered: No orders of the defined types were placed in this encounter.   Medication Changes: No orders of the defined types were placed in this encounter.   Follow Up:  In Person in 3 week(s)  Signed, Leanor Kail, [place]  08/01/2020 10:21 AM    [place]

## 2020-08-01 NOTE — Telephone Encounter (Signed)
  Patient Consent for Virtual Visit         Patricia Garcia has provided verbal consent on 08/01/2020 for a virtual visit (video or telephone).   CONSENT FOR VIRTUAL VISIT FOR:  Patricia Garcia  By participating in this virtual visit I agree to the following:  I hereby voluntarily request, consent and authorize [facility] and its employed or contracted physicians, physician assistants, nurse practitioners or other licensed health care professionals (the Practitioner), to provide me with telemedicine health care services (the "Services") as deemed necessary by the treating Practitioner. I acknowledge and consent to receive the Services by the Practitioner via telemedicine. I understand that the telemedicine visit will involve communicating with the Practitioner through live audiovisual communication technology and the disclosure of certain medical information by electronic transmission. I acknowledge that I have been given the opportunity to request an in-person assessment or other available alternative prior to the telemedicine visit and am voluntarily participating in the telemedicine visit.  I understand that I have the right to withhold or withdraw my consent to the use of telemedicine in the course of my care at any time, without affecting my right to future care or treatment, and that the Practitioner or I may terminate the telemedicine visit at any time. I understand that I have the right to inspect all information obtained and/or recorded in the course of the telemedicine visit and may receive copies of available information for a reasonable fee.  I understand that some of the potential risks of receiving the Services via telemedicine include:  Marland Kitchen Delay or interruption in medical evaluation due to technological equipment failure or disruption; . Information transmitted may not be sufficient (e.g. poor resolution of images) to allow for appropriate medical decision making by the  Practitioner; and/or  . In rare instances, security protocols could fail, causing a breach of personal health information.  Furthermore, I acknowledge that it is my responsibility to provide information about my medical history, conditions and care that is complete and accurate to the best of my ability. I acknowledge that Practitioner's advice, recommendations, and/or decision may be based on factors not within their control, such as incomplete or inaccurate data provided by me or distortions of diagnostic images or specimens that may result from electronic transmissions. I understand that the practice of medicine is not an exact science and that Practitioner makes no warranties or guarantees regarding treatment outcomes. I acknowledge that a copy of this consent can be made available to me via my patient portal ([facility]), or I can request a printed copy by calling the office of [facility].    I understand that my insurance will be billed for this visit.   I have read or had this consent read to me. . I understand the contents of this consent, which adequately explains the benefits and risks of the Services being provided via telemedicine.  . I have been provided ample opportunity to ask questions regarding this consent and the Services and have had my questions answered to my satisfaction. . I give my informed consent for the services to be provided through the use of telemedicine in my medical care

## 2020-08-02 ENCOUNTER — Other Ambulatory Visit: Payer: Self-pay

## 2020-08-02 ENCOUNTER — Other Ambulatory Visit: Payer: Self-pay | Admitting: *Deleted

## 2020-08-02 DIAGNOSIS — M1610 Unilateral primary osteoarthritis, unspecified hip: Secondary | ICD-10-CM | POA: Insufficient documentation

## 2020-08-02 DIAGNOSIS — I214 Non-ST elevation (NSTEMI) myocardial infarction: Secondary | ICD-10-CM | POA: Insufficient documentation

## 2020-08-02 DIAGNOSIS — J301 Allergic rhinitis due to pollen: Secondary | ICD-10-CM | POA: Insufficient documentation

## 2020-08-02 DIAGNOSIS — I700 Atherosclerosis of aorta: Secondary | ICD-10-CM | POA: Insufficient documentation

## 2020-08-02 DIAGNOSIS — I25119 Atherosclerotic heart disease of native coronary artery with unspecified angina pectoris: Secondary | ICD-10-CM | POA: Insufficient documentation

## 2020-08-02 DIAGNOSIS — N1831 Chronic kidney disease, stage 3a: Secondary | ICD-10-CM | POA: Insufficient documentation

## 2020-08-02 DIAGNOSIS — M47819 Spondylosis without myelopathy or radiculopathy, site unspecified: Secondary | ICD-10-CM | POA: Insufficient documentation

## 2020-08-02 DIAGNOSIS — K909 Intestinal malabsorption, unspecified: Secondary | ICD-10-CM | POA: Insufficient documentation

## 2020-08-02 DIAGNOSIS — I5033 Acute on chronic diastolic (congestive) heart failure: Secondary | ICD-10-CM | POA: Insufficient documentation

## 2020-08-02 DIAGNOSIS — I110 Hypertensive heart disease with heart failure: Secondary | ICD-10-CM | POA: Insufficient documentation

## 2020-08-02 DIAGNOSIS — M7061 Trochanteric bursitis, right hip: Secondary | ICD-10-CM

## 2020-08-02 DIAGNOSIS — S72009A Fracture of unspecified part of neck of unspecified femur, initial encounter for closed fracture: Secondary | ICD-10-CM

## 2020-08-02 DIAGNOSIS — I129 Hypertensive chronic kidney disease with stage 1 through stage 4 chronic kidney disease, or unspecified chronic kidney disease: Secondary | ICD-10-CM | POA: Insufficient documentation

## 2020-08-02 DIAGNOSIS — I255 Ischemic cardiomyopathy: Secondary | ICD-10-CM | POA: Insufficient documentation

## 2020-08-02 DIAGNOSIS — I2510 Atherosclerotic heart disease of native coronary artery without angina pectoris: Secondary | ICD-10-CM | POA: Insufficient documentation

## 2020-08-02 DIAGNOSIS — E785 Hyperlipidemia, unspecified: Secondary | ICD-10-CM | POA: Insufficient documentation

## 2020-08-02 DIAGNOSIS — H9190 Unspecified hearing loss, unspecified ear: Secondary | ICD-10-CM | POA: Insufficient documentation

## 2020-08-02 NOTE — Patient Outreach (Addendum)
[facility]) Care Management  08/02/2020  Patricia Garcia [birth year] [id number]   [facility] Telephone outreach for complex care   Referral Date:05/11/20 Referral Source:Victoria Doug Sou, [facility] RN CM hospital liaison Referral Reason:Please assign to [place] Coordinator for complex care PCP does the [facility] follow up and disease management follow up calls and assess for further needs  Insurance:NextGen Medicareandblue cross and blue shieldsupplement  Last admission 05/06/20 to 05/10/20 Non ST elevation myocardial infarction (NSTEMI), CHF  05/02/20 to 05/04/20 right femoral fractures/pTHA by Dr. Lyla Glassing d/c   Last outreach on 07/02/20  [facility] Unsuccessful outreach Outreach attempt to the home number x 2  No answer. [facility] RN CM was not able to leave voicemail messages as the phone rang until the fax machine picked up.   Outreach to patient's mobile number x 1. She answered and reported,  "this is a good day" as she reports she "Just left the eye doctor's office with good news" She is being transported by her friend, Theodoro Doing   Starting outpatient rehab on 08/07/20 for her hip mobility at [address] [facility] location  Theodoro Doing will be available to take her to her first session   She is interested in transportation services   Believes she may not have been tolerating some of her cardiac medications (Toprol, Zetia) Developed dizziness and  palpitations Dr Johney Frame is adjusting medicines prn  Loss 10 lbs unintentional   Voiced she still is interested in attending cardiac rehab after outpatient rehab Epic indicates on 06/20/20 her son [name] collaborated with Charlestine Night, RN who notes that pt will be scheduled for cardiac rehab orientation once cleared by ortho [facility] RN CM updated [place] via in basket   Patient Active Problem List   Diagnosis Date Noted  . NSTEMI (non-ST elevated myocardial infarction) ([place]) 05/07/2020  . Postoperative  anemia 05/07/2020  . Acute exacerbation of CHF (congestive heart failure) ([place]) 05/06/2020  . Acute respiratory failure with hypoxia ([place]) 05/06/2020  . LBBB (left bundle branch block) 05/06/2020  . Sinus congestion 05/06/2020  . Femur fracture, right ([place]) 04/30/2020  . Fracture of neck of femur ([place]) 04/30/2020  . Chronic abdominal pain 09/23/2011  . Gas pain 09/23/2011  . Constipation, slow transit 09/23/2011  . Anorectal pain 09/23/2011  . GASTRITIS 06/26/2010  . Slow transit constipation 06/21/2010  . RECTAL BLEEDING 06/21/2010  . Vitamin D deficiency 06/20/2010  . Anxiety state 06/20/2010  . GLAUCOMA 06/20/2010  . Cataract 06/20/2010  . Benign essential hypertension 06/20/2010  . Chronic sinusitis 06/20/2010  . GERD 06/20/2010  . Disorder of skeletal system 06/20/2010  . Abdominal pain 06/20/2010  . MICROALBUMINURIA 06/20/2010  . Gastroesophageal reflux disease 06/20/2010     Plan: Referred to [facility] care guide for transportation resources Patient agrees to care plan and follow up Arley scheduled this patient for another call attempt within 4-7 business days  Goals Addressed              This Visit's Progress     Patient Stated   .  [facility]) Find Help in My Community (pt-stated)   On track     Follow Up Date 08/06/20   - call 211 when I need some help - follow-up on any referrals for help I am given - have a back-up plan     Notes: 08/02/20 Care guide referral for transportation    .  [facility]) Prevent Falls and Injury (pt-stated)   On track  Follow Up Date 08/06/20   - always use handrails on the stairs - always wear low-heeled or flat shoes or slippers with nonskid soles - keep my cell phone with me always - use a cane or walker - wear my glasses and/or hearing aid - attend therapy     Notes: 08/02/20 last fall was in 04/2020 starting outpatient rehab on 08/07/20    .  [facility]) Track and Manage Symptoms (pt-stated)   On track     Follow Up Date 08/06/20    - eat more whole grains, fruits and vegetables, lean meats and healthy fats - follow rescue plan if symptoms flare-up - know when to call the doctor - track symptoms and what helps feel better or worse - dress right for the weather, hot or cold     Notes: 08/02/20 monitoring symptoms, starting outpatient rehab on 08/07/20 for hip mobility       Dhamar Gregory L. Lavina Hamman, RN, BSN, [place] Coordinator Office number [phone] Mobile number [phone]  Main [facility] number [phone] Fax number [phone]

## 2020-08-03 ENCOUNTER — Telehealth: Payer: Self-pay | Admitting: Geriatric Medicine

## 2020-08-03 NOTE — Telephone Encounter (Signed)
   Telephone encounter was:  Successful.  08/03/2020 Name: Patricia Garcia MRN: [id number] DOB: 1933/06/21  Patricia Garcia is a 87 y.o. year old female who is a primary care patient of [place], Christiane Ha, MD . The community resource team was consulted for assistance with Transportation Needs   Care guide performed the following interventions: Patient provided with information about care guide support team and interviewed to confirm resource needs Discussed resources to assist with transportation. I emailed her a list of local transportation and discussed [place] as a possible choice for her because she thinks that the public system is a little too complicated.   Patient confirmed that she got the email and she has her grand daughter to help her download the app on her phone. .  Follow Up Plan:  No further follow up planned at this time. The patient has been provided with needed resources.  [place], Care Management Phone: [phone] Email: [email or web address]

## 2020-08-06 ENCOUNTER — Other Ambulatory Visit: Payer: Self-pay | Admitting: *Deleted

## 2020-08-06 ENCOUNTER — Other Ambulatory Visit: Payer: Self-pay

## 2020-08-06 NOTE — Patient Outreach (Signed)
[facility]) Care Management  08/06/2020  Patricia Garcia 12-Oct-1932 [id number]  [facility] Telephoneoutreach for complex care   Referral Date:05/11/20 Referral Source:Patricia Garcia [facility] RN CM hospital liaison Referral Reason:Please assign to [place] Coordinator for complex care PCP does the [facility] follow up and disease management follow up calls and assess for further needs  Insurance:NextGen Medicareandblue cross and blue shieldsupplement  Last admission 05/06/20 to 10/21/21Non ST elevation myocardial infarction (NSTEMI), CHF  05/02/20 to 10/15/21right femoral fractures/pTHA by Dr. Swinteckd/c  Last outreach on 08/02/20   Follow up assessment  Patricia Garcia's focus of concern today is related to recent symptoms she feels is possibly related to [place] (as this is the newest change in her treatment plan) that is leading to anxiety and a loss of control  Symptoms she is reporting  "I am having some sleepless nights", "dizzy spells", looking down makes me dizzy", "lots of belching is new","a sensation in my left ear that went to the left side of my face, down my neck to my left arm to my left leg", loss/poor of appetite with possible low blood sugar and has loss weight since hospitalization  Reported loss of weight from 130 to 118 lbs per her home scales   She shares an experience in which she woke up at 0100 with symptoms described as "things started happening in my body I can't explain it" She called her son Patricia Garcia and described the symptoms to him. Patricia Garcia has a history of diabetes, informed her symptoms sound similar to his hypoglycemic episodes He encouraged her to eat and her symptoms resolved Her son was reported to encouraged her to calm down, she took anxiety medicine and Patricia Garcia stayed with her that night  She reports spoke with "man" cardiology staff. She informs [facility] RN CM she had difficulty understanding some of the conversation related to dialect issues  and found it not to be beneficial She states he mentioned Plavix as a possible change for her  She inquired about the side effects of Plavix. She and [facility] RN CM discussed side effects of Plavix to include easy bleeding/bruising, stomach upset/pain, diarrhea and constipation She voiced understanding  BP parameter > 160    Sees Dr Johney Frame, cardiologist on 08/24/20 Children are concern, encouraged her to eat and drink  At different intervals Patricia Garcia will call her daily to keep her awake  Reviewed [place]illi[place] side and Plavix effects  Reviewed symptoms of a myocardial infarction (MI)  Discussed home care action plans to include her monitoring her BP, CBG at home, improving her appetite during the day with a night snack, promoted better sleep Discussed her getting a glucometer She already has a BP cuff She voiced interest in getting a pulse oximeter  She reports she has switched out her chewable Aspirin 81 mg to an en coated 81 mg one  Discussed hypotension causes and home action plan Encouraged her to report abnormal values to her MDs   Transportation [place], Care Guide called her for transportation resources but she is not preferring to use the ones offered. Patricia Garcia also spoke to her about [facility] Po[facility] and she likes this better than other resources presented. After speaking with Patricia Garcia she spoke with Mr Patricia Garcia (10) offered to take her to her appointments Pt sent an e-mail with all transportation resources  Outreach to [place] [employer] [place] [address] with her but the company is closed on 08/06/20 for the holiday She will get her grand daughter to assist her with getting  familiar with the use Patricia Garcia  Depression symptoms All of these have caused me to become depressed, have a loss of interested and "crying spells.  I had one this morning" She voices she is missing being independent and her socialization interventions related to covid precautions, her recovery from her hospitalization and recent symptoms. "I am use  to being independent and now I am dependent on others  "Patricia Garcia is the name of my car because she is hot" has not been driven "I don't think I will ever get back to my normal life" Encouraged positive talking Not on antidepressant     Patient Active Problem List   Diagnosis Date Noted   Acute non-ST segment elevation myocardial infarction ([place]) 08/02/2020   Acute on chronic diastolic heart failure ([place]) 08/02/2020   Allergic rhinitis due to pollen 08/02/2020   Arteriosclerotic heart disease 08/02/2020   Chronic kidney disease, stage 3a ([place]) 08/02/2020   Dyslipidemia 08/02/2020   Hardening of the aorta (main artery of the heart) ([place]) 08/02/2020   Hearing loss 08/02/2020   Hypertensive renal disease 08/02/2020   Hypertensive heart failure ([place]) 08/02/2020   Intestinal malabsorption 08/02/2020   Ischemic cardiomyopathy 08/02/2020   Primary localized osteoarthritis of pelvic region and thigh 08/02/2020   Spondylosis without myelopathy 08/02/2020   NSTEMI (non-ST elevated myocardial infarction) ([place]) 05/07/2020   Postoperative anemia 05/07/2020   Acute exacerbation of CHF (congestive heart failure) ([place]) 05/06/2020   Acute respiratory failure with hypoxia ([place]) 05/06/2020   LBBB (left bundle branch block) 05/06/2020   Sinus congestion 05/06/2020   Femur fracture, right ([place]) 04/30/2020   Fracture of neck of femur ([place]) 04/30/2020   Chronic abdominal pain 09/23/2011   Gas pain 09/23/2011   Constipation, slow transit 09/23/2011   Anorectal pain 09/23/2011   GASTRITIS 06/26/2010   Slow transit constipation 06/21/2010   RECTAL BLEEDING 06/21/2010   Vitamin D deficiency 06/20/2010   Generalized anxiety disorder 06/20/2010   GLAUCOMA 06/20/2010   Cataract 06/20/2010   Benign essential hypertension 06/20/2010   Chronic sinusitis 06/20/2010   GERD 06/20/2010   Disorder of skeletal system 06/20/2010   Abdominal pain 06/20/2010   MICROALBUMINURIA  06/20/2010   Gastroesophageal reflux disease 06/20/2010   Plans  [facility] RN CM will outreach to pt within the next 7-10 business days Pt encouraged to return a call to [facility] RN CM prn   Mabrey Howland L. Lavina Hamman, RN, BSN, [place] Coordinator Office number [phone] Main [facility] number [phone] Fax number [phone]

## 2020-08-07 DIAGNOSIS — M25551 Pain in right hip: Secondary | ICD-10-CM | POA: Diagnosis not present

## 2020-08-10 ENCOUNTER — Other Ambulatory Visit: Payer: Self-pay | Admitting: *Deleted

## 2020-08-10 ENCOUNTER — Other Ambulatory Visit: Payer: Self-pay

## 2020-08-10 NOTE — Patient Outreach (Signed)
Patricia [facility]) Care Management  08/10/2020  Patricia Garcia [birth year] [id number]   [facility] Telephoneoutreachfor complex care  Referral Date:05/11/20 Referral Source:Victoria Doug Sou [facility] RN CM hospital liaison Referral Reason:Please assign to [place] Coordinator for complex care PCP does the [facility] follow up and disease management follow up calls and assess for further needs  Insurance:NextGen Medicareandblue cross and blue shieldsupplement  Last admission 05/06/20 to 10/21/21Non ST elevation myocardial infarction (NSTEMI), CHF  05/02/20 to 10/15/21right femoral fractures/pTHA by Dr. Swinteckd/c  Last outreach on 08/02/20   Follow up assessment Patient is able to verify HIPAA ([place]) identifiers Reviewed and addressed the purpose of the follow up call with the patient  Consent: [facility] RN CM reviewed [facility] services with patient. Patient gave verbal consent for services.   Went to Physical Therapy (PT) on 08/18/19, was evaluated, shared her symptoms with female therapy and he has offered further therapy for neck pain Returned Friday 08/17/20  She reports she still feels she is having symptoms (dizziness, poor appetite, poor sleep from [name] and used xanax last night to get better sleep. Report she is still having sleeping issues and constant encouragement from her son and daughter in law, [name] to eat more.  They went grocery shopping for her on 08/09/20 She has also been on Celexa 20 mg - Half a tab daily since 07/09/20 and xanax prn    [facility] RN CM intervention [place] referral for side effects of medications [phone] transportation No answer unable to reach staff to share her benefit information [name] report [name] is going to be available to take her to appointments prn    [facility] RN CM will follow up with patient within the next 21 business days Pt  encouraged to return a call to [facility] RN CM prn  Goals Addressed              This Visit's Progress     Patient Stated   .  [facility]) Find Help in My Community (pt-stated)   On track     Follow Up Date 08/24/20   - call 211 when I need some help - follow-up on any referrals for help I am given - have a back-up plan    Why is this important?   Knowing how and where to find help for yourself or family in your neighborhood and community is an important skill.  You will want to take some steps to learn how.    Notes: 08/10/20 unsuccessful attempt to assist her in finding out her [employer] transportation benefits No answer at [phone]  08/06/20 She confirmed she received a call from Care guide, [name] 08/02/20 Care guide referral for transportation    .  [facility]) Manage My Medicine (pt-stated)   Not on track     Timeframe:  Short-Term Goal Priority:  High Start Date:               08/10/20              Expected End Date:      09/17/20                 Follow Up Date  08/24/20    - call if I am sick and can't take my medicine - keep a list of all the medicines I take; vitamins and herbals too    Notes: 08/10/20 [facility] pharmacy referral ordered for continued  reported side effects per pt from [place]     .  [facility]) Prevent Falls and Injury (pt-stated)   On track     Follow Up Date 08/24/20   - always use handrails on the stairs - always wear low-heeled or flat shoes or slippers with nonskid soles - keep my cell phone with me always - use a cane or walker - wear my glasses and/or hearing aid - attend therapy     Notes: 08/02/20 last fall was in 04/2020 starting outpatient rehab on 08/07/20    .  [facility]) Track and Manage Symptoms (pt-stated)   On track     Follow Up Date 08/24/20   - eat more whole grains, fruits and vegetables, lean meats and healthy fats - follow rescue plan if symptoms flare-up - know when to call the doctor - track symptoms and what helps feel better or worse - dress right for the  weather, hot or cold       Notes: 08/10/20  She reports she still feels she is having symptoms (dizziness, poor appetite, poor sleep from [place] and used xanax last night to get better sleep 08/02/20 monitoring symptoms, starting outpatient rehab on 08/07/20 for hip mobility       [name] L. Lavina Hamman, RN, BSN, [place] Coordinator Office number [phone] Main [facility] number [phone] Fax number [phone]

## 2020-08-13 NOTE — Patient Outreach (Signed)
[facility]) Care Management  08/13/2020  BRITTANNI CARIKER 10/22/1932 [id number]  Referral request from Jackelyn Poling, RN for medication assistance sent to [facility] pharmacy.  Ina Homes [facility] Management Assistant [phone]

## 2020-08-15 ENCOUNTER — Other Ambulatory Visit: Payer: Self-pay | Admitting: *Deleted

## 2020-08-15 ENCOUNTER — Telehealth (HOSPITAL_COMMUNITY): Payer: Self-pay

## 2020-08-15 NOTE — Telephone Encounter (Signed)
Pt insurance is active and benefits verified through Medicare A/B. Co-pay $0.00, DED $233.00/$233.00 met, out of pocket $0.00/$0.00 met, co-insurance 20%. No pre-authorization required. Passport, 08/15/20 @ 3:59PM, ZYS#[id number]  2ndary insurance is active and benefits verified through [employer]. Co-pay $0.00, DED $0.00/$0.00 met, out of pocket $0.00/$0.00 met, co-insurance 0%. No pre-authorization required. Passport, 08/15/20 @ 4:03PM, [id number]

## 2020-08-15 NOTE — Patient Outreach (Signed)
[place] [facility]) Care Management  08/15/2020  Patricia Garcia March 04, 1933 [id number]  [facility] Telephoneincoming call for complex care  Referral Date:05/11/20 Referral Source:Victoria Doug Sou [facility] RN CM hospital liaison Referral Reason:Please assign to [name] Coordinator for complex care PCP does the [facility] follow up and disease management follow up calls and assess for further needs  Insurance:NextGen Medicareandblue cross and blue shieldsupplement  Last admission 05/06/20 to 10/21/21Non ST elevation myocardial infarction (NSTEMI), CHF  05/02/20 to 10/15/21right femoral fractures/pTHA by Dr. Swinteckd/c     [facility] RN CM received an outreach from  Cardiac rehab scheduler, Janett Billow [phone] conferencing in with [name]  [facility] RN CM assisted in clarifying that pt is presently in outpatient rehab for her hip and would need clearance prior to starting the cardiac rehab  Discussed the importance of getting the cardiac rehab scheduled with Janett Billow at this time  Pt agreed to start cardiac rehab sessions in the afternoons, 2 days a week at 1330 Wednesdays and Fridays She starts Tuesday September 25 2020 for initial/orientation at 1315- be 15 minutes early,  comfortable shoes, covid precautions  Start 10/03/20 Wednesday 1330 -arrive 15 min early comfortable shoes, covid precautions Sessions last 4-8 weeks   [facility] RN CM will follow up with patient within the next 21 business days Pt encouraged to return a call to [facility] RN CM prn  Dara Camargo L. Lavina Hamman, RN, BSN, [place] Coordinator Office number [phone] Main [facility] number [phone] Fax number [phone]

## 2020-08-16 ENCOUNTER — Telehealth (HOSPITAL_COMMUNITY): Payer: Self-pay

## 2020-08-16 NOTE — Telephone Encounter (Signed)
Called and spoke with pt in regards to CR, conference Kim with [facility] to explain in detail CR to pt. Pt stated she has not started PT for her hip but will soon, adv pt she will not be able to attend both PT and CR due to Medicare A/B. Patient verbalized understanding.   Pt will attend CR twice a week, W&F. Patient will come in for orientation on 09/25/20 @ 1:15PM and will attend the 1:30PM exercise class.  Mailed package

## 2020-08-22 ENCOUNTER — Telehealth: Payer: Self-pay | Admitting: Cardiology

## 2020-08-22 NOTE — Telephone Encounter (Signed)
Pt c/o medication issue:  1. Name of Medication: ticagrelor (BRILINTA) 90 MG TABS tablet  2. How are you currently taking this medication (dosage and times per day)? As directed  3. Are you having a reaction (difficulty breathing--STAT)? yes  4. What is your medication issue? Patient states that she is having bleeding with her bowel movements. She would like to discuss this with Dr. Johney Frame or her nurse.

## 2020-08-22 NOTE — Progress Notes (Signed)
Cardiology Office Note:    Date:  08/24/2020   ID:  GUNDA MAQUEDA, DOB 1932/12/26, MRN [id number]  PCP:  Lajean Manes, MD  [facility] Cardiologist:  Freada Bergeron, MD  [facility] Electrophysiologist:  None   Referring MD: Lajean Manes, MD    History of Present Illness:    Patricia Garcia is a 87 y.o. female with a hx of LBBB, hypertension, diverticulosis, arthritis, depression, right femur fracture s/p THA 05/02/2020, depression, osteopenia, chronic diastolic CHF, CAD s/p recent NSTEMI who presents to clinic for follow-up.   The patient suffered a mechanical fall resulting in hip fracture requiring surgery on 05/02/20.  She was readmitted to [facility] 05/06/20 with nausea, vomiting, and hypoxia.  She was ultimately found to have a NSTEMI (trops in 3k range) and acute on chronic diastolic heart failure.  She was treated with IV Lasix.  TTE 05/07/2020 showed hypokinesis of the basal inferior and inferolateral wall, EF 50 to 95%, grade 2 diastolic dysfunction, elevated left atrial pressure, mild mitral regurgitation.  She underwent cardiac cath 05/08/2020 showing three-vessel obstructive coronary artery disease as outlined below ultimately received DES to mid RCA and first diagonal.  Hospitalization was also notable for anemia requiring blood transfusion, hypokalemia, gait instability, hypokalemia, hypoalbuminemia, mild AKI, and nocturnal confusion.  She was seen by Melina Copa on 05/22/20 where she was doing very well, but today the patient states she feels very unwell. Specifically, she states over the past month she has been feeling awful and not like herself. She has been feeling more fatigued, nervous, depressed and listless. She has to lay down after minimal activity. Noted blood in her stool about 4 days. Initially started with some straining but since that time her stool have been soft. No black or tarry stool. Has been dizzy in with position changes and rolling over in  bed. No orthopnea, PND, LE edema.  Past Medical History:  Diagnosis Date  . Anal or rectal pain   . Anemia   . Arthritis   . CAD (coronary artery disease)    a. NSTEMI 04/2020 s/p DES to [facility] and D1.  . Chronic diastolic CHF (congestive heart failure) ([place])   . Depression   . Diverticulosis of colon (without mention of hemorrhage)   . Hypertension   . Hypoalbuminemia   . Internal hemorrhoids without mention of complication   . Ischemic heart disease   . LBBB (left bundle branch block)   . Osteopenia   . Renal insufficiency   . S/P right hip fracture   . Slow transit constipation     Past Surgical History:  Procedure Laterality Date  . ABDOMINAL HYSTERECTOMY    . CATARACT EXTRACTION, BILATERAL    . CORONARY STENT INTERVENTION N/A 05/08/2020   Procedure: CORONARY STENT INTERVENTION;  Surgeon: Martinique, Peter M, MD;  Location: [place] [facility];  Service: Cardiovascular;  Laterality: N/A;  . LEFT HEART CATH AND CORONARY ANGIOGRAPHY N/A 05/08/2020   Procedure: LEFT HEART CATH AND CORONARY ANGIOGRAPHY;  Surgeon: Martinique, Peter M, MD;  Location: [place] CV LAB;  Service: Cardiovascular;  Laterality: N/A;  . MASS EXCISION  06/10/2011   Procedure: EXCISION MASS;  Surgeon: Wynonia Sours, MD;  Location: [place];  Service: Orthopedics;  Laterality: Right;  excision cyst right thumb IP, debridement IP right thumb  . TONSILLECTOMY    . TOTAL HIP ARTHROPLASTY Right 05/02/2020   Procedure: TOTAL HIP ARTHROPLASTY ANTERIOR APPROACH;  Surgeon: Rod Can, MD;  Location:  [facility];  Service: Orthopedics;  Laterality: Right;    Current Medications: Current Meds  Medication Sig  . aspirin 81 MG chewable tablet aspirin 81 mg chewable tablet  . Calcium Carbonate-Vit D-Min (CALCIUM 1200 PO) Take 1,200 mg by mouth daily.  . cholecalciferol (VITAMIN D) 1000 UNITS tablet Take 1,000 Units by mouth daily.  . clopidogrel (PLAVIX) 75 MG tablet Take 1 tablet (75 mg total) by mouth  daily.  Marland Kitchen docusate sodium (COLACE) 100 MG capsule Take 1 capsule (100 mg total) by mouth 2 (two) times daily. (Patient taking differently: Take 100 mg by mouth 3 (three) times a week.)  . esomeprazole (NEXIUM) 40 MG capsule Take 1 capsule (40 mg total) by mouth daily.  . ferrous sulfate 325 (65 FE) MG tablet Take 325 mg by mouth See admin instructions. 3 times a week  . latanoprost (XALATAN) 0.005 % ophthalmic solution Place 1 drop into both eyes at bedtime.  Marland Kitchen losartan (COZAAR) 50 MG tablet Take 1 tablet (50 mg total) by mouth daily.  . multivitamin-iron-minerals-folic acid (CENTRUM) chewable tablet Chew 1 tablet by mouth daily.  . Naphazoline-Pheniramine (OPCON-A OP) Place 1 drop into both eyes daily as needed (for eye allergy).  . sodium chloride (MURO 128) 2 % ophthalmic solution Place 1 drop into both eyes 2 (two) times daily as needed for eye irritation.  . [DISCONTINUED] citalopram (CELEXA) 20 MG tablet Take 20 mg by mouth daily. Half tablet a day started 07/09/20  . [DISCONTINUED] cloNIDine (CATAPRES) 0.1 MG tablet Take 0.1 mg by mouth daily.   . [DISCONTINUED] famotidine (PEPCID) 40 MG tablet Take 40 mg by mouth daily.     Allergies:   Penicillins, Prednisone, Statins, Sulfonamide derivatives, and Tramadol   Social History   Socioeconomic History  . Marital status: Widowed    Spouse name: Not on file  . Number of children: 2  . Years of education: Not on file  . Highest education level: Not on file  Occupational History  . Occupation: Retired    Fish farm manager: RETIRED  Tobacco Use  . Smoking status: Former Smoker    Quit date: 06/05/1984    Years since quitting: 36.2  . Smokeless tobacco: Never Used  Substance and Sexual Activity  . Alcohol use: No  . Drug use: No  . Sexual activity: Not on file  Other Topics Concern  . 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 Worried About Charity fundraiser in the Last Year: Never true  . Ran Out of Food in the Last Year: Never true  Transportation Needs: No Transportation Needs  . Lack of Transportation (Medical): No  . Lack of Transportation (Non-Medical): No  Physical Activity: Not on file  Stress: Not on file  Social Connections: Moderately Integrated  . Frequency of Communication with Friends and Family: More than three times a week  . Frequency of Social Gatherings with Friends and Family: More than three times a week  . Attends Religious Services: 1 to 4 times per year  . Active Member of Clubs or Organizations: Yes  . Attends Archivist Meetings: 1 to 4 times per year  . Marital Status: Widowed     Family History: The patient's family history includes Cancer  in her father; Colon polyps in her mother; Heart disease in her father.  ROS:   Please see the history of present illness.    Review of Systems  Constitutional: Positive for malaise/fatigue. Negative for chills and fever.  HENT: Negative for nosebleeds.   Eyes: Negative for blurred vision.  Respiratory: Positive for shortness of breath.   Cardiovascular: Negative for chest pain, palpitations, orthopnea, claudication, leg swelling and PND.  Gastrointestinal: Positive for blood in stool. Negative for abdominal pain.  Genitourinary: Negative for hematuria.  Musculoskeletal: Positive for joint pain and myalgias.  Neurological: Positive for dizziness. Negative for loss of consciousness.  Endo/Heme/Allergies: Negative for polydipsia.  Psychiatric/Behavioral: Positive for depression. The patient is nervous/anxious and has insomnia.     EKGs/Labs/Other Studies Reviewed:    The following studies were reviewed today: Cardiac Cath 05/08/20  1st Diag lesion is 85% stenosed.  Ramus lesion is 30% stenosed.  Prox Cx to Mid Cx  lesion is 90% stenosed.  Prox RCA to Mid RCA lesion is 95% stenosed.  Post intervention, there is a 0% residual stenosis.  A drug-eluting stent was successfully placed using a STENT RESOLUTE ONYX 3.0X15.  Post intervention, there is a 0% residual stenosis.  A drug-eluting stent was successfully placed using a [place] 2.25X15.  LV end diastolic pressure is mildly elevated.  1. 3 vessel obstructive CAD - 95% mid RCA- this appeared to be the culprit lesion - 85% proxima first diagonal - 90% very small LCx in the AV groove 2. Mildly elevated LVEDP 3. Successful PCI of the mid RCA with DES x 1 4. Successful PCI of the first diagonal with DES x 1  Plan: DAPT for one year. Will transition lasix to po. Further plans per primary team.   Echo 05/07/20 IMPRESSIONS  1. Hypokinesis of the basal inferior and inferolateral wall with overall  preserved LV function.  2. Left ventricular ejection fraction, by estimation, is 50 to 55%. The  left ventricle has low normal function. The left ventricle demonstrates  regional wall motion abnormalities (see scoring diagram/findings for  description). Left ventricular diastolic  parameters are consistent with Grade II diastolic dysfunction  (pseudonormalization). Elevated left atrial pressure.  3. Right ventricular systolic function is normal. The right ventricular  size is normal. There is mildly elevated pulmonary artery systolic  pressure.  4. Moderate pleural effusion in the left lateral region.  5. The mitral valve is normal in structure. Mild mitral valve  regurgitation. No evidence of mitral stenosis.  6. The aortic valve is tricuspid. Aortic valve regurgitation is trivial.  No aortic stenosis is present.  7. The inferior vena cava is normal in size with greater than 50%  respiratory variability, suggesting right atrial pressure of 3 mmHg.   FINDINGS  Left Ventricle: Left ventricular ejection fraction, by  estimation, is 50  to 55%. The left ventricle has low normal function. The left ventricle  demonstrates regional wall motion abnormalities. The left ventricular  internal cavity size was normal in  size. There is no left ventricular hypertrophy. Left ventricular diastolic  parameters are consistent with Grade II diastolic dysfunction  (pseudonormalization). Elevated left atrial pressure.   Right Ventricle: The right ventricular size is normal.Right ventricular  systolic function is normal. There is mildly elevated pulmonary artery  systolic pressure. The tricuspid regurgitant velocity is 3.08 m/s, and  with an assumed right atrial pressure of  3 mmHg, the estimated right ventricular systolic pressure is 99991111 mmHg.   Left Atrium: Left atrial  size was normal in size.   Right Atrium: Right atrial size was normal in size.   Pericardium: There is no evidence of pericardial effusion.   Mitral Valve: The mitral valve is normal in structure. Mild mitral annular  calcification. Mild mitral valve regurgitation. No evidence of mitral  valve stenosis.   Tricuspid Valve: The tricuspid valve is normal in structure. Tricuspid  valve regurgitation is mild . No evidence of tricuspid stenosis.   Aortic Valve: The aortic valve is tricuspid. Aortic valve regurgitation is  trivial. No aortic stenosis is present.   Pulmonic Valve: The pulmonic valve was normal in structure. Pulmonic valve  regurgitation is trivial. No evidence of pulmonic stenosis.   Aorta: The aortic root is normal in size and structure.   Venous: The inferior vena cava is normal in size with greater than 50%  respiratory variability, suggesting right atrial pressure of 3 mmHg.    Recent Labs: 05/06/2020: B Natriuretic Peptide 1,221.6 05/10/2020: BUN 16; Creatinine, Ser 1.28; Hemoglobin 8.9; Magnesium 2.1; Platelets 361; Potassium 3.3; Sodium 136 07/16/2020: ALT 14  Recent Lipid Panel    Component Value Date/Time   CHOL  257 (H) 07/16/2020 0904   TRIG 240 (H) 07/16/2020 0904   HDL 49 07/16/2020 0904   CHOLHDL 5.2 (H) 07/16/2020 0904   CHOLHDL 4.4 05/08/2020 0457   VLDL 25 05/08/2020 0457   LDLCALC 163 (H) 07/16/2020 0904     Physical Exam:    VS:  BP (!) 150/80   Pulse 99   Ht 5\' 2"  (1.575 m)   Wt 122 lb (55.3 kg)   SpO2 97%   BMI 22.31 kg/m     Wt Readings from Last 3 Encounters:  08/24/20 122 lb (55.3 kg)  08/01/20 121 lb (54.9 kg)  06/01/20 121 lb (54.9 kg)     GEN:  Tearful on exam HEENT: Normal NECK: No JVD; No carotid bruits CARDIAC: RRR, no murmurs, rubs, gallops RESPIRATORY:  Clear to auscultation without rales, wheezing or rhonchi  ABDOMEN: Soft, non-tender, non-distended MUSCULOSKELETAL:  No edema; No deformity  SKIN: Warm and dry NEUROLOGIC:  Alert and oriented x 3 PSYCHIATRIC:  Very tearful  ASSESSMENT:    1. Chronic diastolic CHF (congestive heart failure) ([place])   2. Anemia, unspecified type   3. Pleural effusion   4. Gastrointestinal hemorrhage, unspecified gastrointestinal hemorrhage type   5. CAD in native artery   6. Essential hypertension   7. Non-ST elevation myocardial infarction (NSTEMI), subsequent episode of care ([place])   8. Hyperlipidemia LDL goal <70    PLAN:    In order of problems listed above:  #NSTEMI: #Multivessel CAD: Patient with recent hospitalization in 04/2020 for NSTEMI found to have multivessel CAD s/p successful PCI to RCA and D1 on 05/08/20. She was doing well initially but now with increasing fatigue, DOE, and decreased energy. Also with new blood in her stool over the past week. Patient was very concerned this was all related to her ticagrelor which has subsequently been changed to plavix. Notably she stopped her metoprolol and zetia as she felt like they made her feel poorly. -TTE withLVEF of 50-55% with hypokinesis of the basal inferior and inferolateral wall, grade 2 diastolic dysfunction, moderate left pleural effusion, and mildly  elevated PASP -Changed ticagrelor to plavix due to BRBPR -Continue ASA 81mg  daily -Patient unwilling to resume BB or zetia at this time due to reported side-effects -Start losartan 50mg  daily; repeat BMET at next visit in 2 weeks -Start cardiac rehab -Agree  with referral to PCP to help depression symptoms -Stop clonidine and will manage GIB as below as likely contributing to fatigue  #Chronic Diastolic CHF: Appears euvolemic today. Not on diuretic therapy -Stopped metop due to side-effects--unwilling to re-trial -Start losartan 50mg  daily -Low Na diet -Daily weights  #Blood in Stool #Anemia: -Check CBC -Continue iron supplementation -Refer to GI -Change famotidine to nexium -Needs to continue uninterrupted DAPT due to recent PCI  #Hypertension: - Stop clonidine as likely causing blood pressure swings -Start losartan 50mg  daily; BMET in 2 weeks -Patient stopped metop as above due to side effects  #Fatigue: Likely related to medication effect (clonidine) as well as possible bleeding. Patient also reports depression which is also likely contributing. -Stop clonidine as above -Repeat CXR to ensure no reaccumulation of pleural effusion -Changed ticagrelor to plavix  #Vertigo: -Recommend follow-up with PCP  #Depression: -Follow-up with PCP   Medication Adjustments/Labs and Tests Ordered: Current medicines are reviewed at length with the patient today.  Concerns regarding medicines are outlined above.  Orders Placed This Encounter  Procedures  . DG Chest 2 View  . CBC  . Basic metabolic panel  . Ambulatory referral to Gastroenterology   Meds ordered this encounter  Medications  . esomeprazole (NEXIUM) 40 MG capsule    Sig: Take 1 capsule (40 mg total) by mouth daily.    Dispense:  90 capsule    Refill:  1  . losartan (COZAAR) 50 MG tablet    Sig: Take 1 tablet (50 mg total) by mouth daily.    Dispense:  90 tablet    Refill:  1    Patient Instructions   Medication Instructions:   START TAKING:     1. LOSARTAN 50 MG ONCE A DAY   2. NEXIUM  40 MG ONCE A DAY    STOP TAKING: CLONIDINE 01. MG   *If you need a refill on your cardiac medications before your next appointment, please call your pharmacy*   Lab Work:  [place]   If you have labs (blood work) drawn today and your tests are completely normal, you will receive your results only by: Marland Kitchen MyChart Message (if you have MyChart) OR . A paper copy in the mail If you have any lab test that is abnormal or we need to change your treatment, we will call you to review the results.   Testing/Procedures: A chest x-ray takes a picture of the organs and structures inside the chest, including the heart, lungs, and blood vessels. This test can show several things, including, whether the heart is enlarges; whether fluid is building up in the lungs; and whether pacemaker / defibrillator leads are still in place.  Located in: [facility] Address: [address], [place], [place] [address] Areas served: [place] and nearby areas Hours:  Saturday Closed Sunday Closed Monday 8AM-5PM Tuesday 8AM-5PM Wednesday 8AM-5PM Thursday 8AM-5PM Friday 8AM-5PM  Phone: [phone]   Follow-Up: At [facility], you and your health needs are our priority.  As part of our continuing mission to provide you with exceptional heart care, we have created designated Provider Care Teams.  These Care Teams include your primary Cardiologist (physician) and Advanced Practice Providers (APPs -  Physician Assistants and Nurse Practitioners) who all work together to provide you with the care you need, when you need it.  We recommend signing up for the patient portal called "MyChart".  Sign up information is provided on this After Visit Summary.  MyChart is used to connect with patients for Virtual Visits (Telemedicine).  Patients are able to view lab/test results, encounter notes,  upcoming appointments, etc.  Non-urgent messages can be sent to your provider as well.   To learn more about what you can do with MyChart, go to [email or web address].    Your next appointment:    REFFERAL TO GASTROENTEROLOGIST   2 week(s)  The format for your next appointment:   In Person  Provider:   Gwyndolyn Kaufman, MD   Other Instructions      Signed, Freada Bergeron, MD  08/24/2020 11:18 AM    [place]

## 2020-08-22 NOTE — Telephone Encounter (Signed)
Patient complaining of bright red blood when she has a BM. This has been going on for one week and she has at least 1 BM a day. Patient taking stool softener. Patient has appointment with Dr. Johney Frame on Friday. Patient stated she is having other side effects with Brilinta, like being very emotional and depressed, no appetite, and had SOB when she first started Brilinta, but started drinking Coke and this helps. Will forward to Dr. Johney Frame for advisement.

## 2020-08-23 MED ORDER — CLOPIDOGREL BISULFATE 75 MG PO TABS: 75.0000 mg | ORAL_TABLET | Freq: Every day | ORAL | 3 refills | Status: DC

## 2020-08-23 NOTE — Addendum Note (Signed)
Addended by: Aris Georgia, Shlomie Romig L on: 08/23/2020 11:28 AM   Modules accepted: Orders

## 2020-08-23 NOTE — Telephone Encounter (Signed)
Will change to brilinta to plavix but she needs to continue DAPT. Will also refer to GI at this time. Can we also start her on nexium 40mg  BID for gastric protection.Thank you so much for following up and please have her keep [place] posted.   Gwyndolyn Kaufman, MD

## 2020-08-23 NOTE — Telephone Encounter (Signed)
Called patient back with recommendations from Dr. Johney Frame. Patient agree to take Plavix and discontinue brilinta. Patient stated she is already taking famotidine for GERD. Patient stated she does not feel like she needs to be seen by GI, since patient has appointment tomorrow with Dr. Johney Frame, she will discuss that with her. Patient stated she will be glad to be off of Brilinta, stating " it's killing me".  Patient was thankful for the call back.

## 2020-08-24 ENCOUNTER — Ambulatory Visit (INDEPENDENT_AMBULATORY_CARE_PROVIDER_SITE_OTHER): Payer: Medicare Other | Admitting: Cardiology

## 2020-08-24 ENCOUNTER — Other Ambulatory Visit: Payer: Self-pay

## 2020-08-24 ENCOUNTER — Other Ambulatory Visit: Payer: Self-pay | Admitting: *Deleted

## 2020-08-24 ENCOUNTER — Encounter: Payer: Self-pay | Admitting: Cardiology

## 2020-08-24 VITALS — BP 150/80 | HR 99 | Ht 62.0 in | Wt 122.0 lb

## 2020-08-24 DIAGNOSIS — E785 Hyperlipidemia, unspecified: Secondary | ICD-10-CM

## 2020-08-24 DIAGNOSIS — J90 Pleural effusion, not elsewhere classified: Secondary | ICD-10-CM | POA: Diagnosis not present

## 2020-08-24 DIAGNOSIS — I214 Non-ST elevation (NSTEMI) myocardial infarction: Secondary | ICD-10-CM | POA: Diagnosis not present

## 2020-08-24 DIAGNOSIS — D649 Anemia, unspecified: Secondary | ICD-10-CM

## 2020-08-24 DIAGNOSIS — K922 Gastrointestinal hemorrhage, unspecified: Secondary | ICD-10-CM | POA: Diagnosis not present

## 2020-08-24 DIAGNOSIS — I10 Essential (primary) hypertension: Secondary | ICD-10-CM | POA: Diagnosis not present

## 2020-08-24 DIAGNOSIS — I5032 Chronic diastolic (congestive) heart failure: Secondary | ICD-10-CM

## 2020-08-24 DIAGNOSIS — I2510 Atherosclerotic heart disease of native coronary artery without angina pectoris: Secondary | ICD-10-CM | POA: Diagnosis not present

## 2020-08-24 LAB — BASIC METABOLIC PANEL
BUN/Creatinine Ratio: 18 (ref 12–28)
BUN: 18 mg/dL (ref 8–27)
CO2: 23 mmol/L (ref 20–29)
Calcium: 9.7 mg/dL (ref 8.7–10.3)
Chloride: 101 mmol/L (ref 96–106)
Creatinine, Ser: 0.99 mg/dL (ref 0.57–1.00)
GFR calc Af Amer: 60 mL/min/{1.73_m2} (ref >59)
GFR calc non Af Amer: 52 mL/min/{1.73_m2} — ABNORMAL LOW (ref >59)
Glucose: 112 mg/dL — ABNORMAL HIGH (ref 65–99)
Potassium: 4.2 mmol/L (ref 3.5–5.2)
Sodium: 138 mmol/L (ref 134–144)

## 2020-08-24 LAB — CBC
Hematocrit: 35.9 % (ref 34.0–46.6)
Hemoglobin: 11.7 g/dL (ref 11.1–15.9)
MCH: 27.5 pg (ref 26.6–33.0)
MCHC: 32.6 g/dL (ref 31.5–35.7)
MCV: 85 fL (ref 79–97)
Platelets: 349 10*3/uL (ref 150–450)
RBC: 4.25 x10E6/uL (ref 3.77–5.28)
RDW: 13.1 % (ref 11.7–15.4)
WBC: 9.5 10*3/uL (ref 3.4–10.8)

## 2020-08-24 MED ORDER — LOSARTAN POTASSIUM 50 MG PO TABS: 50.0000 mg | ORAL_TABLET | Freq: Every day | ORAL | 1 refills | Status: DC

## 2020-08-24 MED ORDER — ESOMEPRAZOLE MAGNESIUM 40 MG PO CPDR: 40.0000 mg | DELAYED_RELEASE_CAPSULE | Freq: Every day | ORAL | 1 refills | Status: DC

## 2020-08-24 NOTE — Patient Instructions (Addendum)
Medication Instructions:   START TAKING:     1. LOSARTAN 50 MG ONCE A DAY   2. NEXIUM  40 MG ONCE A DAY    STOP TAKING: CLONIDINE 01. MG   *If you need a refill on your cardiac medications before your next appointment, please call your pharmacy*   Lab Work:  [place]   If you have labs (blood work) drawn today and your tests are completely normal, you will receive your results only by: Marland Kitchen MyChart Message (if you have MyChart) OR . A paper copy in the mail If you have any lab test that is abnormal or we need to change your treatment, we will call you to review the results.   Testing/Procedures: A chest x-ray takes a picture of the organs and structures inside the chest, including the heart, lungs, and blood vessels. This test can show several things, including, whether the heart is enlarges; whether fluid is building up in the lungs; and whether pacemaker / defibrillator leads are still in place.  Located in: [facility] Address: [place], [place], [place] [address] Areas served: [place] and nearby areas Hours:  Saturday Closed Sunday Closed Monday 8AM-5PM Tuesday 8AM-5PM Wednesday 8AM-5PM Thursday 8AM-5PM Friday 8AM-5PM  Phone: [phone]   Follow-Up: At [facility], you and your health needs are our priority.  As part of our continuing mission to provide you with exceptional heart care, we have created designated Provider Care Teams.  These Care Teams include your primary Cardiologist (physician) and Advanced Practice Providers (APPs -  Physician Assistants and Nurse Practitioners) who all work together to provide you with the care you need, when you need it.  We recommend signing up for the patient portal called "MyChart".  Sign up information is provided on this After Visit Summary.  MyChart is used to connect with patients for Virtual Visits (Telemedicine).  Patients are able to view lab/test results, encounter notes, upcoming  appointments, etc.  Non-urgent messages can be sent to your provider as well.   To learn more about what you can do with MyChart, go to [email or web address].    Your next appointment:    REFFERAL TO GASTROENTEROLOGIST   2 week(s)  The format for your next appointment:   In Person  Provider:   Gwyndolyn Kaufman, MD   Other Instructions

## 2020-08-27 ENCOUNTER — Other Ambulatory Visit: Payer: Self-pay

## 2020-08-27 NOTE — Patient Outreach (Signed)
[facility]) Care Management  08/27/2020  Patricia Garcia 1932-10-04 [id number]   Late entry for 08/24/20 [facility] outreach to complex care patient  Referral Date: 05/11/20 Referral Source: Natividad Brood, [facility] RN CM hospital liaison Referral Reason: Please assign to [place] Coordinator for complex care PCP does the [facility] follow up and disease management follow up calls and assess for further needs   Insurance: NextGen Medicare and blue cross and blue shield supplement   Last admission 05/06/20 to 05/10/20 Non ST elevation myocardial infarction (NSTEMI), CHF  05/02/20 to 05/04/20 right femoral fracture s/p THA by Dr. Lyla Glassing d/c    Patricia Garcia confirmed her 08/24/20 cardiology visit  She is able to review all changes by her provider She voices appreciation for adjustments of her medications- changed to plavix (was on Brilinta)  She discussed increased depression symptoms today, primarily lack of sleep related to concern with use of brilinita She was tearful during this outreach  "I cry a lot these days" She reports she will follow up on her cardiologist recommendation to speak with her primary care provider (PCP) about these increased symptoms She voices also she is hopeful to be able to get more sleep since Brilinta discontinued Depression screen [facility] 2/9 08/27/2020  Decreased Interest 1  Down, Depressed, Hopeless 1  PHQ - 2 Score 2  Altered sleeping 2  Tired, decreased energy 1  Change in appetite 1  Feeling bad or failure about yourself  0  Trouble concentrating 0  Moving slowly or fidgety/restless 0  Suicidal thoughts 0  PHQ-9 Score 6  Difficult doing work/chores Somewhat difficult     She was able to attend a outpatient therapy session for her hip. She did well and the therapist was able to discharge her with home exercises to complete with bands She will start cardiac rehab sessions now and is looking forward   Plans follow up within 30  days  Patricia Garcia L. Lavina Hamman, RN, BSN, [place] Coordinator Office number [phone] Main [facility] number [phone] Fax number [phone]

## 2020-08-30 ENCOUNTER — Ambulatory Visit
Admission: RE | Admit: 2020-08-30 | Discharge: 2020-08-30 | Disposition: A | Discharge status: Home / Self Care | Payer: Medicare Other | Source: Ambulatory Visit | Attending: Cardiology | Admitting: Cardiology

## 2020-08-30 ENCOUNTER — Other Ambulatory Visit: Payer: Self-pay

## 2020-08-30 DIAGNOSIS — R918 Other nonspecific abnormal finding of lung field: Secondary | ICD-10-CM | POA: Diagnosis not present

## 2020-08-30 DIAGNOSIS — J90 Pleural effusion, not elsewhere classified: Secondary | ICD-10-CM

## 2020-09-04 DIAGNOSIS — Z0000 Encounter for general adult medical examination without abnormal findings: Secondary | ICD-10-CM | POA: Diagnosis not present

## 2020-09-04 DIAGNOSIS — E7800 Pure hypercholesterolemia, unspecified: Secondary | ICD-10-CM | POA: Diagnosis not present

## 2020-09-04 DIAGNOSIS — Z1389 Encounter for screening for other disorder: Secondary | ICD-10-CM | POA: Diagnosis not present

## 2020-09-04 DIAGNOSIS — I700 Atherosclerosis of aorta: Secondary | ICD-10-CM | POA: Diagnosis not present

## 2020-09-04 DIAGNOSIS — I255 Ischemic cardiomyopathy: Secondary | ICD-10-CM | POA: Diagnosis not present

## 2020-09-04 DIAGNOSIS — F320 Major depressive disorder, single episode, mild: Secondary | ICD-10-CM | POA: Diagnosis not present

## 2020-09-04 DIAGNOSIS — F411 Generalized anxiety disorder: Secondary | ICD-10-CM | POA: Diagnosis not present

## 2020-09-04 DIAGNOSIS — I129 Hypertensive chronic kidney disease with stage 1 through stage 4 chronic kidney disease, or unspecified chronic kidney disease: Secondary | ICD-10-CM | POA: Diagnosis not present

## 2020-09-04 DIAGNOSIS — N1831 Chronic kidney disease, stage 3a: Secondary | ICD-10-CM | POA: Diagnosis not present

## 2020-09-06 ENCOUNTER — Telehealth: Payer: Self-pay | Admitting: Cardiology

## 2020-09-06 NOTE — Telephone Encounter (Signed)
Pt advised and will keep her appt 09/10/20.

## 2020-09-06 NOTE — Telephone Encounter (Signed)
Called the pt re: this message:  Patricia Garcia is calling stating this medication is too strong for her. She states taking 1 tablet daily on 09/04/20 caused her to sleep all day. Yesterday she tried to take a half a tablet in the morning and then half a tablet at night and she was still lethargic and it caused her to have crying episodes. She states she has not taken anything today due to this and needs something else. She was crying while on the phone and is very upset by this stating she cannot live like this. Her son had to come stay with her last night due to her being so upset. She states her BP got up to 155/98 at 5:30 AM due to being so upset. Please advise.    She tried cutting in half twice day and it did not help.... she held it 3 days last week and felt really good... she started taking it again and she was so lethargic that she was very emotional. I asked her to try and relax and she has not taken it today... her BP is 155/98.   She was so tearful on the phone I suggested that she call her PCP Dr. Felipa Eth and  She saw him 2 days ago and he was not sure it was the Losartan making her feel bad.... and he did not want to make any changes until she spoke with DR. Pemberton. Will forward to her.   She is asking that we call her this morning so she can get out of the house this afternoon and hoping that will make her feel better.

## 2020-09-06 NOTE — Addendum Note (Signed)
Addended by: Stephani Police on: 09/06/2020 01:58 PM   Modules accepted: Orders

## 2020-09-06 NOTE — Telephone Encounter (Signed)
Pt c/o medication issue:  1. Name of Medication: losartan (COZAAR) 50 MG tablet  2. How are you currently taking this medication (dosage and times per day)? Half a tablet morning and night  3. Are you having a reaction (difficulty breathing--STAT)? Yes   4. What is your medication issue? Patricia Garcia is calling stating this medication is too strong for her. She states taking 1 tablet daily on 09/04/20 caused her to sleep all day. Yesterday she tried to take a half a tablet in the morning and then half a tablet at night and she was still lethargic and it caused her to have crying episodes. She states she has not taken anything today due to this and needs something else. She was crying while on the phone and is very upset by this stating she cannot live like this. Her son had to come stay with her last night due to her being so upset. She states her BP got up to 155/98 at 5:30 AM due to being so upset. Please advise.

## 2020-09-06 NOTE — Telephone Encounter (Signed)
She can stop the losartan. I do not think that her symptoms are all due to the medication unfortunately. I agree that following up with her PCP is important. If she thinks the medication is contributing to her symptoms, she can just not take it. I am worried that depression is also making things harder for her. We will see her back shortly.  Gwyndolyn Kaufman, MD

## 2020-09-07 ENCOUNTER — Ambulatory Visit: Payer: Medicare Other | Admitting: Gastroenterology

## 2020-09-09 NOTE — Progress Notes (Signed)
Cardiology Office Note:    Date:  09/10/2020   ID:  Patricia Garcia, DOB 1932-12-31, MRN [id number]  PCP:  Lajean Manes, MD   [place]  Cardiologist:  Freada Bergeron, MD  Advanced Practice Provider:  No care team member to display Electrophysiologist:  None   Referring MD: Lajean Manes, MD     History of Present Illness:    Patricia Garcia is a 87 y.o. female with a hx of of LBBB, hypertension, diverticulosis, arthritis, depression,rightfemur fractures/p THA10/13/2021, depression, osteopenia, chronic diastolic CHF, CAD s/p recent NSTEMI who presents to clinic for follow-up.   The patient suffered a mechanical fall resulting in hip fracture requiring surgery on 05/02/20. She was readmitted to [facility] 10/17/21with nausea, vomiting, and hypoxia.She was ultimately found to have a NSTEMI (trops in 3k range)and acute on chronic diastolic heart failure. She was treated with IV Lasix. TTE 05/07/2020 showed hypokinesis of the basal inferior and inferolateral wall, EF 50 to 60%, grade 2 diastolic dysfunction, elevated left atrial pressure, mild mitral regurgitation. She underwent cardiac cath 05/08/2020 showing three-vessel obstructive coronary artery disease as outlined below ultimately received DES to mid RCA and first diagonal. Hospitalization was also notable for anemia requiring blood transfusion, hypokalemia, gait instability, hypokalemia, hypoalbuminemia, mild AKI, and nocturnal confusion.  During our last visit on 08/24/20, the patient was very distressed as she had been feeling unwell for several weeks. Specifically, she stated that she had been feeling more fatigued, nervous, depressed and listless. She had to lay down after minimal activity. Noted blood in her stool about 4 days. No black or tarry stool. Had been dizzy in with position changes and rolling over in bed. No orthopnea, PND, LE edema. During that visit, we stopped her  clonidine, changed ticagrelor to plavix, started losartan and checked CBC and CXR which were normal. She has since stopped her losartan as she was concerned about side-effects of feeling too tired and causing he to have crying episodes. She now returns to clinic for follow-up.  Today, the patient states that she feels overall better since stopping the lisinopril. Had a breakdown several days ago as it was her husband's birthday and she felt overwhelmed and sad. Otherwise, shortness of breath is improved but still gets winded with stairs. No further bleeding in the stool; has not follow-up with GI. No further lightheadedness. No chest pain. Continues to have decreased appetite, but sleeping better. Has started on boost. Hoping to start cardiac rehab.  Past Medical History:  Diagnosis Date  . Anal or rectal pain   . Anemia   . Arthritis   . CAD (coronary artery disease)    a. NSTEMI 04/2020 s/p DES to [facility] and D1.  . Chronic diastolic CHF (congestive heart failure) ([place])   . Depression   . Diverticulosis of colon (without mention of hemorrhage)   . Hypertension   . Hypoalbuminemia   . Internal hemorrhoids without mention of complication   . Ischemic heart disease   . LBBB (left bundle branch block)   . Osteopenia   . Renal insufficiency   . S/P right hip fracture   . Slow transit constipation     Past Surgical History:  Procedure Laterality Date  . ABDOMINAL HYSTERECTOMY    . CATARACT EXTRACTION, BILATERAL    . CORONARY STENT INTERVENTION N/A 05/08/2020   Procedure: CORONARY STENT INTERVENTION;  Surgeon: Martinique, Peter M, MD;  Location: [place] [facility];  Service: Cardiovascular;  Laterality: N/A;  .  LEFT HEART CATH AND CORONARY ANGIOGRAPHY N/A 05/08/2020   Procedure: LEFT HEART CATH AND CORONARY ANGIOGRAPHY;  Surgeon: Martinique, Peter M, MD;  Location: [place] [facility];  Service: Cardiovascular;  Laterality: N/A;  . MASS EXCISION  06/10/2011   Procedure: EXCISION MASS;  Surgeon: Wynonia Sours, MD;  Location: [place];  Service: Orthopedics;  Laterality: Right;  excision cyst right thumb IP, debridement IP right thumb  . TONSILLECTOMY    . TOTAL HIP ARTHROPLASTY Right 05/02/2020   Procedure: TOTAL HIP ARTHROPLASTY ANTERIOR APPROACH;  Surgeon: Rod Can, MD;  Location: [facility];  Service: Orthopedics;  Laterality: Right;    Current Medications: Current Meds  Medication Sig  . aspirin 81 MG chewable tablet aspirin 81 mg chewable tablet  . Calcium Carbonate-Vit D-Min (CALCIUM 1200 PO) Take 1,200 mg by mouth daily.  . cholecalciferol (VITAMIN D) 1000 UNITS tablet Take 1,000 Units by mouth daily.  . clopidogrel (PLAVIX) 75 MG tablet Take 1 tablet (75 mg total) by mouth daily.  Marland Kitchen docusate sodium (COLACE) 100 MG capsule Take 100 mg by mouth 3 (three) times a week.  . esomeprazole (NEXIUM) 40 MG capsule Take 1 capsule (40 mg total) by mouth daily.  . ferrous sulfate 325 (65 FE) MG tablet Take 325 mg by mouth See admin instructions. 3 times a week  . latanoprost (XALATAN) 0.005 % ophthalmic solution Place 1 drop into both eyes at bedtime.  . multivitamin-iron-minerals-folic acid (CENTRUM) chewable tablet Chew 1 tablet by mouth daily.  . Naphazoline-Pheniramine (OPCON-A OP) Place 1 drop into both eyes daily as needed (for eye allergy).  . sodium chloride (MURO 128) 2 % ophthalmic solution Place 1 drop into both eyes 2 (two) times daily as needed for eye irritation.     Allergies:   Penicillins, Prednisone, Statins, Sulfonamide derivatives, and Tramadol   Social History   Socioeconomic History  . Marital status: Widowed    Spouse name: Not on file  . Number of children: 2  . Years of education: Not on file  . Highest education level: Not on file  Occupational History  . Occupation: Retired    Fish farm manager: RETIRED  Tobacco Use  . Smoking status: Former Smoker    Quit date: 06/05/1984    Years since quitting: 36.2  . Smokeless tobacco: Never Used   Substance and Sexual Activity  . Alcohol use: No  . Drug use: No  . Sexual activity: Not on file  Other Topics Concern  . 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 Worried About Charity fundraiser in the Last Year: Never true  . Ran Out of Food in the Last Year: Never true  Transportation Needs: No Transportation Needs  . Lack of Transportation (Medical): No  . Lack of Transportation (Non-Medical): No  Physical Activity: Not on file  Stress: Not on file  Social Connections: Moderately Integrated  . Frequency of Communication with Friends and Family: More than three times a week  . Frequency of Social Gatherings with Friends and Family: More than three times a week  . Attends Religious Services: 1 to 4 times per year  . Active Member of Clubs or Organizations: Yes  . Attends Archivist Meetings: 1 to  4 times per year  . Marital Status: Widowed     Family History: The patient's family history includes Cancer in her father; Colon polyps in her mother; Heart disease in her father.  ROS:   Please see the history of present illness.    Review of Systems  Constitutional: Positive for malaise/fatigue. Negative for chills and fever.  HENT: Negative for hearing loss.   Eyes: Negative for blurred vision.  Respiratory: Positive for shortness of breath.   Cardiovascular: Negative for chest pain, palpitations, orthopnea, claudication, leg swelling and PND.  Gastrointestinal: Negative for blood in stool, nausea and vomiting.  Genitourinary: Negative for dysuria and flank pain.  Musculoskeletal: Positive for joint pain. Negative for falls.  Neurological: Negative for dizziness and loss of consciousness.  Endo/Heme/Allergies: Negative for polydipsia.   Psychiatric/Behavioral: Positive for depression. Negative for substance abuse. The patient is nervous/anxious.     EKGs/Labs/Other Studies Reviewed:    The following studies were reviewed today: Cardiac Cath 05/08/20  1st Diag lesion is 85% stenosed.  Ramus lesion is 30% stenosed.  Prox Cx to Mid Cx lesion is 90% stenosed.  Prox RCA to Mid RCA lesion is 95% stenosed.  Post intervention, there is a 0% residual stenosis.  A drug-eluting stent was successfully placed using a STENT RESOLUTE ONYX 3.0X15.  Post intervention, there is a 0% residual stenosis.  A drug-eluting stent was successfully placed using a Tedrow 2.25X15.  LV end diastolic pressure is mildly elevated.  1. 3 vessel obstructive CAD - 95% mid RCA- this appeared to be the culprit lesion - 85% proxima first diagonal - 90% very small LCx in the AV groove 2. Mildly elevated LVEDP 3. Successful PCI of the mid RCA with DES x 1 4. Successful PCI of the first diagonal with DES x 1  Plan: DAPT for one year. Will transition lasix to po. Further plans per primary team.   Echo 05/07/20 IMPRESSIONS  1. Hypokinesis of the basal inferior and inferolateral wall with overall  preserved LV function.  2. Left ventricular ejection fraction, by estimation, is 50 to 55%. The  left ventricle has low normal function. The left ventricle demonstrates  regional wall motion abnormalities (see scoring diagram/findings for  description). Left ventricular diastolic  parameters are consistent with Grade II diastolic dysfunction  (pseudonormalization). Elevated left atrial pressure.  3. Right ventricular systolic function is normal. The right ventricular  size is normal. There is mildly elevated pulmonary artery systolic  pressure.  4. Moderate pleural effusion in the left lateral region.  5. The mitral valve is normal in structure. Mild mitral valve  regurgitation. No evidence of mitral stenosis.   6. The aortic valve is tricuspid. Aortic valve regurgitation is trivial.  No aortic stenosis is present.  7. The inferior vena cava is normal in size with greater than 50%  respiratory variability, suggesting right atrial pressure of 3 mmHg.   FINDINGS  Left Ventricle: Left ventricular ejection fraction, by estimation, is 50  to 55%. The left ventricle has low normal function. The left ventricle  demonstrates regional wall motion abnormalities. The left ventricular  internal cavity size was normal in  size. There is no left ventricular hypertrophy. Left ventricular diastolic  parameters are consistent with Grade II diastolic dysfunction  (pseudonormalization). Elevated left atrial pressure.   Right Ventricle: The right ventricular size is normal.Right ventricular  systolic function is normal. There is mildly elevated pulmonary artery  systolic pressure. The tricuspid regurgitant velocity is 3.08 m/s, and  with an assumed right atrial pressure of  3 mmHg, the estimated right ventricular systolic pressure is 93.2 mmHg.   Left Atrium: Left atrial size was normal in size.   Right Atrium: Right atrial size was normal in size.   Pericardium: There is no evidence of pericardial effusion.   Mitral Valve: The mitral valve is normal in structure. Mild mitral annular  calcification. Mild mitral valve regurgitation. No evidence of mitral  valve stenosis.   Tricuspid Valve: The tricuspid valve is normal in structure. Tricuspid  valve regurgitation is mild . No evidence of tricuspid stenosis.   Aortic Valve: The aortic valve is tricuspid. Aortic valve regurgitation is  trivial. No aortic stenosis is present.   Pulmonic Valve: The pulmonic valve was normal in structure. Pulmonic valve  regurgitation is trivial. No evidence of pulmonic stenosis.   Aorta: The aortic root is normal in size and structure.   Venous: The inferior vena cava is normal in size with greater than 50%   respiratory variability, suggesting right atrial pressure of 3 mmHg.     Recent Labs: 05/06/2020: B Natriuretic Peptide 1,221.6 05/10/2020: Magnesium 2.1 07/16/2020: ALT 14 08/24/2020: BUN 18; Creatinine, Ser 0.99; Hemoglobin 11.7; Platelets 349; Potassium 4.2; Sodium 138  Recent Lipid Panel    Component Value Date/Time   CHOL 257 (H) 07/16/2020 0904   TRIG 240 (H) 07/16/2020 0904   HDL 49 07/16/2020 0904   CHOLHDL 5.2 (H) 07/16/2020 0904   CHOLHDL 4.4 05/08/2020 0457   VLDL 25 05/08/2020 0457   LDLCALC 163 (H) 07/16/2020 0904     Risk Assessment/Calculations:       Physical Exam:    VS:  BP (!) 158/62   Pulse (!) 112   Ht 5\' 3"  (1.6 m)   Wt 123 lb 3.2 oz (55.9 kg)   SpO2 97%   BMI 21.82 kg/m     Wt Readings from Last 3 Encounters:  09/10/20 123 lb 3.2 oz (55.9 kg)  08/24/20 122 lb (55.3 kg)  08/01/20 121 lb (54.9 kg)     GEN:  Well nourished, well developed. Tearful during exam HEENT: Normal NECK: No JVD; No carotid bruits CARDIAC: RRR, no murmurs, rubs, gallops RESPIRATORY:  Clear to auscultation without rales, wheezing or rhonchi  ABDOMEN: Soft, non-tender, non-distended MUSCULOSKELETAL:  No edema; No deformity  SKIN: Warm and dry NEUROLOGIC:  Alert and oriented x 3 PSYCHIATRIC:  Tearful but pleasant  ASSESSMENT:    1. Chronic diastolic CHF (congestive heart failure) ([place])   2. Non-ST elevation myocardial infarction (NSTEMI), subsequent episode of care ([place])   3. Hyperlipidemia LDL goal <70   4. Essential hypertension   5. CAD in native artery   6. Anemia, unspecified type    PLAN:    In order of problems listed above:  #NSTEMI: #Multivessel CAD: Patient with recent hospitalization in 04/2020 for NSTEMI found to have multivessel CAD s/p successful PCI to RCA and D1 on 05/08/20. She was doing well initially but now with increasing fatigue, DOE, and decreased energy. Also with new blood in her stool over the past week. Patient was very concerned  this was all related to her ticagrelor which has subsequently been changed to plavix. Notably she stopped her metoprolol and zetia as she felt like they made her feel poorly. -TTE withLVEF of 50-55% with hypokinesis of the basal inferior and inferolateral wall, grade 2 diastolic dysfunction, moderate left pleural effusion, and mildly elevated PASP -Changed ticagrelor to plavix due to BRBPR; tolerating plavix without  issue -Continue ASA 81mg  daily -Patient unwilling to resume BB, losartan or zetia at this time due to reported side-effects--will attempt to add additional blood pressure medications in the future once more stabilized and feels more back to herself -Start cardiac rehab  #Chronic Diastolic CHF: Appears euvolemic today. Not on diuretic therapy -Stopped metop due to side-effects -Stopped losartan 50mg  daily due to reported side effects -Will try to optimize medications once she feels better; given frequent symptoms with medications, will hold off on adding anything additional today until more stabilized -Low Na diet -Daily weights  #Blood in Stool: Resolved without evidence of re-bleed. Like hemorrhoidal. HgB normal. Recommended GI follow-up but has not occurred. -Continue iron supplementation -Changed famotidine to nexium -Needs to continue uninterrupted DAPT due to recent PCI  #Hypertension: - Stopped clonidine as likely causing blood pressure swings -Patient stopped metop as above due to side effects - Patient stopped losartan due to reported side effects - While blood pressure is elevated; she has not done well with any medication additions at this time and so will tolerate elevated blood pressures until she is more clinically stabilized  #Fatigue: Improving. Suspect was secondary to blood pressure effects as well as depression.  -Start cardiac rehab -Continue follow-up with PCP for depression  #Vertigo: Resolved. -Follow-up with PCP as  scheduled  #Depression: -Follow-up with PCP    Medication Adjustments/Labs and Tests Ordered: Current medicines are reviewed at length with the patient today.  Concerns regarding medicines are outlined above.  No orders of the defined types were placed in this encounter.  No orders of the defined types were placed in this encounter.   Patient Instructions  Medication Instructions:  Your physician recommends that you continue on your current medications as directed. Please refer to the Current Medication list given to you today.   *If you need a refill on your cardiac medications before your next appointment, please call your pharmacy*   Lab Work: None ordered  If you have labs (blood work) drawn today and your tests are completely normal, you will receive your results only by: Marland Kitchen MyChart Message (if you have MyChart) OR . A paper copy in the mail If you have any lab test that is abnormal or we need to change your treatment, we will call you to review the results.   Testing/Procedures: None ordered   Follow-Up: At [facility], you and your health needs are our priority.  As part of our continuing mission to provide you with exceptional heart care, we have created designated Provider Care Teams.  These Care Teams include your primary Cardiologist (physician) and Advanced Practice Providers (APPs -  Physician Assistants and Nurse Practitioners) who all work together to provide you with the care you need, when you need it.  We recommend signing up for the patient portal called "MyChart".  Sign up information is provided on this After Visit Summary.  MyChart is used to connect with patients for Virtual Visits (Telemedicine).  Patients are able to view lab/test results, encounter notes, upcoming appointments, etc.  Non-urgent messages can be sent to your provider as well.   To learn more about what you can do with MyChart, go to [email or web address].    Your next appointment:    1 MONTH  The format for your next appointment:   In Person  Provider:   Gwyndolyn Kaufman, MD   Other Instructions     Signed, Freada Bergeron, MD  09/10/2020 3:49 PM    [place]

## 2020-09-10 ENCOUNTER — Other Ambulatory Visit: Payer: Self-pay

## 2020-09-10 ENCOUNTER — Encounter: Payer: Self-pay | Admitting: Cardiology

## 2020-09-10 ENCOUNTER — Ambulatory Visit (INDEPENDENT_AMBULATORY_CARE_PROVIDER_SITE_OTHER): Payer: Medicare Other | Admitting: Cardiology

## 2020-09-10 VITALS — BP 158/62 | HR 112 | Ht 63.0 in | Wt 123.2 lb

## 2020-09-10 DIAGNOSIS — D649 Anemia, unspecified: Secondary | ICD-10-CM | POA: Diagnosis not present

## 2020-09-10 DIAGNOSIS — E785 Hyperlipidemia, unspecified: Secondary | ICD-10-CM | POA: Diagnosis not present

## 2020-09-10 DIAGNOSIS — I5032 Chronic diastolic (congestive) heart failure: Secondary | ICD-10-CM

## 2020-09-10 DIAGNOSIS — I2510 Atherosclerotic heart disease of native coronary artery without angina pectoris: Secondary | ICD-10-CM

## 2020-09-10 DIAGNOSIS — I10 Essential (primary) hypertension: Secondary | ICD-10-CM | POA: Diagnosis not present

## 2020-09-10 DIAGNOSIS — I214 Non-ST elevation (NSTEMI) myocardial infarction: Secondary | ICD-10-CM | POA: Diagnosis not present

## 2020-09-10 NOTE — Patient Instructions (Signed)
Medication Instructions:  Your physician recommends that you continue on your current medications as directed. Please refer to the Current Medication list given to you today.   *If you need a refill on your cardiac medications before your next appointment, please call your pharmacy*   Lab Work: None ordered  If you have labs (blood work) drawn today and your tests are completely normal, you will receive your results only by: Marland Kitchen MyChart Message (if you have MyChart) OR . A paper copy in the mail If you have any lab test that is abnormal or we need to change your treatment, we will call you to review the results.   Testing/Procedures: None ordered   Follow-Up: At [facility], you and your health needs are our priority.  As part of our continuing mission to provide you with exceptional heart care, we have created designated Provider Care Teams.  These Care Teams include your primary Cardiologist (physician) and Advanced Practice Providers (APPs -  Physician Assistants and Nurse Practitioners) who all work together to provide you with the care you need, when you need it.  We recommend signing up for the patient portal called "MyChart".  Sign up information is provided on this After Visit Summary.  MyChart is used to connect with patients for Virtual Visits (Telemedicine).  Patients are able to view lab/test results, encounter notes, upcoming appointments, etc.  Non-urgent messages can be sent to your provider as well.   To learn more about what you can do with MyChart, go to [email or web address].    Your next appointment:   1 MONTH  The format for your next appointment:   In Person  Provider:   Gwyndolyn Kaufman, MD   Other Instructions

## 2020-09-21 ENCOUNTER — Telehealth (HOSPITAL_COMMUNITY): Payer: Self-pay

## 2020-09-21 NOTE — Telephone Encounter (Signed)
Cardiac Rehab Medication Review by a Pharmacist  Does the patient  feel that his/her medications are working for him/her?  yes  Has the patient been experiencing any side effects to the medications prescribed?  no  Does the patient measure his/her own blood pressure or blood glucose at home?  No, her cardiologist had her on a blood pressure medication but patient was not able to tolerate it.  Advised her that she did not need to check her pressures on a regular basis.  MD going to reassess at her next visit on 10/10/20.   Does the patient have any problems obtaining medications due to transportation or finances?   no  Understanding of regimen: excellent Understanding of indications: excellent Potential of compliance: excellent    Pharmacist Intervention: n/a   Dimple Nanas 09/21/2020 2:41 PM

## 2020-09-24 ENCOUNTER — Telehealth: Payer: Self-pay | Admitting: Cardiology

## 2020-09-24 NOTE — Telephone Encounter (Signed)
Returned call to patient who called to seek advice regarding taking one-half alprazolam 0.25 mg tablet for anxiety. Dr. Felipa Eth gave her this Rx and she rarely takes it. She is upset about her son being in the hospital with either a stroke or MI. She states she knows her BP is high but she is not going to check it because she knows it will only upset her further. She states she is more calm now than when she called earlier and she wants Dr. Johney Frame to know that she is not crying. She states she will likely take 1/2 of an alprazolam 0.25 mg and she will also do things to help her relax. She thanked me for the call.

## 2020-09-24 NOTE — Telephone Encounter (Signed)
New Message:     Pt said she received shocking news this morning that had son had a stroke.  She have some Alprazolam .25 mg that she takes a half sometimes when she needs it. She wants to know if it will be alright to take a half one today. She says she is trying to stay calm.

## 2020-09-25 ENCOUNTER — Other Ambulatory Visit: Payer: Self-pay

## 2020-09-25 ENCOUNTER — Encounter: Payer: Self-pay | Admitting: Gastroenterology

## 2020-09-25 ENCOUNTER — Ambulatory Visit (HOSPITAL_COMMUNITY)
Admission: RE | Admit: 2020-09-25 | Discharge: 2020-09-25 | Disposition: A | Discharge status: Home / Self Care | Payer: Medicare Other | Source: Ambulatory Visit | Attending: Cardiology | Admitting: Cardiology

## 2020-09-25 ENCOUNTER — Encounter (HOSPITAL_COMMUNITY)
Admission: RE | Admit: 2020-09-25 | Discharge: 2020-09-25 | Disposition: A | Discharge status: Home / Self Care | Payer: Medicare Other | Source: Ambulatory Visit | Attending: Cardiology | Admitting: Cardiology

## 2020-09-25 VITALS — BP 108/60 | HR 105 | Ht 61.0 in | Wt 123.9 lb

## 2020-09-25 DIAGNOSIS — I214 Non-ST elevation (NSTEMI) myocardial infarction: Secondary | ICD-10-CM | POA: Insufficient documentation

## 2020-09-25 DIAGNOSIS — Z955 Presence of coronary angioplasty implant and graft: Secondary | ICD-10-CM | POA: Diagnosis not present

## 2020-09-25 DIAGNOSIS — I440 Atrioventricular block, first degree: Secondary | ICD-10-CM | POA: Diagnosis not present

## 2020-09-25 DIAGNOSIS — I447 Left bundle-branch block, unspecified: Secondary | ICD-10-CM | POA: Insufficient documentation

## 2020-09-25 NOTE — Progress Notes (Signed)
   Contacted by Verdis Frederickson at cardiac rehab. Pt HR elevated, RN not sure this is SR.  ECG obtained and reviewed. ST, HR 103, 1st degree AVB and LBBB are old.  Pt asymptomatic.  Patricia Garcia comments that P waves are hard to see, even when gain increased.  Encouraged her to go ahead and walk pt, pay attention to symptoms and do not let HR get > 100% (135).   If pt becomes symptomatic, stop.  Continue to monitor.  Not able to start BB due to previous side effects w/ metoprolol.  F/u with Dr Johney Frame as scheduled.  Rosaria Ferries, PA-C 09/25/2020 2:42 PM

## 2020-09-25 NOTE — Progress Notes (Signed)
Patient here for cardiac rehab orientation. Patient parked in the [place] tower and walked to the [place] side of the hospital. Patient's son is in the hospital after having a stroke on [address]. BP 108/60 heart rate 110. Oxygen saturation 98% on room air. Patient placed on telemetry monitor. Unable to visualize a P wave. Patient generally asymptomatic besides being a little upset that her son is in the hospital. Rosaria Ferries [facility] paged and notified. Rhonda order 12 lead ECG. 12 lead obtained.Barnet Pall, RN,BSN 09/25/2020 1:55 PM

## 2020-09-26 ENCOUNTER — Encounter (HOSPITAL_COMMUNITY): Payer: Self-pay

## 2020-09-26 ENCOUNTER — Other Ambulatory Visit: Payer: Self-pay | Admitting: *Deleted

## 2020-09-26 DIAGNOSIS — E7800 Pure hypercholesterolemia, unspecified: Secondary | ICD-10-CM | POA: Insufficient documentation

## 2020-09-26 DIAGNOSIS — F320 Major depressive disorder, single episode, mild: Secondary | ICD-10-CM | POA: Insufficient documentation

## 2020-09-26 NOTE — Progress Notes (Signed)
Cardiac Individual Treatment Plan  Patient Details  Name: PAMELLA SAMONS MRN: [id number] Date of Birth: [birth year] Referring Provider:   Flowsheet Row CARDIAC REHAB PHASE II ORIENTATION from 09/25/2020 in [place]  Referring Provider Freada Bergeron, MD       Initial Encounter Date:  [place] PHASE II ORIENTATION from 09/25/2020 in [place]  Date 09/25/20       Visit Diagnosis: NSTEMI 05/06/20  05/08/20 S/P DES RCA and Diagonal  Patient's Home Medications on Admission:  Current Outpatient Medications:  .  aspirin 81 MG chewable tablet, aspirin 81 mg chewable tablet, Disp: , Rfl:  .  Calcium Carbonate-Vit D-Min (CALCIUM 1200 PO), Take 1,200 mg by mouth daily., Disp: , Rfl:  .  cholecalciferol (VITAMIN D) 1000 UNITS tablet, Take 1,000 Units by mouth daily., Disp: , Rfl:  .  clopidogrel (PLAVIX) 75 MG tablet, Take 1 tablet (75 mg total) by mouth daily., Disp: 90 tablet, Rfl: 3 .  docusate sodium (COLACE) 100 MG capsule, Take 100 mg by mouth 3 (three) times a week., Disp: , Rfl:  .  esomeprazole (NEXIUM) 40 MG capsule, Take 1 capsule (40 mg total) by mouth daily., Disp: 90 capsule, Rfl: 1 .  ferrous sulfate 325 (65 FE) MG tablet, Take 325 mg by mouth See admin instructions. 3 times a week, Disp: , Rfl:  .  latanoprost (XALATAN) 0.005 % ophthalmic solution, Place 1 drop into both eyes at bedtime., Disp: , Rfl:  .  multivitamin-iron-minerals-folic acid (CENTRUM) chewable tablet, Chew 1 tablet by mouth daily., Disp: , Rfl:  .  Naphazoline-Pheniramine (OPCON-A OP), Place 1 drop into both eyes daily as needed (for eye allergy)., Disp: , Rfl:  .  Omega-3 Fatty Acids (FISH OIL) 1000 MG CAPS, Take 1,000 mg by mouth daily., Disp: , Rfl:  .  sodium chloride (MURO 128) 2 % ophthalmic solution, Place 1 drop into both eyes 2 (two) times daily as needed for eye irritation., Disp: , Rfl:   Past Medical  History: Past Medical History:  Diagnosis Date  . Anal or rectal pain   . Anemia   . Arthritis   . CAD (coronary artery disease)    a. NSTEMI 04/2020 s/p DES to [facility] and D1.  . Chronic diastolic CHF (congestive heart failure) ([place])   . Depression   . Diverticulosis of colon (without mention of hemorrhage)   . Hypertension   . Hypoalbuminemia   . Internal hemorrhoids without mention of complication   . Ischemic heart disease   . LBBB (left bundle branch block)   . Osteopenia   . Renal insufficiency   . S/P right hip fracture   . Slow transit constipation     Tobacco Use: Social History   Tobacco Use  Smoking Status Former Smoker  . Quit date: 06/05/1984  . Years since quitting: 36.3  Smokeless Tobacco Never Used    Labs: Recent Chemical engineer     Labs for ITP Cardiac and Pulmonary Rehab Latest Ref Rng & Units 05/08/2020 07/16/2020   Cholestrol 100 - 199 mg/dL 166 257(H)   LDLCALC 0 - 99 mg/dL 103(H) 163(H)   HDL >39 mg/dL 38(L) 49   Trlycerides 0 - 149 mg/dL 126 240(H)   Hemoglobin A1c 4.8 - 5.6 % 5.7(H) -       Capillary Blood Glucose: No results found for: GLUCAP    Exercise Target Goals: Exercise Program Goal: Individual exercise prescription set  using results from initial 6 min walk test and THRR while considering  patient's activity barriers and safety.   Exercise Prescription Goal: Starting with aerobic activity 30 plus minutes a day, 3 days per week for initial exercise prescription. Provide home exercise prescription and guidelines that participant acknowledges understanding prior to discharge.  Activity Barriers & Risk Stratification:  Activity Barriers & Cardiac Risk Stratification - 09/25/20 1424       Activity Barriers & Cardiac Risk Stratification   Activity Barriers History of Falls;Right Hip Replacement;Other (comment);Arthritis    Comments Fall with rigth hip fracture, s/p hip replacement. Vertigo.    Cardiac Risk Stratification  High             6 Minute Walk:  6 Minute Walk     Row Name 09/25/20 1533         6 Minute Walk   Phase Initial     Distance 1075 feet     Walk Time 6 minutes     # of Rest Breaks 0     MPH 2.03     METS 2.43     RPE 11     Perceived Dyspnea  1     VO2 Peak 8.52     Symptoms Yes (comment)     Comments Patient c/o mild shortness of breath.     Resting HR 94 bpm     Resting BP 108/60     Resting Oxygen Saturation  98 %     Exercise Oxygen Saturation  during 6 min walk 98 %     Max Ex. HR 139 bpm     Max Ex. BP 159/85     2 Minute Post BP 118/82              Oxygen Initial Assessment:    Oxygen Re-Evaluation:    Oxygen Discharge (Final Oxygen Re-Evaluation):    Initial Exercise Prescription:  Initial Exercise Prescription - 09/25/20 1600       Date of Initial Exercise RX and Referring Provider   Date 09/25/20    Referring Provider Freada Bergeron, MD    Expected Discharge Date 11/23/20      NuStep   Level 2    SPM 85    Minutes 15    METs 2.2      Track   Laps 15    Minutes 15    METs 2.74      Prescription Details   Frequency (times per week) 2    Duration Progress to 30 minutes of continuous aerobic without signs/symptoms of physical distress      Intensity   THRR 40-80% of Max Heartrate 54-135    Ratings of Perceived Exertion 11-13    Perceived Dyspnea 0-4      Progression   Progression Continue to progress workloads to maintain intensity without signs/symptoms of physical distress.      Resistance Training   Training Prescription Yes    Weight 2 lbs    Reps 10-15             Perform Capillary Blood Glucose checks as needed.  Exercise Prescription Changes:    Exercise Comments:    Exercise Goals and Review:  Exercise Goals     Row Name 09/25/20 1424             Exercise Goals   Increase Physical Activity Yes       Intervention Provide advice, education, support and counseling about physical  activity/exercise  needs.;Develop an individualized exercise prescription for aerobic and resistive training based on initial evaluation findings, risk stratification, comorbidities and participant's personal goals.       Expected Outcomes Short Term: Attend rehab on a regular basis to increase amount of physical activity.;Long Term: Exercising regularly at least 3-5 days a week.;Long Term: Add in home exercise to make exercise part of routine and to increase amount of physical activity.       Increase Strength and Stamina Yes       Intervention Provide advice, education, support and counseling about physical activity/exercise needs.;Develop an individualized exercise prescription for aerobic and resistive training based on initial evaluation findings, risk stratification, comorbidities and participant's personal goals.       Expected Outcomes Short Term: Increase workloads from initial exercise prescription for resistance, speed, and METs.;Short Term: Perform resistance training exercises routinely during rehab and add in resistance training at home;Long Term: Improve cardiorespiratory fitness, muscular endurance and strength as measured by increased METs and functional capacity (6MWT)       Able to understand and use rate of perceived exertion (RPE) scale Yes       Intervention Provide education and explanation on how to use RPE scale       Expected Outcomes Short Term: Able to use RPE daily in rehab to express subjective intensity level;Long Term:  Able to use RPE to guide intensity level when exercising independently       Knowledge and understanding of Target Heart Rate Range (THRR) Yes       Intervention Provide education and explanation of THRR including how the numbers were predicted and where they are located for reference       Expected Outcomes Short Term: Able to state/look up THRR;Long Term: Able to use THRR to govern intensity when exercising independently;Short Term: Able to use daily as  guideline for intensity in rehab       Able to check pulse independently Yes       Intervention Provide education and demonstration on how to check pulse in carotid and radial arteries.;Review the importance of being able to check your own pulse for safety during independent exercise       Expected Outcomes Short Term: Able to explain why pulse checking is important during independent exercise;Long Term: Able to check pulse independently and accurately       Understanding of Exercise Prescription Yes       Intervention Provide education, explanation, and written materials on patient's individual exercise prescription       Expected Outcomes Short Term: Able to explain program exercise prescription;Long Term: Able to explain home exercise prescription to exercise independently                Exercise Goals Re-Evaluation :     Discharge Exercise Prescription (Final Exercise Prescription Changes):    Nutrition:  Target Goals: Understanding of nutrition guidelines, daily intake of sodium 1500mg , cholesterol 200mg , calories 30% from fat and 7% or less from saturated fats, daily to have 5 or more servings of fruits and vegetables.  Biometrics:  Pre Biometrics - 09/25/20 1330       Pre Biometrics   Waist Circumference 32 inches    Hip Circumference 39 inches    Waist to Hip Ratio 0.82 %    Triceps Skinfold 22 mm    % Body Fat 36.1 %    Grip Strength 23 kg    Flexibility --   Not performed, right hip repalcement.   Single  Leg Stand 15.5 seconds               Nutrition Therapy Plan and Nutrition Goals:    Nutrition Assessments:   MEDIFICTS Score Key:  ?70 Need to make dietary changes   40-70 Heart Healthy Diet  ? 40 Therapeutic Level Cholesterol Diet    Picture Your Plate Scores:  <28 Unhealthy dietary pattern with much room for improvement.  41-50 Dietary pattern unlikely to meet recommendations for good health and room for improvement.  51-60 More healthful  dietary pattern, with some room for improvement.   >60 Healthy dietary pattern, although there may be some specific behaviors that could be improved.    Nutrition Goals Re-Evaluation:    Nutrition Goals Discharge (Final Nutrition Goals Re-Evaluation):    Psychosocial: Target Goals: Acknowledge presence or absence of significant depression and/or stress, maximize coping skills, provide positive support system. Participant is able to verbalize types and ability to use techniques and skills needed for reducing stress and depression.  Initial Review & Psychosocial Screening:  Initial Psych Review & Screening - 09/26/20 0835       Initial Review   Current issues with History of Depression      Family Dynamics   Good Support System? Yes   Miria lives alone. Oneda has her Son, Daughter in [place] and signigant other for support     Barriers   Psychosocial barriers to participate in program There are no identifiable barriers or psychosocial needs.      Screening Interventions   Interventions Encouraged to exercise             Quality of Life Scores:  Quality of Life - 09/25/20 1522       Quality of Life   Select Quality of Life      Quality of Life Scores   Health/Function Pre 26.31 %    Socioeconomic Pre 27.75 %    Psych/Spiritual Pre 28.07 %    Family Pre 27 %    GLOBAL Pre 27.1 %            Scores of 19 and below usually indicate a poorer quality of life in these areas.  A difference of  2-3 points is a clinically meaningful difference.  A difference of 2-3 points in the total score of the Quality of Life Index has been associated with significant improvement in overall quality of life, self-image, physical symptoms, and general health in studies assessing change in quality of life.  PHQ-9: Recent Review Flowsheet Data     Depression screen [facility] 2/9 09/26/2020 08/27/2020 08/06/2020 07/02/2020 06/01/2020   Decreased Interest 0 1 1 1  0   Down, Depressed, Hopeless 0 1  1 1  0   PHQ - 2 Score 0 2 2 2  0   Altered sleeping - 2 1 1  -   Tired, decreased energy - 1 1 1  -   Change in appetite - 1 1 0 -   Feeling bad or failure about yourself  - 0 0 0 -   Trouble concentrating - 0 0 - -   Moving slowly or fidgety/restless - 0 0 0 -   Suicidal thoughts - 0 0 0 -   PHQ-9 Score - 6 5 4  -   Difficult doing work/chores - Somewhat difficult Somewhat difficult Somewhat difficult -      Interpretation of Total Score  Total Score Depression Severity:  1-4 = Minimal depression, 5-9 = Mild depression, 10-14 = Moderate depression, 15-19 =  Moderately severe depression, 20-27 = Severe depression   Psychosocial Evaluation and Intervention:    Psychosocial Re-Evaluation:    Psychosocial Discharge (Final Psychosocial Re-Evaluation):    Vocational Rehabilitation: Provide vocational rehab assistance to qualifying candidates.   Vocational Rehab Evaluation & Intervention:  Vocational Rehab - 09/26/20 1035       Initial Vocational Rehab Evaluation & Intervention   Assessment shows need for Vocational Rehabilitation No   Carrin is retired and does not need vocational rehab at this time            Education: Education Goals: Education classes will be provided on a weekly basis, covering required topics. Participant will state understanding/return demonstration of topics presented.  Learning Barriers/Preferences:  Learning Barriers/Preferences - 09/26/20 0839       Learning Barriers/Preferences   Learning Barriers Inability to learn new things;Sight;Exercise Concerns;Hearing   Wears hearing aides, glasses dizziness memory deficit   Learning Preferences Individual Instruction;Video;Skilled Demonstration             Education Topics: Hypertension, Hypertension Reduction -Define heart disease and high blood pressure. Discus how high blood pressure affects the body and ways to reduce high blood pressure.    Exercise and Your Heart -Discuss why it is  important to exercise, the FITT principles of exercise, normal and abnormal responses to exercise, and how to exercise safely.    Angina -Discuss definition of angina, causes of angina, treatment of angina, and how to decrease risk of having angina.    Cardiac Medications -Review what the following cardiac medications are used for, how they affect the body, and side effects that may occur when taking the medications.  Medications include Aspirin, Beta blockers, calcium channel blockers, ACE Inhibitors, angiotensin receptor blockers, diuretics, digoxin, and antihyperlipidemics.    Congestive Heart Failure -Discuss the definition of CHF, how to live with CHF, the signs and symptoms of CHF, and how keep track of weight and sodium intake.    Heart Disease and Intimacy -Discus the effect sexual activity has on the heart, how changes occur during intimacy as we age, and safety during sexual activity.    Smoking Cessation / COPD -Discuss different methods to quit smoking, the health benefits of quitting smoking, and the definition of COPD.    Nutrition I: Fats -Discuss the types of cholesterol, what cholesterol does to the heart, and how cholesterol levels can be controlled.    Nutrition II: Labels -Discuss the different components of food labels and how to read food label    Heart Parts/Heart Disease and PAD -Discuss the anatomy of the heart, the pathway of blood circulation through the heart, and these are affected by heart disease.    Stress I: Signs and Symptoms -Discuss the causes of stress, how stress may lead to anxiety and depression, and ways to limit stress.    Stress II: Relaxation -Discuss different types of relaxation techniques to limit stress.    Warning Signs of Stroke / TIA -Discuss definition of a stroke, what the signs and symptoms are of a stroke, and how to identify when someone is having stroke.    Knowledge Questionnaire Score:  Knowledge Questionnaire  Score - 09/25/20 1522       Knowledge Questionnaire Score   Pre Score 23/24             Core Components/Risk Factors/Patient Goals at Admission:  Personal Goals and Risk Factors at Admission - 09/25/20 1505       Core Components/Risk Factors/Patient Goals on Admission  Weight Management Weight Gain;Yes    Intervention Weight Management: Develop a combined nutrition and exercise program designed to reach desired caloric intake, while maintaining appropriate intake of nutrient and fiber, sodium and fats, and appropriate energy expenditure required for the weight goal.;Weight Management: Provide education and appropriate resources to help participant work on and attain dietary goals.    Admit Weight 123 lb 14.4 oz (56.2 kg)    Goal Weight: Short Term 126 lb (57.2 kg)    Goal Weight: Long Term 129 lb (58.5 kg)    Expected Outcomes Short Term: Continue to assess and modify interventions until short term weight is achieved;Long Term: Adherence to nutrition and physical activity/exercise program aimed toward attainment of established weight goal;Weight Gain: Understanding of general recommendations for a high calorie, high protein meal plan that promotes weight gain by distributing calorie intake throughout the day with the consumption for 4-5 meals, snacks, and/or supplements    Heart Failure Yes    Intervention Provide a combined exercise and nutrition program that is supplemented with education, support and counseling about heart failure. Directed toward relieving symptoms such as shortness of breath, decreased exercise tolerance, and extremity edema.    Expected Outcomes Improve functional capacity of life;Short term: Attendance in program 2-3 days a week with increased exercise capacity. Reported lower sodium intake. Reported increased fruit and vegetable intake. Reports medication compliance.;Short term: Daily weights obtained and reported for increase. Utilizing diuretic protocols set by  physician.;Long term: Adoption of self-care skills and reduction of barriers for early signs and symptoms recognition and intervention leading to self-care maintenance.    Hypertension Yes    Intervention Provide education on lifestyle modifcations including regular physical activity/exercise, weight management, moderate sodium restriction and increased consumption of fresh fruit, vegetables, and low fat dairy, alcohol moderation, and smoking cessation.;Monitor prescription use compliance.    Expected Outcomes Short Term: Continued assessment and intervention until BP is < 140/74mm HG in hypertensive participants. < 130/90mm HG in hypertensive participants with diabetes, heart failure or chronic kidney disease.;Long Term: Maintenance of blood pressure at goal levels.    Personal Goal Other Yes    Personal Goal Be able to participate in social activities again.    Intervention Build strength and stamina, so patient can have energy to participate in social activities.    Expected Outcomes Patient will be able to participate in social activites as tolerated.             Core Components/Risk Factors/Patient Goals Review:     Core Components/Risk Factors/Patient Goals at Discharge (Final Review):     ITP Comments:  ITP Comments     Row Name 09/25/20 1330           ITP Comments Medical Director- Dr. Fransico Him, MD                Comments: Phyliis attended orientation on 09/26/2020 to review rules and guidelines for program.  Completed 6 minute walk test, Intitial ITP, and exercise prescription.  VSS. Telemetry-Sinus Rhythm first degree heart block left bundle branch block per 12 lead ECG. P wave is hard to visualize via ECG tracing.  Cybele did report experiencing mild shortness of breath during the walk test Asymptomatic. Safety measures and social distancing in place per CDC guidelines. Patient did go over target heart rate please see previous documentation.Will fax exercise flow  sheets to Dr. Johney Frame  office for review with today's ECG tracings. Barnet Pall, RN,BSN 09/26/2020 10:48 AM

## 2020-09-26 NOTE — Patient Outreach (Signed)
[facility]) Care Management  09/26/2020  Patricia Garcia 1932-11-22 [id number]   [facility] outreach to complex care patient  Referral Date:05/11/20 Referral Source:Victoria Doug Sou [facility] RN CM hospital liaison Referral Reason:Please assign to [place] Coordinator for complex care PCP does the [facility] follow up and disease management follow up calls and assess for further needs  Insurance:[employer] Medicareandblue cross and blue shieldsupplement  Last admission 05/06/20 to 10/21/21Non ST elevation myocardial infarction (NSTEMI), CHF  05/02/20 to 10/15/21right femoral fractures/pTHA by Dr. Swinteckd/c  Patient is able to verify HIPAA ([place]) identifiers Reviewed and addressed the purpose of the follow up call with the patient  Consent: [facility] ([place]) RN CM reviewed [facility] services with patient. Patient gave verbal consent for services.    Follow up She reports she is feeling much better after discontinued Brilinta She voices lots of appreciation for her cardiology provider and team for assistance  No further medication side effects voiced  She is sleeping better and breathing better She is driving herself to her appointments and short trips She reports de-cluttering of a room in her home to have for herself She is still supported by her friend [name], her son and daughter in law  She still has some crying episodes She had one on 09/25/20  Cardiac rehab -on 09/25/20 had orientation completed She reports walking a long distance in the hospital before and after the procedure  Social [name], son,  had cerebrovascular accident (CVA) on 09/25/20 She visited [place] on 09/25/20 after her cardiac rehab and he was discharged home He is doing better today   Anxiety took a xanax on 09/25/20 [facility] discussed - still not preferring services She did report a brief tearful moment as she walked through part of the  hospital and had thoughts about her deceased husband  Diet still not good She reports a weight gain to 124 lbs Follow up with Dr Felipa Eth on Tuesday 10/02/20 She would prefer not to attend this visit Requests assistance with outreach to MD office and note in EPIC in basket [facility] RN CM to complete this request   Hair coming out   Sinus -She voiced not wanting any further oral medication and will attempt natural/complementary products like elderberry syrup  Plans Patient agrees to care plan and follow up within the next 30-45 business days Pt encouraged to return a call to [facility] RN CM prn Goals Addressed              This Visit's Progress     Patient Stated   .  COMPLETED: [facility]) Find Help in My Community (pt-stated)   On track     Follow Up Date   - call 211 when I need some help - follow-up on any referrals for help I am given - have a back-up plan    Notes: 09/26/20 Goal met has started cardiac rehab and she will be able to transport herself but did receive the resources sent 08/10/20 unsuccessful attempt to assist her in finding out her [employer] transportation benefits No answer at [phone]  08/06/20 She confirmed she received a call from Care guide, [name] 08/02/20 Care guide referral for transportation    .  COMPLETED: [facility]) Manage My Medicine (pt-stated)   On track     Timeframe:  Short-Term Goal Priority:  High Start Date:               08/10/20  Expected End Date:      09/17/20                 Follow Up Date  08/24/20    - call if I am sick and can't take my medicine - keep a list of all the medicines I take; vitamins and herbals too    Notes: 09/26/20 goal met Medicines reviewed and  08/10/20 [facility] pharmacy referral ordered for continued reported side effects per pt from [place]     .  COMPLETED: ([facility]) Prevent Falls and Injury (pt-stated)   On track     Follow Up Date 08/24/20   - always use handrails on the stairs - always wear low-heeled or flat shoes or  slippers with nonskid soles - keep my cell phone with me always - use a cane or walker - wear my glasses and/or hearing aid - attend therapy    Notes: 09/26/20 Goal met No further falls. Completed outpatient rehab, started cardiac rehab 09/26/20 08/02/20 last fall was in 04/2020 starting outpatient rehab on 08/07/20    .  [facility]) Track and Manage Symptoms (pt-stated)   On track     Follow Up Date 10/25/20   - eat more whole grains, fruits and vegetables, lean meats and healthy fats - follow rescue plan if symptoms flare-up - know when to call the doctor - track symptoms and what helps feel better or worse - dress right for the weather, hot or cold    Notes: 09/26/20 She reports she is feeling much better after discontinued Brilinta No further medication side effects voiced She is sleeping better and breathing better She is driving herself to her appointments and short trips 08/10/20  She reports she still feels she is having symptoms (dizziness, poor appetite, poor sleep from [name] and used xanax last night to get better sleep 08/02/20 monitoring symptoms, starting outpatient rehab on 08/07/20 for hip mobility        [name] L. Lavina Hamman, RN, BSN, [place] Coordinator Office number [phone] Main [facility] number [phone] Fax number [phone]

## 2020-09-27 ENCOUNTER — Other Ambulatory Visit: Payer: Self-pay | Admitting: *Deleted

## 2020-09-27 ENCOUNTER — Encounter: Payer: Self-pay | Admitting: *Deleted

## 2020-09-27 NOTE — Patient Outreach (Signed)
[facility]) Care Management  09/27/2020  JAZMIN LEY [birth year] [id number]    [place] coordination- collaboration with primary care provider (PCP)   Outreach to Dr Felipa Eth office for patient as she requesting about her 10/02/20 appointment  Dema Severin in the office of Dr Felipa Eth to cancel the 10/01/20 Tuesday appointment as pt requested Appointment cancelled  Plan [facility] RN CM will follow up with Mrs Monical within the next 30 business days    [place] L. Lavina Hamman, RN, BSN, [place] Coordinator Office number [phone] Mobile number [phone]  Main [facility] number [phone] Fax number [phone]

## 2020-10-03 ENCOUNTER — Other Ambulatory Visit: Payer: Self-pay

## 2020-10-03 ENCOUNTER — Encounter (HOSPITAL_COMMUNITY)
Admission: RE | Admit: 2020-10-03 | Discharge: 2020-10-03 | Disposition: A | Discharge status: Home / Self Care | Payer: Medicare Other | Source: Ambulatory Visit | Attending: Cardiology | Admitting: Cardiology

## 2020-10-03 DIAGNOSIS — I214 Non-ST elevation (NSTEMI) myocardial infarction: Secondary | ICD-10-CM

## 2020-10-03 DIAGNOSIS — I440 Atrioventricular block, first degree: Secondary | ICD-10-CM | POA: Diagnosis not present

## 2020-10-03 DIAGNOSIS — Z955 Presence of coronary angioplasty implant and graft: Secondary | ICD-10-CM

## 2020-10-03 DIAGNOSIS — I447 Left bundle-branch block, unspecified: Secondary | ICD-10-CM | POA: Diagnosis not present

## 2020-10-03 NOTE — Progress Notes (Signed)
Daily Session Note  Patient Details  Name: JANINE RELLER MRN: [id number] Date of Birth: 1932/11/28 Referring Provider:   Flowsheet Row CARDIAC REHAB PHASE II ORIENTATION from 09/25/2020 in [facility] CARDIAC [facility]  Referring Provider Meriam Sprague, MD      Encounter Date: 10/03/2020  Check In:  Session Check In - 10/03/20 1327      Check-In   Supervising physician immediately available to respond to emergencies [facility] immediately available    Physician(s) Dr. Albertine Grates    Location [facility]    Staff Present Lorin Picket, MS, EP-C, CCRP;Olinty Peggye Pitt, MS, ACSM CEP, Exercise Physiologist;Doron Shake Harlon Flor, RN, BSN;Harriett Sine, RN, MHA;Other   Hughie Closs EP   Virtual Visit No    Medication changes reported     No    Fall or balance concerns reported    No    Tobacco Cessation No Change    Warm-up and Cool-down Not performed (comment)   Cardiac Rehab Orientation   Resistance Training Performed No    VAD Patient? No    PAD/SET Patient? No      Pain Assessment   Currently in Pain? No/denies    Pain Score 0-No pain    Multiple Pain Sites No           Capillary Blood Glucose: No results found for this or any previous visit (from the past 24 hour(s)).   Exercise Prescription Changes - 10/03/20 1400      Response to Exercise   Blood Pressure (Admit) 138/80    Blood Pressure (Exercise) 154/78    Blood Pressure (Exit) 118/84    Heart Rate (Admit) 98 bpm    Heart Rate (Exercise) 132 bpm    Heart Rate (Exit) 96 bpm    Rating of Perceived Exertion (Exercise) 13    Symptoms Lighthededness    Comments PT's first day of exercise in the CRP2 program    Duration Continue with 30 min of aerobic exercise without signs/symptoms of physical distress.    Intensity THRR unchanged      Progression   Progression Continue to progress workloads to maintain intensity without signs/symptoms of physical distress.    Average METs  1.2      Resistance Training   Training Prescription No    Weight No weights on wednesdays      Interval Training   Interval Training No      NuStep   Level 2    SPM 70    Minutes 18    METs 1.2      Track   Laps 3    Minutes 7           Social History   Tobacco Use  Smoking Status Former Smoker  . Quit date: 06/05/1984  . Years since quitting: 36.3  Smokeless Tobacco Never Used    Goals Met:  Exercise tolerated well  Goals Unmet:  Not Applicable  Comments: Pt started cardiac rehab today.  Pt tolerated light exercise without difficulty. Niharika did report feeling a little lightheaded as she has never used a nustep before. After walking a few laps Aristea decided that she will use the nustep and not walk the track on her next exercise session. VSS, telemetry-Sinus Rhythm first degree heart block, bundle branch block per 12 lead ECG obtained during orientation, asymptomatic.  Medication list reconciled. Pt denies barriers to medicaiton compliance.  PSYCHOSOCIAL ASSESSMENT:  PHQ-0. Pt exhibits positive coping skills, hopeful outlook with supportive  family. No psychosocial needs identified at this time, no psychosocial interventions necessary.    Pt enjoys bowling  and spending time at church. .   Pt oriented to exercise equipment and routine.    Understanding verbalized.Barnet Pall, RN,BSN 10/03/2020 2:57 PM   Dr. Fransico Him is Medical Director for Cardiac Rehab at [facility].

## 2020-10-05 ENCOUNTER — Other Ambulatory Visit: Payer: Self-pay

## 2020-10-05 ENCOUNTER — Encounter (HOSPITAL_COMMUNITY)
Admission: RE | Admit: 2020-10-05 | Discharge: 2020-10-05 | Disposition: A | Discharge status: Home / Self Care | Payer: Medicare Other | Source: Ambulatory Visit | Attending: Cardiology | Admitting: Cardiology

## 2020-10-05 DIAGNOSIS — I214 Non-ST elevation (NSTEMI) myocardial infarction: Secondary | ICD-10-CM | POA: Diagnosis not present

## 2020-10-05 DIAGNOSIS — Z955 Presence of coronary angioplasty implant and graft: Secondary | ICD-10-CM | POA: Diagnosis not present

## 2020-10-05 DIAGNOSIS — I440 Atrioventricular block, first degree: Secondary | ICD-10-CM | POA: Diagnosis not present

## 2020-10-05 DIAGNOSIS — I447 Left bundle-branch block, unspecified: Secondary | ICD-10-CM | POA: Diagnosis not present

## 2020-10-10 ENCOUNTER — Other Ambulatory Visit: Payer: Self-pay

## 2020-10-10 ENCOUNTER — Encounter (HOSPITAL_COMMUNITY)
Admission: RE | Admit: 2020-10-10 | Discharge: 2020-10-10 | Disposition: A | Discharge status: Home / Self Care | Payer: Medicare Other | Source: Ambulatory Visit | Attending: Cardiology | Admitting: Cardiology

## 2020-10-10 ENCOUNTER — Telehealth: Payer: Self-pay | Admitting: *Deleted

## 2020-10-10 DIAGNOSIS — I214 Non-ST elevation (NSTEMI) myocardial infarction: Secondary | ICD-10-CM | POA: Diagnosis not present

## 2020-10-10 DIAGNOSIS — Z955 Presence of coronary angioplasty implant and graft: Secondary | ICD-10-CM | POA: Diagnosis not present

## 2020-10-10 DIAGNOSIS — I447 Left bundle-branch block, unspecified: Secondary | ICD-10-CM | POA: Diagnosis not present

## 2020-10-10 DIAGNOSIS — I440 Atrioventricular block, first degree: Secondary | ICD-10-CM | POA: Diagnosis not present

## 2020-10-10 NOTE — Telephone Encounter (Signed)
   Primary Cardiologist: Freada Bergeron, MD  Chart reviewed as part of pre-operative protocol coverage. Simple dental extractions are considered low risk procedures per guidelines and generally do not require any specific cardiac clearance. It is also generally accepted that for simple extractions and dental cleanings, there is no need to interrupt blood thinner therapy.   SBE prophylaxis is not required for the patient.  I will route this recommendation to the requesting party via Epic fax function and remove from pre-op pool.  Please call with questions.  Deberah Pelton, NP 10/10/2020, 12:11 PM

## 2020-10-10 NOTE — Telephone Encounter (Signed)
   [facility] Pre-operative Risk Assessment    HEARTCARE STAFF: - Please ensure there is not already an duplicate clearance open for this procedure. - Under Visit Info/Reason for Call, type in Other and utilize the format Clearance MM/DD/YY or Clearance TBD. Do not use dashes or single digits. - If request is for dental extraction, please clarify the # of teeth to be extracted.  Request for surgical clearance: CLEARANCE REQUEST IS ALSO ASKING PT SHOULD BE TAKING MEDICATION FOR VERTIGO?  1. What type of surgery is being performed? 1 TOOTH TO BE REMOVED   2. When is this surgery scheduled? TBD   3. What type of clearance is required (medical clearance vs. Pharmacy clearance to hold med vs. Both)? MEDICAL  4. Are there any medications that need to be held prior to surgery and how long? PLAVIX BEING REQUESTED BE HELD, PT IS ON ASA THOUGH THIS IS NOT ON CLEARANCE AS NEEDING TO BE HELD; REQUEST ALSO ASK IF PT NEEDS SBE  5. Practice name and name of physician performing surgery? [facility]; DR. Chelsea Aus, DDS   6. What is the office phone number? [phone]   7.   What is the office fax number? [phone]  8.   Anesthesia type (None, local, MAC, general) ? LOCAL   Patricia Garcia 10/10/2020, 11:14 AM  _________________________________________________________________   (provider comments below)

## 2020-10-11 DIAGNOSIS — I110 Hypertensive heart disease with heart failure: Secondary | ICD-10-CM | POA: Diagnosis not present

## 2020-10-11 DIAGNOSIS — E785 Hyperlipidemia, unspecified: Secondary | ICD-10-CM | POA: Diagnosis not present

## 2020-10-11 DIAGNOSIS — M160 Bilateral primary osteoarthritis of hip: Secondary | ICD-10-CM | POA: Diagnosis not present

## 2020-10-11 DIAGNOSIS — I129 Hypertensive chronic kidney disease with stage 1 through stage 4 chronic kidney disease, or unspecified chronic kidney disease: Secondary | ICD-10-CM | POA: Diagnosis not present

## 2020-10-11 DIAGNOSIS — M479 Spondylosis, unspecified: Secondary | ICD-10-CM | POA: Diagnosis not present

## 2020-10-11 DIAGNOSIS — E7800 Pure hypercholesterolemia, unspecified: Secondary | ICD-10-CM | POA: Diagnosis not present

## 2020-10-11 DIAGNOSIS — I255 Ischemic cardiomyopathy: Secondary | ICD-10-CM | POA: Diagnosis not present

## 2020-10-11 DIAGNOSIS — K219 Gastro-esophageal reflux disease without esophagitis: Secondary | ICD-10-CM | POA: Diagnosis not present

## 2020-10-11 DIAGNOSIS — I214 Non-ST elevation (NSTEMI) myocardial infarction: Secondary | ICD-10-CM | POA: Diagnosis not present

## 2020-10-11 DIAGNOSIS — I5033 Acute on chronic diastolic (congestive) heart failure: Secondary | ICD-10-CM | POA: Diagnosis not present

## 2020-10-11 DIAGNOSIS — N1831 Chronic kidney disease, stage 3a: Secondary | ICD-10-CM | POA: Diagnosis not present

## 2020-10-11 DIAGNOSIS — I2510 Atherosclerotic heart disease of native coronary artery without angina pectoris: Secondary | ICD-10-CM | POA: Diagnosis not present

## 2020-10-12 ENCOUNTER — Encounter (HOSPITAL_COMMUNITY)
Admission: RE | Admit: 2020-10-12 | Discharge: 2020-10-12 | Disposition: A | Discharge status: Home / Self Care | Payer: Medicare Other | Source: Ambulatory Visit | Attending: Cardiology | Admitting: Cardiology

## 2020-10-12 ENCOUNTER — Other Ambulatory Visit: Payer: Self-pay

## 2020-10-12 DIAGNOSIS — I214 Non-ST elevation (NSTEMI) myocardial infarction: Secondary | ICD-10-CM

## 2020-10-12 DIAGNOSIS — Z955 Presence of coronary angioplasty implant and graft: Secondary | ICD-10-CM | POA: Diagnosis not present

## 2020-10-12 DIAGNOSIS — I447 Left bundle-branch block, unspecified: Secondary | ICD-10-CM | POA: Diagnosis not present

## 2020-10-12 DIAGNOSIS — I440 Atrioventricular block, first degree: Secondary | ICD-10-CM | POA: Diagnosis not present

## 2020-10-13 NOTE — Progress Notes (Addendum)
Cardiology Office Note:    Date:  10/15/2020   ID:  Patricia Garcia, DOB April 15, 1933, MRN [id number]  PCP:  Lajean Manes, MD   [place]  Cardiologist:  Freada Bergeron, MD  Advanced Practice Provider:  No care team member to display Electrophysiologist:  None    Referring MD: Lajean Manes, MD    History of Present Illness:    Patricia Garcia is a 87 y.o. female with a hx of LBBB, hypertension, diverticulosis, arthritis, depression,rightfemur fractures/p THA10/13/2021, depression, osteopenia, chronic diastolic CHF, CAD s/p recent NSTEMIwho presents to clinic for follow-up.   The patient suffered amechanical fall resulting in hip fracture requiring surgery on 05/02/20. She was readmitted to [facility] 10/17/21with nausea, vomiting, and hypoxia.She was ultimately found to have aNSTEMI (trops in 3k range)and acute on chronic diastolic heart failure. She was treated with IV Lasix.TTE10/18/2021 showed hypokinesis of the basal inferior and inferolateral wall, EF 50 to 62%, grade 2 diastolic dysfunction, elevated left atrial pressure, mild mitral regurgitation. She underwent cardiac cath 05/08/2020 showing three-vessel obstructive coronary artery disease as outlined below ultimatelyreceived DES tomid RCA and first diagonal. Hospitalization was also notable for anemia requiring blood transfusion, hypokalemia, gait instability, hypokalemia, hypoalbuminemia, mild AKI, and nocturnal confusion.  During our visit on 08/24/20, the patient was very distressed as she had been feeling unwell for several weeks. Specifically, she stated that she had been feeling more fatigued, nervous, depressed and listless. She had to lay down after minimal activity. Noted blood in her stool about 4 days. No black or tarry stool. Had been dizzy inwith position changes and rolling over in bed. No orthopnea, PND, LE edema. During that visit, we stopped her clonidine,  changed ticagrelor to plavix, started losartan and checked CBC and CXR which were normal. She then stopped her losartan as she was concerned about side-effects of feeling too tired and causing he to have crying episodes.   During last visit on 09/10/20, she was feeling better. Was still having periods where she felt overwhelmed and sad. No further blood in her stool. She was looking forward to starting cardiac rehab.  The patient states that she feels great. She is loving cardiac rehab. She had one episode where her heart rate was 133 and was told to rest but she felt great at that time with no palpitations, chest pain or SOB. No lightheadedness, dizziness, chest pain, shortness of breath, or PND. Blood pressure is well controlled. Has been walking outside at home when not at rehab.   Reviewed strips sent from cardiac rehab. Given significant 1 degree AVB suspect NSR over SVT. No contraindication to continue rehab.  Past Medical History:  Diagnosis Date  . Anal or rectal pain   . Anemia   . Arthritis   . CAD (coronary artery disease)    a. NSTEMI 04/2020 s/p DES to [facility] and D1.  . Chronic diastolic CHF (congestive heart failure) ([place])   . Depression   . Diverticulosis of colon (without mention of hemorrhage)   . Hypertension   . Hypoalbuminemia   . Internal hemorrhoids without mention of complication   . Ischemic heart disease   . LBBB (left bundle branch block)   . Osteopenia   . Renal insufficiency   . S/P right hip fracture   . Slow transit constipation     Past Surgical History:  Procedure Laterality Date  . ABDOMINAL HYSTERECTOMY    . CARDIAC CATHETERIZATION    . CATARACT EXTRACTION, BILATERAL    .  CORONARY STENT INTERVENTION N/A 05/08/2020   Procedure: CORONARY STENT INTERVENTION;  Surgeon: Martinique, Peter M, MD;  Location: [place] [facility];  Service: Cardiovascular;  Laterality: N/A;  . LEFT HEART CATH AND CORONARY ANGIOGRAPHY N/A 05/08/2020   Procedure: LEFT HEART CATH  AND CORONARY ANGIOGRAPHY;  Surgeon: Martinique, Peter M, MD;  Location: [place] [facility];  Service: Cardiovascular;  Laterality: N/A;  . MASS EXCISION  06/10/2011   Procedure: EXCISION MASS;  Surgeon: Wynonia Sours, MD;  Location: [place];  Service: Orthopedics;  Laterality: Right;  excision cyst right thumb IP, debridement IP right thumb  . TONSILLECTOMY    . TOTAL HIP ARTHROPLASTY Right 05/02/2020   Procedure: TOTAL HIP ARTHROPLASTY ANTERIOR APPROACH;  Surgeon: Rod Can, MD;  Location: [facility];  Service: Orthopedics;  Laterality: Right;    Current Medications: Current Meds  Medication Sig  . aspirin 81 MG chewable tablet aspirin 81 mg chewable tablet  . Calcium Carbonate-Vit D-Min (CALCIUM 1200 PO) Take 1,200 mg by mouth daily.  . cholecalciferol (VITAMIN D) 1000 UNITS tablet Take 1,000 Units by mouth daily.  . clopidogrel (PLAVIX) 75 MG tablet Take 1 tablet (75 mg total) by mouth daily.  Marland Kitchen docusate sodium (COLACE) 100 MG capsule Take 100 mg by mouth 3 (three) times a week.  . esomeprazole (NEXIUM) 40 MG capsule Take 1 capsule (40 mg total) by mouth daily.  . ferrous sulfate 325 (65 FE) MG tablet Take 325 mg by mouth See admin instructions. 3 times a week  . latanoprost (XALATAN) 0.005 % ophthalmic solution Place 1 drop into both eyes at bedtime.  . multivitamin-iron-minerals-folic acid (CENTRUM) chewable tablet Chew 1 tablet by mouth daily.  . Naphazoline-Pheniramine (OPCON-A OP) Place 1 drop into both eyes daily as needed (for eye allergy).  . Omega-3 Fatty Acids (FISH OIL) 1000 MG CAPS Take 1,000 mg by mouth daily.  . sodium chloride (MURO 128) 2 % ophthalmic solution Place 1 drop into both eyes 2 (two) times daily as needed for eye irritation.     Allergies:   Penicillins, Prednisone, Statins, Sulfonamide derivatives, and Tramadol   Social History   Socioeconomic History  . Marital status: Widowed    Spouse name: Not on file  . Number of children: 2  .  Years of education: 8  . Highest education level: High school graduate  Occupational History  . Occupation: Retired    Fish farm manager: RETIRED  Tobacco Use  . Smoking status: Former Smoker    Quit date: 06/05/1984    Years since quitting: 36.3  . Smokeless tobacco: Never Used  Vaping Use  . Vaping Use: Never used  Substance and Sexual Activity  . Alcohol use: No  . Drug use: No  . Sexual activity: Not on file  Other Topics Concern  . 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 Difficulty of Paying Living Expenses: Not hard at all  Food Insecurity: No Food Insecurity  . Worried About Charity fundraiser in the Last Year: Never true  . Ran Out of Food in the Last Year: Never true  Transportation Needs: No Transportation Needs  . Lack of Transportation (Medical): No  . Lack of Transportation (Non-Medical): No  Physical Activity: Not on file  Stress: No Stress Concern  Present  . Feeling of Stress : Only a little  Social Connections: Moderately Integrated  . Frequency of Communication with Friends and Family: More than three times a week  . Frequency of Social Gatherings with Friends and Family: More than three times a week  . Attends Religious Services: 1 to 4 times per year  . Active Member of Clubs or Organizations: Yes  . Attends Archivist Meetings: More than 4 times per year  . Marital Status: Widowed     Family History: The patient's family history includes Cancer in her father; Colon polyps in her mother; Heart disease in her father.  ROS:   Please see the history of present illness.    Review of Systems  Constitutional: Negative for chills, fever and malaise/fatigue.  HENT: Negative for hearing loss.   Eyes: Negative for blurred vision and redness.  Respiratory: Negative for shortness of  breath.   Cardiovascular: Negative for chest pain, palpitations, orthopnea, claudication, leg swelling and PND.  Gastrointestinal: Negative for blood in stool and melena.  Genitourinary: Negative for dysuria and flank pain.  Musculoskeletal: Negative for falls.  Neurological: Negative for dizziness and loss of consciousness.  Endo/Heme/Allergies: Negative for polydipsia.  Psychiatric/Behavioral: Negative for substance abuse.    EKGs/Labs/Other Studies Reviewed:    The following studies were reviewed today: Cardiac Cath 05/08/20  1st Diag lesion is 85% stenosed.  Ramus lesion is 30% stenosed.  Prox Cx to Mid Cx lesion is 90% stenosed.  Prox RCA to Mid RCA lesion is 95% stenosed.  Post intervention, there is a 0% residual stenosis.  A drug-eluting stent was successfully placed using a STENT RESOLUTE ONYX 3.0X15.  Post intervention, there is a 0% residual stenosis.  A drug-eluting stent was successfully placed using a [place] 2.25X15.  LV end diastolic pressure is mildly elevated.  1. 3 vessel obstructive CAD - 95% mid RCA- this appeared to be the culprit lesion - 85% proxima first diagonal - 90% very small LCx in the AV groove 2. Mildly elevated LVEDP 3. Successful PCI of the mid RCA with DES x 1 4. Successful PCI of the first diagonal with DES x 1  Plan: DAPT for one year. Will transition lasix to po. Further plans per primary team.   Echo 05/07/20 IMPRESSIONS  1. Hypokinesis of the basal inferior and inferolateral wall with overall  preserved LV function.  2. Left ventricular ejection fraction, by estimation, is 50 to 55%. The  left ventricle has low normal function. The left ventricle demonstrates  regional wall motion abnormalities (see scoring diagram/findings for  description). Left ventricular diastolic  parameters are consistent with Grade II diastolic dysfunction  (pseudonormalization). Elevated left atrial pressure.  3. Right  ventricular systolic function is normal. The right ventricular  size is normal. There is mildly elevated pulmonary artery systolic  pressure.  4. Moderate pleural effusion in the left lateral region.  5. The mitral valve is normal in structure. Mild mitral valve  regurgitation. No evidence of mitral stenosis.  6. The aortic valve is tricuspid. Aortic valve regurgitation is trivial.  No aortic stenosis is present.  7. The inferior vena cava is normal in size with greater than 50%  respiratory variability, suggesting right atrial pressure of 3 mmHg.   FINDINGS  Left Ventricle: Left ventricular ejection fraction, by estimation, is 50  to 55%. The left ventricle has low normal function. The left ventricle  demonstrates regional wall motion abnormalities. The left ventricular  internal cavity size was  normal in  size. There is no left ventricular hypertrophy. Left ventricular diastolic  parameters are consistent with Grade II diastolic dysfunction  (pseudonormalization). Elevated left atrial pressure.   Right Ventricle: The right ventricular size is normal.Right ventricular  systolic function is normal. There is mildly elevated pulmonary artery  systolic pressure. The tricuspid regurgitant velocity is 3.08 m/s, and  with an assumed right atrial pressure of  3 mmHg, the estimated right ventricular systolic pressure is 29.5 mmHg.   Left Atrium: Left atrial size was normal in size.   Right Atrium: Right atrial size was normal in size.   Pericardium: There is no evidence of pericardial effusion.   Mitral Valve: The mitral valve is normal in structure. Mild mitral annular  calcification. Mild mitral valve regurgitation. No evidence of mitral  valve stenosis.   Tricuspid Valve: The tricuspid valve is normal in structure. Tricuspid  valve regurgitation is mild . No evidence of tricuspid stenosis.   Aortic Valve: The aortic valve is tricuspid. Aortic valve regurgitation is  trivial.  No aortic stenosis is present.   Pulmonic Valve: The pulmonic valve was normal in structure. Pulmonic valve  regurgitation is trivial. No evidence of pulmonic stenosis.   Aorta: The aortic root is normal in size and structure.   Venous: The inferior vena cava is normal in size with greater than 50%  respiratory variability, suggesting right atrial pressure of 3 mmHg.   EKG: NSR, 1 degree AVB, LBBB (chronic)   Recent Labs: 05/06/2020: B Natriuretic Peptide 1,221.6 05/10/2020: Magnesium 2.1 07/16/2020: ALT 14 08/24/2020: BUN 18; Creatinine, Ser 0.99; Hemoglobin 11.7; Platelets 349; Potassium 4.2; Sodium 138  Recent Lipid Panel    Component Value Date/Time   CHOL 257 (H) 07/16/2020 0904   TRIG 240 (H) 07/16/2020 0904   HDL 49 07/16/2020 0904   CHOLHDL 5.2 (H) 07/16/2020 0904   CHOLHDL 4.4 05/08/2020 0457   VLDL 25 05/08/2020 0457   LDLCALC 163 (H) 07/16/2020 0904     Physical Exam:    VS:  BP 130/72   Pulse 94   Ht 5\' 1"  (1.549 m)   Wt 123 lb 3.2 oz (55.9 kg)   SpO2 96%   BMI 23.28 kg/m     Wt Readings from Last 3 Encounters:  10/15/20 123 lb 3.2 oz (55.9 kg)  09/26/20 124 lb (56.2 kg)  09/25/20 123 lb 14.4 oz (56.2 kg)     GEN:  Well nourished, well developed in no acute distress HEENT: Normal NECK: No JVD; No carotid bruits CARDIAC: RRR, no murmurs, rubs, gallops RESPIRATORY:  Clear to auscultation without rales, wheezing or rhonchi  ABDOMEN: Soft, non-tender, non-distended MUSCULOSKELETAL:  No edema; No deformity  SKIN: Warm and dry NEUROLOGIC:  Alert and oriented x 3 PSYCHIATRIC:  Normal affect   ASSESSMENT:    1. CAD in native artery   2. Essential hypertension   3. Hyperlipidemia LDL goal <70   4. Non-ST elevation myocardial infarction (NSTEMI), subsequent episode of care ([place])   5. Chronic diastolic CHF (congestive heart failure) ([facility])    PLAN:    In order of problems listed above:  #NSTEMI: #Multivessel CAD: Patient with recent  hospitalization in 04/2020 for NSTEMI found to have multivessel CAD s/psuccessful PCI to RCA and D1 on 05/08/20. She was doing well initially but now with increasing fatigue, DOE, and decreased energy. Also with new blood in her stool over the past week. Patient was very concerned this was all related to her ticagrelor which has subsequently  been changed to plavix. Notably she stopped her metoprolol and zetia as she felt like they made her feel poorly. -TTE withLVEF of 50-55% with hypokinesis of the basal inferior and inferolateral wall, grade 2 diastolic dysfunction, moderate left pleural effusion, and mildly elevated PASP -Changed ticagrelor to plavix due to BRBPR; tolerating plavix without issue -Continue ASA 81mg  daily -Patient unwilling to resume BB, losartan or zetia at this time due to reported side-effects -Continue cardiac rehab  #Chronic Diastolic CHF: Appears euvolemic today. Not on diuretic therapy -Stopped metop due to side-effects -Stopped losartan 50mg  daily due to reported side effects -Will try to optimize medications once she feels better; given frequent symptoms with medications, will hold off on adding anything additional today -Low Na diet -Daily weights  #Blood in Stool: Resolved without evidence of re-bleed. Like hemorrhoidal. HgB normal. Recommended GI follow-up but has not occurred. -Continue iron supplementation -Changed famotidine to nexium  #Hypertension: -Stopped clonidineas likely causing blood pressure swings -Patient stopped metop as above due to side effects - Patient stopped losartan due to reported side effects - Will continued to monitor; blood pressure overall better controlled at this time and given history of multiple intolerances, will not rechallenge with medications at this time  #Fatigue: Significantly improved. Suspect was secondary to blood pressure effects as well as depression.  -Start cardiac rehab -Continue follow-up with PCP for  depression  #Vertigo: Resolved. -Follow-up with PCP as scheduled  #Depression: -Follow-up with PCP     Medication Adjustments/Labs and Tests Ordered: Current medicines are reviewed at length with the patient today.  Concerns regarding medicines are outlined above.  Orders Placed This Encounter  Procedures  . EKG 12-Lead   No orders of the defined types were placed in this encounter.   Patient Instructions  Medication Instructions:   Your physician recommends that you continue on your current medications as directed. Please refer to the Current Medication list given to you today.  *If you need a refill on your cardiac medications before your next appointment, please call your pharmacy*    Follow-Up: At [facility], you and your health needs are our priority.  As part of our continuing mission to provide you with exceptional heart care, we have created designated Provider Care Teams.  These Care Teams include your primary Cardiologist (physician) and Advanced Practice Providers (APPs -  Physician Assistants and Nurse Practitioners) who all work together to provide you with the care you need, when you need it.  We recommend signing up for the patient portal called "MyChart".  Sign up information is provided on this After Visit Summary.  MyChart is used to connect with patients for Virtual Visits (Telemedicine).  Patients are able to view lab/test results, encounter notes, upcoming appointments, etc.  Non-urgent messages can be sent to your provider as well.   To learn more about what you can do with MyChart, go to [email or web address].    Your next appointment:   6 month(s)  The format for your next appointment:   In Person  Provider:   You will see one of the following Advanced Practice Providers on your designated Care Team:    Richardson Dopp, PA-C  Robbie Lis, PA-C       Signed, Freada Bergeron, MD  10/15/2020 2:01 PM    [facility]

## 2020-10-15 ENCOUNTER — Encounter: Payer: Self-pay | Admitting: Cardiology

## 2020-10-15 ENCOUNTER — Ambulatory Visit (INDEPENDENT_AMBULATORY_CARE_PROVIDER_SITE_OTHER): Payer: Medicare Other | Admitting: Cardiology

## 2020-10-15 ENCOUNTER — Other Ambulatory Visit: Payer: Self-pay

## 2020-10-15 VITALS — BP 130/72 | HR 94 | Ht 61.0 in | Wt 123.2 lb

## 2020-10-15 DIAGNOSIS — I2510 Atherosclerotic heart disease of native coronary artery without angina pectoris: Secondary | ICD-10-CM | POA: Diagnosis not present

## 2020-10-15 DIAGNOSIS — I214 Non-ST elevation (NSTEMI) myocardial infarction: Secondary | ICD-10-CM | POA: Diagnosis not present

## 2020-10-15 DIAGNOSIS — I10 Essential (primary) hypertension: Secondary | ICD-10-CM | POA: Diagnosis not present

## 2020-10-15 DIAGNOSIS — E785 Hyperlipidemia, unspecified: Secondary | ICD-10-CM

## 2020-10-15 DIAGNOSIS — I5032 Chronic diastolic (congestive) heart failure: Secondary | ICD-10-CM

## 2020-10-15 NOTE — Patient Instructions (Signed)
Medication Instructions:   Your physician recommends that you continue on your current medications as directed. Please refer to the Current Medication list given to you today.  *If you need a refill on your cardiac medications before your next appointment, please call your pharmacy*    Follow-Up: At [facility], you and your health needs are our priority.  As part of our continuing mission to provide you with exceptional heart care, we have created designated Provider Care Teams.  These Care Teams include your primary Cardiologist (physician) and Advanced Practice Providers (APPs -  Physician Assistants and Nurse Practitioners) who all work together to provide you with the care you need, when you need it.  We recommend signing up for the patient portal called "MyChart".  Sign up information is provided on this After Visit Summary.  MyChart is used to connect with patients for Virtual Visits (Telemedicine).  Patients are able to view lab/test results, encounter notes, upcoming appointments, etc.  Non-urgent messages can be sent to your provider as well.   To learn more about what you can do with MyChart, go to [email or web address].    Your next appointment:   6 month(s)  The format for your next appointment:   In Person  Provider:   You will see one of the following Advanced Practice Providers on your designated Care Team:    Richardson Dopp, PA-C  [place], [place]

## 2020-10-16 NOTE — Progress Notes (Signed)
Cardiac Individual Treatment Plan  Patient Details  Name: Patricia Garcia MRN: [id number] Date of Birth: Mar 04, 1933 Referring Provider:   Flowsheet Row CARDIAC REHAB PHASE II ORIENTATION from 09/25/2020 in [place]  Referring Provider Freada Bergeron, MD      Initial Encounter Date:  [place] PHASE II ORIENTATION from 09/25/2020 in [place]  Date 09/25/20      Visit Diagnosis: NSTEMI 05/06/20  05/08/20 S/P DES RCA and Diagonal  Patient's Home Medications on Admission:  Current Outpatient Medications:  .  aspirin 81 MG chewable tablet, aspirin 81 mg chewable tablet, Disp: , Rfl:  .  Calcium Carbonate-Vit D-Min (CALCIUM 1200 PO), Take 1,200 mg by mouth daily., Disp: , Rfl:  .  cholecalciferol (VITAMIN D) 1000 UNITS tablet, Take 1,000 Units by mouth daily., Disp: , Rfl:  .  clopidogrel (PLAVIX) 75 MG tablet, Take 1 tablet (75 mg total) by mouth daily., Disp: 90 tablet, Rfl: 3 .  docusate sodium (COLACE) 100 MG capsule, Take 100 mg by mouth 3 (three) times a week., Disp: , Rfl:  .  esomeprazole (NEXIUM) 40 MG capsule, Take 1 capsule (40 mg total) by mouth daily., Disp: 90 capsule, Rfl: 1 .  ferrous sulfate 325 (65 FE) MG tablet, Take 325 mg by mouth See admin instructions. 3 times a week, Disp: , Rfl:  .  latanoprost (XALATAN) 0.005 % ophthalmic solution, Place 1 drop into both eyes at bedtime., Disp: , Rfl:  .  multivitamin-iron-minerals-folic acid (CENTRUM) chewable tablet, Chew 1 tablet by mouth daily., Disp: , Rfl:  .  Naphazoline-Pheniramine (OPCON-A OP), Place 1 drop into both eyes daily as needed (for eye allergy)., Disp: , Rfl:  .  Omega-3 Fatty Acids (FISH OIL) 1000 MG CAPS, Take 1,000 mg by mouth daily., Disp: , Rfl:  .  sodium chloride (MURO 128) 2 % ophthalmic solution, Place 1 drop into both eyes 2 (two) times daily as needed for eye irritation., Disp: , Rfl:   Past Medical  History: Past Medical History:  Diagnosis Date  . Anal or rectal pain   . Anemia   . Arthritis   . CAD (coronary artery disease)    a. NSTEMI 04/2020 s/p DES to [facility] and D1.  . Chronic diastolic CHF (congestive heart failure) ([place])   . Depression   . Diverticulosis of colon (without mention of hemorrhage)   . Hypertension   . Hypoalbuminemia   . Internal hemorrhoids without mention of complication   . Ischemic heart disease   . LBBB (left bundle branch block)   . Osteopenia   . Renal insufficiency   . S/P right hip fracture   . Slow transit constipation     Tobacco Use: Social History   Tobacco Use  Smoking Status Former Smoker  . Quit date: 06/05/1984  . Years since quitting: 36.3  Smokeless Tobacco Never Used    Labs: Recent Chemical engineer    Labs for ITP Cardiac and Pulmonary Rehab Latest Ref Rng & Units 05/08/2020 07/16/2020   Cholestrol 100 - 199 mg/dL 166 257(H)   LDLCALC 0 - 99 mg/dL 103(H) 163(H)   HDL >39 mg/dL 38(L) 49   Trlycerides 0 - 149 mg/dL 126 240(H)   Hemoglobin A1c 4.8 - 5.6 % 5.7(H) -      Capillary Blood Glucose: No results found for: GLUCAP   Exercise Target Goals: Exercise Program Goal: Individual exercise prescription set using results from initial 6  min walk test and THRR while considering  patient's activity barriers and safety.   Exercise Prescription Goal: Starting with aerobic activity 30 plus minutes a day, 3 days per week for initial exercise prescription. Provide home exercise prescription and guidelines that participant acknowledges understanding prior to discharge.  Activity Barriers & Risk Stratification:  Activity Barriers & Cardiac Risk Stratification - 09/25/20 1424      Activity Barriers & Cardiac Risk Stratification   Activity Barriers History of Falls;Right Hip Replacement;Other (comment);Arthritis    Comments Fall with rigth hip fracture, s/p hip replacement. Vertigo.    Cardiac Risk Stratification High            6 Minute Walk:  6 Minute Walk    Row Name 09/25/20 1533         6 Minute Walk   Phase Initial     Distance 1075 feet     Walk Time 6 minutes     # of Rest Breaks 0     MPH 2.03     METS 2.43     RPE 11     Perceived Dyspnea  1     VO2 Peak 8.52     Symptoms Yes (comment)     Comments Patient c/o mild shortness of breath.     Resting HR 94 bpm     Resting BP 108/60     Resting Oxygen Saturation  98 %     Exercise Oxygen Saturation  during 6 min walk 98 %     Max Ex. HR 139 bpm     Max Ex. BP 159/85     2 Minute Post BP 118/82            Oxygen Initial Assessment:   Oxygen Re-Evaluation:   Oxygen Discharge (Final Oxygen Re-Evaluation):   Initial Exercise Prescription:  Initial Exercise Prescription - 09/25/20 1600      Date of Initial Exercise RX and Referring Provider   Date 09/25/20    Referring Provider Freada Bergeron, MD    Expected Discharge Date 11/23/20      NuStep   Level 2    SPM 85    Minutes 15    METs 2.2      Track   Laps 15    Minutes 15    METs 2.74      Prescription Details   Frequency (times per week) 2    Duration Progress to 30 minutes of continuous aerobic without signs/symptoms of physical distress      Intensity   THRR 40-80% of Max Heartrate 54-135    Ratings of Perceived Exertion 11-13    Perceived Dyspnea 0-4      Progression   Progression Continue to progress workloads to maintain intensity without signs/symptoms of physical distress.      Resistance Training   Training Prescription Yes    Weight 2 lbs    Reps 10-15           Perform Capillary Blood Glucose checks as needed.  Exercise Prescription Changes:  Exercise Prescription Changes    Row Name 10/03/20 1400 10/12/20 1500           Response to Exercise   Blood Pressure (Admit) 138/80 120/68      Blood Pressure (Exercise) 154/78 124/62      Blood Pressure (Exit) 118/84 118/64      Heart Rate (Admit) 98 bpm 99 bpm      Heart Rate  (Exercise) 132 bpm 135  bpm      Heart Rate (Exit) 96 bpm 98 bpm      Rating of Perceived Exertion (Exercise) 13 12      Symptoms Lighthededness None      Comments PT's first day of exercise in the CRP2 program Reviewed METs      Duration Continue with 30 min of aerobic exercise without signs/symptoms of physical distress. Continue with 30 min of aerobic exercise without signs/symptoms of physical distress.      Intensity THRR unchanged THRR unchanged             Progression   Progression Continue to progress workloads to maintain intensity without signs/symptoms of physical distress. Continue to progress workloads to maintain intensity without signs/symptoms of physical distress.      Average METs 1.2 2.2             Resistance Training   Training Prescription No No      Weight No weights on wednesdays 2 lbs      Reps -- 10-15      Time -- 10 Minutes             Interval Training   Interval Training No No             NuStep   Level 2 2      SPM 70 75      Minutes 18 23      METs 1.2 1.7             Track   Laps 3 6      Minutes 7 5      METs -- 2.74             Exercise Comments:  Exercise Comments    Row Name 10/03/20 1450 10/12/20 1450 10/16/20 1416       Exercise Comments Pt's first day of exercise in the CRP2 program. Pt had some lightheadeness after geting off the nustep: BP 127/70. Fluids provided. Pt felt better then walked track for 3 laps and stopped due to feeling that she had the wrong pair of shoes on for walking. Reviewed METs. Pt is making good progress in the CRP2 program .            Exercise Goals and Review:  Exercise Goals    Row Name 09/25/20 1424             Exercise Goals   Increase Physical Activity Yes       Intervention Provide advice, education, support and counseling about physical activity/exercise needs.;Develop an individualized exercise prescription for aerobic and resistive training based on initial evaluation findings,  risk stratification, comorbidities and participant's personal goals.       Expected Outcomes Short Term: Attend rehab on a regular basis to increase amount of physical activity.;Long Term: Exercising regularly at least 3-5 days a week.;Long Term: Add in home exercise to make exercise part of routine and to increase amount of physical activity.       Increase Strength and Stamina Yes       Intervention Provide advice, education, support and counseling about physical activity/exercise needs.;Develop an individualized exercise prescription for aerobic and resistive training based on initial evaluation findings, risk stratification, comorbidities and participant's personal goals.       Expected Outcomes Short Term: Increase workloads from initial exercise prescription for resistance, speed, and METs.;Short Term: Perform resistance training exercises routinely during rehab and add in resistance training at home;Long Term: Improve cardiorespiratory  fitness, muscular endurance and strength as measured by increased METs and functional capacity (6MWT)       Able to understand and use rate of perceived exertion (RPE) scale Yes       Intervention Provide education and explanation on how to use RPE scale       Expected Outcomes Short Term: Able to use RPE daily in rehab to express subjective intensity level;Long Term:  Able to use RPE to guide intensity level when exercising independently       Knowledge and understanding of Target Heart Rate Range (THRR) Yes       Intervention Provide education and explanation of THRR including how the numbers were predicted and where they are located for reference       Expected Outcomes Short Term: Able to state/look up THRR;Long Term: Able to use THRR to govern intensity when exercising independently;Short Term: Able to use daily as guideline for intensity in rehab       Able to check pulse independently Yes       Intervention Provide education and demonstration on how to check  pulse in carotid and radial arteries.;Review the importance of being able to check your own pulse for safety during independent exercise       Expected Outcomes Short Term: Able to explain why pulse checking is important during independent exercise;Long Term: Able to check pulse independently and accurately       Understanding of Exercise Prescription Yes       Intervention Provide education, explanation, and written materials on patient's individual exercise prescription       Expected Outcomes Short Term: Able to explain program exercise prescription;Long Term: Able to explain home exercise prescription to exercise independently              Exercise Goals Re-Evaluation :  Exercise Goals Re-Evaluation    Row Name 10/03/20 1448             Exercise Goal Re-Evaluation   Exercise Goals Review Increase Physical Activity;Increase Strength and Stamina;Able to understand and use rate of perceived exertion (RPE) scale;Knowledge and understanding of Target Heart Rate Range (THRR);Understanding of Exercise Prescription       Comments Pt's first day in the CRP2 program. Pt understands the exercise Rx, THRR and RPE scale.       Expected Outcomes Wil continue to monitor patient and progess execise workloads as tolerated.               Discharge Exercise Prescription (Final Exercise Prescription Changes):  Exercise Prescription Changes - 10/12/20 1500      Response to Exercise   Blood Pressure (Admit) 120/68    Blood Pressure (Exercise) 124/62    Blood Pressure (Exit) 118/64    Heart Rate (Admit) 99 bpm    Heart Rate (Exercise) 135 bpm    Heart Rate (Exit) 98 bpm    Rating of Perceived Exertion (Exercise) 12    Symptoms None    Comments Reviewed METs    Duration Continue with 30 min of aerobic exercise without signs/symptoms of physical distress.    Intensity THRR unchanged      Progression   Progression Continue to progress workloads to maintain intensity without signs/symptoms of  physical distress.    Average METs 2.2      Resistance Training   Training Prescription No    Weight 2 lbs    Reps 10-15    Time 10 Minutes      Interval Training  Interval Training No      NuStep   Level 2    SPM 75    Minutes 23    METs 1.7      Track   Laps 6    Minutes 5    METs 2.74           Nutrition:  Target Goals: Understanding of nutrition guidelines, daily intake of sodium 1500mg , cholesterol 200mg , calories 30% from fat and 7% or less from saturated fats, daily to have 5 or more servings of fruits and vegetables.  Biometrics:  Pre Biometrics - 09/25/20 1330      Pre Biometrics   Waist Circumference 32 inches    Hip Circumference 39 inches    Waist to Hip Ratio 0.82 %    Triceps Skinfold 22 mm    % Body Fat 36.1 %    Grip Strength 23 kg    Flexibility --   Not performed, right hip repalcement.   Single Leg Stand 15.5 seconds            Nutrition Therapy Plan and Nutrition Goals:   Nutrition Assessments:  MEDIFICTS Score Key:  ?70 Need to make dietary changes   40-70 Heart Healthy Diet  ? 40 Therapeutic Level Cholesterol Diet   Picture Your Plate Scores:  <44 Unhealthy dietary pattern with much room for improvement.  41-50 Dietary pattern unlikely to meet recommendations for good health and room for improvement.  51-60 More healthful dietary pattern, with some room for improvement.   >60 Healthy dietary pattern, although there may be some specific behaviors that could be improved.    Nutrition Goals Re-Evaluation:   Nutrition Goals Discharge (Final Nutrition Goals Re-Evaluation):   Psychosocial: Target Goals: Acknowledge presence or absence of significant depression and/or stress, maximize coping skills, provide positive support system. Participant is able to verbalize types and ability to use techniques and skills needed for reducing stress and depression.  Initial Review & Psychosocial Screening:  Initial Psych Review &  Screening - 09/26/20 0835      Initial Review   Current issues with History of Depression      Family Dynamics   Good Support System? Yes   Wilhelmine lives alone. Mayan has her Son, Daughter in [place] and signigant other for support   Comments Patient's son currently admiited to the hospital with a CVA on [place].      Barriers   Psychosocial barriers to participate in program There are no identifiable barriers or psychosocial needs.      Screening Interventions   Interventions Encouraged to exercise           Quality of Life Scores:  Quality of Life - 09/25/20 1522      Quality of Life   Select Quality of Life      Quality of Life Scores   Health/Function Pre 26.31 %    Socioeconomic Pre 27.75 %    Psych/Spiritual Pre 28.07 %    Family Pre 27 %    GLOBAL Pre 27.1 %          Scores of 19 and below usually indicate a poorer quality of life in these areas.  A difference of  2-3 points is a clinically meaningful difference.  A difference of 2-3 points in the total score of the Quality of Life Index has been associated with significant improvement in overall quality of life, self-image, physical symptoms, and general health in studies assessing change in quality of life.  PHQ-9: Recent Review Flowsheet Data    Depression screen [facility] 2/9 09/27/2020 09/26/2020 08/27/2020 08/06/2020 07/02/2020   Decreased Interest 0 0 1 1 1    Down, Depressed, Hopeless 0 0 1 1 1    PHQ - 2 Score 0 0 2 2 2    Altered sleeping - - 2 1 1    Tired, decreased energy - - 1 1 1    Change in appetite - - 1 1 0   Feeling bad or failure about yourself  - - 0 0 0   Trouble concentrating - - 0 0 -   Moving slowly or fidgety/restless - - 0 0 0   Suicidal thoughts - - 0 0 0   PHQ-9 Score - - 6 5 4    Difficult doing work/chores - - Somewhat difficult Somewhat difficult Somewhat difficult     Interpretation of Total Score  Total Score Depression Severity:  1-4 = Minimal depression, 5-9 = Mild depression, 10-14 =  Moderate depression, 15-19 = Moderately severe depression, 20-27 = Severe depression   Psychosocial Evaluation and Intervention:   Psychosocial Re-Evaluation:  Psychosocial Re-Evaluation    Row Name 10/16/20 1701             Psychosocial Re-Evaluation   Current issues with History of Depression       Comments Phyliss as not voiced any increased stressors or depression       Expected Outcomes Waneda will continue have decreased stressors upon completion of phase 2 cardiac rehab       Interventions Encouraged to attend Cardiac Rehabilitation for the exercise;Stress management education       Continue Psychosocial Services  Follow up required by staff              Psychosocial Discharge (Final Psychosocial Re-Evaluation):  Psychosocial Re-Evaluation - 10/16/20 1701      Psychosocial Re-Evaluation   Current issues with History of Depression    Comments Phyliss as not voiced any increased stressors or depression    Expected Outcomes Amika will continue have decreased stressors upon completion of phase 2 cardiac rehab    Interventions Encouraged to attend Cardiac Rehabilitation for the exercise;Stress management education    Continue Psychosocial Services  Follow up required by staff           Vocational Rehabilitation: Provide vocational rehab assistance to qualifying candidates.   Vocational Rehab Evaluation & Intervention:  Vocational Rehab - 09/26/20 1035      Initial Vocational Rehab Evaluation & Intervention   Assessment shows need for Vocational Rehabilitation No   Meigan is retired and does not need vocational rehab at this time          Education: Education Goals: Education classes will be provided on a weekly basis, covering required topics. Participant will state understanding/return demonstration of topics presented.  Learning Barriers/Preferences:  Learning Barriers/Preferences - 09/26/20 0839      Learning Barriers/Preferences   Learning Barriers  Inability to learn new things;Sight;Exercise Concerns;Hearing   Wears hearing aides, glasses dizziness memory deficit   Learning Preferences Individual Instruction;Video;Skilled Demonstration           Education Topics: Hypertension, Hypertension Reduction -Define heart disease and high blood pressure. Discus how high blood pressure affects the body and ways to reduce high blood pressure.   Exercise and Your Heart -Discuss why it is important to exercise, the FITT principles of exercise, normal and abnormal responses to exercise, and how to exercise safely.   Angina -Discuss definition of angina, causes of  angina, treatment of angina, and how to decrease risk of having angina.   Cardiac Medications -Review what the following cardiac medications are used for, how they affect the body, and side effects that may occur when taking the medications.  Medications include Aspirin, Beta blockers, calcium channel blockers, ACE Inhibitors, angiotensin receptor blockers, diuretics, digoxin, and antihyperlipidemics.   Congestive Heart Failure -Discuss the definition of CHF, how to live with CHF, the signs and symptoms of CHF, and how keep track of weight and sodium intake.   Heart Disease and Intimacy -Discus the effect sexual activity has on the heart, how changes occur during intimacy as we age, and safety during sexual activity.   Smoking Cessation / COPD -Discuss different methods to quit smoking, the health benefits of quitting smoking, and the definition of COPD.   Nutrition I: Fats -Discuss the types of cholesterol, what cholesterol does to the heart, and how cholesterol levels can be controlled.   Nutrition II: Labels -Discuss the different components of food labels and how to read food label   Heart Parts/Heart Disease and PAD -Discuss the anatomy of the heart, the pathway of blood circulation through the heart, and these are affected by heart disease.   Stress I: Signs  and Symptoms -Discuss the causes of stress, how stress may lead to anxiety and depression, and ways to limit stress.   Stress II: Relaxation -Discuss different types of relaxation techniques to limit stress.   Warning Signs of Stroke / TIA -Discuss definition of a stroke, what the signs and symptoms are of a stroke, and how to identify when someone is having stroke.   Knowledge Questionnaire Score:  Knowledge Questionnaire Score - 09/25/20 1522      Knowledge Questionnaire Score   Pre Score 23/24           Core Components/Risk Factors/Patient Goals at Admission:  Personal Goals and Risk Factors at Admission - 09/25/20 1505      Core Components/Risk Factors/Patient Goals on Admission    Weight Management Weight Gain;Yes    Intervention Weight Management: Develop a combined nutrition and exercise program designed to reach desired caloric intake, while maintaining appropriate intake of nutrient and fiber, sodium and fats, and appropriate energy expenditure required for the weight goal.;Weight Management: Provide education and appropriate resources to help participant work on and attain dietary goals.    Admit Weight 123 lb 14.4 oz (56.2 kg)    Goal Weight: Short Term 126 lb (57.2 kg)    Goal Weight: Long Term 129 lb (58.5 kg)    Expected Outcomes Short Term: Continue to assess and modify interventions until short term weight is achieved;Long Term: Adherence to nutrition and physical activity/exercise program aimed toward attainment of established weight goal;Weight Gain: Understanding of general recommendations for a high calorie, high protein meal plan that promotes weight gain by distributing calorie intake throughout the day with the consumption for 4-5 meals, snacks, and/or supplements    Heart Failure Yes    Intervention Provide a combined exercise and nutrition program that is supplemented with education, support and counseling about heart failure. Directed toward relieving  symptoms such as shortness of breath, decreased exercise tolerance, and extremity edema.    Expected Outcomes Improve functional capacity of life;Short term: Attendance in program 2-3 days a week with increased exercise capacity. Reported lower sodium intake. Reported increased fruit and vegetable intake. Reports medication compliance.;Short term: Daily weights obtained and reported for increase. Utilizing diuretic protocols set by physician.;Long term: Adoption of self-care  skills and reduction of barriers for early signs and symptoms recognition and intervention leading to self-care maintenance.    Hypertension Yes    Intervention Provide education on lifestyle modifcations including regular physical activity/exercise, weight management, moderate sodium restriction and increased consumption of fresh fruit, vegetables, and low fat dairy, alcohol moderation, and smoking cessation.;Monitor prescription use compliance.    Expected Outcomes Short Term: Continued assessment and intervention until BP is < 140/57mm HG in hypertensive participants. < 130/27mm HG in hypertensive participants with diabetes, heart failure or chronic kidney disease.;Long Term: Maintenance of blood pressure at goal levels.    Lipids Yes    Intervention Provide education and support for participant on nutrition & aerobic/resistive exercise along with prescribed medications to achieve LDL 70mg , HDL >40mg .    Expected Outcomes Short Term: Participant states understanding of desired cholesterol values and is compliant with medications prescribed. Participant is following exercise prescription and nutrition guidelines.;Long Term: Cholesterol controlled with medications as prescribed, with individualized exercise RX and with personalized nutrition plan. Value goals: LDL < 70mg , HDL > 40 mg.    Stress Yes    Intervention Offer individual and/or small group education and counseling on adjustment to heart disease, stress management and  health-related lifestyle change. Teach and support self-help strategies.;Refer participants experiencing significant psychosocial distress to appropriate mental health specialists for further evaluation and treatment. When possible, include family members and significant others in education/counseling sessions.    Expected Outcomes Short Term: Participant demonstrates changes in health-related behavior, relaxation and other stress management skills, ability to obtain effective social support, and compliance with psychotropic medications if prescribed.;Long Term: Emotional wellbeing is indicated by absence of clinically significant psychosocial distress or social isolation.    Personal Goal Other Yes    Personal Goal Be able to participate in social activities again.    Intervention Build strength and stamina, so patient can have energy to participate in social activities.    Expected Outcomes Patient will be able to participate in social activites as tolerated.           Core Components/Risk Factors/Patient Goals Review:   Goals and Risk Factor Review    Row Name 10/03/20 1459 10/16/20 1703 10/16/20 1704         Core Components/Risk Factors/Patient Goals Review   Personal Goals Review Weight Management/Obesity;Stress;Hypertension;Lipids Weight Management/Obesity Weight Management/Obesity;Heart Failure;Stress;Hypertension;Lipids     Review Sundeep started exercise on 10/03/20 and did fair with exercise. Vital signs stable -- Rane has been doing well with exercise at cardiac rehab. Patient's heart rate continues to be in th low 100's when she starts exercise which comes down after rest     Expected Outcomes Phyliis will continue to participate in phase 2 cardiac rehab for exercise nutrition and lifestyle modifications -- Monaye will continue to partcipate in phase 2 cardiac rehab for exercise, nutrition and lifestyle modifications            Core Components/Risk Factors/Patient Goals at  Discharge (Final Review):   Goals and Risk Factor Review - 10/16/20 1704      Core Components/Risk Factors/Patient Goals Review   Personal Goals Review Weight Management/Obesity;Heart Failure;Stress;Hypertension;Lipids    Review Montasia has been doing well with exercise at cardiac rehab. Patient's heart rate continues to be in th low 100's when she starts exercise which comes down after rest    Expected Outcomes Mariadejesus will continue to partcipate in phase 2 cardiac rehab for exercise, nutrition and lifestyle modifications  ITP Comments:  ITP Comments    Row Name 09/25/20 1330 10/03/20 1458 10/16/20 1657       ITP Comments Medical Director- Dr. Fransico Him, MD 30 Day ITP Review. Jerlisa started exercise on 10/03/20 and did fair with exercise 30 Day ITP Review. Lovinia has good attendance and participation in phase 2 cardiac rehab            Comments: See ITP comments.Barnet Pall, RN,BSN 10/16/2020 5:11 PM

## 2020-10-17 ENCOUNTER — Encounter (HOSPITAL_COMMUNITY)
Admission: RE | Admit: 2020-10-17 | Discharge: 2020-10-17 | Disposition: A | Discharge status: Home / Self Care | Payer: Medicare Other | Source: Ambulatory Visit | Attending: Cardiology | Admitting: Cardiology

## 2020-10-17 ENCOUNTER — Other Ambulatory Visit: Payer: Self-pay

## 2020-10-17 DIAGNOSIS — I447 Left bundle-branch block, unspecified: Secondary | ICD-10-CM | POA: Diagnosis not present

## 2020-10-17 DIAGNOSIS — Z955 Presence of coronary angioplasty implant and graft: Secondary | ICD-10-CM | POA: Diagnosis not present

## 2020-10-17 DIAGNOSIS — I214 Non-ST elevation (NSTEMI) myocardial infarction: Secondary | ICD-10-CM

## 2020-10-17 DIAGNOSIS — I440 Atrioventricular block, first degree: Secondary | ICD-10-CM | POA: Diagnosis not present

## 2020-10-18 DIAGNOSIS — Z96649 Presence of unspecified artificial hip joint: Secondary | ICD-10-CM | POA: Insufficient documentation

## 2020-10-18 DIAGNOSIS — M7061 Trochanteric bursitis, right hip: Secondary | ICD-10-CM | POA: Insufficient documentation

## 2020-10-19 ENCOUNTER — Encounter (HOSPITAL_COMMUNITY)
Admission: RE | Admit: 2020-10-19 | Discharge: 2020-10-19 | Disposition: A | Discharge status: Home / Self Care | Payer: Medicare Other | Source: Ambulatory Visit | Attending: Cardiology | Admitting: Cardiology

## 2020-10-19 ENCOUNTER — Other Ambulatory Visit: Payer: Self-pay

## 2020-10-19 DIAGNOSIS — I214 Non-ST elevation (NSTEMI) myocardial infarction: Secondary | ICD-10-CM | POA: Diagnosis not present

## 2020-10-19 DIAGNOSIS — Z955 Presence of coronary angioplasty implant and graft: Secondary | ICD-10-CM | POA: Insufficient documentation

## 2020-10-22 DIAGNOSIS — M7061 Trochanteric bursitis, right hip: Secondary | ICD-10-CM | POA: Diagnosis not present

## 2020-10-24 ENCOUNTER — Encounter (HOSPITAL_COMMUNITY)
Admission: RE | Admit: 2020-10-24 | Discharge: 2020-10-24 | Disposition: A | Discharge status: Home / Self Care | Payer: Medicare Other | Source: Ambulatory Visit | Attending: Cardiology | Admitting: Cardiology

## 2020-10-24 ENCOUNTER — Other Ambulatory Visit: Payer: Self-pay

## 2020-10-24 DIAGNOSIS — Z955 Presence of coronary angioplasty implant and graft: Secondary | ICD-10-CM

## 2020-10-24 DIAGNOSIS — I214 Non-ST elevation (NSTEMI) myocardial infarction: Secondary | ICD-10-CM

## 2020-10-24 NOTE — Progress Notes (Signed)
Patricia Garcia 87 y.o. female Nutrition Note  Diagnosis: NSTEMI  Past Medical History:  Diagnosis Date  . Anal or rectal pain   . Anemia   . Arthritis   . CAD (coronary artery disease)    a. NSTEMI 04/2020 s/p DES to [facility] and D1.  . Chronic diastolic CHF (congestive heart failure) ([place])   . Depression   . Diverticulosis of colon (without mention of hemorrhage)   . Hypertension   . Hypoalbuminemia   . Internal hemorrhoids without mention of complication   . Ischemic heart disease   . LBBB (left bundle branch block)   . Osteopenia   . Renal insufficiency   . S/P right hip fracture   . Slow transit constipation      Medications reviewed.   Current Outpatient Medications:  .  aspirin 81 MG chewable tablet, aspirin 81 mg chewable tablet, Disp: , Rfl:  .  Calcium Carbonate-Vit D-Min (CALCIUM 1200 PO), Take 1,200 mg by mouth daily., Disp: , Rfl:  .  cholecalciferol (VITAMIN D) 1000 UNITS tablet, Take 1,000 Units by mouth daily., Disp: , Rfl:  .  clopidogrel (PLAVIX) 75 MG tablet, Take 1 tablet (75 mg total) by mouth daily., Disp: 90 tablet, Rfl: 3 .  docusate sodium (COLACE) 100 MG capsule, Take 100 mg by mouth 3 (three) times a week., Disp: , Rfl:  .  esomeprazole (NEXIUM) 40 MG capsule, Take 1 capsule (40 mg total) by mouth daily., Disp: 90 capsule, Rfl: 1 .  ferrous sulfate 325 (65 FE) MG tablet, Take 325 mg by mouth See admin instructions. 3 times a week, Disp: , Rfl:  .  latanoprost (XALATAN) 0.005 % ophthalmic solution, Place 1 drop into both eyes at bedtime., Disp: , Rfl:  .  multivitamin-iron-minerals-folic acid (CENTRUM) chewable tablet, Chew 1 tablet by mouth daily., Disp: , Rfl:  .  Naphazoline-Pheniramine (OPCON-A OP), Place 1 drop into both eyes daily as needed (for eye allergy)., Disp: , Rfl:  .  Omega-3 Fatty Acids (FISH OIL) 1000 MG CAPS, Take 1,000 mg by mouth daily., Disp: , Rfl:  .  sodium chloride (MURO 128) 2 % ophthalmic solution, Place 1 drop into both eyes  2 (two) times daily as needed for eye irritation., Disp: , Rfl:    Ht Readings from Last 1 Encounters:  10/15/20 5\' 1"  (1.549 m)     Wt Readings from Last 3 Encounters:  10/15/20 123 lb 3.2 oz (55.9 kg)  09/26/20 124 lb (56.2 kg)  09/25/20 123 lb 14.4 oz (56.2 kg)     There is no height or weight on file to calculate BMI.   Social History   Tobacco Use  Smoking Status Former Smoker  . Quit date: 06/05/1984  . Years since quitting: 36.4  Smokeless Tobacco Never Used     Lab Results  Component Value Date   CHOL 257 (H) 07/16/2020   Lab Results  Component Value Date   HDL 49 07/16/2020   Lab Results  Component Value Date   LDLCALC 163 (H) 07/16/2020   Lab Results  Component Value Date   TRIG 240 (H) 07/16/2020     Lab Results  Component Value Date   HGBA1C 5.7 (H) 05/08/2020     CBG (last 3)  No results for input(s): GLUCAP in the last 72 hours.   Nutrition Note  Spoke with pt. Nutrition Plan and Nutrition Survey goals reviewed with pt.  Pt has Pre-diabetes. Last A1c indicates blood glucose well-controlled.  Pt lost  9.3% body weight after hip surgery and STEMI/DES. She reports continued poor appetite. She also reports depression which she has discussed with her MD. She reports social isolation over past year or so with hospitalizations and pandemic. She tries to eat 3 meals per day. Often she has a Boost for one meal. She eats very light meals.  She likes to eat out but has no one to eat out with anymore. She does not want to cook for just herself. We discussed ways to incorporate lower sodium frozen meals. Reviewed ways to increase healthy fats to increase caloric intake.  Diet recall: Breakfast: 1 egg and sliced tomatoes Lunch: Chicken salad, cucumbers, salad OR Boost OR light snack Dinner: Soup OR veggies from [employer], OR takeout  Pt expressed understanding of the information reviewed.   Nutrition Diagnosis ? Unintentional wt loss related to poor appetite  and decreased food intake S/P Hip surgery and STEMI as evidenced by wt loss of 9.3% after surgery an pt report of continued poor appetite.  Nutrition Intervention ? Pt's individual nutrition plan reviewed with pt. ? Benefits of adopting Heart Healthy diet discussed when Picture Your Plate reviewed. ? Continue client-centered nutrition education by RD, as part of interdisciplinary care.  Goal(s) ? Pt to identify food quantities necessary to achieve weight gain of 0.5 lbs per week lb while in cardiac rehab.  ? Pt to build a healthy plate including vegetables, fruits, whole grains, and low-fat dairy products in a heart healthy meal plan. ? Add healthy fats to meals and shakes   Plan:   Will provide client-centered nutrition education as part of interdisciplinary care  Monitor and evaluate progress toward nutrition goal with team.   Michaele Offer, MS, RDN, LDN

## 2020-10-25 ENCOUNTER — Encounter: Payer: Self-pay | Admitting: *Deleted

## 2020-10-25 ENCOUNTER — Other Ambulatory Visit: Payer: Self-pay | Admitting: *Deleted

## 2020-10-25 NOTE — Patient Outreach (Signed)
Patricia Garcia) Care Management  10/25/2020  Patricia Garcia June 22, 1933 [id number]  [name] outreach to complex care patient with case closure   Referral Date:05/11/20 Referral Source:Patricia Garcia  [facility] RN Garcia hospital liaison Referral Reason:Please assign to [place] Coordinator for complex care PCP does the [facility] follow up and disease management follow up calls and assess for further needs  Insurance:NextGen Medicareandblue cross and blue shieldsupplement  Last admission 05/06/20 to 10/21/21Non ST elevation myocardial infarction (NSTEMI), CHF  05/02/20 to 10/15/21right femoral fractures/pTHA by Patricia. Swinteckd/c   Patient is able to verify HIPAA ([place]) identifiers Reviewed and addressed the purpose of the follow up call with the patient  Consent: Patricia([facility]) RN Garcia reviewed [place] with patient. Patient gave verbal consent for services.   Follow up Patricia Garcia reports she is doing [place] In 4 th [facility], attending Wednesdays and Fridays Met very nice people Able to walk faster without an assistive device  Walks on the treadmill for 20 minutes and around the track 8 times  Lifts 2 lb weights over her head Can climb stairs She has to rest at intervals and monitor her heart rate She intends to continue walking after cardiac rehab sessions are finish  Spoke with dietitian on Tuesday 10/23/20 saw Patricia Garcia on Monday 10/22/20 Loss a lot of weight (wt) just getting her appetite back  Hypertension (HTN) BP much better "it is wonderful" 110/60 now only on Plavix  At the beginning of cardiac rehab sessions she may have an elevation of heart weight  Coping Reports she is no longer having crying episode Teeth preventive care Patricia Garcia implants for her teeth are scheduled Past Medical History:  Diagnosis Date  . Anal or rectal pain   . Anemia   . Arthritis   . CAD  (coronary artery disease)    a. NSTEMI 04/2020 s/p DES to [facility] and D1.  . Chronic diastolic CHF (congestive heart failure) ([place])   . Depression   . Diverticulosis of colon (without mention of hemorrhage)   . Hypertension   . Hypoalbuminemia   . Internal hemorrhoids without mention of complication   . Ischemic heart disease   . LBBB (left bundle branch block)   . Osteopenia   . Renal insufficiency   . S/P right hip fracture   . Slow transit constipation    Patient Active Problem List   Diagnosis Date Noted  . History of total hip arthroplasty 10/18/2020  . Trochanteric bursitis of right hip 10/18/2020  . Mild major depression, single episode ([place]) 09/26/2020  . Pure hypercholesterolemia 09/26/2020  . Pain of right hip joint 08/07/2020  . Acute non-ST segment elevation myocardial infarction ([place]) 08/02/2020  . Acute on chronic diastolic heart failure ([place]) 08/02/2020  . Allergic rhinitis due to pollen 08/02/2020  . Arteriosclerotic heart disease 08/02/2020  . Chronic kidney disease, stage 3a ([place]) 08/02/2020  . Dyslipidemia 08/02/2020  . Hardening of the aorta (main artery of the heart) ([place]) 08/02/2020  . Hearing loss 08/02/2020  . Hypertensive renal disease 08/02/2020  . Hypertensive heart failure ([place]) 08/02/2020  . Intestinal malabsorption 08/02/2020  . Ischemic cardiomyopathy 08/02/2020  . Primary localized osteoarthritis of pelvic region and thigh 08/02/2020  . Spondylosis without myelopathy 08/02/2020  . NSTEMI (non-ST elevated myocardial infarction) ([place]) 05/07/2020  . Postoperative anemia 05/07/2020  . Acute exacerbation of CHF (congestive heart failure) ([place]) 05/06/2020  . Acute respiratory failure with hypoxia ([place])  05/06/2020  . LBBB (left bundle branch block) 05/06/2020  . Sinus congestion 05/06/2020  . Femur fracture, right ([place]) 04/30/2020  . Fracture of neck of femur ([place]) 04/30/2020  . Chronic abdominal pain 09/23/2011  . Gas pain 09/23/2011  .  Constipation, slow transit 09/23/2011  . Anorectal pain 09/23/2011  . GASTRITIS 06/26/2010  . Slow transit constipation 06/21/2010  . RECTAL BLEEDING 06/21/2010  . Vitamin D deficiency 06/20/2010  . Generalized anxiety disorder 06/20/2010  . GLAUCOMA 06/20/2010  . Cataract 06/20/2010  . Benign essential hypertension 06/20/2010  . Chronic sinusitis 06/20/2010  . GERD 06/20/2010  . Disorder of skeletal system 06/20/2010  . Abdominal pain 06/20/2010  . MICROALBUMINURIA 06/20/2010  . Gastroesophageal reflux disease 06/20/2010     Discussed Patricia progression and offered Patricia disease management   Patricia Garcia care coordination  Completed a spoke Patricia Garcia at [phone] at [place] [facility] pharmacy services  They will send a return label and [employer] will pick it up to return unused bottles  Plans Patient agrees to care plan and follow up within the next 30-45 business days Pt encouraged to return a call to [place] Garcia prn Goals Addressed              This Visit's Progress     Patient Stated   .  COMPLETED: [facility]) Track and Manage Symptoms (pt-stated)   On track     Case closed 10/25/20   - eat more whole grains, fruits and vegetables, lean meats and healthy fats - follow rescue plan if symptoms flare-up - know when to call the doctor - track symptoms and what helps feel better or worse - dress right for the weather, hot or cold    Notes: Goal met 10/25/20 Pt monitoring and managing all symptoms as home, eating better, attending cardiac rehab with excellent success 09/26/20 She reports she is feeling much better after discontinued Brilinta No further medication side effects voiced She is sleeping better and breathing better She is driving herself to her appointments and short trips 08/10/20  She reports she still feels she is having symptoms (dizziness, poor appetite, poor sleep from [name] and used xanax last night to get better sleep 08/02/20 monitoring symptoms,  starting outpatient rehab on 08/07/20 for hip mobility       Patricia Garcia L. Lavina Hamman, RN, BSN, [place] Coordinator Office number [phone] [facility] number [phone] Fax number [phone]

## 2020-10-26 ENCOUNTER — Other Ambulatory Visit: Payer: Self-pay

## 2020-10-26 ENCOUNTER — Encounter (HOSPITAL_COMMUNITY)
Admission: RE | Admit: 2020-10-26 | Discharge: 2020-10-26 | Disposition: A | Discharge status: Home / Self Care | Payer: Medicare Other | Source: Ambulatory Visit | Attending: Cardiology | Admitting: Cardiology

## 2020-10-26 DIAGNOSIS — Z955 Presence of coronary angioplasty implant and graft: Secondary | ICD-10-CM | POA: Diagnosis not present

## 2020-10-26 DIAGNOSIS — I214 Non-ST elevation (NSTEMI) myocardial infarction: Secondary | ICD-10-CM

## 2020-10-29 ENCOUNTER — Telehealth: Payer: Self-pay | Admitting: Cardiology

## 2020-10-29 NOTE — Telephone Encounter (Signed)
Spoke with Dr. Johney Frame about the pts complaints and she states that she does NOT think the red and itchy areas are associated with taking Plavix, and advises her to continue this regimen, and continue using the hydrocortisone cream to the affected areas.  Dr. Johney Frame advised that the areas are probably coming from her electrodes she uses at rehab, and they should use the sensitive electrodes on her, next time she attends rehab.  Informed the pt that I will send this encounter to Cardiac Rehab, so they can reference this and know to use the sensitive electrodes.  Advised the pt that our pre-op Providers already advised on her clearance note for upcoming dental work she will be receiving.  Coletta Memos NP advised on clearance and faxed this back to Dr. Sherrilee Gilles DDS via epic fax function    Pt verbalized understanding and agrees with this plan.

## 2020-10-29 NOTE — Telephone Encounter (Signed)
Pt c/o medication issue:  1. Name of Medication: clopidogrel (PLAVIX) 75 MG tablet  2. How are you currently taking this medication (dosage and times per day)? 1 tablet a day  3. Are you having a reaction (difficulty breathing--STAT)? Yes  4. What is your medication issue? Patient stated that she started taking the medication on Friday, that's when she started itching. Patient stated day by day the itching is getting worse. Patient also notice that she gotten 3 big black bruises on her body since taking the medication. Also getting red patches all over as well. Please advise   Patient also wants to confirmation to see if she needs to stop taking the plavix prior to dentist producer on next month

## 2020-10-29 NOTE — Telephone Encounter (Signed)
Pt was calling in for 2 reasons.  One was to inform Dr. Johney Frame that she is having a reaction and itchy skin, and the other is to follow-up on clearance form sent by her Dentist, to have a procedure done in the near future.   Pt states that she has noticed red areas on her chest area and under her breast, that are itchy, and is asking if this is caused by taking Plavix.  Pt has been on Plavix since Feb 2022, and has had no complaints with this med thus far.  She complains of no sob, hives, swelling to her tongue or throat.  She states she has not changed any detergents or soaps, and is not taking any new meds.  Pt states she does notice that when she attends cardiac rehab weekly, the electrodes they use do cause her skin irritation and redness, and she thinks she may be allergic to those.  She states she is currently putting hydrocortisone cream on the areas where the electrodes were, and this alleviates the itchy.  Pt states the itchy and red areas are located on the same spots where the electrodes are placed.  Pt would like for Dr. Johney Frame to advise on this.    Pt also states her DDS faxed over a clearance form for our Pre-op Providers to advise on stopping her plavix before her procedure. I do see this in her chart and I see where Coletta Memos NP completed this and faxed this back to her DDS, Dr. Rayann Heman via epic fax function.   Informed the pt this is complete and already sent back to her DDS.   Informed the pt that I will run her other concern by Dr. Johney Frame to further advise on, and follow-up with the pt accordingly thereafter.  Pt verbalized understanding and agrees with this plan.

## 2020-10-30 ENCOUNTER — Telehealth (HOSPITAL_COMMUNITY): Payer: Self-pay | Admitting: *Deleted

## 2020-10-30 NOTE — Telephone Encounter (Signed)
-----   Message from Freada Bergeron, MD sent at 10/30/2020 12:31 PM EDT ----- Regarding: RE: Skin irritation from ECG electrodes That is perfect. We will reiterate this plan as well.  ----- Message ----- From: Magda Kiel, RN Sent: 10/30/2020  11:26 AM EDT To: Nuala Alpha, LPN, Freada Bergeron, MD Subject: Skin irritation from ECG electrodes            Good morning Dr Johney Frame,  We checked with materials management and the hospital does not  have hypoallergenic patches. The ECG electrodes are the same ones that are used for inpatients. [place] comes twice a week. I will encourage her to continue to use hydrocortisone to the affected areas and to wipe the area with soap and water prior to leaving cardiac rehab on her exercise days. Please let us know if you have any further recommendations.  Respectfully, Barnet Pall RN Cardiac Rehab

## 2020-10-30 NOTE — Telephone Encounter (Signed)
-----   Message from Magda Kiel, RN sent at 10/30/2020 10:55 AM EDT ----- Regarding: Skin irritation from ECG electrodes Good morning Dr Johney Frame,  We checked with materials management and the hospital does not  have hypoallergenic patches. The ECG electrodes are the same ones that are used for inpatients. [place] comes twice a week. I will encourage her to continue to use hydrocortisone to the affected areas and to wipe the area with soap and water prior to leaving cardiac rehab on her exercise days. Please let us know if you have any further recommendations.  Respectfully, Barnet Pall RN Cardiac Rehab

## 2020-10-31 ENCOUNTER — Telehealth (HOSPITAL_COMMUNITY): Payer: Self-pay | Admitting: Geriatric Medicine

## 2020-10-31 ENCOUNTER — Encounter (HOSPITAL_COMMUNITY): Payer: Medicare Other

## 2020-11-02 ENCOUNTER — Other Ambulatory Visit: Payer: Self-pay

## 2020-11-02 ENCOUNTER — Encounter (HOSPITAL_COMMUNITY)
Admission: RE | Admit: 2020-11-02 | Discharge: 2020-11-02 | Disposition: A | Discharge status: Home / Self Care | Payer: Medicare Other | Source: Ambulatory Visit | Attending: Cardiology | Admitting: Cardiology

## 2020-11-02 DIAGNOSIS — I214 Non-ST elevation (NSTEMI) myocardial infarction: Secondary | ICD-10-CM

## 2020-11-02 DIAGNOSIS — Z955 Presence of coronary angioplasty implant and graft: Secondary | ICD-10-CM | POA: Diagnosis not present

## 2020-11-02 NOTE — Progress Notes (Signed)
Patricia Garcia returned to exercise at cardiac rehab today. Different ECG patches were used today. Patient has a resolving micropapular rash to her chest and posterior lower back which she has applied hydrocortisone cream to. [name] says she will exercise at a light level. Advised patient to follow up with her her primary care physician Dr Felipa Eth if her rash persists. Patient states understanding.Barnet Pall, RN,BSN 11/02/2020 1:39 PM

## 2020-11-06 ENCOUNTER — Other Ambulatory Visit: Payer: Self-pay

## 2020-11-06 NOTE — Progress Notes (Signed)
Reviewed home exercise Rx with patient today. Pt voices she is walking at home in her driveway and cul-de-sac. She walks for 10-15 minutes. Encouraged patient to try to add a second walk later in the day for a total of 30 minutes. Pt is agreeable to try this. She voices she carries her house phone and cell on her walks for safety. Encouraged a warm-up, cool-down, and stretching with her walks. Hydration was encouraged. We reviewed her THRR of 54-135 and keeping her RPE between 11-13. Weather parameters for temperature and humidity reviewed for safe exercise outdoors. Reviewed S/S to terminate exercise and when to call MD vs 911. Pt verbalized understandingof the home exercise Rx and was provided a copy.  Patricia Rubenstein MS, ACSM-CEP, CCRP

## 2020-11-07 ENCOUNTER — Other Ambulatory Visit: Payer: Self-pay

## 2020-11-07 ENCOUNTER — Encounter (HOSPITAL_COMMUNITY)
Admission: RE | Admit: 2020-11-07 | Discharge: 2020-11-07 | Disposition: A | Discharge status: Home / Self Care | Payer: Medicare Other | Source: Ambulatory Visit | Attending: Cardiology | Admitting: Cardiology

## 2020-11-07 DIAGNOSIS — Z955 Presence of coronary angioplasty implant and graft: Secondary | ICD-10-CM | POA: Diagnosis not present

## 2020-11-07 DIAGNOSIS — I214 Non-ST elevation (NSTEMI) myocardial infarction: Secondary | ICD-10-CM | POA: Diagnosis not present

## 2020-11-08 ENCOUNTER — Telehealth: Payer: Self-pay | Admitting: *Deleted

## 2020-11-08 NOTE — Telephone Encounter (Signed)
I s/w dental office today in regards to our office received a 2nd request. Dental office apologized and said they did receive clearance back in march.

## 2020-11-09 ENCOUNTER — Other Ambulatory Visit: Payer: Self-pay

## 2020-11-09 ENCOUNTER — Encounter (HOSPITAL_COMMUNITY)
Admission: RE | Admit: 2020-11-09 | Discharge: 2020-11-09 | Disposition: A | Discharge status: Home / Self Care | Payer: Medicare Other | Source: Ambulatory Visit | Attending: Cardiology | Admitting: Cardiology

## 2020-11-09 DIAGNOSIS — Z955 Presence of coronary angioplasty implant and graft: Secondary | ICD-10-CM | POA: Diagnosis not present

## 2020-11-09 DIAGNOSIS — I214 Non-ST elevation (NSTEMI) myocardial infarction: Secondary | ICD-10-CM | POA: Diagnosis not present

## 2020-11-13 NOTE — Progress Notes (Signed)
Cardiac Individual Treatment Plan  Patient Details  Name: Patricia Garcia MRN: [id number] Date of Birth: Feb 22, 1933 Referring Provider:   Flowsheet Row CARDIAC REHAB PHASE II ORIENTATION from 09/25/2020 in [place]  Referring Provider Freada Bergeron, MD      Initial Encounter Date:  [place] PHASE II ORIENTATION from 09/25/2020 in [place]  Date 09/25/20      Visit Diagnosis: NSTEMI 05/06/20  05/08/20 S/P DES RCA and Diagonal  Patient's Home Medications on Admission:  Current Outpatient Medications:  .  aspirin 81 MG chewable tablet, aspirin 81 mg chewable tablet, Disp: , Rfl:  .  Calcium Carbonate-Vit D-Min (CALCIUM 1200 PO), Take 1,200 mg by mouth daily., Disp: , Rfl:  .  cholecalciferol (VITAMIN D) 1000 UNITS tablet, Take 1,000 Units by mouth daily., Disp: , Rfl:  .  clopidogrel (PLAVIX) 75 MG tablet, Take 1 tablet (75 mg total) by mouth daily., Disp: 90 tablet, Rfl: 3 .  docusate sodium (COLACE) 100 MG capsule, Take 100 mg by mouth 3 (three) times a week., Disp: , Rfl:  .  esomeprazole (NEXIUM) 40 MG capsule, Take 1 capsule (40 mg total) by mouth daily., Disp: 90 capsule, Rfl: 1 .  ferrous sulfate 325 (65 FE) MG tablet, Take 325 mg by mouth See admin instructions. 3 times a week, Disp: , Rfl:  .  latanoprost (XALATAN) 0.005 % ophthalmic solution, Place 1 drop into both eyes at bedtime., Disp: , Rfl:  .  losartan (COZAAR) 50 MG tablet, losartan 50 mg tablet  Take 1 tablet (50 mg total) by mouth daily., Disp: , Rfl:  .  multivitamin-iron-minerals-folic acid (CENTRUM) chewable tablet, Chew 1 tablet by mouth daily., Disp: , Rfl:  .  Naphazoline-Pheniramine (OPCON-A OP), Place 1 drop into both eyes daily as needed (for eye allergy)., Disp: , Rfl:  .  Omega-3 Fatty Acids (FISH OIL) 1000 MG CAPS, Take 1,000 mg by mouth daily., Disp: , Rfl:  .  sodium chloride (MURO 128) 2 % ophthalmic solution,  Place 1 drop into both eyes 2 (two) times daily as needed for eye irritation., Disp: , Rfl:   Past Medical History: Past Medical History:  Diagnosis Date  . Anal or rectal pain   . Anemia   . Arthritis   . CAD (coronary artery disease)    a. NSTEMI 04/2020 s/p DES to [facility] and D1.  . Chronic diastolic CHF (congestive heart failure) ([place])   . Depression   . Diverticulosis of colon (without mention of hemorrhage)   . Hypertension   . Hypoalbuminemia   . Internal hemorrhoids without mention of complication   . Ischemic heart disease   . LBBB (left bundle branch block)   . Osteopenia   . Renal insufficiency   . S/P right hip fracture   . Slow transit constipation     Tobacco Use: Social History   Tobacco Use  Smoking Status Former Smoker  . Quit date: 06/05/1984  . Years since quitting: 36.4  Smokeless Tobacco Never Used    Labs: Recent Chemical engineer    Labs for ITP Cardiac and Pulmonary Rehab Latest Ref Rng & Units 05/08/2020 07/16/2020   Cholestrol 100 - 199 mg/dL 166 257(H)   LDLCALC 0 - 99 mg/dL 103(H) 163(H)   HDL >39 mg/dL 38(L) 49   Trlycerides 0 - 149 mg/dL 126 240(H)   Hemoglobin A1c 4.8 - 5.6 % 5.7(H) -  Capillary Blood Glucose: No results found for: GLUCAP   Exercise Target Goals: Exercise Program Goal: Individual exercise prescription set using results from initial 6 min walk test and THRR while considering  patient's activity barriers and safety.   Exercise Prescription Goal: Starting with aerobic activity 30 plus minutes a day, 3 days per week for initial exercise prescription. Provide home exercise prescription and guidelines that participant acknowledges understanding prior to discharge.  Activity Barriers & Risk Stratification:  Activity Barriers & Cardiac Risk Stratification - 09/25/20 1424      Activity Barriers & Cardiac Risk Stratification   Activity Barriers History of Falls;Right Hip Replacement;Other (comment);Arthritis     Comments Fall with rigth hip fracture, s/p hip replacement. Vertigo.    Cardiac Risk Stratification High           6 Minute Walk:  6 Minute Walk    Row Name 09/25/20 1533         6 Minute Walk   Phase Initial     Distance 1075 feet     Walk Time 6 minutes     # of Rest Breaks 0     MPH 2.03     METS 2.43     RPE 11     Perceived Dyspnea  1     VO2 Peak 8.52     Symptoms Yes (comment)     Comments Patient c/o mild shortness of breath.     Resting HR 94 bpm     Resting BP 108/60     Resting Oxygen Saturation  98 %     Exercise Oxygen Saturation  during 6 min walk 98 %     Max Ex. HR 139 bpm     Max Ex. BP 159/85     2 Minute Post BP 118/82            Oxygen Initial Assessment:   Oxygen Re-Evaluation:   Oxygen Discharge (Final Oxygen Re-Evaluation):   Initial Exercise Prescription:  Initial Exercise Prescription - 09/25/20 1600      Date of Initial Exercise RX and Referring Provider   Date 09/25/20    Referring Provider Freada Bergeron, MD    Expected Discharge Date 11/23/20      NuStep   Level 2    SPM 85    Minutes 15    METs 2.2      Track   Laps 15    Minutes 15    METs 2.74      Prescription Details   Frequency (times per week) 2    Duration Progress to 30 minutes of continuous aerobic without signs/symptoms of physical distress      Intensity   THRR 40-80% of Max Heartrate 54-135    Ratings of Perceived Exertion 11-13    Perceived Dyspnea 0-4      Progression   Progression Continue to progress workloads to maintain intensity without signs/symptoms of physical distress.      Resistance Training   Training Prescription Yes    Weight 2 lbs    Reps 10-15           Perform Capillary Blood Glucose checks as needed.  Exercise Prescription Changes:  Exercise Prescription Changes    Row Name 10/03/20 1400 10/12/20 1500 11/02/20 1445         Response to Exercise   Blood Pressure (Admit) 138/80 120/68 122/66     Blood  Pressure (Exercise) 154/78 124/62 108/72     Blood Pressure (Exit)  118/84 118/64 128/68     Heart Rate (Admit) 98 bpm 99 bpm 98 bpm     Heart Rate (Exercise) 132 bpm 135 bpm 140 bpm     Heart Rate (Exit) 96 bpm 98 bpm 98 bpm     Rating of Perceived Exertion (Exercise) 13 12 11      Symptoms Lighthededness None None     Comments PT's first day of exercise in the CRP2 program Reviewed METs Reviewed METs, Goals, and Home exercise Rx     Duration Continue with 30 min of aerobic exercise without signs/symptoms of physical distress. Continue with 30 min of aerobic exercise without signs/symptoms of physical distress. Continue with 30 min of aerobic exercise without signs/symptoms of physical distress.     Intensity THRR unchanged THRR unchanged THRR unchanged           Progression   Progression Continue to progress workloads to maintain intensity without signs/symptoms of physical distress. Continue to progress workloads to maintain intensity without signs/symptoms of physical distress. Continue to progress workloads to maintain intensity without signs/symptoms of physical distress.     Average METs 1.2 2.2 2.2           Resistance Training   Training Prescription No No Yes     Weight No weights on wednesdays 2 lbs 2 lbs     Reps -- 10-15 10-15     Time -- 10 Minutes 10 Minutes           Interval Training   Interval Training No No No           NuStep   Level 2 2 2      SPM 70 75 75     Minutes 18 23 17      METs 1.2 1.7 1.6           Track   Laps 3 6 10      Minutes 7 5 10      METs -- 2.74 2.74           Home Exercise Plan   Plans to continue exercise at -- -- Home (comment)     Frequency -- -- Add 2 additional days to program exercise sessions.     Initial Home Exercises Provided -- -- 11/02/20            Exercise Comments:  Exercise Comments    Row Name 10/03/20 1450 10/12/20 1450 10/16/20 1416 11/02/20 1445     Exercise Comments Pt's first day of exercise in the CRP2  program. Pt had some lightheadeness after geting off the nustep: BP 127/70. Fluids provided. Pt felt better then walked track for 3 laps and stopped due to feeling that she had the wrong pair of shoes on for walking. Reviewed METs. Pt is making good progress in the CRP2 program . Reviewed METs. Goals and home exercise Rx. Pt voices that she is making progress and has more energy and endurnace. She voices feeling stronger and that her housework does not make her as tired as before staring the exercise. Pt voices motivation for the exercise ans walks at home in her driveway and cul-de-sac. She voices she carries her cell phone and house phone for safety.           Exercise Goals and Review:  Exercise Goals    Row Name 09/25/20 1424             Exercise Goals   Increase Physical Activity Yes  Intervention Provide advice, education, support and counseling about physical activity/exercise needs.;Develop an individualized exercise prescription for aerobic and resistive training based on initial evaluation findings, risk stratification, comorbidities and participant's personal goals.       Expected Outcomes Short Term: Attend rehab on a regular basis to increase amount of physical activity.;Long Term: Exercising regularly at least 3-5 days a week.;Long Term: Add in home exercise to make exercise part of routine and to increase amount of physical activity.       Increase Strength and Stamina Yes       Intervention Provide advice, education, support and counseling about physical activity/exercise needs.;Develop an individualized exercise prescription for aerobic and resistive training based on initial evaluation findings, risk stratification, comorbidities and participant's personal goals.       Expected Outcomes Short Term: Increase workloads from initial exercise prescription for resistance, speed, and METs.;Short Term: Perform resistance training exercises routinely during rehab and add in  resistance training at home;Long Term: Improve cardiorespiratory fitness, muscular endurance and strength as measured by increased METs and functional capacity (6MWT)       Able to understand and use rate of perceived exertion (RPE) scale Yes       Intervention Provide education and explanation on how to use RPE scale       Expected Outcomes Short Term: Able to use RPE daily in rehab to express subjective intensity level;Long Term:  Able to use RPE to guide intensity level when exercising independently       Knowledge and understanding of Target Heart Rate Range (THRR) Yes       Intervention Provide education and explanation of THRR including how the numbers were predicted and where they are located for reference       Expected Outcomes Short Term: Able to state/look up THRR;Long Term: Able to use THRR to govern intensity when exercising independently;Short Term: Able to use daily as guideline for intensity in rehab       Able to check pulse independently Yes       Intervention Provide education and demonstration on how to check pulse in carotid and radial arteries.;Review the importance of being able to check your own pulse for safety during independent exercise       Expected Outcomes Short Term: Able to explain why pulse checking is important during independent exercise;Long Term: Able to check pulse independently and accurately       Understanding of Exercise Prescription Yes       Intervention Provide education, explanation, and written materials on patient's individual exercise prescription       Expected Outcomes Short Term: Able to explain program exercise prescription;Long Term: Able to explain home exercise prescription to exercise independently              Exercise Goals Re-Evaluation :  Exercise Goals Re-Evaluation    [place] Name 10/03/20 1448 11/02/20 1445           Exercise Goal Re-Evaluation   Exercise Goals Review Increase Physical Activity;Increase Strength and Stamina;Able  to understand and use rate of perceived exertion (RPE) scale;Knowledge and understanding of Target Heart Rate Range (THRR);Understanding of Exercise Prescription Increase Physical Activity;Increase Strength and Stamina;Able to understand and use rate of perceived exertion (RPE) scale;Knowledge and understanding of Target Heart Rate Range (THRR);Understanding of Exercise Prescription      Comments Pt's first day in the CRP2 program. Pt understands the exercise Rx, THRR and RPE scale. Reviewed METs, goals, and Home exercise Rx with patient today.  Pt voices she is walking at home in her driveway and cul-de-sac already.      Expected Outcomes Wil continue to monitor patient and progess execise workloads as tolerated. Pt will continue to walk at home.              Discharge Exercise Prescription (Final Exercise Prescription Changes):  Exercise Prescription Changes - 11/02/20 1445      Response to Exercise   Blood Pressure (Admit) 122/66    Blood Pressure (Exercise) 108/72    Blood Pressure (Exit) 128/68    Heart Rate (Admit) 98 bpm    Heart Rate (Exercise) 140 bpm    Heart Rate (Exit) 98 bpm    Rating of Perceived Exertion (Exercise) 11    Symptoms None    Comments Reviewed METs, Goals, and Home exercise Rx    Duration Continue with 30 min of aerobic exercise without signs/symptoms of physical distress.    Intensity THRR unchanged      Progression   Progression Continue to progress workloads to maintain intensity without signs/symptoms of physical distress.    Average METs 2.2      Resistance Training   Training Prescription Yes    Weight 2 lbs    Reps 10-15    Time 10 Minutes      Interval Training   Interval Training No      NuStep   Level 2    SPM 75    Minutes 17    METs 1.6      Track   Laps 10    Minutes 10    METs 2.74      Home Exercise Plan   Plans to continue exercise at Home (comment)    Frequency Add 2 additional days to program exercise sessions.    Initial  Home Exercises Provided 11/02/20           Nutrition:  Target Goals: Understanding of nutrition guidelines, daily intake of sodium 1500mg , cholesterol 200mg , calories 30% from fat and 7% or less from saturated fats, daily to have 5 or more servings of fruits and vegetables.  Biometrics:  Pre Biometrics - 09/25/20 1330      Pre Biometrics   Waist Circumference 32 inches    Hip Circumference 39 inches    Waist to Hip Ratio 0.82 %    Triceps Skinfold 22 mm    % Body Fat 36.1 %    Grip Strength 23 kg    Flexibility --   Not performed, right hip repalcement.   Single Leg Stand 15.5 seconds            Nutrition Therapy Plan and Nutrition Goals:  Nutrition Therapy & Goals - 10/24/20 1459      Nutrition Therapy   Diet High calorie, high protein      Personal Nutrition Goals   Nutrition Goal Pt to identify food quantities necessary to achieve weight gain of 0.5 lbs per week lb while in cardiac rehab    Personal Goal #2 Pt to build a healthy plate including vegetables, fruits, whole grains, and low-fat dairy products in a heart healthy meal plan.    Personal Goal #3 Add healthy fats to meals and shakes      Intervention Plan   Intervention Prescribe, educate and counsel regarding individualized specific dietary modifications aiming towards targeted core components such as weight, hypertension, lipid management, diabetes, heart failure and other comorbidities.;Nutrition handout(s) given to patient.    Expected Outcomes Short Term Goal: Understand  basic principles of dietary content, such as calories, fat, sodium, cholesterol and nutrients.;Long Term Goal: Adherence to prescribed nutrition plan.           Nutrition Assessments:  MEDIFICTS Score Key:  ?70 Need to make dietary changes   40-70 Heart Healthy Diet  ? 40 Therapeutic Level Cholesterol Diet  Flowsheet Row CARDIAC REHAB PHASE II EXERCISE from 10/24/2020 in [place]  Picture Your  Plate Total Score on Admission 63     Picture Your Plate Scores:  [id number] Unhealthy dietary pattern with much room for improvement.  41-50 Dietary pattern unlikely to meet recommendations for good health and room for improvement.  51-60 More healthful dietary pattern, with some room for improvement.   >60 Healthy dietary pattern, although there may be some specific behaviors that could be improved.    Nutrition Goals Re-Evaluation:  Nutrition Goals Re-Evaluation    [place] Name 10/24/20 1501 11/09/20 1517           Goals   Current Weight 123 lb (55.8 kg) 122 lb 12.7 oz (55.7 kg)      Nutrition Goal Pt to identify food quantities necessary to achieve weight gain of 0.5 lbs per week lb while in cardiac rehab Pt to identify food quantities necessary to achieve weight gain of 0.5 lbs per week lb while in cardiac rehab             Personal Goal #2 Re-Evaluation   Personal Goal #2 Pt to build a healthy plate including vegetables, fruits, whole grains, and low-fat dairy products in a heart healthy meal plan. Pt to build a healthy plate including vegetables, fruits, whole grains, and low-fat dairy products in a heart healthy meal plan.             Personal Goal #3 Re-Evaluation   Personal Goal #3 Add healthy fats to meals and shakes Add healthy fats to meals and shakes             Nutrition Goals Discharge (Final Nutrition Goals Re-Evaluation):  Nutrition Goals Re-Evaluation - 11/09/20 1517      Goals   Current Weight 122 lb 12.7 oz (55.7 kg)    Nutrition Goal Pt to identify food quantities necessary to achieve weight gain of 0.5 lbs per week lb while in cardiac rehab      Personal Goal #2 Re-Evaluation   Personal Goal #2 Pt to build a healthy plate including vegetables, fruits, whole grains, and low-fat dairy products in a heart healthy meal plan.      Personal Goal #3 Re-Evaluation   Personal Goal #3 Add healthy fats to meals and shakes           Psychosocial: Target Goals:  Acknowledge presence or absence of significant depression and/or stress, maximize coping skills, provide positive support system. Participant is able to verbalize types and ability to use techniques and skills needed for reducing stress and depression.  Initial Review & Psychosocial Screening:  Initial Psych Review & Screening - 09/26/20 0835      Initial Review   Current issues with History of Depression      Family Dynamics   Good Support System? Yes   Koneta lives alone. Shadaria has her Son, Daughter in [place] and signigant other for support   Comments Patient's son currently admiited to the hospital with a CVA on [place].      Barriers   Psychosocial barriers to participate in program There are no identifiable barriers  or psychosocial needs.      Screening Interventions   Interventions Encouraged to exercise           Quality of Life Scores:  Quality of Life - 09/25/20 1522      Quality of Life   Select Quality of Life      Quality of Life Scores   Health/Function Pre 26.31 %    Socioeconomic Pre 27.75 %    Psych/Spiritual Pre 28.07 %    Family Pre 27 %    GLOBAL Pre 27.1 %          Scores of 19 and below usually indicate a poorer quality of life in these areas.  A difference of  2-3 points is a clinically meaningful difference.  A difference of 2-3 points in the total score of the Quality of Life Index has been associated with significant improvement in overall quality of life, self-image, physical symptoms, and general health in studies assessing change in quality of life.  PHQ-9: Recent Review Flowsheet Data    Depression screen [facility] 2/9 10/25/2020 09/27/2020 09/26/2020 08/27/2020 08/06/2020   Decreased Interest 0 0 0 1 1   Down, Depressed, Hopeless 0 0 0 1 1   PHQ - 2 Score 0 0 0 2 2   Altered sleeping - - - 2 1   Tired, decreased energy - - - 1 1   Change in appetite - - - 1 1   Feeling bad or failure about yourself  - - - 0 0   Trouble concentrating - - - 0 0   Moving  slowly or fidgety/restless - - - 0 0   Suicidal thoughts - - - 0 0   PHQ-9 Score - - - 6 5   Difficult doing work/chores - - - Somewhat difficult Somewhat difficult     Interpretation of Total Score  Total Score Depression Severity:  1-4 = Minimal depression, 5-9 = Mild depression, 10-14 = Moderate depression, 15-19 = Moderately severe depression, 20-27 = Severe depression   Psychosocial Evaluation and Intervention:   Psychosocial Re-Evaluation:  Psychosocial Re-Evaluation    Row Name 10/16/20 1701 11/13/20 1550           Psychosocial Re-Evaluation   Current issues with History of Depression History of Depression      Comments Phyliss as not voiced any increased stressors or depression Phyliss as not voiced any increased stressors or depression      Expected Outcomes Cinthya will continue have decreased stressors upon completion of phase 2 cardiac rehab Beren will continue have decreased stressors upon completion of phase 2 cardiac rehab      Interventions Encouraged to attend Cardiac Rehabilitation for the exercise;Stress management education Encouraged to attend Cardiac Rehabilitation for the exercise;Stress management education      Continue Psychosocial Services  Follow up required by staff Follow up required by staff             Psychosocial Discharge (Final Psychosocial Re-Evaluation):  Psychosocial Re-Evaluation - 11/13/20 1550      Psychosocial Re-Evaluation   Current issues with History of Depression    Comments Phyliss as not voiced any increased stressors or depression    Expected Outcomes Hazelyn will continue have decreased stressors upon completion of phase 2 cardiac rehab    Interventions Encouraged to attend Cardiac Rehabilitation for the exercise;Stress management education    Continue Psychosocial Services  Follow up required by staff  Vocational Rehabilitation: Provide vocational rehab assistance to qualifying candidates.   Vocational  Rehab Evaluation & Intervention:  Vocational Rehab - 09/26/20 1035      Initial Vocational Rehab Evaluation & Intervention   Assessment shows need for Vocational Rehabilitation No   Trenese is retired and does not need vocational rehab at this time          Education: Education Goals: Education classes will be provided on a weekly basis, covering required topics. Participant will state understanding/return demonstration of topics presented.  Learning Barriers/Preferences:  Learning Barriers/Preferences - 09/26/20 0839      Learning Barriers/Preferences   Learning Barriers Inability to learn new things;Sight;Exercise Concerns;Hearing   Wears hearing aides, glasses dizziness memory deficit   Learning Preferences Individual Instruction;Video;Skilled Demonstration           Education Topics: Hypertension, Hypertension Reduction -Define heart disease and high blood pressure. Discus how high blood pressure affects the body and ways to reduce high blood pressure.   Exercise and Your Heart -Discuss why it is important to exercise, the FITT principles of exercise, normal and abnormal responses to exercise, and how to exercise safely.   Angina -Discuss definition of angina, causes of angina, treatment of angina, and how to decrease risk of having angina.   Cardiac Medications -Review what the following cardiac medications are used for, how they affect the body, and side effects that may occur when taking the medications.  Medications include Aspirin, Beta blockers, calcium channel blockers, ACE Inhibitors, angiotensin receptor blockers, diuretics, digoxin, and antihyperlipidemics.   Congestive Heart Failure -Discuss the definition of CHF, how to live with CHF, the signs and symptoms of CHF, and how keep track of weight and sodium intake.   Heart Disease and Intimacy -Discus the effect sexual activity has on the heart, how changes occur during intimacy as we age, and safety during  sexual activity.   Smoking Cessation / COPD -Discuss different methods to quit smoking, the health benefits of quitting smoking, and the definition of COPD.   Nutrition I: Fats -Discuss the types of cholesterol, what cholesterol does to the heart, and how cholesterol levels can be controlled.   Nutrition II: Labels -Discuss the different components of food labels and how to read food label   Heart Parts/Heart Disease and PAD -Discuss the anatomy of the heart, the pathway of blood circulation through the heart, and these are affected by heart disease.   Stress I: Signs and Symptoms -Discuss the causes of stress, how stress may lead to anxiety and depression, and ways to limit stress.   Stress II: Relaxation -Discuss different types of relaxation techniques to limit stress.   Warning Signs of Stroke / TIA -Discuss definition of a stroke, what the signs and symptoms are of a stroke, and how to identify when someone is having stroke.   Knowledge Questionnaire Score:  Knowledge Questionnaire Score - 09/25/20 1522      Knowledge Questionnaire Score   Pre Score 23/24           Core Components/Risk Factors/Patient Goals at Admission:  Personal Goals and Risk Factors at Admission - 09/25/20 1505      Core Components/Risk Factors/Patient Goals on Admission    Weight Management Weight Gain;Yes    Intervention Weight Management: Develop a combined nutrition and exercise program designed to reach desired caloric intake, while maintaining appropriate intake of nutrient and fiber, sodium and fats, and appropriate energy expenditure required for the weight goal.;Weight Management: Provide education and appropriate resources  to help participant work on and attain dietary goals.    Admit Weight 123 lb 14.4 oz (56.2 kg)    Goal Weight: Short Term 126 lb (57.2 kg)    Goal Weight: Long Term 129 lb (58.5 kg)    Expected Outcomes Short Term: Continue to assess and modify interventions  until short term weight is achieved;Long Term: Adherence to nutrition and physical activity/exercise program aimed toward attainment of established weight goal;Weight Gain: Understanding of general recommendations for a high calorie, high protein meal plan that promotes weight gain by distributing calorie intake throughout the day with the consumption for 4-5 meals, snacks, and/or supplements    Heart Failure Yes    Intervention Provide a combined exercise and nutrition program that is supplemented with education, support and counseling about heart failure. Directed toward relieving symptoms such as shortness of breath, decreased exercise tolerance, and extremity edema.    Expected Outcomes Improve functional capacity of life;Short term: Attendance in program 2-3 days a week with increased exercise capacity. Reported lower sodium intake. Reported increased fruit and vegetable intake. Reports medication compliance.;Short term: Daily weights obtained and reported for increase. Utilizing diuretic protocols set by physician.;Long term: Adoption of self-care skills and reduction of barriers for early signs and symptoms recognition and intervention leading to self-care maintenance.    Hypertension Yes    Intervention Provide education on lifestyle modifcations including regular physical activity/exercise, weight management, moderate sodium restriction and increased consumption of fresh fruit, vegetables, and low fat dairy, alcohol moderation, and smoking cessation.;Monitor prescription use compliance.    Expected Outcomes Short Term: Continued assessment and intervention until BP is < 140/52mm HG in hypertensive participants. < 130/60mm HG in hypertensive participants with diabetes, heart failure or chronic kidney disease.;Long Term: Maintenance of blood pressure at goal levels.    Lipids Yes    Intervention Provide education and support for participant on nutrition & aerobic/resistive exercise along with  prescribed medications to achieve LDL 70mg , HDL >40mg .    Expected Outcomes Short Term: Participant states understanding of desired cholesterol values and is compliant with medications prescribed. Participant is following exercise prescription and nutrition guidelines.;Long Term: Cholesterol controlled with medications as prescribed, with individualized exercise RX and with personalized nutrition plan. Value goals: LDL < 70mg , HDL > 40 mg.    Stress Yes    Intervention Offer individual and/or small group education and counseling on adjustment to heart disease, stress management and health-related lifestyle change. Teach and support self-help strategies.;Refer participants experiencing significant psychosocial distress to appropriate mental health specialists for further evaluation and treatment. When possible, include family members and significant others in education/counseling sessions.    Expected Outcomes Short Term: Participant demonstrates changes in health-related behavior, relaxation and other stress management skills, ability to obtain effective social support, and compliance with psychotropic medications if prescribed.;Long Term: Emotional wellbeing is indicated by absence of clinically significant psychosocial distress or social isolation.    Personal Goal Other Yes    Personal Goal Be able to participate in social activities again.    Intervention Build strength and stamina, so patient can have energy to participate in social activities.    Expected Outcomes Patient will be able to participate in social activites as tolerated.           Core Components/Risk Factors/Patient Goals Review:   Goals and Risk Factor Review    Row Name 10/03/20 1459 10/16/20 1703 10/16/20 1704 11/13/20 1550       Core Components/Risk Factors/Patient Goals Review   Personal  Goals Review Weight Management/Obesity;Stress;Hypertension;Lipids Weight Management/Obesity Weight Management/Obesity;Heart  Failure;Stress;Hypertension;Lipids Weight Management/Obesity;Heart Failure;Stress;Hypertension;Lipids    Review Hollyann started exercise on 10/03/20 and did fair with exercise. Vital signs stable -- Lulia has been doing well with exercise at cardiac rehab. Patient's heart rate continues to be in th low 100's when she starts exercise which comes down after rest Babara has been doing well with exercise at cardiac rehab. Vital signs have been stable. Sheletha continues to have to rest so that her heart rate will come down below 100 to exercise.    Expected Outcomes Phyliis will continue to participate in phase 2 cardiac rehab for exercise nutrition and lifestyle modifications -- Analycia will continue to partcipate in phase 2 cardiac rehab for exercise, nutrition and lifestyle modifications Deleah will continue to partcipate in phase 2 cardiac rehab for exercise, nutrition and lifestyle modifications           Core Components/Risk Factors/Patient Goals at Discharge (Final Review):   Goals and Risk Factor Review - 11/13/20 1550      Core Components/Risk Factors/Patient Goals Review   Personal Goals Review Weight Management/Obesity;Heart Failure;Stress;Hypertension;Lipids    Review Tawnee has been doing well with exercise at cardiac rehab. Vital signs have been stable. Mikenna continues to have to rest so that her heart rate will come down below 100 to exercise.    Expected Outcomes Arnetha will continue to partcipate in phase 2 cardiac rehab for exercise, nutrition and lifestyle modifications           ITP Comments:  ITP Comments    Row Name 09/25/20 1330 10/03/20 1458 10/16/20 1657 11/13/20 1549     ITP Comments Medical Director- Dr. Fransico Him, MD 30 Day ITP Review. Bryona started exercise on 10/03/20 and did fair with exercise 30 Day ITP Review. Jamal has good attendance and participation in phase 2 cardiac rehab 30 Day ITP Review. Jamesha has good attendance and participation in phase 2  cardiac rehab           Comments: See ITP comments.Barnet Pall, RN,BSN 11/13/2020 3:52 PM

## 2020-11-14 ENCOUNTER — Other Ambulatory Visit: Payer: Self-pay

## 2020-11-14 ENCOUNTER — Encounter (HOSPITAL_COMMUNITY)
Admission: RE | Admit: 2020-11-14 | Discharge: 2020-11-14 | Disposition: A | Discharge status: Home / Self Care | Payer: Medicare Other | Source: Ambulatory Visit | Attending: Cardiology | Admitting: Cardiology

## 2020-11-14 DIAGNOSIS — Z955 Presence of coronary angioplasty implant and graft: Secondary | ICD-10-CM | POA: Diagnosis not present

## 2020-11-14 DIAGNOSIS — I214 Non-ST elevation (NSTEMI) myocardial infarction: Secondary | ICD-10-CM

## 2020-11-16 ENCOUNTER — Other Ambulatory Visit: Payer: Self-pay

## 2020-11-16 ENCOUNTER — Encounter (HOSPITAL_COMMUNITY)
Admission: RE | Admit: 2020-11-16 | Discharge: 2020-11-16 | Disposition: A | Discharge status: Home / Self Care | Payer: Medicare Other | Source: Ambulatory Visit | Attending: Cardiology | Admitting: Cardiology

## 2020-11-16 DIAGNOSIS — Z955 Presence of coronary angioplasty implant and graft: Secondary | ICD-10-CM | POA: Diagnosis not present

## 2020-11-16 DIAGNOSIS — I214 Non-ST elevation (NSTEMI) myocardial infarction: Secondary | ICD-10-CM | POA: Diagnosis not present

## 2020-11-19 ENCOUNTER — Other Ambulatory Visit: Payer: Self-pay

## 2020-11-19 ENCOUNTER — Encounter (HOSPITAL_COMMUNITY)
Admission: RE | Admit: 2020-11-19 | Discharge: 2020-11-19 | Disposition: A | Discharge status: Home / Self Care | Payer: Medicare Other | Source: Ambulatory Visit | Attending: Cardiology | Admitting: Cardiology

## 2020-11-19 DIAGNOSIS — I214 Non-ST elevation (NSTEMI) myocardial infarction: Secondary | ICD-10-CM | POA: Insufficient documentation

## 2020-11-19 DIAGNOSIS — Z955 Presence of coronary angioplasty implant and graft: Secondary | ICD-10-CM | POA: Diagnosis not present

## 2020-11-19 NOTE — Progress Notes (Signed)
Patricia Garcia reports that she scratched an area below her right shoulder blade where the electrodes are placed causing the area to bleed. "I called my son we looked on the Internet." The patient said she applied pressure and the bleeding stopped. No rash present today. Patricia Garcia exercised without complaints.Will continue to monitor the patient throughout  the program. Barnet Pall, RN,BSN 11/21/2020 10:55 AM

## 2020-11-21 ENCOUNTER — Encounter (HOSPITAL_COMMUNITY): Payer: Medicare Other

## 2020-11-21 ENCOUNTER — Encounter (HOSPITAL_COMMUNITY)
Admission: RE | Admit: 2020-11-21 | Discharge: 2020-11-21 | Disposition: A | Discharge status: Home / Self Care | Payer: Medicare Other | Source: Ambulatory Visit | Attending: Cardiology | Admitting: Cardiology

## 2020-11-21 ENCOUNTER — Other Ambulatory Visit: Payer: Self-pay

## 2020-11-21 DIAGNOSIS — I214 Non-ST elevation (NSTEMI) myocardial infarction: Secondary | ICD-10-CM

## 2020-11-21 DIAGNOSIS — Z955 Presence of coronary angioplasty implant and graft: Secondary | ICD-10-CM | POA: Diagnosis not present

## 2020-11-23 ENCOUNTER — Encounter (HOSPITAL_COMMUNITY): Payer: Medicare Other

## 2020-11-26 ENCOUNTER — Encounter (HOSPITAL_COMMUNITY): Payer: Medicare Other

## 2020-11-28 ENCOUNTER — Encounter (HOSPITAL_COMMUNITY): Payer: Medicare Other

## 2020-11-28 ENCOUNTER — Telehealth (HOSPITAL_COMMUNITY): Payer: Self-pay | Admitting: Geriatric Medicine

## 2020-11-29 ENCOUNTER — Other Ambulatory Visit: Payer: Medicare Other

## 2020-11-30 ENCOUNTER — Ambulatory Visit (HOSPITAL_COMMUNITY): Payer: Medicare Other

## 2020-12-01 ENCOUNTER — Ambulatory Visit
Admission: EM | Admit: 2020-12-01 | Discharge: 2020-12-01 | Disposition: A | Discharge status: Home / Self Care | Payer: Medicare Other | Attending: Emergency Medicine | Admitting: Emergency Medicine

## 2020-12-01 ENCOUNTER — Other Ambulatory Visit: Payer: Self-pay

## 2020-12-01 ENCOUNTER — Encounter: Payer: Self-pay | Admitting: Emergency Medicine

## 2020-12-01 DIAGNOSIS — Z1152 Encounter for screening for COVID-19: Secondary | ICD-10-CM

## 2020-12-01 DIAGNOSIS — J209 Acute bronchitis, unspecified: Secondary | ICD-10-CM | POA: Diagnosis not present

## 2020-12-01 DIAGNOSIS — J019 Acute sinusitis, unspecified: Secondary | ICD-10-CM

## 2020-12-01 MED ORDER — BENZONATATE 200 MG PO CAPS: 200.0000 mg | ORAL_CAPSULE | Freq: Three times a day (TID) | ORAL | 0 refills | Status: DC | PRN

## 2020-12-01 MED ORDER — ALBUTEROL SULFATE HFA 108 (90 BASE) MCG/ACT IN AERS
1.0000 | INHALATION_SPRAY | Freq: Four times a day (QID) | RESPIRATORY_TRACT | 0 refills | Status: DC | PRN
Start: 2020-12-01 — End: 2021-11-22

## 2020-12-01 MED ORDER — DOXYCYCLINE HYCLATE 100 MG PO CAPS: 100.0000 mg | ORAL_CAPSULE | Freq: Two times a day (BID) | ORAL | 0 refills | Status: DC

## 2020-12-01 MED ORDER — SPACER/AERO-HOLDING CHAMBERS DEVI: 1.0000 | Freq: Once | 0 refills | Status: AC

## 2020-12-01 MED ORDER — DM-GUAIFENESIN ER 30-600 MG PO TB12: 1.0000 | ORAL_TABLET | Freq: Two times a day (BID) | ORAL | 0 refills | Status: DC

## 2020-12-01 NOTE — Discharge Instructions (Signed)
Begin doxycycline twice daily for 1 week Albuterol inhaler with spacer 1 to 2 puffs as needed for shortness of breath, chest tightness and wheezing Mucinex DM twice daily for further relief of cough and congestion Tessalon every 8 hours for cough Rest and fluids Follow-up with for any worsening

## 2020-12-01 NOTE — ED Provider Notes (Signed)
[facility]    CSN: [id number] Arrival date & time: 12/01/20  5621      History   Chief Complaint Chief Complaint  Patient presents with  . Nasal Congestion    HPI Patricia Garcia is a 87 y.o. female history of CAD, diastolic CHF, hypertension, presenting today for evaluation of sinus symptoms.  Reports over the past week she has had sinus congestion, dental pain, drainage and cough.  Reports some shortness of breath and feels symptoms in chest.  Denies chest pain but has had shortness of breath with movement.  Denies any fevers.  Reports being on Plavix.  Using Flonase with some relief.  HPI  Past Medical History:  Diagnosis Date  . Anal or rectal pain   . Anemia   . Arthritis   . CAD (coronary artery disease)    a. NSTEMI 04/2020 s/p DES to [facility] and D1.  . Chronic diastolic CHF (congestive heart failure) ([place])   . Depression   . Diverticulosis of colon (without mention of hemorrhage)   . Hypertension   . Hypoalbuminemia   . Internal hemorrhoids without mention of complication   . Ischemic heart disease   . LBBB (left bundle branch block)   . Osteopenia   . Renal insufficiency   . S/P right hip fracture   . Slow transit constipation     Patient Active Problem List   Diagnosis Date Noted  . History of total hip arthroplasty 10/18/2020  . Trochanteric bursitis of right hip 10/18/2020  . Mild major depression, single episode ([place]) 09/26/2020  . Pure hypercholesterolemia 09/26/2020  . Pain of right hip joint 08/07/2020  . Acute non-ST segment elevation myocardial infarction ([place]) 08/02/2020  . Acute on chronic diastolic heart failure ([place]) 08/02/2020  . Allergic rhinitis due to pollen 08/02/2020  . Arteriosclerotic heart disease 08/02/2020  . Chronic kidney disease, stage 3a ([place]) 08/02/2020  . Dyslipidemia 08/02/2020  . Hardening of the aorta (main artery of the heart) ([place]) 08/02/2020  . Hearing loss 08/02/2020  . Hypertensive renal disease  08/02/2020  . Hypertensive heart failure ([place]) 08/02/2020  . Intestinal malabsorption 08/02/2020  . Ischemic cardiomyopathy 08/02/2020  . Primary localized osteoarthritis of pelvic region and thigh 08/02/2020  . Spondylosis without myelopathy 08/02/2020  . NSTEMI (non-ST elevated myocardial infarction) ([place]) 05/07/2020  . Postoperative anemia 05/07/2020  . Acute exacerbation of CHF (congestive heart failure) ([place]) 05/06/2020  . Acute respiratory failure with hypoxia ([place]) 05/06/2020  . LBBB (left bundle branch block) 05/06/2020  . Sinus congestion 05/06/2020  . Femur fracture, right ([place]) 04/30/2020  . Fracture of neck of femur ([place]) 04/30/2020  . Chronic abdominal pain 09/23/2011  . Gas pain 09/23/2011  . Constipation, slow transit 09/23/2011  . Anorectal pain 09/23/2011  . GASTRITIS 06/26/2010  . Slow transit constipation 06/21/2010  . RECTAL BLEEDING 06/21/2010  . Vitamin D deficiency 06/20/2010  . Generalized anxiety disorder 06/20/2010  . GLAUCOMA 06/20/2010  . Cataract 06/20/2010  . Benign essential hypertension 06/20/2010  . Chronic sinusitis 06/20/2010  . GERD 06/20/2010  . Disorder of skeletal system 06/20/2010  . Abdominal pain 06/20/2010  . MICROALBUMINURIA 06/20/2010  . Gastroesophageal reflux disease 06/20/2010    Past Surgical History:  Procedure Laterality Date  . ABDOMINAL HYSTERECTOMY    . CARDIAC CATHETERIZATION    . CATARACT EXTRACTION, BILATERAL    . CORONARY STENT INTERVENTION N/A 05/08/2020   Procedure: CORONARY STENT INTERVENTION;  Surgeon: Martinique, Peter M, MD;  Location: [facility];  Service: Cardiovascular;  Laterality: N/A;  . LEFT HEART CATH AND CORONARY ANGIOGRAPHY N/A 05/08/2020   Procedure: LEFT HEART CATH AND CORONARY ANGIOGRAPHY;  Surgeon: Martinique, Peter M, MD;  Location: [facility];  Service: Cardiovascular;  Laterality: N/A;  . MASS EXCISION  06/10/2011   Procedure: EXCISION MASS;  Surgeon: Wynonia Sours, MD;  Location: [facility];  Service: Orthopedics;  Laterality: Right;  excision cyst right thumb IP, debridement IP right thumb  . TONSILLECTOMY    . TOTAL HIP ARTHROPLASTY Right 05/02/2020   Procedure: TOTAL HIP ARTHROPLASTY ANTERIOR APPROACH;  Surgeon: Rod Can, MD;  Location: [facility];  Service: Orthopedics;  Laterality: Right;    OB History   No obstetric history on file.      Home Medications    Prior to Admission medications   Medication Sig Start Date End Date Taking? Authorizing Provider  albuterol (VENTOLIN HFA) 108 (90 Base) MCG/ACT inhaler Inhale 1-2 puffs into the lungs every 6 (six) hours as needed for wheezing or shortness of breath. 12/01/20  Yes Joann Jorge C, PA-C  benzonatate (TESSALON) 200 MG capsule Take 1 capsule (200 mg total) by mouth 3 (three) times daily as needed for up to 7 days for cough. 12/01/20 12/08/20 Yes Ziza Hastings C, PA-C  dextromethorphan-guaiFENesin (MUCINEX DM) 30-600 MG 12hr tablet Take 1 tablet by mouth 2 (two) times daily. 12/01/20  Yes Kanyia Heaslip C, PA-C  doxycycline (VIBRAMYCIN) 100 MG capsule Take 1 capsule (100 mg total) by mouth 2 (two) times daily for 7 days. 12/01/20 12/08/20 Yes Suliman Termini C, PA-C  Spacer/Aero-Holding Chambers DEVI 1 each by Does not apply route once for 1 dose. 12/01/20 12/01/20 Yes Olia Hinderliter C, PA-C  aspirin 81 MG chewable tablet aspirin 81 mg chewable tablet    [provider]  Calcium Carbonate-Vit D-Min (CALCIUM 1200 PO) Take 1,200 mg by mouth daily.    [provider]  cholecalciferol (VITAMIN D) 1000 UNITS tablet Take 1,000 Units by mouth daily.    [provider]  clopidogrel (PLAVIX) 75 MG tablet Take 1 tablet (75 mg total) by mouth daily. 08/23/20   Freada Bergeron, MD  docusate sodium (COLACE) 100 MG capsule Take 100 mg by mouth 3 (three) times a week.    [provider]  esomeprazole (NEXIUM) 40 MG capsule Take 1 capsule (40 mg total) by mouth daily. 08/24/20    Freada Bergeron, MD  ferrous sulfate 325 (65 FE) MG tablet Take 325 mg by mouth See admin instructions. 3 times a week    [provider]  latanoprost (XALATAN) 0.005 % ophthalmic solution Place 1 drop into both eyes at bedtime.    [provider]  losartan (COZAAR) 50 MG tablet losartan 50 mg tablet  Take 1 tablet (50 mg total) by mouth daily.    [provider]  multivitamin-iron-minerals-folic acid (CENTRUM) chewable tablet Chew 1 tablet by mouth daily.    [provider]  Naphazoline-Pheniramine (OPCON-A OP) Place 1 drop into both eyes daily as needed (for eye allergy).    [provider]  Omega-3 Fatty Acids (FISH OIL) 1000 MG CAPS Take 1,000 mg by mouth daily.    [provider]  sodium chloride (MURO 128) 2 % ophthalmic solution Place 1 drop into both eyes 2 (two) times daily as needed for eye irritation.    [provider]    Family History Family History  Problem Relation Age of Onset  . Colon polyps  Mother   . Cancer Father        bladder  . Heart disease Father     Social History Social History   Tobacco Use  . Smoking status: Former Smoker    Quit date: 06/05/1984    Years since quitting: 36.5  . Smokeless tobacco: Never Used  Vaping Use  . Vaping Use: Never used  Substance Use Topics  . Alcohol use: No  . Drug use: No     Allergies   Penicillins, Prednisone, Statins, Sulfonamide derivatives, and Tramadol   Review of Systems Review of Systems  Constitutional: Negative for activity change, appetite change, chills, fatigue and fever.  HENT: Positive for congestion, rhinorrhea, sinus pressure and sore throat. Negative for ear pain and trouble swallowing.   Eyes: Negative for discharge and redness.  Respiratory: Positive for cough and shortness of breath. Negative for chest tightness.   Cardiovascular: Negative for chest pain.  Gastrointestinal: Negative for abdominal pain, diarrhea, nausea and  vomiting.  Musculoskeletal: Negative for myalgias.  Skin: Negative for rash.  Neurological: Negative for dizziness, light-headedness and headaches.     Physical Exam Triage Vital Signs ED Triage Vitals  Enc Vitals Group     BP 12/01/20 0848 130/83     Pulse Rate 12/01/20 0848 89     Resp 12/01/20 0848 16     Temp 12/01/20 0848 98.2 F (36.8 C)     Temp Source 12/01/20 0848 Oral     SpO2 12/01/20 0848 96 %     Weight --      Height --      Head Circumference --      Peak Flow --      Pain Score 12/01/20 0852 0     Pain Loc --      Pain Edu? --      Excl. in [place]? --    No data found.  Updated Vital Signs BP 130/83 (BP Location: Right Arm)   Pulse 89   Temp 98.2 F (36.8 C) (Oral)   Resp 16   SpO2 96%   Visual Acuity Right Eye Distance:   Left Eye Distance:   Bilateral Distance:    Right Eye Near:   Left Eye Near:    Bilateral Near:     Physical Exam Vitals and nursing note reviewed.  Constitutional:      Appearance: She is well-developed.     Comments: No acute distress  HENT:     Head: Normocephalic and atraumatic.     Ears:     Comments: Bilateral ears without tenderness to palpation of external auricle, tragus and mastoid, EAC's without erythema or swelling, TM's with good bony landmarks and cone of light. Non erythematous.     Nose: Nose normal.     Mouth/Throat:     Comments: Oral mucosa pink and dry, no tonsillar enlargement or exudate. Posterior pharynx patent and nonerythematous, no uvula deviation or swelling. Normal phonation.  Eyes:     Conjunctiva/sclera: Conjunctivae normal.  Cardiovascular:     Rate and Rhythm: Normal rate and regular rhythm.  Pulmonary:     Effort: Pulmonary effort is normal. No respiratory distress.     Comments: Expiratory wheezing/coarseness noted throughout bilateral lung fields Abdominal:     General: There is no distension.  Musculoskeletal:        General: Normal range of motion.     Cervical back: Neck supple.   Skin:    General: Skin is warm and dry.  Neurological:     Mental Status: She is alert and oriented to person, place, and time.      UC Treatments / Results  Labs (all labs ordered are listed, but only abnormal results are displayed) Labs Reviewed  NOVEL CORONAVIRUS, NAA    EKG   Radiology No results found.  Procedures Procedures (including critical care time)  Medications Ordered in UC Medications - No data to display  Initial Impression / Assessment and Plan / UC Course  I have reviewed the triage vital signs and the nursing notes.  Pertinent labs & imaging results that were available during my care of the patient were reviewed by me and considered in my medical decision making (see chart for details).     Covering for sinusitis/bronchitis, patient wished to defer steroids at this time, treating with doxycycline albuterol Mucinex and Tessalon, continue Flonase, rest and fluids.  COVID test pending for screening.  Discussed strict return precautions. Patient verbalized understanding and is agreeable with plan.  Final Clinical Impressions(s) / UC Diagnoses   Final diagnoses:  Encounter for screening for COVID-19  Acute sinusitis with symptoms > 10 days  Acute bronchitis, unspecified organism     Discharge Instructions     Begin doxycycline twice daily for 1 week Albuterol inhaler with spacer 1 to 2 puffs as needed for shortness of breath, chest tightness and wheezing Mucinex DM twice daily for further relief of cough and congestion Tessalon every 8 hours for cough Rest and fluids Follow-up with for any worsening    ED Prescriptions    Medication Sig Dispense Auth. Provider   doxycycline (VIBRAMYCIN) 100 MG capsule Take 1 capsule (100 mg total) by mouth 2 (two) times daily for 7 days. 14 capsule Pranathi Winfree C, PA-C   albuterol (VENTOLIN HFA) 108 (90 Base) MCG/ACT inhaler Inhale 1-2 puffs into the lungs every 6 (six) hours as needed for wheezing or  shortness of breath. 18 g Trinidee Schrag, [place] C, PA-C   Spacer/Aero-Holding Chambers DEVI 1 each by Does not apply route once for 1 dose. 1 each Brice Kossman C, PA-C   dextromethorphan-guaiFENesin (MUCINEX DM) 30-600 MG 12hr tablet Take 1 tablet by mouth 2 (two) times daily. 20 tablet Shayanne Gomm C, PA-C   benzonatate (TESSALON) 200 MG capsule Take 1 capsule (200 mg total) by mouth 3 (three) times daily as needed for up to 7 days for cough. 28 capsule Krystofer Hevener, [place] C, PA-C     PDMP not reviewed this encounter.   Joneen Caraway [place] C, PA-C 12/01/20 1043

## 2020-12-01 NOTE — ED Triage Notes (Signed)
Pt said she was at a graduation last weekend outside and that night she started having congestion, nagging cough. No fevers, no chills. Some jheadache when coughing. Pt said SOB with movement. Pt said no chest pain.

## 2020-12-03 ENCOUNTER — Telehealth (HOSPITAL_COMMUNITY): Payer: Self-pay | Admitting: Geriatric Medicine

## 2020-12-03 LAB — NOVEL CORONAVIRUS, NAA: SARS-CoV-2, NAA: NOT DETECTED

## 2020-12-03 LAB — SARS-COV-2, NAA 2 DAY TAT

## 2020-12-04 ENCOUNTER — Telehealth: Payer: Self-pay | Admitting: Cardiology

## 2020-12-04 ENCOUNTER — Other Ambulatory Visit: Payer: Self-pay | Admitting: *Deleted

## 2020-12-04 ENCOUNTER — Telehealth: Payer: Self-pay | Admitting: Emergency Medicine

## 2020-12-04 ENCOUNTER — Telehealth (HOSPITAL_COMMUNITY): Payer: Self-pay | Admitting: Geriatric Medicine

## 2020-12-04 MED ORDER — CEFDINIR 300 MG PO CAPS: 300.0000 mg | ORAL_CAPSULE | Freq: Two times a day (BID) | ORAL | 0 refills | Status: DC

## 2020-12-04 NOTE — Telephone Encounter (Signed)
Pt is calling in to ask Dr. Johney Frame or our Pharmacist if it is safe with all her cardiac meds and history, to proceed with taking newly prescribed antibiotic,  Cefdinir.  Pt states she was taking doxycycline for acute sinusitis and bronchitis, and had to come off of this due to upset stomach and feeling nauseated.  Pt states Urgent Care sent in for her to take Cefdinir now, but wanted to run this by our office first before taking, to make sure this doesn't interact with any of her cardiac meds/history.  Informed the pt that I will route this message to both Dr. Johney Frame and our Pharmacist here in the clinic, to further review and advise on this matter.   Informed the pt that I will follow-up with her shortly thereafter. Pt verbalized understanding and agrees with this plan.

## 2020-12-04 NOTE — Telephone Encounter (Signed)
Pt c/o medication issue:  1. Name of Medication: cefdinir (OMNICEF) 300 MG capsule and doxycycline (VIBRAMYCIN) 100 MG capsule  2. How are you currently taking this medication (dosage and times per day)? Stopped doxycycline and started cefdinir today  3. Are you having a reaction (difficulty breathing--STAT)? yes  4. What is your medication issue? Patient went to Urgent Care on Saturday and was given doxycycline for an acute sinus infection and acute bronchitis. She states that every time she took it she would throw up. She called urgent care today and let them know and they changed her prescription to cefdinir but she is already having side effects from that medication as well such as SOB. She states she cannot take high dose medication. She wants to speak with a triage nurse in regards to these medication reactions. Please advise.

## 2020-12-04 NOTE — Telephone Encounter (Signed)
Yes, this is ok to take.

## 2020-12-04 NOTE — Telephone Encounter (Signed)
Pt aware of PharmD's recommendations and Cefdinir being safe to take with all her other cardiac meds. Pt verbalized understanding and agrees with this plan. Pt was more than gracious for all the assistance provided.

## 2020-12-04 NOTE — Telephone Encounter (Signed)
Patient states she is vomiting with Doxycycline, even if she drinks a full bottle of water after or takes with food.   Per Dr. Mannie Stabile, change to [facility]

## 2020-12-04 NOTE — Patient Outreach (Signed)
[facility]) Care Management  12/04/2020  Patricia Garcia May 02, 1933 [id number]         [facility] incoming outreach from patient- care coordination Patricia Garcia is a previously referred [facility] post hospital/complex care patient Her [facility] services concluded on 10/25/20  Insurance:NextGen Medicareandblue cross and blue shieldsupplement    Patricia alexxus sobh to [facility] RN CM Patient is able to verify HIPAA ([place] and Accountability Act) identifiers Reviewed and addressed the purpose of the follow up call with the patient  Consent: [facility] RN CM reviewed [facility] services with patient. Patient gave verbal consent for services.   Assessment She reports Emesis from doxycycline she is taking for bronchitis and acute sinusitis diagnosed on 12/01/20  She had experienced fatigue, headaches,  She reports not feeling well for 2 weeks after attending a graduation and choral event She reports no diarrhea but a "stomach upset" that started on Saturday 12/01/20 starting doxycycline prescribed after her urgent care visit on 12/01/20 by Debara Pickett, PA-C  She reports she is not eating well but is taking food with the doxycycline Now bringing up mucus and threw up her breakfast and dinner on 12/03/20 She has eaten but is fearful of taking the doxycycline today Now 120 lbs  Outreach to [phone] [place] urgent care with patient in a conference call  Spoke with receptionist, Deidra who sent a message to nurse Katie Pt requesting a change in antibiotic and want [facility] on [address] -closer to her home so her son can pick it up for her   Did not go cardiac rehab last week related to these symptoms She voiced interest in covid test results that she reports she had not been notified of the results Her 12/01/20 covid test was negative She is pleased with this as she reports this information is needed so she can  return to cardiac rehab sessions on next week when she feels better  She is thankful for the care coordination assistance  Goals Addressed              This Visit's Progress     Patient Stated   .  COMPLETED: [facility]) Find Help in My Community (pt-stated)   On track     Timeframe:  Short-Term Goal Priority:  High Start Date:                        12/04/20     Expected End Date:          12/04/20                - begin a notebook of services in my neighborhood or community - follow-up on any referrals for help I am given Outreach to [place] urgent care as needed at [phone]      Notes:  12/04/20 Outreach to [phone] [place] urgent care with patient in a conference call  Spoke with receptionist, Deidra who sent a message to nurse Katie Pt requesting a change in antibiotic and want [facility] [address] -closer to her home so her son can pick it up for her  Pt wrote down the urgent care's number in case a further call is needed          [place]. Lavina Hamman, RN, BSN, [place] Coordinator Office number [phone] Main  [facility] number [phone] Fax number [phone]

## 2020-12-05 ENCOUNTER — Encounter: Payer: Self-pay | Admitting: *Deleted

## 2020-12-05 ENCOUNTER — Ambulatory Visit (HOSPITAL_COMMUNITY): Payer: Medicare Other

## 2020-12-05 DIAGNOSIS — I214 Non-ST elevation (NSTEMI) myocardial infarction: Secondary | ICD-10-CM | POA: Diagnosis not present

## 2020-12-05 DIAGNOSIS — I2510 Atherosclerotic heart disease of native coronary artery without angina pectoris: Secondary | ICD-10-CM | POA: Diagnosis not present

## 2020-12-05 DIAGNOSIS — I255 Ischemic cardiomyopathy: Secondary | ICD-10-CM | POA: Diagnosis not present

## 2020-12-05 DIAGNOSIS — I5033 Acute on chronic diastolic (congestive) heart failure: Secondary | ICD-10-CM | POA: Diagnosis not present

## 2020-12-05 DIAGNOSIS — N1831 Chronic kidney disease, stage 3a: Secondary | ICD-10-CM | POA: Diagnosis not present

## 2020-12-05 DIAGNOSIS — E785 Hyperlipidemia, unspecified: Secondary | ICD-10-CM | POA: Diagnosis not present

## 2020-12-05 DIAGNOSIS — K219 Gastro-esophageal reflux disease without esophagitis: Secondary | ICD-10-CM | POA: Diagnosis not present

## 2020-12-05 DIAGNOSIS — I129 Hypertensive chronic kidney disease with stage 1 through stage 4 chronic kidney disease, or unspecified chronic kidney disease: Secondary | ICD-10-CM | POA: Diagnosis not present

## 2020-12-05 DIAGNOSIS — I110 Hypertensive heart disease with heart failure: Secondary | ICD-10-CM | POA: Diagnosis not present

## 2020-12-05 DIAGNOSIS — M479 Spondylosis, unspecified: Secondary | ICD-10-CM | POA: Diagnosis not present

## 2020-12-05 DIAGNOSIS — F320 Major depressive disorder, single episode, mild: Secondary | ICD-10-CM | POA: Diagnosis not present

## 2020-12-05 DIAGNOSIS — E7800 Pure hypercholesterolemia, unspecified: Secondary | ICD-10-CM | POA: Diagnosis not present

## 2020-12-06 ENCOUNTER — Emergency Department (HOSPITAL_COMMUNITY): Payer: Medicare Other

## 2020-12-06 ENCOUNTER — Inpatient Hospital Stay (HOSPITAL_COMMUNITY)
Admission: EM | Admit: 2020-12-06 | Discharge: 2020-12-11 | DRG: 286 | Disposition: A | Discharge status: Home / Self Care | Payer: Medicare Other | Attending: Internal Medicine | Admitting: Internal Medicine

## 2020-12-06 ENCOUNTER — Other Ambulatory Visit (HOSPITAL_COMMUNITY): Payer: Medicare Other

## 2020-12-06 ENCOUNTER — Other Ambulatory Visit: Payer: Self-pay

## 2020-12-06 ENCOUNTER — Encounter (HOSPITAL_COMMUNITY): Payer: Self-pay

## 2020-12-06 DIAGNOSIS — Z87891 Personal history of nicotine dependence: Secondary | ICD-10-CM | POA: Diagnosis not present

## 2020-12-06 DIAGNOSIS — I130 Hypertensive heart and chronic kidney disease with heart failure and stage 1 through stage 4 chronic kidney disease, or unspecified chronic kidney disease: Secondary | ICD-10-CM | POA: Diagnosis not present

## 2020-12-06 DIAGNOSIS — E785 Hyperlipidemia, unspecified: Secondary | ICD-10-CM | POA: Diagnosis present

## 2020-12-06 DIAGNOSIS — J9601 Acute respiratory failure with hypoxia: Secondary | ICD-10-CM | POA: Diagnosis present

## 2020-12-06 DIAGNOSIS — K219 Gastro-esophageal reflux disease without esophagitis: Secondary | ICD-10-CM | POA: Diagnosis present

## 2020-12-06 DIAGNOSIS — T50905A Adverse effect of unspecified drugs, medicaments and biological substances, initial encounter: Secondary | ICD-10-CM

## 2020-12-06 DIAGNOSIS — D649 Anemia, unspecified: Secondary | ICD-10-CM | POA: Diagnosis not present

## 2020-12-06 DIAGNOSIS — I25119 Atherosclerotic heart disease of native coronary artery with unspecified angina pectoris: Secondary | ICD-10-CM | POA: Diagnosis present

## 2020-12-06 DIAGNOSIS — I10 Essential (primary) hypertension: Secondary | ICD-10-CM | POA: Diagnosis not present

## 2020-12-06 DIAGNOSIS — M8580 Other specified disorders of bone density and structure, unspecified site: Secondary | ICD-10-CM | POA: Diagnosis present

## 2020-12-06 DIAGNOSIS — R9431 Abnormal electrocardiogram [ECG] [EKG]: Secondary | ICD-10-CM | POA: Diagnosis not present

## 2020-12-06 DIAGNOSIS — T50995A Adverse effect of other drugs, medicaments and biological substances, initial encounter: Secondary | ICD-10-CM | POA: Diagnosis not present

## 2020-12-06 DIAGNOSIS — Z20822 Contact with and (suspected) exposure to covid-19: Secondary | ICD-10-CM | POA: Diagnosis present

## 2020-12-06 DIAGNOSIS — J90 Pleural effusion, not elsewhere classified: Secondary | ICD-10-CM | POA: Diagnosis not present

## 2020-12-06 DIAGNOSIS — I252 Old myocardial infarction: Secondary | ICD-10-CM

## 2020-12-06 DIAGNOSIS — Z90710 Acquired absence of both cervix and uterus: Secondary | ICD-10-CM | POA: Diagnosis not present

## 2020-12-06 DIAGNOSIS — I2510 Atherosclerotic heart disease of native coronary artery without angina pectoris: Secondary | ICD-10-CM | POA: Diagnosis not present

## 2020-12-06 DIAGNOSIS — Z885 Allergy status to narcotic agent status: Secondary | ICD-10-CM

## 2020-12-06 DIAGNOSIS — Z881 Allergy status to other antibiotic agents status: Secondary | ICD-10-CM

## 2020-12-06 DIAGNOSIS — Z7902 Long term (current) use of antithrombotics/antiplatelets: Secondary | ICD-10-CM | POA: Diagnosis not present

## 2020-12-06 DIAGNOSIS — N200 Calculus of kidney: Secondary | ICD-10-CM | POA: Diagnosis present

## 2020-12-06 DIAGNOSIS — M1990 Unspecified osteoarthritis, unspecified site: Secondary | ICD-10-CM | POA: Diagnosis present

## 2020-12-06 DIAGNOSIS — Z880 Allergy status to penicillin: Secondary | ICD-10-CM

## 2020-12-06 DIAGNOSIS — I5043 Acute on chronic combined systolic (congestive) and diastolic (congestive) heart failure: Secondary | ICD-10-CM | POA: Diagnosis not present

## 2020-12-06 DIAGNOSIS — Z7982 Long term (current) use of aspirin: Secondary | ICD-10-CM | POA: Diagnosis not present

## 2020-12-06 DIAGNOSIS — I5033 Acute on chronic diastolic (congestive) heart failure: Secondary | ICD-10-CM

## 2020-12-06 DIAGNOSIS — N1831 Chronic kidney disease, stage 3a: Secondary | ICD-10-CM | POA: Diagnosis present

## 2020-12-06 DIAGNOSIS — R112 Nausea with vomiting, unspecified: Secondary | ICD-10-CM | POA: Diagnosis present

## 2020-12-06 DIAGNOSIS — I5041 Acute combined systolic (congestive) and diastolic (congestive) heart failure: Secondary | ICD-10-CM | POA: Diagnosis not present

## 2020-12-06 DIAGNOSIS — Z888 Allergy status to other drugs, medicaments and biological substances status: Secondary | ICD-10-CM

## 2020-12-06 DIAGNOSIS — J9691 Respiratory failure, unspecified with hypoxia: Secondary | ICD-10-CM | POA: Diagnosis present

## 2020-12-06 DIAGNOSIS — Z79899 Other long term (current) drug therapy: Secondary | ICD-10-CM | POA: Diagnosis not present

## 2020-12-06 DIAGNOSIS — R140 Abdominal distension (gaseous): Secondary | ICD-10-CM | POA: Diagnosis not present

## 2020-12-06 DIAGNOSIS — Z882 Allergy status to sulfonamides status: Secondary | ICD-10-CM

## 2020-12-06 DIAGNOSIS — E871 Hypo-osmolality and hyponatremia: Secondary | ICD-10-CM | POA: Diagnosis present

## 2020-12-06 DIAGNOSIS — J9621 Acute and chronic respiratory failure with hypoxia: Secondary | ICD-10-CM | POA: Diagnosis not present

## 2020-12-06 DIAGNOSIS — I878 Other specified disorders of veins: Secondary | ICD-10-CM | POA: Diagnosis not present

## 2020-12-06 DIAGNOSIS — K5730 Diverticulosis of large intestine without perforation or abscess without bleeding: Secondary | ICD-10-CM | POA: Diagnosis present

## 2020-12-06 DIAGNOSIS — Z955 Presence of coronary angioplasty implant and graft: Secondary | ICD-10-CM | POA: Diagnosis not present

## 2020-12-06 DIAGNOSIS — I272 Pulmonary hypertension, unspecified: Secondary | ICD-10-CM | POA: Diagnosis present

## 2020-12-06 DIAGNOSIS — I5021 Acute systolic (congestive) heart failure: Secondary | ICD-10-CM | POA: Diagnosis not present

## 2020-12-06 DIAGNOSIS — J810 Acute pulmonary edema: Secondary | ICD-10-CM | POA: Diagnosis not present

## 2020-12-06 DIAGNOSIS — E7800 Pure hypercholesterolemia, unspecified: Secondary | ICD-10-CM | POA: Diagnosis present

## 2020-12-06 DIAGNOSIS — I447 Left bundle-branch block, unspecified: Secondary | ICD-10-CM | POA: Diagnosis present

## 2020-12-06 DIAGNOSIS — Z8249 Family history of ischemic heart disease and other diseases of the circulatory system: Secondary | ICD-10-CM

## 2020-12-06 LAB — CBC
HCT: 30 % — ABNORMAL LOW (ref 36.0–46.0)
Hemoglobin: 10 g/dL — ABNORMAL LOW (ref 12.0–15.0)
MCH: 28.1 pg (ref 26.0–34.0)
MCHC: 33.3 g/dL (ref 30.0–36.0)
MCV: 84.3 fL (ref 80.0–100.0)
Platelets: 386 10*3/uL (ref 150–400)
RBC: 3.56 MIL/uL — ABNORMAL LOW (ref 3.87–5.11)
RDW: 13.4 % (ref 11.5–15.5)
WBC: 8.3 10*3/uL (ref 4.0–10.5)
nRBC: 0 % (ref 0.0–0.2)

## 2020-12-06 LAB — TROPONIN I (HIGH SENSITIVITY)
Troponin I (High Sensitivity): 15 ng/L (ref <18)
Troponin I (High Sensitivity): 16 ng/L (ref <18)

## 2020-12-06 LAB — COMPREHENSIVE METABOLIC PANEL
ALT: 44 U/L (ref 0–44)
AST: 45 U/L — ABNORMAL HIGH (ref 15–41)
Albumin: 3.4 g/dL — ABNORMAL LOW (ref 3.5–5.0)
Alkaline Phosphatase: 62 U/L (ref 38–126)
Anion gap: 12 (ref 5–15)
BUN: 15 mg/dL (ref 8–23)
CO2: 19 mmol/L — ABNORMAL LOW (ref 22–32)
Calcium: 9.2 mg/dL (ref 8.9–10.3)
Chloride: 94 mmol/L — ABNORMAL LOW (ref 98–111)
Creatinine, Ser: 0.86 mg/dL (ref 0.44–1.00)
GFR, Estimated: >60 mL/min (ref >60)
Glucose, Bld: 175 mg/dL — ABNORMAL HIGH (ref 70–99)
Potassium: 3.5 mmol/L (ref 3.5–5.1)
Sodium: 125 mmol/L — ABNORMAL LOW (ref 135–145)
Total Bilirubin: 0.7 mg/dL (ref 0.3–1.2)
Total Protein: 6.6 g/dL (ref 6.5–8.1)

## 2020-12-06 LAB — RESP PANEL BY RT-PCR (FLU A&B, COVID) ARPGX2
Influenza A by PCR: NEGATIVE
Influenza B by PCR: NEGATIVE
SARS Coronavirus 2 by RT PCR: NEGATIVE

## 2020-12-06 LAB — URINALYSIS, ROUTINE W REFLEX MICROSCOPIC
Bilirubin Urine: NEGATIVE
Glucose, UA: NEGATIVE mg/dL
Ketones, ur: NEGATIVE mg/dL
Leukocytes,Ua: NEGATIVE
Nitrite: NEGATIVE
Protein, ur: 100 mg/dL — AB
Specific Gravity, Urine: >1.03 — ABNORMAL HIGH (ref 1.005–1.030)
pH: 5 (ref 5.0–8.0)

## 2020-12-06 LAB — LIPASE, BLOOD: Lipase: 34 U/L (ref 11–51)

## 2020-12-06 LAB — URINALYSIS, MICROSCOPIC (REFLEX)

## 2020-12-06 LAB — MAGNESIUM: Magnesium: 1.7 mg/dL (ref 1.7–2.4)

## 2020-12-06 LAB — SODIUM: Sodium: 128 mmol/L — ABNORMAL LOW (ref 135–145)

## 2020-12-06 LAB — BRAIN NATRIURETIC PEPTIDE: B Natriuretic Peptide: 1701.2 pg/mL — ABNORMAL HIGH (ref 0.0–100.0)

## 2020-12-06 LAB — OSMOLALITY, URINE: Osmolality, Ur: 155 mOsm/kg — ABNORMAL LOW (ref 300–900)

## 2020-12-06 LAB — SODIUM, URINE, RANDOM: Sodium, Ur: 49 mmol/L

## 2020-12-06 MED ORDER — ENOXAPARIN SODIUM 40 MG/0.4ML IJ SOSY
40.0000 mg | PREFILLED_SYRINGE | INTRAMUSCULAR | Status: DC
  Administered 2020-12-06 – 2020-12-09 (×4): 40 mg via SUBCUTANEOUS
  Filled 2020-12-06 (×4): qty 0.4

## 2020-12-06 MED ORDER — CLOPIDOGREL BISULFATE 75 MG PO TABS
75.0000 mg | ORAL_TABLET | Freq: Every day | ORAL | Status: DC
  Administered 2020-12-06 – 2020-12-11 (×6): 75 mg via ORAL
  Filled 2020-12-06 (×6): qty 1

## 2020-12-06 MED ORDER — SODIUM CHLORIDE 0.9% FLUSH: 3.0000 mL | INTRAVENOUS | Status: DC | PRN

## 2020-12-06 MED ORDER — ACETAMINOPHEN 325 MG PO TABS: 650.0000 mg | ORAL_TABLET | ORAL | Status: DC | PRN

## 2020-12-06 MED ORDER — LORATADINE 10 MG PO TABS
10.0000 mg | ORAL_TABLET | Freq: Every day | ORAL | Status: DC
  Administered 2020-12-06 – 2020-12-11 (×6): 10 mg via ORAL
  Filled 2020-12-06 (×6): qty 1

## 2020-12-06 MED ORDER — FUROSEMIDE 10 MG/ML IJ SOLN
40.0000 mg | Freq: Two times a day (BID) | INTRAMUSCULAR | Status: DC
  Administered 2020-12-06 – 2020-12-07 (×2): 40 mg via INTRAVENOUS
  Filled 2020-12-06 (×2): qty 4

## 2020-12-06 MED ORDER — ONDANSETRON HCL 4 MG/2ML IJ SOLN
4.0000 mg | Freq: Once | INTRAMUSCULAR | Status: AC
  Administered 2020-12-06: 4 mg via INTRAVENOUS
  Filled 2020-12-06: qty 2

## 2020-12-06 MED ORDER — ASPIRIN 81 MG PO CHEW
81.0000 mg | CHEWABLE_TABLET | Freq: Every day | ORAL | Status: DC
  Administered 2020-12-07 – 2020-12-11 (×4): 81 mg via ORAL
  Filled 2020-12-06 (×6): qty 1

## 2020-12-06 MED ORDER — FUROSEMIDE 10 MG/ML IJ SOLN
40.0000 mg | Freq: Once | INTRAMUSCULAR | Status: AC
  Administered 2020-12-06: 40 mg via INTRAVENOUS
  Filled 2020-12-06: qty 4

## 2020-12-06 MED ORDER — ONDANSETRON 4 MG PO TBDP: 8.0000 mg | ORAL_TABLET | Freq: Once | ORAL | Status: DC

## 2020-12-06 MED ORDER — POTASSIUM CHLORIDE CRYS ER 20 MEQ PO TBCR
40.0000 meq | EXTENDED_RELEASE_TABLET | ORAL | Status: AC
  Administered 2020-12-06: 40 meq via ORAL
  Filled 2020-12-06: qty 2

## 2020-12-06 MED ORDER — CYCLOBENZAPRINE HCL 5 MG PO TABS
5.0000 mg | ORAL_TABLET | Freq: Once | ORAL | Status: AC
  Administered 2020-12-06: 5 mg via ORAL
  Filled 2020-12-06: qty 1

## 2020-12-06 MED ORDER — LATANOPROST 0.005 % OP SOLN
1.0000 [drp] | Freq: Every day | OPHTHALMIC | Status: DC
  Administered 2020-12-06 – 2020-12-10 (×5): 1 [drp] via OPHTHALMIC
  Filled 2020-12-06: qty 2.5

## 2020-12-06 MED ORDER — DOCUSATE SODIUM 100 MG PO CAPS
100.0000 mg | ORAL_CAPSULE | ORAL | Status: DC
  Administered 2020-12-07 – 2020-12-11 (×3): 100 mg via ORAL
  Filled 2020-12-06 (×5): qty 1

## 2020-12-06 MED ORDER — SODIUM CHLORIDE 0.9 % IV SOLN: 250.0000 mL | INTRAVENOUS | Status: DC | PRN

## 2020-12-06 MED ORDER — ASPIRIN 81 MG PO CHEW
324.0000 mg | CHEWABLE_TABLET | Freq: Once | ORAL | Status: AC
  Administered 2020-12-06: 324 mg via ORAL
  Filled 2020-12-06: qty 4

## 2020-12-06 MED ORDER — SODIUM CHLORIDE 0.9% FLUSH
3.0000 mL | Freq: Two times a day (BID) | INTRAVENOUS | Status: DC
  Administered 2020-12-06 – 2020-12-09 (×7): 3 mL via INTRAVENOUS

## 2020-12-06 MED ORDER — MAGNESIUM OXIDE -MG SUPPLEMENT 400 (240 MG) MG PO TABS
400.0000 mg | ORAL_TABLET | Freq: Once | ORAL | Status: AC
  Administered 2020-12-06: 400 mg via ORAL
  Filled 2020-12-06: qty 1

## 2020-12-06 MED ORDER — ONDANSETRON HCL 4 MG/2ML IJ SOLN: 4.0000 mg | Freq: Four times a day (QID) | INTRAMUSCULAR | Status: DC | PRN

## 2020-12-06 NOTE — ED Notes (Signed)
Pt placed on 4L  - desatting in the mid 80s

## 2020-12-06 NOTE — Plan of Care (Signed)

## 2020-12-06 NOTE — ED Provider Notes (Addendum)
[facility] EMERGENCY DEPARTMENT Provider Note   CSN: [id number] Arrival date & time: 12/06/20  0231     History Chief Complaint  Patient presents with  . Nausea  . Emesis    Patricia Garcia is a 87 y.o. female.  The history is provided by the patient.  Emesis Severity:  Moderate Duration:  1 day Timing:  Intermittent Quality:  Stomach contents Progression:  Unchanged Chronicity:  New Recent urination:  Normal Context: not post-tussive   Relieved by:  Nothing Worsened by:  Nothing Ineffective treatments:  None tried Associated symptoms: no abdominal pain and no fever   Associated symptoms comment:  Shortness of breath and neck pain Risk factors: no diabetes   Patient with known CAD placed on antibiotics for sinus infection presents with nausea and vomiting, she states she has had some SOB and some neck pain since being in the waiting room in the ED, but no CP.  No abdominal pain.       Past Medical History:  Diagnosis Date  . Anal or rectal pain   . Anemia   . Arthritis   . CAD (coronary artery disease)    a. NSTEMI 04/2020 s/p DES to [facility] and D1.  . Chronic diastolic CHF (congestive heart failure) ([place])   . Depression   . Diverticulosis of colon (without mention of hemorrhage)   . Hypertension   . Hypoalbuminemia   . Internal hemorrhoids without mention of complication   . Ischemic heart disease   . LBBB (left bundle branch block)   . Osteopenia   . Renal insufficiency   . S/P right hip fracture   . Slow transit constipation     Patient Active Problem List   Diagnosis Date Noted  . History of total hip arthroplasty 10/18/2020  . Trochanteric bursitis of right hip 10/18/2020  . Mild major depression, single episode ([place]) 09/26/2020  . Pure hypercholesterolemia 09/26/2020  . Pain of right hip joint 08/07/2020  . Acute non-ST segment elevation myocardial infarction ([place]) 08/02/2020  . Acute on chronic diastolic heart failure ([place])  08/02/2020  . Allergic rhinitis due to pollen 08/02/2020  . Arteriosclerotic heart disease 08/02/2020  . Chronic kidney disease, stage 3a ([place]) 08/02/2020  . Dyslipidemia 08/02/2020  . Hardening of the aorta (main artery of the heart) ([place]) 08/02/2020  . Hearing loss 08/02/2020  . Hypertensive renal disease 08/02/2020  . Hypertensive heart failure ([place]) 08/02/2020  . Intestinal malabsorption 08/02/2020  . Ischemic cardiomyopathy 08/02/2020  . Primary localized osteoarthritis of pelvic region and thigh 08/02/2020  . Spondylosis without myelopathy 08/02/2020  . NSTEMI (non-ST elevated myocardial infarction) ([place]) 05/07/2020  . Postoperative anemia 05/07/2020  . Acute exacerbation of CHF (congestive heart failure) ([place]) 05/06/2020  . Acute respiratory failure with hypoxia ([place]) 05/06/2020  . LBBB (left bundle branch block) 05/06/2020  . Sinus congestion 05/06/2020  . Femur fracture, right ([place]) 04/30/2020  . Fracture of neck of femur ([place]) 04/30/2020  . Chronic abdominal pain 09/23/2011  . Gas pain 09/23/2011  . Constipation, slow transit 09/23/2011  . Anorectal pain 09/23/2011  . GASTRITIS 06/26/2010  . Slow transit constipation 06/21/2010  . RECTAL BLEEDING 06/21/2010  . Vitamin D deficiency 06/20/2010  . Generalized anxiety disorder 06/20/2010  . GLAUCOMA 06/20/2010  . Cataract 06/20/2010  . Benign essential hypertension 06/20/2010  . Chronic sinusitis 06/20/2010  . GERD 06/20/2010  . Disorder of skeletal system 06/20/2010  . Abdominal pain 06/20/2010  . MICROALBUMINURIA 06/20/2010  . Gastroesophageal reflux  disease 06/20/2010    Past Surgical History:  Procedure Laterality Date  . ABDOMINAL HYSTERECTOMY    . CARDIAC CATHETERIZATION    . CATARACT EXTRACTION, BILATERAL    . CORONARY STENT INTERVENTION N/A 05/08/2020   Procedure: CORONARY STENT INTERVENTION;  Surgeon: Martinique, Peter M, MD;  Location: [facility] CV [facility];  Service: Cardiovascular;  Laterality: N/A;  . LEFT  HEART CATH AND CORONARY ANGIOGRAPHY N/A 05/08/2020   Procedure: LEFT HEART CATH AND CORONARY ANGIOGRAPHY;  Surgeon: Martinique, Peter M, MD;  Location: [facility] CV [facility];  Service: Cardiovascular;  Laterality: N/A;  . MASS EXCISION  06/10/2011   Procedure: EXCISION MASS;  Surgeon: Wynonia Sours, MD;  Location: [facility];  Service: Orthopedics;  Laterality: Right;  excision cyst right thumb IP, debridement IP right thumb  . TONSILLECTOMY    . TOTAL HIP ARTHROPLASTY Right 05/02/2020   Procedure: TOTAL HIP ARTHROPLASTY ANTERIOR APPROACH;  Surgeon: Rod Can, MD;  Location: [facility];  Service: Orthopedics;  Laterality: Right;     OB History   No obstetric history on file.     Family History  Problem Relation Age of Onset  . Colon polyps Mother   . Cancer Father        bladder  . Heart disease Father     Social History   Tobacco Use  . Smoking status: Former Smoker    Quit date: 06/05/1984    Years since quitting: 36.5  . Smokeless tobacco: Never Used  Vaping Use  . Vaping Use: Never used  Substance Use Topics  . Alcohol use: No  . Drug use: No    Home Medications Prior to Admission medications   Medication Sig Start Date End Date Taking? Authorizing Provider  albuterol (VENTOLIN HFA) 108 (90 Base) MCG/ACT inhaler Inhale 1-2 puffs into the lungs every 6 (six) hours as needed for wheezing or shortness of breath. 12/01/20   Wieters, Hallie C, PA-C  aspirin 81 MG chewable tablet aspirin 81 mg chewable tablet    [provider]  benzonatate (TESSALON) 200 MG capsule Take 1 capsule (200 mg total) by mouth 3 (three) times daily as needed for up to 7 days for cough. 12/01/20 12/08/20  Wieters, Hallie C, PA-C  Calcium Carbonate-Vit D-Min (CALCIUM 1200 PO) Take 1,200 mg by mouth daily.    [provider]  cefdinir (OMNICEF) 300 MG capsule Take 1 capsule (300 mg total) by mouth 2 (two) times daily. 12/04/20   Vanessa Kick, MD  cholecalciferol (VITAMIN D)  1000 UNITS tablet Take 1,000 Units by mouth daily.    [provider]  clopidogrel (PLAVIX) 75 MG tablet Take 1 tablet (75 mg total) by mouth daily. 08/23/20   Freada Bergeron, MD  dextromethorphan-guaiFENesin [facility] DM) 30-600 MG 12hr tablet Take 1 tablet by mouth 2 (two) times daily. 12/01/20   Wieters, Hallie C, PA-C  docusate sodium (COLACE) 100 MG capsule Take 100 mg by mouth 3 (three) times a week.    [provider]  doxycycline (VIBRAMYCIN) 100 MG capsule Take 1 capsule (100 mg total) by mouth 2 (two) times daily for 7 days. 12/01/20 12/08/20  Wieters, Hallie C, PA-C  esomeprazole (NEXIUM) 40 MG capsule Take 1 capsule (40 mg total) by mouth daily. 08/24/20   Freada Bergeron, MD  ferrous sulfate 325 (65 FE) MG tablet Take 325 mg by mouth See admin instructions. 3 times a week    [provider]  latanoprost (XALATAN) 0.005 % ophthalmic solution  Place 1 drop into both eyes at bedtime.    [provider]  losartan (COZAAR) 50 MG tablet losartan 50 mg tablet  Take 1 tablet (50 mg total) by mouth daily.    [provider]  multivitamin-iron-minerals-folic acid (CENTRUM) chewable tablet Chew 1 tablet by mouth daily.    [provider]  Naphazoline-Pheniramine (OPCON-A OP) Place 1 drop into both eyes daily as needed (for eye allergy).    [provider]  Omega-3 Fatty Acids (FISH OIL) 1000 MG CAPS Take 1,000 mg by mouth daily.    [provider]  sodium chloride (MURO 128) 2 % ophthalmic solution Place 1 drop into both eyes 2 (two) times daily as needed for eye irritation.    [provider]    Allergies    Doxycycline, Penicillins, Prednisone, Statins, Sulfonamide derivatives, and Tramadol  Review of Systems   Review of Systems  Constitutional: Negative for fever.  HENT: Negative for congestion.   Eyes: Negative for visual disturbance.  Respiratory: Positive for shortness of breath.   Cardiovascular:  Negative for chest pain.  Gastrointestinal: Positive for nausea and vomiting. Negative for abdominal pain.  Genitourinary: Negative for difficulty urinating.  Musculoskeletal: Positive for neck pain.  Skin: Negative for rash.  Neurological: Negative for dizziness.  Psychiatric/Behavioral: Negative for agitation.  All other systems reviewed and are negative.   Physical Exam Updated Vital Signs BP 135/88   Pulse (!) 105   Temp 97.9 F (36.6 C) (Oral)   Ht 5\' 1"  (1.549 m)   Wt 54.4 kg   SpO2 93%   BMI 22.66 kg/m   Physical Exam Vitals and nursing note reviewed.  Constitutional:      Appearance: Normal appearance. She is not diaphoretic.  HENT:     Head: Normocephalic and atraumatic.     Nose: Nose normal.  Eyes:     Conjunctiva/sclera: Conjunctivae normal.     Pupils: Pupils are equal, round, and reactive to light.  Cardiovascular:     Rate and Rhythm: Normal rate and regular rhythm.     Pulses: Normal pulses.     Heart sounds: Normal heart sounds.  Pulmonary:     Effort: No respiratory distress.     Breath sounds: No stridor. No wheezing.  Abdominal:     General: Abdomen is flat. Bowel sounds are normal.     Palpations: Abdomen is soft.     Tenderness: There is no abdominal tenderness. There is no guarding.  Musculoskeletal:        General: Normal range of motion.     Cervical back: Normal range of motion and neck supple.  Skin:    General: Skin is warm and dry.     Capillary Refill: Capillary refill takes less than 2 seconds.  Neurological:     General: No focal deficit present.     Mental Status: She is alert and oriented to person, place, and time.     Deep Tendon Reflexes: Reflexes normal.  Psychiatric:        Mood and Affect: Mood normal.        Behavior: Behavior normal.     ED Results / Procedures / Treatments   Labs (all labs ordered are listed, but only abnormal results are displayed) Results for orders placed or performed during the hospital  encounter of 12/06/20  Lipase, blood  Result Value Ref Range   Lipase 34 11 - 51 U/L  Comprehensive metabolic panel  Result Value Ref Range   Sodium  125 (L) 135 - 145 mmol/L   Potassium 3.5 3.5 - 5.1 mmol/L   Chloride 94 (L) 98 - 111 mmol/L   CO2 19 (L) 22 - 32 mmol/L   Glucose, Bld 175 (H) 70 - 99 mg/dL   BUN 15 8 - 23 mg/dL   Creatinine, Ser 0.86 0.44 - 1.00 mg/dL   Calcium 9.2 8.9 - 10.3 mg/dL   Total Protein 6.6 6.5 - 8.1 g/dL   Albumin 3.4 (L) 3.5 - 5.0 g/dL   AST 45 (H) 15 - 41 U/L   ALT 44 0 - 44 U/L   Alkaline Phosphatase 62 38 - 126 U/L   Total Bilirubin 0.7 0.3 - 1.2 mg/dL   GFR, Estimated >60 >60 mL/min   Anion gap 12 5 - 15  CBC  Result Value Ref Range   WBC 8.3 4.0 - 10.5 K/uL   RBC 3.56 (L) 3.87 - 5.11 MIL/uL   Hemoglobin 10.0 (L) 12.0 - 15.0 g/dL   HCT 30.0 (L) 36.0 - 46.0 %   MCV 84.3 80.0 - 100.0 fL   MCH 28.1 26.0 - 34.0 pg   MCHC 33.3 30.0 - 36.0 g/dL   RDW 13.4 11.5 - 15.5 %   Platelets 386 150 - 400 K/uL   nRBC 0.0 0.0 - 0.2 %  Urinalysis, Routine w reflex microscopic Urine, Clean Catch  Result Value Ref Range   Color, Urine YELLOW YELLOW   APPearance CLEAR CLEAR   Specific Gravity, Urine >1.030 (H) 1.005 - 1.030   pH 5.0 5.0 - 8.0   Glucose, UA NEGATIVE NEGATIVE mg/dL   Hgb urine dipstick MODERATE (A) NEGATIVE   Bilirubin Urine NEGATIVE NEGATIVE   Ketones, ur NEGATIVE NEGATIVE mg/dL   Protein, ur 100 (A) NEGATIVE mg/dL   Nitrite NEGATIVE NEGATIVE   Leukocytes,Ua NEGATIVE NEGATIVE  Urinalysis, Microscopic (reflex)  Result Value Ref Range   RBC / HPF 0-5 0 - 5 RBC/hpf   WBC, UA 0-5 0 - 5 WBC/hpf   Bacteria, UA RARE (A) NONE SEEN   Squamous Epithelial / LPF 0-5 0 - 5   Ca Oxalate Crys, UA PRESENT    DG Abdomen Acute W/Chest  Result Date: 12/06/2020 CLINICAL DATA:  87 year old female with nausea and vomiting. EXAM: DG ABDOMEN ACUTE WITH 1 VIEW CHEST COMPARISON:  CT Abdomen and Pelvis 05/06/2020 and earlier. FINDINGS: AP view of the chest.  Small but increased appearance of bilateral pleural effusions since October. Similar mild diffuse increased pulmonary interstitium since that time. Stable cardiac size and mediastinal contours. Heart size at the upper limits of normal. Visualized tracheal air column is within normal limits. No pneumothorax. No air bronchograms. AP portable upright and supine views of the abdomen and pelvis. No pneumoperitoneum. Nonobstructed bowel-gas pattern similar to the prior CT. Chronic bulky 8 mm right nephrolithiasis again noted and appears stable. Other abdominal and pelvic visceral contours are stable. Incidental pelvic phleboliths. Chronic right hip arthroplasty, lumbar spine degeneration and levoconvex scoliosis. No acute osseous abnormality identified. IMPRESSION: 1. Small bilateral effusions appear increased since October with continued diffuse pulmonary interstitial opacity suspicious for edema. 2. Nonobstructed bowel-gas pattern, no free air. 3. 8 mm right renal calculus, previously involving the right renal pelvis, stable since October. Electronically Signed   By: Genevie Ann M.D.   On: 12/06/2020 04:29    EKG None  Radiology DG Abdomen Acute W/Chest  Result Date: 12/06/2020 CLINICAL DATA:  87 year old female with nausea and vomiting. EXAM: DG ABDOMEN ACUTE WITH 1 VIEW CHEST  COMPARISON:  CT Abdomen and Pelvis 05/06/2020 and earlier. FINDINGS: AP view of the chest. Small but increased appearance of bilateral pleural effusions since October. Similar mild diffuse increased pulmonary interstitium since that time. Stable cardiac size and mediastinal contours. Heart size at the upper limits of normal. Visualized tracheal air column is within normal limits. No pneumothorax. No air bronchograms. AP portable upright and supine views of the abdomen and pelvis. No pneumoperitoneum. Nonobstructed bowel-gas pattern similar to the prior CT. Chronic bulky 8 mm right nephrolithiasis again noted and appears stable. Other  abdominal and pelvic visceral contours are stable. Incidental pelvic phleboliths. Chronic right hip arthroplasty, lumbar spine degeneration and levoconvex scoliosis. No acute osseous abnormality identified. IMPRESSION: 1. Small bilateral effusions appear increased since October with continued diffuse pulmonary interstitial opacity suspicious for edema. 2. Nonobstructed bowel-gas pattern, no free air. 3. 8 mm right renal calculus, previously involving the right renal pelvis, stable since October. Electronically Signed   By: Genevie Ann M.D.   On: 12/06/2020 04:29    Procedures Procedures   Medications Ordered in ED Medications  furosemide (LASIX) injection 40 mg (40 mg Intravenous Given 12/06/20 0456)  ondansetron (ZOFRAN) injection 4 mg (4 mg Intravenous Given 12/06/20 0455)  aspirin chewable tablet 324 mg (324 mg Oral Given 12/06/20 8563)    ED Course  I have reviewed the triage vital signs and the nursing notes.  Pertinent labs & imaging results that were available during my care of the patient were reviewed by me and considered in my medical decision making (see chart for details).  510 am case d/w Dub Mikes of cardiology.  No a stemi on EKG   607 Case d/w Dr. Cyd Silence who will admit the patient  Final Clinical Impression(s) / ED Diagnoses Final diagnoses:  None    Admit to medicine    Nickolaus Bordelon, MD 12/06/20 [place], Leno Mathes, MD 12/06/20 1497

## 2020-12-06 NOTE — Consult Note (Signed)
Cardiology Consultation:   Patient ID: Patricia Garcia MRN: [id number]; DOB: 07-07-1933  Admit date: 12/06/2020 Date of Consult: 12/06/2020  PCP:  Lajean Manes, MD   [facility] Providers Cardiologist:  Freada Bergeron, MD   {   Patient Profile:   Patricia Garcia is a 87 y.o. female with a hx of LBBB, HTN, depression, chronic diastolic heart failure, CAD (s/p PCI with DES to RCA and D1 05/08/20), rightfemur fractures/p THA10/13/2021, who is being seen 12/06/2020 for the evaluation of  CHF at the request of Dr. Tamala Julian.   History of Present Illness:   Patricia Garcia sees Dr Johney Frame outpatient for CAD and diastolic HF.   The patient was admitted 04/30/20-05/04/20 for right fmoeral neck fracture after a fall, underwent total right hip arthroplasty on 05/02/2020 by Dr. Lyla Glassing. She was readmitted to [facility] 10/17/21with nausea, vomiting, and hypoxia.She was ultimately found to have aNSTEMI (trops in 3k range)and acute on chronic diastolic heart failure.TTE10/18/2021 showed hypokinesis of the basal inferior and inferolateral wall, EF 50 to [id number], grade 2 diastolic dysfunction, elevated left atrial pressure, mild mitral regurgitation. She underwent cardiac cath 05/08/2020 showing three-vessel obstructive coronary artery disease (95% mid RCA stenosis, 85% prox first diagonal stenosis, and 90% very small LCx in the AV groove) and received DES tomid RCA and first diagonal. She was discharged on DAPT.   She was last evaluated in the office on 10/15/20, reported increased fatigue, DOE, low energy, blood in her stool for a week.  She also stopped metoprolol, losartan, and zetia due to feeling poorly, and not willing to resume. She was switched from ticagrelor to plavix which she is tolerating, along with ASA 81mg . She was not maintained on diuretics and appeared euvolemic at the time.   She came to ER today for c/o nausea, vomiting, SOB wit productive cough, sinus pressure and  congestion with drainage for 2 weeks. She went to urgent care on 5/14 and was prescribed doxycycline for sinusitis/bronchitis, but was not tolerating the medication with GI symptoms.  She is afebrile, tachycardiac 110s, normotensive, and hypoxic requiring 2LNC at ED.  Diagnostic showed hyponatremia 125, bicarb 19, glucose 175. CBC with Hgb 10. Flu and COVID negative. UA with SG >1.03, + protein, bacteria. CXR showed Small bilateral effusions appear increased since October with continued diffuse pulmonary interstitial opacity suspicious for edema. BNP elevated 1701. Hs Trop negative x2. Mag 1.7. EKG with sinus tachycardia with rate of 109bpm, known LBBB, QTc (manual 486 msec), nonspecific changes. She was admitted to hospitalist service for hypoxia due to CHF. Cardiology is consulted for further management.  During encounter, patient states she has not been feeling well for 2 weeks, had ongoing decreased activity tolerance, fatigue, shortness of breath worsened with exertion, dry cough.  She thought it was her chronic sinus problem, went to urgent care and received prescribed doxycycline, was not able to tolerate the medication and experienced significant nausea with vomiting.  She stopped taking the antibiotic, but continued to not feel well, therefore came to the ER for evaluation.  She states over the past 48 hours, she has not been able to sleep because she was increasingly short of breath at night.  She had episodes of panic attack.  She denies any orthopnea, leg edema, weight gain.  She reports poor appetite and low oral intake.  She had received Lasix earlier and is urinating more.  She felt her cough has resolved since she arrived here, and had improved energy and appetite, ate both  of her breakfast and lunch.  She expressed concern that many of her previous medication causing multiple side effects.  She does not wish to take another beta-blocker.  She is compliant with her aspirin and Plavix, states no  further bloody stool observed.     Past Medical History:  Diagnosis Date  . Anal or rectal pain   . Anemia   . Arthritis   . CAD (coronary artery disease)    a. NSTEMI 04/2020 s/p DES to [facility] and D1.  . Chronic diastolic CHF (congestive heart failure) ([place])   . Depression   . Diverticulosis of colon (without mention of hemorrhage)   . Hypertension   . Hypoalbuminemia   . Internal hemorrhoids without mention of complication   . Ischemic heart disease   . LBBB (left bundle branch block)   . Osteopenia   . Renal insufficiency   . S/P right hip fracture   . Slow transit constipation     Past Surgical History:  Procedure Laterality Date  . ABDOMINAL HYSTERECTOMY    . CARDIAC CATHETERIZATION    . CATARACT EXTRACTION, BILATERAL    . CORONARY STENT INTERVENTION N/A 05/08/2020   Procedure: CORONARY STENT INTERVENTION;  Surgeon: Martinique, Peter M, MD;  Location: [place] [facility];  Service: Cardiovascular;  Laterality: N/A;  . LEFT HEART CATH AND CORONARY ANGIOGRAPHY N/A 05/08/2020   Procedure: LEFT HEART CATH AND CORONARY ANGIOGRAPHY;  Surgeon: Martinique, Peter M, MD;  Location: [place] [facility];  Service: Cardiovascular;  Laterality: N/A;  . MASS EXCISION  06/10/2011   Procedure: EXCISION MASS;  Surgeon: Wynonia Sours, MD;  Location: [place];  Service: Orthopedics;  Laterality: Right;  excision cyst right thumb IP, debridement IP right thumb  . TONSILLECTOMY    . TOTAL HIP ARTHROPLASTY Right 05/02/2020   Procedure: TOTAL HIP ARTHROPLASTY ANTERIOR APPROACH;  Surgeon: Rod Can, MD;  Location: [facility];  Service: Orthopedics;  Laterality: Right;     Home Medications:  Prior to Admission medications   Medication Sig Start Date End Date Taking? Authorizing Provider  aspirin 81 MG chewable tablet Chew 81 mg by mouth daily.   Yes [provider]  Calcium Carbonate-Vit D-Min (CALCIUM 1200 PO) Take 1,200 mg by mouth daily.   Yes [provider]   cefdinir (OMNICEF) 300 MG capsule Take 1 capsule (300 mg total) by mouth 2 (two) times daily. 12/04/20  Yes Hagler, Aaron Edelman, MD  cholecalciferol (VITAMIN D) 1000 UNITS tablet Take 1,000 Units by mouth daily.   Yes [provider]  clopidogrel (PLAVIX) 75 MG tablet Take 1 tablet (75 mg total) by mouth daily. 08/23/20  Yes Freada Bergeron, MD  docusate sodium (COLACE) 100 MG capsule Take 100 mg by mouth every other day.   Yes [provider]  esomeprazole (NEXIUM) 40 MG capsule Take 1 capsule (40 mg total) by mouth daily. 08/24/20  Yes Freada Bergeron, MD  ferrous sulfate 325 (65 FE) MG tablet Take 325 mg by mouth See admin instructions. 3 times a week   Yes [provider]  latanoprost (XALATAN) 0.005 % ophthalmic solution Place 1 drop into both eyes at bedtime.   Yes [provider]  multivitamin-iron-minerals-folic acid (CENTRUM) chewable tablet Chew 1 tablet by mouth daily.   Yes [provider]  albuterol (VENTOLIN HFA) 108 (90 Base) MCG/ACT inhaler Inhale 1-2 puffs into the lungs every 6 (six) hours as needed for wheezing or shortness of breath. Patient not taking: No sig  reported 12/01/20   Wieters, Hallie C, PA-C  benzonatate (TESSALON) 200 MG capsule Take 1 capsule (200 mg total) by mouth 3 (three) times daily as needed for up to 7 days for cough. Patient not taking: No sig reported 12/01/20 12/08/20  Wieters, Hallie C, PA-C  dextromethorphan-guaiFENesin (MUCINEX DM) 30-600 MG 12hr tablet Take 1 tablet by mouth 2 (two) times daily. Patient not taking: No sig reported 12/01/20   Wieters, Hallie C, PA-C  doxycycline (VIBRAMYCIN) 100 MG capsule Take 1 capsule (100 mg total) by mouth 2 (two) times daily for 7 days. Patient not taking: No sig reported 12/01/20 12/08/20  Janith Lima, PA-C    Inpatient Medications: Scheduled Meds: . aspirin  81 mg Oral Daily  . clopidogrel  75 mg Oral Daily  . [START ON 12/07/2020] docusate sodium  100 mg Oral  QODAY  . enoxaparin (LOVENOX) injection  40 mg Subcutaneous Q24H  . furosemide  40 mg Intravenous BID  . latanoprost  1 drop Both Eyes QHS  . loratadine  10 mg Oral Daily  . magnesium oxide  400 mg Oral Once  . sodium chloride flush  3 mL Intravenous Q12H   Continuous Infusions: . sodium chloride     PRN Meds: sodium chloride, acetaminophen, sodium chloride flush  Allergies:    Allergies  Allergen Reactions  . Doxycycline Nausea And Vomiting    12/04/20 Pt prefers not to receive Doxycycline for infections  . Penicillins Other (See Comments)    unknown  . Prednisone Other (See Comments)    unknown  . Statins Other (See Comments)    shaking  . Sulfonamide Derivatives Other (See Comments)    unknown  . Tramadol Other (See Comments)    unknown    Social History:   Social History   Socioeconomic History  . Marital status: Widowed    Spouse name: Not on file  . Number of children: 2  . Years of education: 70  . Highest education level: High school graduate  Occupational History  . Occupation: Retired    Fish farm manager: RETIRED  Tobacco Use  . Smoking status: Former Smoker    Quit date: 06/05/1984    Years since quitting: 36.5  . Smokeless tobacco: Never Used  Vaping Use  . Vaping Use: Never used  Substance and Sexual Activity  . Alcohol use: No  . Drug use: No  . Sexual activity: Not on file  Other Topics Concern  . 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 Difficulty of Paying Living Expenses: Not hard at all  Food Insecurity: No Food Insecurity  . Worried About Charity fundraiser in the Last Year: Never true  . Ran Out of Food in the Last Year: Never true  Transportation Needs: No Transportation Needs  . Lack of Transportation (Medical): No  . Lack of Transportation (Non-Medical): No   Physical Activity: Insufficiently Active  . Days of Exercise per Week: 2 days  . Minutes of Exercise per Session: 20 min  Stress: No Stress Concern Present  . Feeling of Stress : Not at all  Social Connections: Moderately Integrated  . Frequency of Communication with Friends and Family: More than three times a week  . Frequency of Social Gatherings with Friends and Family: More than three  times a week  . Attends Religious Services: 1 to 4 times per year  . Active Member of Clubs or Organizations: Yes  . Attends Archivist Meetings: 1 to 4 times per year  . Marital Status: Widowed  Intimate Partner Violence: Not At Risk  . Fear of Current or Ex-Partner: No  . Emotionally Abused: No  . Physically Abused: No  . Sexually Abused: No    Family History:    Family History  Problem Relation Age of Onset  . Colon polyps Mother   . Cancer Father        bladder  . Heart disease Father      ROS:  Constitutional:see HPI Eyes: Denied vision change or loss Ears/Nose/Mouth/Throat: see HPI Cardiovascular: see HPI Respiratory: see HPI Gastrointestinal: see HPI Genital/Urinary: Denied dysuria, hematuria, urinary frequency/urgency Musculoskeletal: Denied muscle ache, joint pain, weakness Skin: Denied rash, wound Neuro: Denied headache, dizziness, syncope Psych: history of depression Endocrine: Denied history of diabetes  Physical Exam/Data:   Vitals:   12/06/20 0815 12/06/20 0909 12/06/20 1026 12/06/20 1249  BP: 130/83 127/78  116/70  Pulse: 100 100  (!) 103  Resp: 17 17    Temp:  98.5 F (36.9 C)    TempSrc:  Oral    SpO2: 98% 100%  96%  Weight:   55.8 kg   Height:        Intake/Output Summary (Last 24 hours) at 12/06/2020 1512 Last data filed at 12/06/2020 1248 Gross per 24 hour  Intake 480 ml  Output 200 ml  Net 280 ml   Last 3 Weights 12/06/2020 12/06/2020 12/04/2020  Weight (lbs) 123 lb 0.3 oz 119 lb 14.9 oz 120 lb  Weight (kg) 55.8 kg 54.4 kg 54.432 kg      Body mass index is 23.24 kg/m.   Vitals:  Vitals:   12/06/20 0909 12/06/20 1249  BP: 127/78 116/70  Pulse: 100 (!) 103  Resp: 17   Temp: 98.5 F (36.9 C)   SpO2: 100% 96%   General Appearance: In no apparent distress, laying in bed HEENT: Normocephalic, atraumatic. EOMs intact. Oral cavity moist  Neck: Supple, trachea midline, right JVDs to upper neck at 45 degree Cardiovascular: Regular rate and rhythm, normal S1-S2,  no murmur/rub/gallop Respiratory: Resting breathing unlabored, lungs sounds with bibasilar fine crackles and rhonchi  To auscultation bilaterally, no use of accessory muscles. On 2LNC Gastrointestinal: Bowel sounds positive, abdomen soft, non-tender, non-distended. No mass or organomegaly.  Extremities: Able to move all extremities in bed without difficulty, no edema/cyanosis/clubbing Genitourinary:  genital exam not performed Musculoskeletal: Normal muscle bulk and tone, muscle strength 5/5 throughout, no limited range of motion, no swollen or erythematous joints Skin: Intact, warm, dry. No rashes or petechiae noted in exposed areas.  Neurologic: Alert, oriented to person, place and time. Fluent speech,no focal muscle weakness, no gross focal neuro deficit Psychiatric: Normal affect. Mood is appropriate.     EKG:  The EKG from 12/06/20 from 5:34AM was personally reviewed and demonstrates:  EKG with sinus tachycardia with rate of 109bpm, known LBBB, QTc (manual 486 msec), nonspecific changes.   Telemetry:  Telemetry was personally reviewed and demonstrates:  Sinus tachycardia 100-117s, LBBB, artifacts   Relevant CV Studies:  Echo is pending today  LHC from 05/08/20:   1st Diag lesion is 85% stenosed.  [place] lesion is 30% stenosed.  Prox Cx to Mid Cx lesion is 90% stenosed.  Prox RCA to Mid RCA lesion is 95% stenosed.  Post intervention,  there is a 0% residual stenosis.  A drug-eluting stent was successfully placed using a STENT RESOLUTE ONYX  3.0X15.  Post intervention, there is a 0% residual stenosis.  A drug-eluting stent was successfully placed using a [place] 2.25X15.  LV end diastolic pressure is mildly elevated.   1. 3 vessel obstructive CAD    - 95% mid RCA- this appeared to be the culprit lesion    - 85% proxima first diagonal    - 90% very small LCx in the AV groove 2. Mildly elevated LVEDP 3. Successful PCI of the mid RCA with DES x 1 4. Successful PCI of the first diagonal with DES x 1  Plan: DAPT for one year. Will transition lasix to po. Further plans per primary team.   Echo from 05/07/20:  1. Hypokinesis of the basal inferior and inferolateral wall with overall  preserved LV function.  2. Left ventricular ejection fraction, by estimation, is 50 to 55%. The  left ventricle has low normal function. The left ventricle demonstrates  regional wall motion abnormalities (see scoring diagram/findings for  description). Left ventricular diastolic  parameters are consistent with Grade II diastolic dysfunction  (pseudonormalization). Elevated left atrial pressure.  3. Right ventricular systolic function is normal. The right ventricular  size is normal. There is mildly elevated pulmonary artery systolic  pressure.  4. Moderate pleural effusion in the left lateral region.  5. The mitral valve is normal in structure. Mild mitral valve  regurgitation. No evidence of mitral stenosis.  6. The aortic valve is tricuspid. Aortic valve regurgitation is trivial.  No aortic stenosis is present.  7. The inferior vena cava is normal in size with greater than 50%  respiratory variability, suggesting right atrial pressure of 3 mmHg.    Laboratory Data:  High Sensitivity Troponin:   Recent Labs  Lab 12/06/20 0521 12/06/20 0630  TROPONINIHS 15 16     Chemistry Recent Labs  Lab 12/06/20 0257  NA 125*  K 3.5  CL 94*  CO2 19*  GLUCOSE 175*  BUN 15  CREATININE 0.86  CALCIUM 9.2  GFRNONAA  >60  ANIONGAP 12    Recent Labs  Lab 12/06/20 0257  PROT 6.6  ALBUMIN 3.4*  AST 45*  ALT 44  ALKPHOS 62  BILITOT 0.7   Hematology Recent Labs  Lab 12/06/20 0257  WBC 8.3  RBC 3.56*  HGB 10.0*  HCT 30.0*  MCV 84.3  MCH 28.1  MCHC 33.3  RDW 13.4  PLT 386   BNP Recent Labs  Lab 12/06/20 0520  BNP 1,701.2*    DDimer No results for input(s): DDIMER in the last 168 hours.   Radiology/Studies:  DG Abdomen Acute W/Chest  Result Date: 12/06/2020 CLINICAL DATA:  87 year old female with nausea and vomiting. EXAM: DG ABDOMEN ACUTE WITH 1 VIEW CHEST COMPARISON:  CT Abdomen and Pelvis 05/06/2020 and earlier. FINDINGS: AP view of the chest. Small but increased appearance of bilateral pleural effusions since October. Similar mild diffuse increased pulmonary interstitium since that time. Stable cardiac size and mediastinal contours. Heart size at the upper limits of normal. Visualized tracheal air column is within normal limits. No pneumothorax. No air bronchograms. AP portable upright and supine views of the abdomen and pelvis. No pneumoperitoneum. Nonobstructed bowel-gas pattern similar to the prior CT. Chronic bulky 8 mm right nephrolithiasis again noted and appears stable. Other abdominal and pelvic visceral contours are stable. Incidental pelvic phleboliths. Chronic right hip arthroplasty, lumbar spine degeneration and levoconvex scoliosis. No  acute osseous abnormality identified. IMPRESSION: 1. Small bilateral effusions appear increased since October with continued diffuse pulmonary interstitial opacity suspicious for edema. 2. Nonobstructed bowel-gas pattern, no free air. 3. 8 mm right renal calculus, previously involving the right renal pelvis, stable since October. Electronically Signed   By: Genevie Ann M.D.   On: 12/06/2020 04:29     Assessment and Plan:   Acute hypoxic respiratory failure - on [facility], wean as tolerated   Acute on chronic diastolic heart failure  - Patient  presented with SOB, cough, congestion for 2 weeks  - Hs Trop negative x2 - BNP 1701 (was 1221 in 05/06/20)  - EKG no acute ischemic findings  - CXR with small pleural effusion and vascular congestion  - Echo repeat pending, EF last reportedly 50 -55 % 10/21 - weight 123 ib POA, appears 123 ib  during office visit on 10/15/20  - clinically hypervolemic today  - started on IV Lasix 40mg  BID (on no diuretic at home), may continue at this dose  - she continue BBlocker, losartan, which limits GDMT, discussed risk versus benefit  - maintain low sodium diet (2g) - monitor strict intake and output, daily weight  - monitor daily electrolytes, keep K >4 and Mag >2  CAD with hx of PCI 05/08/2020  - denied chest pain, Hs trop and EKG negative - last cath as above - continue ASA +Plavix, refused BB, Losartan, and zetia, allergic to statin  - discussed other options of BB and she wants to thick over before start any meds   Hyperlipidemia - LDL 163 on 07/16/20, allergic to statin, not tolerating Zetia, recommend lipid clinic if she is agreeable for PCSK9 inhibitors   Hypertension - BP fairly controlled for her age   QT prolongation  - QTc 485 manually, avoid QT prolongation agents such as zofran, haldol, quinolones etc.   Hyponatremia   Depression Sinusitis  GERD - management per pirmary team    Risk Assessment/Risk Scores:   New York Heart Association (NYHA) Functional Class NYHA Class III        For questions or updates, please contact [facility] Please consult [email or web address] for contact info under    Signed, Margie Billet, NP  12/06/2020 3:12 PM

## 2020-12-06 NOTE — H&P (Signed)
History and Physical    Patricia Garcia SNK:[id number] DOB: March 23, 1933 DOA: 12/06/2020  Referring MD/NP/PA: Inda Merlin, MD PCP: Lajean Manes, MD  Patient coming from: Home  Chief Complaint: Nausea and vomiting  I have personally briefly reviewed patient's old medical records in [place]   HPI: Patricia Garcia is a 87 y.o. female with medical history significant of hypertension, CAD s/p DES x2 93/7902, diastolic CHF, depression, and anemia presents with complaints of nausea and vomiting.  She has been dealing with sinus congestion and drainage over the last 2 weeks.  Noted associated symptoms of sinus pressure, shortness of breath and productive cough.  She was seen at urgent care on 5/14, and was diagnosed with a acute sinusitis with bronchitis prescribe doxycycline 1 tablet twice daily.  However, after getting home patient reported that every time that she took the doxycycline may cause her stomach to be upset her to have nausea and vomiting.  She continue to try and take the medication for the next 4 days.  However her symptoms of nausea and vomiting were not getting better.  Patient reported inability to lay flat at night due to feeling short of breath and was using 2 pillows to prop her self up.  They had attempted to switch her to cephalexin yesterday, but because of her progressive worsening shortness of breath came to the hospital for further evaluation.  Denies having any significant fever, abdominal pain, diarrhea, or leg swelling.  Normally she does not require oxygen at baseline.  ED Course: On admission to the emergency department patient febrile, pulse 92-113, respirations 8-34, blood pressures maintained, and O2 saturations dropped down to mid 80s on room air with improvement into the 90s on 4 L of nasal cannula oxygen.  Labs were significant for hemoglobin 10, sodium 125, CO2 19, glucose 175, albumin 3.4, lipase 34, AST 45, ALT 44, total bilirubin 0.7, high-sensitivity  troponins negative x2, BNP 1701.2.  Chest x-ray was significant for small bilateral pleural effusions increased since October with diffuse interstitial opacity suspicious for edema and stable 8 mm right renal calculus.  Patient was given full dose aspirin, Zofran, and furosemide 40 mg IV.  Review of Systems  Constitutional: Positive for malaise/fatigue. Negative for fever.  HENT: Positive for congestion.   Eyes: Negative for photophobia and discharge.  Respiratory: Positive for cough, sputum production and shortness of breath.   Cardiovascular: Negative for leg swelling.  Gastrointestinal: Positive for nausea and vomiting. Negative for abdominal pain and blood in stool.  Genitourinary: Negative for hematuria and urgency.  Musculoskeletal: Positive for neck pain. Negative for falls.  Skin: Negative for itching and rash.  Neurological: Negative for dizziness, loss of consciousness and weakness.  Psychiatric/Behavioral: Negative for substance abuse.    Past Medical History:  Diagnosis Date  . Anal or rectal pain   . Anemia   . Arthritis   . CAD (coronary artery disease)    a. NSTEMI 04/2020 s/p DES to [facility] and D1.  . Chronic diastolic CHF (congestive heart failure) ([place])   . Depression   . Diverticulosis of colon (without mention of hemorrhage)   . Hypertension   . Hypoalbuminemia   . Internal hemorrhoids without mention of complication   . Ischemic heart disease   . LBBB (left bundle branch block)   . Osteopenia   . Renal insufficiency   . S/P right hip fracture   . Slow transit constipation     Past Surgical History:  Procedure Laterality Date  .  ABDOMINAL HYSTERECTOMY    . CARDIAC CATHETERIZATION    . CATARACT EXTRACTION, BILATERAL    . CORONARY STENT INTERVENTION N/A 05/08/2020   Procedure: CORONARY STENT INTERVENTION;  Surgeon: Martinique, Peter M, MD;  Location: [facility];  Service: Cardiovascular;  Laterality: N/A;  . LEFT HEART CATH AND CORONARY ANGIOGRAPHY N/A  05/08/2020   Procedure: LEFT HEART CATH AND CORONARY ANGIOGRAPHY;  Surgeon: Martinique, Peter M, MD;  Location: [facility];  Service: Cardiovascular;  Laterality: N/A;  . MASS EXCISION  06/10/2011   Procedure: EXCISION MASS;  Surgeon: Wynonia Sours, MD;  Location: [facility];  Service: Orthopedics;  Laterality: Right;  excision cyst right thumb IP, debridement IP right thumb  . TONSILLECTOMY    . TOTAL HIP ARTHROPLASTY Right 05/02/2020   Procedure: TOTAL HIP ARTHROPLASTY ANTERIOR APPROACH;  Surgeon: Rod Can, MD;  Location: [facility];  Service: Orthopedics;  Laterality: Right;     reports that she quit smoking about 36 years ago. She has never used smokeless tobacco. She reports that she does not drink alcohol and does not use drugs.  Allergies  Allergen Reactions  . Doxycycline Nausea And Vomiting    12/04/20 Pt prefers not to receive Doxycycline for infections  . Penicillins Other (See Comments)    unknown  . Prednisone Other (See Comments)    unknown  . Statins Other (See Comments)    shaking  . Sulfonamide Derivatives Other (See Comments)    unknown  . Tramadol Other (See Comments)    unknown    Family History  Problem Relation Age of Onset  . Colon polyps Mother   . Cancer Father        bladder  . Heart disease Father     Prior to Admission medications   Medication Sig Start Date End Date Taking? Authorizing Provider  albuterol (VENTOLIN HFA) 108 (90 Base) MCG/ACT inhaler Inhale 1-2 puffs into the lungs every 6 (six) hours as needed for wheezing or shortness of breath. 12/01/20   Wieters, Hallie C, PA-C  aspirin 81 MG chewable tablet aspirin 81 mg chewable tablet    [provider]  benzonatate (TESSALON) 200 MG capsule Take 1 capsule (200 mg total) by mouth 3 (three) times daily as needed for up to 7 days for cough. 12/01/20 12/08/20  Wieters, Hallie C, PA-C  Calcium Carbonate-Vit D-Min (CALCIUM 1200 PO) Take 1,200 mg by mouth daily.     [provider]  cefdinir (OMNICEF) 300 MG capsule Take 1 capsule (300 mg total) by mouth 2 (two) times daily. 12/04/20   Vanessa Kick, MD  cholecalciferol (VITAMIN D) 1000 UNITS tablet Take 1,000 Units by mouth daily.    [provider]  clopidogrel (PLAVIX) 75 MG tablet Take 1 tablet (75 mg total) by mouth daily. 08/23/20   Freada Bergeron, MD  dextromethorphan-guaiFENesin [facility] DM) 30-600 MG 12hr tablet Take 1 tablet by mouth 2 (two) times daily. 12/01/20   Wieters, Hallie C, PA-C  docusate sodium (COLACE) 100 MG capsule Take 100 mg by mouth 3 (three) times a week.    [provider]  doxycycline (VIBRAMYCIN) 100 MG capsule Take 1 capsule (100 mg total) by mouth 2 (two) times daily for 7 days. 12/01/20 12/08/20  Wieters, Hallie C, PA-C  esomeprazole (NEXIUM) 40 MG capsule Take 1 capsule (40 mg total) by mouth daily. 08/24/20   Freada Bergeron, MD  ferrous sulfate 325 (65 FE) MG tablet Take 325 mg by mouth See  admin instructions. 3 times a week    [provider]  latanoprost (XALATAN) 0.005 % ophthalmic solution Place 1 drop into both eyes at bedtime.    [provider]  losartan (COZAAR) 50 MG tablet losartan 50 mg tablet  Take 1 tablet (50 mg total) by mouth daily.    [provider]  multivitamin-iron-minerals-folic acid (CENTRUM) chewable tablet Chew 1 tablet by mouth daily.    [provider]  Naphazoline-Pheniramine (OPCON-A OP) Place 1 drop into both eyes daily as needed (for eye allergy).    [provider]  Omega-3 Fatty Acids (FISH OIL) 1000 MG CAPS Take 1,000 mg by mouth daily.    [provider]  sodium chloride (MURO 128) 2 % ophthalmic solution Place 1 drop into both eyes 2 (two) times daily as needed for eye irritation.    [provider]    Physical Exam:  Constitutional: NAD, calm, comfortable Vitals:   12/06/20 0545 12/06/20 0600 12/06/20 0615 12/06/20 0700  BP: (!) 144/89  125/79 132/81 118/80  Pulse: (!) 110 96 95 98  Resp: (!) 22 (!) 21 (!) 22 14  Temp:      TempSrc:      SpO2: 96% 97% 98% 99%  Weight:      Height:       Eyes: PERRL, lids and conjunctivae normal ENMT: Mucous membranes are moist. Posterior pharynx clear of any exudate or lesions.Normal dentition.  Neck: normal, supple, no masses, no thyromegaly Respiratory: clear to auscultation bilaterally, no wheezing, no crackles. Normal respiratory effort. No accessory muscle use.  Cardiovascular: Regular rate and rhythm, no murmurs / rubs / gallops. No extremity edema. 2+ pedal pulses. No carotid bruits.  Abdomen: no tenderness, no masses palpated. No hepatosplenomegaly. Bowel sounds positive.  Musculoskeletal: no clubbing / cyanosis. No joint deformity upper and lower extremities. Good ROM, no contractures. Normal muscle tone.  Skin: no rashes, lesions, ulcers. No induration Neurologic: CN 2-12 grossly intact. Sensation intact, DTR normal. Strength 5/5 in all 4.  Psychiatric: Normal judgment and insight. Alert and oriented x 3. Normal mood.     Labs on Admission: I have personally reviewed following labs and imaging studies  CBC: Recent Labs  Lab 12/06/20 0257  WBC 8.3  HGB 10.0*  HCT 30.0*  MCV 84.3  PLT [id number]   Basic Metabolic Panel: Recent Labs  Lab 12/06/20 0257  NA 125*  K 3.5  CL 94*  CO2 19*  GLUCOSE 175*  BUN 15  CREATININE 0.86  CALCIUM 9.2   GFR: Estimated Creatinine Clearance: 36.1 mL/min (by C-G formula based on SCr of 0.86 mg/dL). Liver Function Tests: Recent Labs  Lab 12/06/20 0257  AST 45*  ALT 44  ALKPHOS 62  BILITOT 0.7  PROT 6.6  ALBUMIN 3.4*   Recent Labs  Lab 12/06/20 0257  LIPASE 34   No results for input(s): AMMONIA in the last 168 hours. Coagulation Profile: No results for input(s): INR, PROTIME in the last 168 hours. Cardiac Enzymes: No results for input(s): CKTOTAL, CKMB, CKMBINDEX, TROPONINI in the last 168 hours. BNP (last 3  results) No results for input(s): PROBNP in the last 8760 hours. HbA1C: No results for input(s): HGBA1C in the last 72 hours. CBG: No results for input(s): GLUCAP in the last 168 hours. Lipid Profile: No results for input(s): CHOL, HDL, LDLCALC, TRIG, CHOLHDL, LDLDIRECT in the last 72 hours. Thyroid Function Tests: No results for input(s): TSH, T4TOTAL, FREET4, T3FREE, THYROIDAB in the last 72 hours. Anemia  Panel: No results for input(s): VITAMINB12, FOLATE, FERRITIN, TIBC, IRON, RETICCTPCT in the last 72 hours. Urine analysis:    Component Value Date/Time   COLORURINE YELLOW 12/06/2020 0353   APPEARANCEUR CLEAR 12/06/2020 0353   LABSPEC >1.030 (H) 12/06/2020 0353   PHURINE 5.0 12/06/2020 0353   GLUCOSEU NEGATIVE 12/06/2020 0353   GLUCOSEU NEGATIVE 06/25/2010 1249   HGBUR MODERATE (A) 12/06/2020 0353   BILIRUBINUR NEGATIVE 12/06/2020 0353   KETONESUR NEGATIVE 12/06/2020 0353   PROTEINUR 100 (A) 12/06/2020 0353   UROBILINOGEN 0.2 06/25/2010 1249   NITRITE NEGATIVE 12/06/2020 0353   LEUKOCYTESUR NEGATIVE 12/06/2020 0353   Sepsis Labs: Recent Results (from the past 240 hour(s))  Novel Coronavirus, NAA ([facility])     Status: None   Collection Time: 12/01/20  8:55 AM   Specimen: Nasopharyngeal Swab; Nasopharyngeal(NP) swabs in vial transport medium   Nasopharynge  Result Value Ref Range Status   SARS-CoV-2, NAA Not Detected Not Detected Final    Comment: This nucleic acid amplification test was developed and its performance characteristics determined by [employer]. Nucleic acid amplification tests include RT-PCR and TMA. This test has not been FDA cleared or approved. This test has been authorized by FDA under an Emergency Use Authorization (EUA). This test is only authorized for the duration of time the declaration that circumstances exist justifying the authorization of the emergency use of in vitro diagnostic tests for detection of SARS-CoV-2 virus and/or  diagnosis of COVID-19 infection under section 564(b)(1) of the Act, 21 U.S.C. [id number]) (1), unless the authorization is terminated or revoked sooner. When diagnostic testing is negative, the possibility of a false negative result should be considered in the context of a patient's recent exposures and the presence of clinical signs and symptoms consistent with COVID-19. An individual without symptoms of COVID-19 and who is not shedding SARS-CoV-2 virus wo uld expect to have a negative (not detected) result in this assay.   SARS-COV-2, NAA 2 DAY TAT     Status: None   Collection Time: 12/01/20  8:55 AM   Nasopharynge  Result Value Ref Range Status   SARS-CoV-2, NAA 2 DAY TAT Performed  Final  Resp Panel by RT-PCR (Flu A&B, Covid) Nasopharyngeal Swab     Status: None   Collection Time: 12/06/20  4:36 AM   Specimen: Nasopharyngeal Swab; Nasopharyngeal(NP) swabs in vial transport medium  Result Value Ref Range Status   SARS Coronavirus 2 by RT PCR NEGATIVE NEGATIVE Final    Comment: (NOTE) SARS-CoV-2 target nucleic acids are NOT DETECTED.  The SARS-CoV-2 RNA is generally detectable in upper respiratory specimens during the acute phase of infection. The lowest concentration of SARS-CoV-2 viral copies this assay can detect is 138 copies/mL. A negative result does not preclude SARS-Cov-2 infection and should not be used as the sole basis for treatment or other patient management decisions. A negative result may occur with  improper specimen collection/handling, submission of specimen other than nasopharyngeal swab, presence of viral mutation(s) within the areas targeted by this assay, and inadequate number of viral copies(<138 copies/mL). A negative result must be combined with clinical observations, patient history, and epidemiological information. The expected result is Negative.  Fact Sheet for Patients:  EntrepreneurPulse.com.au  Fact Sheet for Healthcare  Providers:  IncredibleEmployment.be  This test is no t yet approved or cleared by the [place] FDA and  has been authorized for detection and/or diagnosis of SARS-CoV-2 by FDA under an Emergency Use Authorization (EUA). This EUA will remain  in effect (meaning  this test can be used) for the duration of the COVID-19 declaration under Section 564(b)(1) of the Act, 21 U.S.C.section 360bbb-3(b)(1), unless the authorization is terminated  or revoked sooner.       Influenza A by PCR NEGATIVE NEGATIVE Final   Influenza B by PCR NEGATIVE NEGATIVE Final    Comment: (NOTE) The Xpert Xpress SARS-CoV-2/FLU/RSV plus assay is intended as an aid in the diagnosis of influenza from Nasopharyngeal swab specimens and should not be used as a sole basis for treatment. Nasal washings and aspirates are unacceptable for Xpert Xpress SARS-CoV-2/FLU/RSV testing.  Fact Sheet for Patients: EntrepreneurPulse.com.au  Fact Sheet for Healthcare Providers: IncredibleEmployment.be  This test is not yet approved or cleared by the [place] FDA and has been authorized for detection and/or diagnosis of SARS-CoV-2 by FDA under an Emergency Use Authorization (EUA). This EUA will remain in effect (meaning this test can be used) for the duration of the COVID-19 declaration under Section 564(b)(1) of the Act, 21 U.S.C. section 360bbb-3(b)(1), unless the authorization is terminated or revoked.  Performed at [facility], [place] [address] [place], [place] [address]      Radiological Exams on Admission: DG Abdomen Acute W/Chest  Result Date: 12/06/2020 CLINICAL DATA:  87 year old female with nausea and vomiting. EXAM: DG ABDOMEN ACUTE WITH 1 VIEW CHEST COMPARISON:  CT Abdomen and Pelvis 05/06/2020 and earlier. FINDINGS: AP view of the chest. Small but increased appearance of bilateral pleural effusions since October. Similar mild diffuse increased  pulmonary interstitium since that time. Stable cardiac size and mediastinal contours. Heart size at the upper limits of normal. Visualized tracheal air column is within normal limits. No pneumothorax. No air bronchograms. AP portable upright and supine views of the abdomen and pelvis. No pneumoperitoneum. Nonobstructed bowel-gas pattern similar to the prior CT. Chronic bulky 8 mm right nephrolithiasis again noted and appears stable. Other abdominal and pelvic visceral contours are stable. Incidental pelvic phleboliths. Chronic right hip arthroplasty, lumbar spine degeneration and levoconvex scoliosis. No acute osseous abnormality identified. IMPRESSION: 1. Small bilateral effusions appear increased since October with continued diffuse pulmonary interstitial opacity suspicious for edema. 2. Nonobstructed bowel-gas pattern, no free air. 3. 8 mm right renal calculus, previously involving the right renal pelvis, stable since October. Electronically Signed   By: Genevie Ann M.D.   On: 12/06/2020 04:29    EKG: Independently reviewed.  Sinus tachycardia at 107 bpm with QT prolonged at 546  Assessment/Plan Acute respiratory failure with hypoxia secondary to diastolic congestive heart failure exacerbation: Patient found to have concern for bilateral pleural effusions and interstitial opacities concerning for edema.  BNP elevated at 1701.2.  Last EF noted to be 50 to 55% with grade 2 diastolic dysfunction back in 04/2020. -Admit to a telemetry bed -Heart failure orders set  initiated  -Continuous pulse oximetry with nasal cannula oxygen as needed to keep O2 saturations >92% -Strict I&Os and daily weights -Elevate lower extremities -Lasix 40 mg IV Bid -Reassess in a.m. and adjust diuresis as needed. -Check echocardiogram -Cardiology consulted  Nausea and vomiting: Acute.  Thought to be secondary to doxycycline. -Aspiration precaution -Elevate head of bed  Hyponatremia: Acute.  On admission sodium 125.  Unclear  if symptoms are secondary to patient having nausea and vomiting versus hypervolemic hyponatremia given concern for congestive heart failure exacerbation. -Check urine sodium and urine osmolarity -Continue to monitor sodium levels with IV diuresis  CAD: Last cardiac cath in 04/2020 noted three-vessel obstructive coronary artery disease to mid RCA  s/p PCI with DES, proximal first diagonal s/p PCI with DES, and small left circumflex. -Continue aspirin, Plavix, and statin  Sinus infection: Prior to arrival patient was on doxycycline for treatment of a sinus infection.  She was recently switched to cefdinir.  However suspect symptoms were more likely related with her being in congestive heart failure. -Claritin as needed for congestion  Prolonged QT interval: Acute.  QTC 546.  Appears patient has been given Zofran while in the ED. -Avoid QT prolonging medications -Correct electrolyte abnormalities  Nephrolithiasis: Patient with a stable 8 mm calculus of the right renal pelvis.  GERD -Held PPI due to prolonged QT  DVT prophylaxis: Lovenox Code Status: Full Family Communication: None requested Disposition Plan: To be determined Consults called: Message sent for cardiology to evaluate in a.m. Admission status: Inpatient, require more than 2 midnight stay due to need of  Norval Morton MD [facility]   If 7PM-7AM, please contact night-coverage   12/06/2020, 7:17 AM

## 2020-12-06 NOTE — ED Triage Notes (Signed)
Nausea and vomiting x 1 episode last night. Denies any abdominal pain, diarrhea and constipation.

## 2020-12-07 ENCOUNTER — Inpatient Hospital Stay (HOSPITAL_COMMUNITY): Payer: Medicare Other

## 2020-12-07 ENCOUNTER — Encounter (HOSPITAL_COMMUNITY): Payer: Self-pay | Admitting: *Deleted

## 2020-12-07 ENCOUNTER — Ambulatory Visit (HOSPITAL_COMMUNITY): Payer: Medicare Other

## 2020-12-07 DIAGNOSIS — I5041 Acute combined systolic (congestive) and diastolic (congestive) heart failure: Secondary | ICD-10-CM | POA: Diagnosis not present

## 2020-12-07 DIAGNOSIS — I214 Non-ST elevation (NSTEMI) myocardial infarction: Secondary | ICD-10-CM

## 2020-12-07 DIAGNOSIS — Z955 Presence of coronary angioplasty implant and graft: Secondary | ICD-10-CM

## 2020-12-07 DIAGNOSIS — E871 Hypo-osmolality and hyponatremia: Secondary | ICD-10-CM | POA: Diagnosis not present

## 2020-12-07 DIAGNOSIS — I2510 Atherosclerotic heart disease of native coronary artery without angina pectoris: Secondary | ICD-10-CM | POA: Diagnosis not present

## 2020-12-07 DIAGNOSIS — I5021 Acute systolic (congestive) heart failure: Secondary | ICD-10-CM

## 2020-12-07 DIAGNOSIS — J9601 Acute respiratory failure with hypoxia: Secondary | ICD-10-CM | POA: Diagnosis not present

## 2020-12-07 LAB — ECHOCARDIOGRAM COMPLETE
Area-P 1/2: 5.5 cm2
Calc EF: 29.2 %
Height: 61 in
MV M vel: 4.09 m/s
MV Peak grad: 66.9 mmHg
MV VTI: 1.57 cm2
S' Lateral: 4.7 cm
Single Plane A2C EF: 28.6 %
Single Plane A4C EF: 25.4 %
Weight: 1942.4 oz

## 2020-12-07 LAB — BASIC METABOLIC PANEL
Anion gap: 8 (ref 5–15)
BUN: 21 mg/dL (ref 8–23)
CO2: 25 mmol/L (ref 22–32)
Calcium: 8.8 mg/dL — ABNORMAL LOW (ref 8.9–10.3)
Chloride: 94 mmol/L — ABNORMAL LOW (ref 98–111)
Creatinine, Ser: 1.05 mg/dL — ABNORMAL HIGH (ref 0.44–1.00)
GFR, Estimated: 52 mL/min — ABNORMAL LOW (ref >60)
Glucose, Bld: 103 mg/dL — ABNORMAL HIGH (ref 70–99)
Potassium: 3.8 mmol/L (ref 3.5–5.1)
Sodium: 127 mmol/L — ABNORMAL LOW (ref 135–145)

## 2020-12-07 LAB — MAGNESIUM: Magnesium: 1.8 mg/dL (ref 1.7–2.4)

## 2020-12-07 LAB — PLATELET INHIBITION P2Y12: Platelet Function  P2Y12: 35 [PRU] — ABNORMAL LOW (ref 182–335)

## 2020-12-07 MED ORDER — ALUM & MAG HYDROXIDE-SIMETH 200-200-20 MG/5ML PO SUSP
30.0000 mL | ORAL | Status: DC | PRN
  Administered 2020-12-07: 30 mL via ORAL
  Filled 2020-12-07: qty 30

## 2020-12-07 NOTE — Progress Notes (Signed)
Progress Note  Patient Name: Patricia Garcia Date of Encounter: 12/07/2020  [facility] Cardiologist: Freada Bergeron, MD   Subjective   Feeling better.  Breathing is not at baseline.   Inpatient Medications    Scheduled Meds: . aspirin  81 mg Oral Daily  . clopidogrel  75 mg Oral Daily  . docusate sodium  100 mg Oral QODAY  . enoxaparin (LOVENOX) injection  40 mg Subcutaneous Q24H  . furosemide  40 mg Intravenous BID  . latanoprost  1 drop Both Eyes QHS  . loratadine  10 mg Oral Daily  . sodium chloride flush  3 mL Intravenous Q12H   Continuous Infusions: . sodium chloride     PRN Meds: sodium chloride, acetaminophen, sodium chloride flush   Vital Signs    Vitals:   12/06/20 2215 12/07/20 0002 12/07/20 0538 12/07/20 0742  BP: 111/70 111/67 108/73 111/73  Pulse: (!) 110 (!) 107 (!) 103 100  Resp: 18 16 16    Temp: 98.4 F (36.9 C) 98.4 F (36.9 C) 98 F (36.7 C)   TempSrc: Oral Oral Oral   SpO2: 96% 98% 98% 98%  Weight:   55.1 kg   Height:        Intake/Output Summary (Last 24 hours) at 12/07/2020 1133 Last data filed at 12/07/2020 0849 Gross per 24 hour  Intake 840 ml  Output 1050 ml  Net -210 ml   Last 3 Weights 12/07/2020 12/06/2020 12/06/2020  Weight (lbs) 121 lb 6.4 oz 123 lb 0.3 oz 119 lb 14.9 oz  Weight (kg) 55.067 kg 55.8 kg 54.4 kg      Telemetry    Sinus rhythm/sinus tachycardia.  PACs No events - Personally Reviewed  ECG    12/06/20: Sinus tachycardia.  Rate 107 bpm.  First degree AV block.  PACs.  LBBB- Personally Reviewed  Physical Exam   VS:  BP 111/73 (BP Location: Left Wrist)   Pulse 100   Temp 98 F (36.7 C) (Oral)   Resp 16   Ht 5\' 1"  (1.549 m)   Wt 55.1 kg   SpO2 98%   BMI 22.94 kg/m  , BMI Body mass index is 22.94 kg/m. GENERAL:  Well appearing HEENT: Pupils equal round and reactive, fundi not visualized, oral mucosa unremarkable NECK:  No jugular venous distention, waveform within normal limits, carotid upstroke  brisk and symmetric, no bruits LUNGS:  Clear to auscultation bilaterally HEART:  Irregularly irregular.  PMI not displaced or sustained,S1 and S2 within normal limits, no S3, no S4, no clicks, no rubs,  II/VI systolic murmur at the LUSB ABD:  Flat, positive bowel sounds normal in frequency in pitch, no bruits, no rebound, no guarding, no midline pulsatile mass, no hepatomegaly, no splenomegaly EXT:  2 plus pulses throughout, no edema, no cyanosis no clubbing SKIN:  No rashes no nodules NEURO:  Cranial nerves II through XII grossly intact, motor grossly intact throughout [facility]:  Cognitively intact, oriented to person place and time   Labs    High Sensitivity Troponin:   Recent Labs  Lab 12/06/20 0521 12/06/20 0630  TROPONINIHS 15 16      Chemistry Recent Labs  Lab 12/06/20 0257 12/06/20 1547 12/07/20 0504  NA 125* 128* 127*  K 3.5  --  3.8  CL 94*  --  94*  CO2 19*  --  25  GLUCOSE 175*  --  103*  BUN 15  --  21  CREATININE 0.86  --  1.05*  CALCIUM  9.2  --  8.8*  PROT 6.6  --   --   ALBUMIN 3.4*  --   --   AST 45*  --   --   ALT 44  --   --   ALKPHOS 62  --   --   BILITOT 0.7  --   --   GFRNONAA >60  --  52*  ANIONGAP 12  --  8     Hematology Recent Labs  Lab 12/06/20 0257  WBC 8.3  RBC 3.56*  HGB 10.0*  HCT 30.0*  MCV 84.3  MCH 28.1  MCHC 33.3  RDW 13.4  PLT 386    BNP Recent Labs  Lab 12/06/20 0520  BNP 1,701.2*     DDimer No results for input(s): DDIMER in the last 168 hours.   Radiology    DG Abdomen Acute W/Chest  Result Date: 12/06/2020 CLINICAL DATA:  87 year old female with nausea and vomiting. EXAM: DG ABDOMEN ACUTE WITH 1 VIEW CHEST COMPARISON:  CT Abdomen and Pelvis 05/06/2020 and earlier. FINDINGS: AP view of the chest. Small but increased appearance of bilateral pleural effusions since October. Similar mild diffuse increased pulmonary interstitium since that time. Stable cardiac size and mediastinal contours. Heart size at the upper  limits of normal. Visualized tracheal air column is within normal limits. No pneumothorax. No air bronchograms. AP portable upright and supine views of the abdomen and pelvis. No pneumoperitoneum. Nonobstructed bowel-gas pattern similar to the prior CT. Chronic bulky 8 mm right nephrolithiasis again noted and appears stable. Other abdominal and pelvic visceral contours are stable. Incidental pelvic phleboliths. Chronic right hip arthroplasty, lumbar spine degeneration and levoconvex scoliosis. No acute osseous abnormality identified. IMPRESSION: 1. Small bilateral effusions appear increased since October with continued diffuse pulmonary interstitial opacity suspicious for edema. 2. Nonobstructed bowel-gas pattern, no free air. 3. 8 mm right renal calculus, previously involving the right renal pelvis, stable since October. Electronically Signed   By: Genevie Ann M.D.   On: 12/06/2020 04:29    Cardiac Studies   Echo 04/2020: 1. Hypokinesis of the basal inferior and inferolateral wall with overall  preserved LV function.  2. Left ventricular ejection fraction, by estimation, is 50 to 55%. The  left ventricle has low normal function. The left ventricle demonstrates  regional wall motion abnormalities (see scoring diagram/findings for  description). Left ventricular diastolic  parameters are consistent with Grade II diastolic dysfunction  (pseudonormalization). Elevated left atrial pressure.  3. Right ventricular systolic function is normal. The right ventricular  size is normal. There is mildly elevated pulmonary artery systolic  pressure.  4. Moderate pleural effusion in the left lateral region.  5. The mitral valve is normal in structure. Mild mitral valve  regurgitation. No evidence of mitral stenosis.  6. The aortic valve is tricuspid. Aortic valve regurgitation is trivial.  No aortic stenosis is present.  7. The inferior vena cava is normal in size with greater than 50%  respiratory  variability, suggesting right atrial pressure of 3 mmHg.   LHC 04/2020:   1st Diag lesion is 85% stenosed.  Ramus lesion is 30% stenosed.  Prox Cx to Mid Cx lesion is 90% stenosed.  Prox RCA to Mid RCA lesion is 95% stenosed.  Post intervention, there is a 0% residual stenosis.  A drug-eluting stent was successfully placed using a STENT RESOLUTE ONYX 3.0X15.  Post intervention, there is a 0% residual stenosis.  A drug-eluting stent was successfully placed using a [place] 2.25X15.  LV end diastolic pressure is mildly elevated.   1. 3 vessel obstructive CAD    - 95% mid RCA- this appeared to be the culprit lesion    - 85% proxima first diagonal    - 90% very small LCx in the AV groove 2. Mildly elevated LVEDP 3. Successful PCI of the mid RCA with DES x 1 4. Successful PCI of the first diagonal with DES x 1  Plan: DAPT for one year. Will transition lasix to po. Further plans per primary team.  Patient Profile     87 y.o. female  with CAD s/p PCI, hypertension, LBBB, and chronic diastolic heart failure admitted with acute on chronic heart failure.   Assessment & Plan    # Acute on chronic diastolic heart failure:  Ms. Milley was admitted with volume overload and shortness of breath.  BNP was 1700.  She is feeling better but hasn't diuresed much.  Renal function is worsening.  Her IVC is not dilated and she looks euvolemic.  We will stop IV lasix.  Resume oral tomorrow if renal function is trending in the right direction.  She has a pleural effusion on echo and CXR.  Echo shows a reduction in her LVEF from 50-55% 04/2020 to 25-30%.  Given her intolerance to many medications and non-adherence, it is unclear whether she has been compliant with DAPT.  She is taking it in the hospital.  Will check P2Y12 inhibition to see if she is a non-responder.    # CAD s/p PCI: # LBBB:  LBBB is chronic.  Continue aspirin and clopidogrel.  Check P2Y12 inhibition as above.  She had  recent PCI to RCA and D1.  Given her worsened LVEF she will need repeat ischemia evaluation.  However, unless we can guarantee she has been/will be compliant with medications, would not advise repeat PCI.  # Hypertension:  BP is well-controlled.  Ideally she needs GDMT for acute heart failure but she hasn't tolerated many medications.  Will discuss with patient.       For questions or updates, please contact [place] Please consult [email or web address] for contact info under        Signed, Skeet Latch, MD  12/07/2020, 11:33 AM

## 2020-12-07 NOTE — Evaluation (Signed)
Physical Therapy Evaluation Patient Details Name: Patricia Garcia MRN: [id number] DOB: 1933/07/02 Today's Date: 12/07/2020   History of Present Illness  pt is an 87 y/o female presenting 5/19 with n/v and sinus congestion.  pt found to have diastolic CHF.  PMHx HTN, CAD with NSTEMI, diastolic CHF  Clinical Impression  Pt admitted with/for SOB, congestion and found to have diastolic CHF.  Pt is close to baseline function, but is limited by dyspnea and inability to breath deeply.  Pt currently limited functionally due to the problems listed below.  (see problems list.)  Pt will benefit from PT to maximize function and safety to be able to get home safely with available assist .     Follow Up Recommendations No PT follow up;Other (comment) (pt needs to find a structured program after cardiopulmanary rehab is completed.)    Equipment Recommendations  None recommended by PT    Recommendations for Other Services       Precautions / Restrictions Precautions Precautions: None      Mobility  Bed Mobility               General bed mobility comments: in chair on arrival    Transfers Overall transfer level: Needs assistance Equipment used: None Transfers: Sit to/from Stand Sit to Stand: Modified independent (Device/Increase time)            Ambulation/Gait Ambulation/Gait assistance: Supervision Gait Distance (Feet): 240 Feet (with 1 standing rest break.) Assistive device: None Gait Pattern/deviations: Step-through pattern   Gait velocity interpretation: 1.31 - 2.62 ft/sec, indicative of limited community ambulator General Gait Details: generally steady, appropriate gait speed, sats on RA at 93% and HR up into the 120's with mild dyspnea.  Stairs            Wheelchair Mobility    Modified Rankin (Stroke Patients Only)       Balance Overall balance assessment: No apparent balance deficits (not formally assessed)                                            Pertinent Vitals/Pain Pain Assessment: No/denies pain    Home Living Family/patient expects to be discharged to:: Private residence Living Arrangements: Alone Available Help at Discharge: Family;Available 24 hours/day Type of Home: House Home Access: Stairs to enter     Home Layout: Able to live on main level with bedroom/bathroom;Laundry or work area in [place]: Environmental consultant - 2 wheels;Bedside commode;Cane - single point      Prior Function Level of Independence: Independent               Hand Dominance        Extremity/Trunk Assessment   Upper Extremity Assessment Upper Extremity Assessment: Overall WFL for tasks assessed    Lower Extremity Assessment Lower Extremity Assessment: Overall WFL for tasks assessed       Communication   Communication: HOH  Cognition Arousal/Alertness: Awake/alert Behavior During Therapy: WFL for tasks assessed/performed Overall Cognitive Status: Within Functional Limits for tasks assessed                                        General Comments General comments (skin integrity, edema, etc.): encouraged coninued IS, gave feedback on improved use.    Exercises  Assessment/Plan    PT Assessment Patient needs continued PT services  PT Problem List Decreased activity tolerance;Decreased mobility;Cardiopulmonary status limiting activity       PT Treatment Interventions Gait training;Stair training;Functional mobility training;Therapeutic activities;Patient/family education    PT Goals (Current goals can be found in the Care Plan section)  Acute Rehab PT Goals Patient Stated Goal: back independent and able to have a fulfulling social outlet. PT Goal Formulation: With patient Time For Goal Achievement: 12/14/20 Potential to Achieve Goals: Good    Frequency Min 3X/week   Barriers to discharge        Co-evaluation               AM-PAC PT "6 Clicks" Mobility  Outcome  Measure Help needed turning from your back to your side while in a flat bed without using bedrails?: None Help needed moving from lying on your back to sitting on the side of a flat bed without using bedrails?: None Help needed moving to and from a bed to a chair (including a wheelchair)?: None Help needed standing up from a chair using your arms (e.g., wheelchair or bedside chair)?: None Help needed to walk in hospital room?: A Little Help needed climbing 3-5 steps with a railing? : A Little 6 Click Score: 22    End of Session   Activity Tolerance: Patient tolerated treatment well Patient left: in chair;with call bell/phone within reach Nurse Communication: Mobility status PT Visit Diagnosis: Other abnormalities of gait and mobility (R26.89);Difficulty in walking, not elsewhere classified (R26.2)    Time: 4562-5638 PT Time Calculation (min) (ACUTE ONLY): 32 min   Charges:   PT Evaluation $PT Eval Low Complexity: 1 Low PT Treatments $Gait Training: 8-22 mins        12/07/2020  Ginger Carne., PT Acute Rehabilitation Services [phone]  (pager) [phone]  (office)  Tessie Fass Djuna Frechette 12/07/2020, 2:08 PM

## 2020-12-07 NOTE — Consult Note (Signed)
   [facility] Inpatient Consult   12/07/2020  Patricia Garcia Oct 10, 1932 [id number]   [facility] [ACO] Patient: Medicare CMS DCE  Patient was assessed for [facility] for community services. Patient was previously active with [facility].  Met with patient at bedside regarding being restarted with [facility] services. Patient was delightful regarding follow up.     Primary Care Provider:  Dr. Lajean Manes, [facility] Physician, this provider office is listed to provide the [facility] follow up calls and appointment.  Plan:  Offered brochure and patient already has it at home.  Continue to follow progress and refer back to [place] for post hospital follow up.   Of note, [facility] Care Management services does not replace or interfere with any services that are arranged by [facility] care management team.   For additional questions or referrals please contact:  Natividad Brood, RN BSN [facility] Liaison  [phone] business mobile phone Toll free office [phone]  Fax number: [phone] [email or web address]

## 2020-12-07 NOTE — Progress Notes (Signed)
  Echocardiogram 2D Echocardiogram has been performed.  Patricia Garcia 12/07/2020, 11:51 AM

## 2020-12-07 NOTE — Progress Notes (Signed)
PROGRESS NOTE    Patricia Garcia  XHB:[id number] DOB: 1932-12-22 DOA: 12/06/2020 PCP: Lajean Manes, MD     Brief Narrative:  Patricia Garcia is a 87 y.o. female with medical history significant of hypertension, CAD s/p DES x2 81/0175, diastolic CHF, depression, and anemia presents with complaints of nausea and vomiting.  She has been dealing with sinus congestion and drainage over the last 2 weeks.  Noted associated symptoms of sinus pressure, shortness of breath and productive cough.  She was seen at urgent care on 5/14, and was diagnosed with a acute sinusitis with bronchitis prescribe doxycycline 1 tablet twice daily.  However, after getting home patient reported that every time that she took the doxycycline may cause her stomach to be upset her to have nausea and vomiting.  She continue to try and take the medication for the next 4 days.  However her symptoms of nausea and vomiting were not getting better.  Patient reported inability to lay flat at night due to feeling short of breath and was using 2 pillows to prop her self up.  They had attempted to switch her to cephalexin yesterday, but because of her progressive worsening shortness of breath came to the hospital for further evaluation.  Denies having any significant fever, abdominal pain, diarrhea, or leg swelling.  Normally she does not require oxygen at baseline.  Chest x-ray was significant for small bilateral pleural effusions increased since October with diffuse interstitial opacity suspicious for edema and stable 8 mm right renal calculus.  Patient was given full dose aspirin, Zofran, and furosemide 40 mg IV.  New events last 24 hours / Subjective: States that she is feeling better today.  No further nausea or vomiting.  Breathing has improved.  Remains on nasal cannula O2, states that she slept well last night.  Pleased with the medical care that she has received from staff.  She has diuresed about 800 mL yesterday  Assessment &  Plan:   Principal Problem:   Respiratory failure with hypoxia (HCC) Active Problems:   GERD   CAD (coronary artery disease)   Nausea and vomiting   Prolonged QT interval   Hyponatremia   Nephrolithiasis   Acute on chronic diastolic heart failure -BNP 1701.2 -Strict I's and O's, daily weight, fluid restriction diet -Continue IV Lasix -Repeat echocardiogram showed EF 25 to 30%, with regional wall motion abnormality, severely reduced EF compared to previous in 2021 -Cardiology consulted and following  Hyponatremia -Hypervolemic -Improved, continue to monitor while on Lasix  CAD -Continue aspirin, Plavix  Nausea and vomiting -Resolved   DVT prophylaxis:  enoxaparin (LOVENOX) injection 40 mg Start: 12/06/20 1000  Code Status:     Code Status Orders  (From admission, onward)         Start     Ordered   12/06/20 0856  Full code  Continuous        12/06/20 0857        Code Status History    Date Active Date Inactive Code Status Order ID Comments User Context   05/06/2020 1749 05/10/2020 2236 Full Code [id number]  Harold Hedge, MD ED   05/01/2020 0018 05/04/2020 2129 Full Code [id number]  Elwyn Reach, MD ED   Advance Care Planning Activity     Family Communication: No family at bedside Disposition Plan:  Status is: Inpatient  Remains inpatient appropriate because:IV treatments appropriate due to intensity of illness or inability to take PO and Inpatient level of care appropriate due  to severity of illness   Dispo: The patient is from: Home              Anticipated d/c is to: Home              Patient currently is not medically stable to d/c.   Difficult to place patient No      Consultants:   Cardiology     Antimicrobials:  Anti-infectives (From admission, onward)   None        Objective: Vitals:   12/06/20 2215 12/07/20 0002 12/07/20 0538 12/07/20 0742  BP: 111/70 111/67 108/73 111/73  Pulse: (!) 110 (!) 107 (!) 103 100  Resp:  18 16 16    Temp: 98.4 F (36.9 C) 98.4 F (36.9 C) 98 F (36.7 C)   TempSrc: Oral Oral Oral   SpO2: 96% 98% 98% 98%  Weight:   55.1 kg   Height:        Intake/Output Summary (Last 24 hours) at 12/07/2020 1344 Last data filed at 12/07/2020 1317 Gross per 24 hour  Intake 840 ml  Output 1050 ml  Net -210 ml   Filed Weights   12/06/20 0242 12/06/20 1026 12/07/20 0538  Weight: 54.4 kg 55.8 kg 55.1 kg    Examination:  General exam: Appears calm and comfortable  Respiratory system: Crackles bilateral bases, without respiratory distress, no conversational dyspnea, remains on 2 L nasal cannula O2 Cardiovascular system: S1 & S2 heard, RRR. No murmurs. No pedal edema. Gastrointestinal system: Abdomen is nondistended, soft and nontender. Normal bowel sounds heard. Central nervous system: Alert and oriented. No focal neurological deficits. Speech clear.  Extremities: Symmetric in appearance  Skin: No rashes, lesions or ulcers on exposed skin  Psychiatry: Judgement and insight appear normal. Mood & affect appropriate.   Data Reviewed: I have personally reviewed following labs and imaging studies  CBC: Recent Labs  Lab 12/06/20 0257  WBC 8.3  HGB 10.0*  HCT 30.0*  MCV 84.3  PLT [id number]   Basic Metabolic Panel: Recent Labs  Lab 12/06/20 0257 12/06/20 0630 12/06/20 1547 12/07/20 0504  NA 125*  --  128* 127*  K 3.5  --   --  3.8  CL 94*  --   --  94*  CO2 19*  --   --  25  GLUCOSE 175*  --   --  103*  BUN 15  --   --  21  CREATININE 0.86  --   --  1.05*  CALCIUM 9.2  --   --  8.8*  MG  --  1.7  --  1.8   GFR: Estimated Creatinine Clearance: 29.6 mL/min (A) (by C-G formula based on SCr of 1.05 mg/dL (H)). Liver Function Tests: Recent Labs  Lab 12/06/20 0257  AST 45*  ALT 44  ALKPHOS 62  BILITOT 0.7  PROT 6.6  ALBUMIN 3.4*   Recent Labs  Lab 12/06/20 0257  LIPASE 34   No results for input(s): AMMONIA in the last 168 hours. Coagulation Profile: No results for  input(s): INR, PROTIME in the last 168 hours. Cardiac Enzymes: No results for input(s): CKTOTAL, CKMB, CKMBINDEX, TROPONINI in the last 168 hours. BNP (last 3 results) No results for input(s): PROBNP in the last 8760 hours. HbA1C: No results for input(s): HGBA1C in the last 72 hours. CBG: No results for input(s): GLUCAP in the last 168 hours. Lipid Profile: No results for input(s): CHOL, HDL, LDLCALC, TRIG, CHOLHDL, LDLDIRECT in the last 72 hours. Thyroid Function  Tests: No results for input(s): TSH, T4TOTAL, FREET4, T3FREE, THYROIDAB in the last 72 hours. Anemia Panel: No results for input(s): VITAMINB12, FOLATE, FERRITIN, TIBC, IRON, RETICCTPCT in the last 72 hours. Sepsis Labs: No results for input(s): PROCALCITON, LATICACIDVEN in the last 168 hours.  Recent Results (from the past 240 hour(s))  Novel Coronavirus, NAA ([facility])     Status: None   Collection Time: 12/01/20  8:55 AM   Specimen: Nasopharyngeal Swab; Nasopharyngeal(NP) swabs in vial transport medium   Nasopharynge  Result Value Ref Range Status   SARS-CoV-2, NAA Not Detected Not Detected Final    Comment: This nucleic acid amplification test was developed and its performance characteristics determined by [employer]. Nucleic acid amplification tests include RT-PCR and TMA. This test has not been FDA cleared or approved. This test has been authorized by FDA under an Emergency Use Authorization (EUA). This test is only authorized for the duration of time the declaration that circumstances exist justifying the authorization of the emergency use of in vitro diagnostic tests for detection of SARS-CoV-2 virus and/or diagnosis of COVID-19 infection under section 564(b)(1) of the Act, 21 U.S.C. 253GUY-4(I) (1), unless the authorization is terminated or revoked sooner. When diagnostic testing is negative, the possibility of a false negative result should be considered in the context of a patient's recent exposures  and the presence of clinical signs and symptoms consistent with COVID-19. An individual without symptoms of COVID-19 and who is not shedding SARS-CoV-2 virus wo uld expect to have a negative (not detected) result in this assay.   SARS-COV-2, NAA 2 DAY TAT     Status: None   Collection Time: 12/01/20  8:55 AM   Nasopharynge  Result Value Ref Range Status   SARS-CoV-2, NAA 2 DAY TAT Performed  Final  Resp Panel by RT-PCR (Flu A&B, Covid) Nasopharyngeal Swab     Status: None   Collection Time: 12/06/20  4:36 AM   Specimen: Nasopharyngeal Swab; Nasopharyngeal(NP) swabs in vial transport medium  Result Value Ref Range Status   SARS Coronavirus 2 by RT PCR NEGATIVE NEGATIVE Final    Comment: (NOTE) SARS-CoV-2 target nucleic acids are NOT DETECTED.  The SARS-CoV-2 RNA is generally detectable in upper respiratory specimens during the acute phase of infection. The lowest concentration of SARS-CoV-2 viral copies this assay can detect is 138 copies/mL. A negative result does not preclude SARS-Cov-2 infection and should not be used as the sole basis for treatment or other patient management decisions. A negative result may occur with  improper specimen collection/handling, submission of specimen other than nasopharyngeal swab, presence of viral mutation(s) within the areas targeted by this assay, and inadequate number of viral copies(<138 copies/mL). A negative result must be combined with clinical observations, patient history, and epidemiological information. The expected result is Negative.  Fact Sheet for Patients:  EntrepreneurPulse.com.au  Fact Sheet for Healthcare Providers:  IncredibleEmployment.be  This test is no t yet approved or cleared by the [place] FDA and  has been authorized for detection and/or diagnosis of SARS-CoV-2 by FDA under an Emergency Use Authorization (EUA). This EUA will remain  in effect (meaning this test can be used)  for the duration of the COVID-19 declaration under Section 564(b)(1) of the Act, 21 U.S.C.section 360bbb-3(b)(1), unless the authorization is terminated  or revoked sooner.       Influenza A by PCR NEGATIVE NEGATIVE Final   Influenza B by PCR NEGATIVE NEGATIVE Final    Comment: (NOTE) The Xpert Xpress SARS-CoV-2/FLU/RSV plus assay  is intended as an aid in the diagnosis of influenza from Nasopharyngeal swab specimens and should not be used as a sole basis for treatment. Nasal washings and aspirates are unacceptable for Xpert Xpress SARS-CoV-2/FLU/RSV testing.  Fact Sheet for Patients: EntrepreneurPulse.com.au  Fact Sheet for Healthcare Providers: IncredibleEmployment.be  This test is not yet approved or cleared by the [place] FDA and has been authorized for detection and/or diagnosis of SARS-CoV-2 by FDA under an Emergency Use Authorization (EUA). This EUA will remain in effect (meaning this test can be used) for the duration of the COVID-19 declaration under Section 564(b)(1) of the Act, 21 U.S.C. section 360bbb-3(b)(1), unless the authorization is terminated or revoked.  Performed at [facility], [place] [address] [place], [place] [address]       Radiology Studies: DG Abdomen Acute W/Chest  Result Date: 12/06/2020 CLINICAL DATA:  87 year old female with nausea and vomiting. EXAM: DG ABDOMEN ACUTE WITH 1 VIEW CHEST COMPARISON:  CT Abdomen and Pelvis 05/06/2020 and earlier. FINDINGS: AP view of the chest. Small but increased appearance of bilateral pleural effusions since October. Similar mild diffuse increased pulmonary interstitium since that time. Stable cardiac size and mediastinal contours. Heart size at the upper limits of normal. Visualized tracheal air column is within normal limits. No pneumothorax. No air bronchograms. AP portable upright and supine views of the abdomen and pelvis. No pneumoperitoneum. Nonobstructed  bowel-gas pattern similar to the prior CT. Chronic bulky 8 mm right nephrolithiasis again noted and appears stable. Other abdominal and pelvic visceral contours are stable. Incidental pelvic phleboliths. Chronic right hip arthroplasty, lumbar spine degeneration and levoconvex scoliosis. No acute osseous abnormality identified. IMPRESSION: 1. Small bilateral effusions appear increased since October with continued diffuse pulmonary interstitial opacity suspicious for edema. 2. Nonobstructed bowel-gas pattern, no free air. 3. 8 mm right renal calculus, previously involving the right renal pelvis, stable since October. Electronically Signed   By: Genevie Ann M.D.   On: 12/06/2020 04:29      Scheduled Meds: . aspirin  81 mg Oral Daily  . clopidogrel  75 mg Oral Daily  . docusate sodium  100 mg Oral QODAY  . enoxaparin (LOVENOX) injection  40 mg Subcutaneous Q24H  . furosemide  40 mg Intravenous BID  . latanoprost  1 drop Both Eyes QHS  . loratadine  10 mg Oral Daily  . sodium chloride flush  3 mL Intravenous Q12H   Continuous Infusions: . sodium chloride       LOS: 1 day      Time spent: 25 minutes   Dessa Phi, DO [facility] 12/07/2020, 1:44 PM   Available via Epic secure chat 7am-7pm After these hours, please refer to coverage provider listed on [email or web address]

## 2020-12-07 NOTE — Progress Notes (Incomplete)
Progress Note  Patient Name: Patricia Garcia Date of Encounter: 12/07/2020  [facility] Cardiologist: Freada Bergeron, MD ***  Subjective   ***  Inpatient Medications    Scheduled Meds: . aspirin  81 mg Oral Daily  . clopidogrel  75 mg Oral Daily  . docusate sodium  100 mg Oral QODAY  . enoxaparin (LOVENOX) injection  40 mg Subcutaneous Q24H  . furosemide  40 mg Intravenous BID  . latanoprost  1 drop Both Eyes QHS  . loratadine  10 mg Oral Daily  . sodium chloride flush  3 mL Intravenous Q12H   Continuous Infusions: . sodium chloride     PRN Meds: sodium chloride, acetaminophen, sodium chloride flush   Vital Signs    Vitals:   12/06/20 2215 12/07/20 0002 12/07/20 0538 12/07/20 0742  BP: 111/70 111/67 108/73 111/73  Pulse: (!) 110 (!) 107 (!) 103 100  Resp: 18 16 16    Temp: 98.4 F (36.9 C) 98.4 F (36.9 C) 98 F (36.7 C)   TempSrc: Oral Oral Oral   SpO2: 96% 98% 98% 98%  Weight:   55.1 kg   Height:        Intake/Output Summary (Last 24 hours) at 12/07/2020 0959 Last data filed at 12/07/2020 0849 Gross per 24 hour  Intake 1080 ml  Output 1250 ml  Net -170 ml   Last 3 Weights 12/07/2020 12/06/2020 12/06/2020  Weight (lbs) 121 lb 6.4 oz 123 lb 0.3 oz 119 lb 14.9 oz  Weight (kg) 55.067 kg 55.8 kg 54.4 kg      Telemetry    *** - Personally Reviewed  ECG    *** - Personally Reviewed  Physical Exam  *** GEN: No acute distress.   Neck: No JVD Cardiac: RRR, no murmurs, rubs, or gallops.  Respiratory: Clear to auscultation bilaterally. GI: Soft, nontender, non-distended  MS: No edema; No deformity. Neuro:  Nonfocal  Psych: Normal affect   Labs    High Sensitivity Troponin:   Recent Labs  Lab 12/06/20 0521 12/06/20 0630  TROPONINIHS 15 16      Chemistry Recent Labs  Lab 12/06/20 0257 12/06/20 1547 12/07/20 0504  NA 125* 128* 127*  K 3.5  --  3.8  CL 94*  --  94*  CO2 19*  --  25  GLUCOSE 175*  --  103*  BUN 15  --  21   CREATININE 0.86  --  1.05*  CALCIUM 9.2  --  8.8*  PROT 6.6  --   --   ALBUMIN 3.4*  --   --   AST 45*  --   --   ALT 44  --   --   ALKPHOS 62  --   --   BILITOT 0.7  --   --   GFRNONAA >60  --  52*  ANIONGAP 12  --  8     Hematology Recent Labs  Lab 12/06/20 0257  WBC 8.3  RBC 3.56*  HGB 10.0*  HCT 30.0*  MCV 84.3  MCH 28.1  MCHC 33.3  RDW 13.4  PLT 386    BNP Recent Labs  Lab 12/06/20 0520  BNP 1,701.2*     DDimer No results for input(s): DDIMER in the last 168 hours.   Radiology    DG Abdomen Acute W/Chest  Result Date: 12/06/2020 CLINICAL DATA:  87 year old female with nausea and vomiting. EXAM: DG ABDOMEN ACUTE WITH 1 VIEW CHEST COMPARISON:  CT Abdomen and Pelvis 05/06/2020 and  earlier. FINDINGS: AP view of the chest. Small but increased appearance of bilateral pleural effusions since October. Similar mild diffuse increased pulmonary interstitium since that time. Stable cardiac size and mediastinal contours. Heart size at the upper limits of normal. Visualized tracheal air column is within normal limits. No pneumothorax. No air bronchograms. AP portable upright and supine views of the abdomen and pelvis. No pneumoperitoneum. Nonobstructed bowel-gas pattern similar to the prior CT. Chronic bulky 8 mm right nephrolithiasis again noted and appears stable. Other abdominal and pelvic visceral contours are stable. Incidental pelvic phleboliths. Chronic right hip arthroplasty, lumbar spine degeneration and levoconvex scoliosis. No acute osseous abnormality identified. IMPRESSION: 1. Small bilateral effusions appear increased since October with continued diffuse pulmonary interstitial opacity suspicious for edema. 2. Nonobstructed bowel-gas pattern, no free air. 3. 8 mm right renal calculus, previously involving the right renal pelvis, stable since October. Electronically Signed   By: Genevie Ann M.D.   On: 12/06/2020 04:29    Cardiac Studies   ***  Patient Profile     87  y.o. female with PMH of HTN, LBBB, chronic diastolic CHF, CAD s/p DES to RCA and D1 in Oct 2021, and R femur fracture s/p THA 05/02/2020 presented with worsening dyspnea.   Assessment & Plan    1. Acute on chronic diastolic heart failure  - BNP 1701   2. CAD s/p PCI 05/08/2020  3. HTN  4. HLD  5. QT prolongation  {Are we signing off today?:[id number]}  For questions or updates, please contact [place] Please consult [email or web address] for contact info under        Signed, Almyra Deforest, [place]  12/07/2020, 9:59 AM

## 2020-12-07 NOTE — Progress Notes (Signed)
Cardiac Individual Treatment Plan  Patient Details  Name: Patricia Garcia MRN: [id number] Date of Birth: [birth year] Referring Provider:   Flowsheet Row CARDIAC REHAB PHASE II ORIENTATION from 09/25/2020 in [facility]  Referring Provider Freada Bergeron, MD      Initial Encounter Date:  [place] PHASE II ORIENTATION from 09/25/2020 in [facility]  Date 09/25/20      Visit Diagnosis: NSTEMI 05/06/20  05/08/20 S/P DES RCA and Diagonal  Patient's Home Medications on Admission: No current facility-administered medications for this visit. No current outpatient medications on file.  Facility-Administered Medications Ordered in Other Visits:  .  0.9 %  sodium chloride infusion, 250 mL, Intravenous, PRN, Troy Sine, MD .  acetaminophen (TYLENOL) tablet 650 mg, 650 mg, Oral, Q4H PRN, Tamala Julian, Rondell A, MD .  alum & mag hydroxide-simeth (MAALOX/MYLANTA) 200-200-20 MG/5ML suspension 30 mL, 30 mL, Oral, Q4H PRN, Dessa Phi, DO, 30 mL at 12/07/20 2153 .  aspirin chewable tablet 81 mg, 81 mg, Oral, Daily, Tamala Julian, Rondell A, MD, 81 mg at 12/11/20 0927 .  bisoprolol (ZEBETA) tablet 2.5 mg, 2.5 mg, Oral, Daily, Branch, Alphonse Guild, MD, 2.5 mg at 12/11/20 0928 .  clopidogrel (PLAVIX) tablet 75 mg, 75 mg, Oral, Daily, Tamala Julian, Rondell A, MD, 75 mg at 12/11/20 0928 .  diazepam (VALIUM) tablet 2 mg, 2 mg, Oral, Q6H PRN, Troy Sine, MD, 2 mg at 12/11/20 0223 .  docusate sodium (COLACE) capsule 100 mg, 100 mg, Oral, QODAY, Smith, Rondell A, MD, 100 mg at 12/11/20 0927 .  enoxaparin (LOVENOX) injection 30 mg, 30 mg, Subcutaneous, Q24H, Troy Sine, MD, 30 mg at 12/11/20 2979 .  furosemide (LASIX) tablet 40 mg, 40 mg, Oral, Daily, Arnoldo Lenis, MD, 40 mg at 12/11/20 8921 .  latanoprost 0.005% ophthalmic solution (using own supply) 1 drop, 1 drop, Both Eyes, QHS, Smith, Rondell A, MD, 1 drop at 12/10/20  2032 .  loratadine (CLARITIN) tablet 10 mg, 10 mg, Oral, Daily, Tamala Julian, Rondell A, MD, 10 mg at 12/11/20 0928 .  ondansetron (ZOFRAN) injection 4 mg, 4 mg, Intravenous, Q6H PRN, Troy Sine, MD .  prochlorperazine (COMPAZINE) injection 10 mg, 10 mg, Intravenous, Q6H PRN, Dessa Phi, DO, 10 mg at 12/10/20 1329 .  sodium chloride flush (NS) 0.9 % injection 3 mL, 3 mL, Intravenous, Q12H, Arnoldo Lenis, MD, 3 mL at 12/10/20 2053 .  sodium chloride flush (NS) 0.9 % injection 3 mL, 3 mL, Intravenous, Q12H, Troy Sine, MD, 3 mL at 12/11/20 0940 .  sodium chloride flush (NS) 0.9 % injection 3 mL, 3 mL, Intravenous, PRN, Troy Sine, MD  Past Medical History: Past Medical History:  Diagnosis Date  . Anal or rectal pain   . Anemia   . Arthritis   . CAD (coronary artery disease)    a. NSTEMI 04/2020 s/p DES to [facility] and D1.  . Chronic diastolic CHF (congestive heart failure) ([place])   . Depression   . Diverticulosis of colon (without mention of hemorrhage)   . Hypertension   . Hypoalbuminemia   . Internal hemorrhoids without mention of complication   . Ischemic heart disease   . LBBB (left bundle branch block)   . Osteopenia   . Renal insufficiency   . S/P right hip fracture   . Slow transit constipation     Tobacco Use: Social History   Tobacco Use  Smoking Status Former Smoker  .  Quit date: 06/05/1984  . Years since quitting: 36.5  Smokeless Tobacco Never Used    Labs: Recent Chemical engineer    Labs for ITP Cardiac and Pulmonary Rehab Latest Ref Rng & Units 07/16/2020 12/10/2020 12/10/2020 12/10/2020 12/10/2020   Cholestrol 0 - 200 mg/dL 257(H) 180 - - -   LDLCALC 0 - 99 mg/dL 163(H) 111(H) - - -   HDL >40 mg/dL 49 40(L) - - -   Trlycerides <150 mg/dL 240(H) 147 - - -   Hemoglobin A1c 4.8 - 5.6 % - - - - -   PHART 7.350 - 7.450 - - - - 7.485(H)   PCO2ART 32.0 - 48.0 mmHg - - - - 33.4   HCO3 20.0 - 28.0 mmol/L - - 28.2(H) 28.2(H) 25.1   TCO2 22 - 32 mmol/L  - - 29 29 26    O2SAT % - - 76.0 70.0 99.0      Capillary Blood Glucose: No results found for: GLUCAP   Exercise Target Goals: Exercise Program Goal: Individual exercise prescription set using results from initial 6 min walk test and THRR while considering  patient's activity barriers and safety.   Exercise Prescription Goal: Starting with aerobic activity 30 plus minutes a day, 3 days per week for initial exercise prescription. Provide home exercise prescription and guidelines that participant acknowledges understanding prior to discharge.  Activity Barriers & Risk Stratification:   6 Minute Walk:  6 Minute Walk    Row Name 11/16/20 1330         6 Minute Walk   Phase Discharge     Distance 1200 feet     Distance % Change 10.4 %     Distance Feet Change 125 ft     Walk Time 6 minutes     # of Rest Breaks 0     MPH 2.27     METS 2.37     RPE 11     Perceived Dyspnea  1     VO2 Peak 8.32     Symptoms Yes (comment)     Comments SOB, RPD = !     Resting HR 92 bpm     Resting BP 134/70     Resting Oxygen Saturation  98 %     Exercise Oxygen Saturation  during 6 min walk 98 %     Max Ex. HR 137 bpm     Max Ex. BP 132/52            Oxygen Initial Assessment:   Oxygen Re-Evaluation:   Oxygen Discharge (Final Oxygen Re-Evaluation):   Initial Exercise Prescription:   Perform Capillary Blood Glucose checks as needed.  Exercise Prescription Changes:   Exercise Prescription Changes    Row Name 10/12/20 1500 11/02/20 1445           Response to Exercise   Blood Pressure (Admit) 120/68 122/66      Blood Pressure (Exercise) 124/62 108/72      Blood Pressure (Exit) 118/64 128/68      Heart Rate (Admit) 99 bpm 98 bpm      Heart Rate (Exercise) 135 bpm 140 bpm      Heart Rate (Exit) 98 bpm 98 bpm      Rating of Perceived Exertion (Exercise) 12 11      Symptoms None None      Comments Reviewed METs Reviewed METs, Goals, and Home exercise Rx      Duration  Continue with 30 min of aerobic exercise  without signs/symptoms of physical distress. Continue with 30 min of aerobic exercise without signs/symptoms of physical distress.      Intensity THRR unchanged THRR unchanged             Progression   Progression Continue to progress workloads to maintain intensity without signs/symptoms of physical distress. Continue to progress workloads to maintain intensity without signs/symptoms of physical distress.      Average METs 2.2 2.2             Resistance Training   Training Prescription No Yes      Weight 2 lbs 2 lbs      Reps 10-15 10-15      Time 10 Minutes 10 Minutes             Interval Training   Interval Training No No             NuStep   Level 2 2      SPM 75 75      Minutes 23 17      METs 1.7 1.6             Track   Laps 6 10      Minutes 5 10      METs 2.74 2.74             Home Exercise Plan   Plans to continue exercise at -- Home (comment)      Frequency -- Add 2 additional days to program exercise sessions.      Initial Home Exercises Provided -- 11/02/20             Exercise Comments:   Exercise Comments    Row Name 10/12/20 1450 10/16/20 1416 11/02/20 1445       Exercise Comments Reviewed METs. Pt is making good progress in the CRP2 program . Reviewed METs. Goals and home exercise Rx. Pt voices that she is making progress and has more energy and endurnace. She voices feeling stronger and that her housework does not make her as tired as before staring the exercise. Pt voices motivation for the exercise ans walks at home in her driveway and cul-de-sac. She voices she carries her cell phone and house phone for safety.            Exercise Goals and Review:   Exercise Goals Re-Evaluation :  Exercise Goals Re-Evaluation    Row Name 11/02/20 1445             Exercise Goal Re-Evaluation   Exercise Goals Review Increase Physical Activity;Increase Strength and Stamina;Able to understand and use rate of  perceived exertion (RPE) scale;Knowledge and understanding of Target Heart Rate Range (THRR);Understanding of Exercise Prescription       Comments Reviewed METs, goals, and Home exercise Rx with patient today. Pt voices she is walking at home in her driveway and cul-de-sac already.       Expected Outcomes Pt will continue to walk at home.               Discharge Exercise Prescription (Final Exercise Prescription Changes):  Exercise Prescription Changes - 11/02/20 1445      Response to Exercise   Blood Pressure (Admit) 122/66    Blood Pressure (Exercise) 108/72    Blood Pressure (Exit) 128/68    Heart Rate (Admit) 98 bpm    Heart Rate (Exercise) 140 bpm    Heart Rate (Exit) 98 bpm    Rating of Perceived Exertion (Exercise) 11  Symptoms None    Comments Reviewed METs, Goals, and Home exercise Rx    Duration Continue with 30 min of aerobic exercise without signs/symptoms of physical distress.    Intensity THRR unchanged      Progression   Progression Continue to progress workloads to maintain intensity without signs/symptoms of physical distress.    Average METs 2.2      Resistance Training   Training Prescription Yes    Weight 2 lbs    Reps 10-15    Time 10 Minutes      Interval Training   Interval Training No      NuStep   Level 2    SPM 75    Minutes 17    METs 1.6      Track   Laps 10    Minutes 10    METs 2.74      Home Exercise Plan   Plans to continue exercise at Home (comment)    Frequency Add 2 additional days to program exercise sessions.    Initial Home Exercises Provided 11/02/20           Nutrition:  Target Goals: Understanding of nutrition guidelines, daily intake of sodium 1500mg , cholesterol 200mg , calories 30% from fat and 7% or less from saturated fats, daily to have 5 or more servings of fruits and vegetables.  Biometrics:    Nutrition Therapy Plan and Nutrition Goals:  Nutrition Therapy & Goals - 10/24/20 1459      Nutrition  Therapy   Diet High calorie, high protein      Personal Nutrition Goals   Nutrition Goal Pt to identify food quantities necessary to achieve weight gain of 0.5 lbs per week lb while in cardiac rehab    Personal Goal #2 Pt to build a healthy plate including vegetables, fruits, whole grains, and low-fat dairy products in a heart healthy meal plan.    Personal Goal #3 Add healthy fats to meals and shakes      Intervention Plan   Intervention Prescribe, educate and counsel regarding individualized specific dietary modifications aiming towards targeted core components such as weight, hypertension, lipid management, diabetes, heart failure and other comorbidities.;Nutrition handout(s) given to patient.    Expected Outcomes Short Term Goal: Understand basic principles of dietary content, such as calories, fat, sodium, cholesterol and nutrients.;Long Term Goal: Adherence to prescribed nutrition plan.           Nutrition Assessments:  MEDIFICTS Score Key:  ?70 Need to make dietary changes   40-70 Heart Healthy Diet  ? 40 Therapeutic Level Cholesterol Diet  Flowsheet Row CARDIAC REHAB PHASE II EXERCISE from 10/24/2020 in [place]  Picture Your Plate Total Score on Admission 63     Picture Your Plate Scores:  [id number] Unhealthy dietary pattern with much room for improvement.  41-50 Dietary pattern unlikely to meet recommendations for good health and room for improvement.  51-60 More healthful dietary pattern, with some room for improvement.   >60 Healthy dietary pattern, although there may be some specific behaviors that could be improved.    Nutrition Goals Re-Evaluation:  Nutrition Goals Re-Evaluation    [place] Name 10/24/20 1501 11/09/20 1517 12/10/20 1521         Goals   Current Weight 123 lb (55.8 kg) 122 lb 12.7 oz (55.7 kg) 120 lb 13 oz (54.8 kg)     Nutrition Goal Pt to identify food quantities necessary to achieve weight gain of 0.5 lbs per  week  lb while in cardiac rehab Pt to identify food quantities necessary to achieve weight gain of 0.5 lbs per week lb while in cardiac rehab Pt to identify food quantities necessary to achieve weight gain of 0.5 lbs per week lb while in cardiac rehab     Comment -- -- Pt has lost 4 lbs since starting rehab.           Personal Goal #2 Re-Evaluation   Personal Goal #2 Pt to build a healthy plate including vegetables, fruits, whole grains, and low-fat dairy products in a heart healthy meal plan. Pt to build a healthy plate including vegetables, fruits, whole grains, and low-fat dairy products in a heart healthy meal plan. Pt to build a healthy plate including vegetables, fruits, whole grains, and low-fat dairy products in a heart healthy meal plan.           Personal Goal #3 Re-Evaluation   Personal Goal #3 Add healthy fats to meals and shakes Add healthy fats to meals and shakes Add healthy fats to meals and shakes            Nutrition Goals Discharge (Final Nutrition Goals Re-Evaluation):  Nutrition Goals Re-Evaluation - 12/10/20 1521      Goals   Current Weight 120 lb 13 oz (54.8 kg)    Nutrition Goal Pt to identify food quantities necessary to achieve weight gain of 0.5 lbs per week lb while in cardiac rehab    Comment Pt has lost 4 lbs since starting rehab.      Personal Goal #2 Re-Evaluation   Personal Goal #2 Pt to build a healthy plate including vegetables, fruits, whole grains, and low-fat dairy products in a heart healthy meal plan.      Personal Goal #3 Re-Evaluation   Personal Goal #3 Add healthy fats to meals and shakes           Psychosocial: Target Goals: Acknowledge presence or absence of significant depression and/or stress, maximize coping skills, provide positive support system. Participant is able to verbalize types and ability to use techniques and skills needed for reducing stress and depression.  Initial Review & Psychosocial Screening:   Quality of Life  Scores:  Scores of 19 and below usually indicate a poorer quality of life in these areas.  A difference of  2-3 points is a clinically meaningful difference.  A difference of 2-3 points in the total score of the Quality of Life Index has been associated with significant improvement in overall quality of life, self-image, physical symptoms, and general health in studies assessing change in quality of life.  PHQ-9: Recent Review Flowsheet Data    Depression screen [facility] 2/9 10/25/2020 09/27/2020 09/26/2020 08/27/2020 08/06/2020   Decreased Interest 0 0 0 1 1   Down, Depressed, Hopeless 0 0 0 1 1   PHQ - 2 Score 0 0 0 2 2   Altered sleeping - - - 2 1   Tired, decreased energy - - - 1 1   Change in appetite - - - 1 1   Feeling bad or failure about yourself  - - - 0 0   Trouble concentrating - - - 0 0   Moving slowly or fidgety/restless - - - 0 0   Suicidal thoughts - - - 0 0   PHQ-9 Score - - - 6 5   Difficult doing work/chores - - - Somewhat difficult Somewhat difficult     Interpretation of Total Score  Total Score  Depression Severity:  1-4 = Minimal depression, 5-9 = Mild depression, 10-14 = Moderate depression, 15-19 = Moderately severe depression, 20-27 = Severe depression   Psychosocial Evaluation and Intervention:   Psychosocial Re-Evaluation:  Psychosocial Re-Evaluation    Row Name 10/16/20 1701 11/13/20 1550 12/07/20 1438         Psychosocial Re-Evaluation   Current issues with History of Depression History of Depression History of Depression     Comments Phyliss as not voiced any increased stressors or depression Phyliss as not voiced any increased stressors or depression Phylis is currently admitted on [place] with a CHF exacerbation     Expected Outcomes Ferol will continue have decreased stressors upon completion of phase 2 cardiac rehab Lawsyn will continue have decreased stressors upon completion of phase 2 cardiac rehab Fadwa will continue have decreased stressors upon  completion of phase 2 cardiac rehab     Interventions Encouraged to attend Cardiac Rehabilitation for the exercise;Stress management education Encouraged to attend Cardiac Rehabilitation for the exercise;Stress management education Encouraged to attend Cardiac Rehabilitation for the exercise;Stress management education     Continue Psychosocial Services  Follow up required by staff Follow up required by staff Follow up required by staff            Psychosocial Discharge (Final Psychosocial Re-Evaluation):  Psychosocial Re-Evaluation - 12/07/20 1438      Psychosocial Re-Evaluation   Current issues with History of Depression    Comments Phylis is currently admitted on [place] with a CHF exacerbation    Expected Outcomes Shayann will continue have decreased stressors upon completion of phase 2 cardiac rehab    Interventions Encouraged to attend Cardiac Rehabilitation for the exercise;Stress management education    Continue Psychosocial Services  Follow up required by staff           Vocational Rehabilitation: Provide vocational rehab assistance to qualifying candidates.   Vocational Rehab Evaluation & Intervention:   Education: Education Goals: Education classes will be provided on a weekly basis, covering required topics. Participant will state understanding/return demonstration of topics presented.  Learning Barriers/Preferences:   Education Topics: Hypertension, Hypertension Reduction -Define heart disease and high blood pressure. Discus how high blood pressure affects the body and ways to reduce high blood pressure.   Exercise and Your Heart -Discuss why it is important to exercise, the FITT principles of exercise, normal and abnormal responses to exercise, and how to exercise safely.   Angina -Discuss definition of angina, causes of angina, treatment of angina, and how to decrease risk of having angina.   Cardiac Medications -Review what the following cardiac  medications are used for, how they affect the body, and side effects that may occur when taking the medications.  Medications include Aspirin, Beta blockers, calcium channel blockers, ACE Inhibitors, angiotensin receptor blockers, diuretics, digoxin, and antihyperlipidemics.   Congestive Heart Failure -Discuss the definition of CHF, how to live with CHF, the signs and symptoms of CHF, and how keep track of weight and sodium intake.   Heart Disease and Intimacy -Discus the effect sexual activity has on the heart, how changes occur during intimacy as we age, and safety during sexual activity.   Smoking Cessation / COPD -Discuss different methods to quit smoking, the health benefits of quitting smoking, and the definition of COPD.   Nutrition I: Fats -Discuss the types of cholesterol, what cholesterol does to the heart, and how cholesterol levels can be controlled.   Nutrition II: Labels -Discuss the different components of  food labels and how to read food label   Heart Parts/Heart Disease and PAD -Discuss the anatomy of the heart, the pathway of blood circulation through the heart, and these are affected by heart disease.   Stress I: Signs and Symptoms -Discuss the causes of stress, how stress may lead to anxiety and depression, and ways to limit stress.   Stress II: Relaxation -Discuss different types of relaxation techniques to limit stress.   Warning Signs of Stroke / TIA -Discuss definition of a stroke, what the signs and symptoms are of a stroke, and how to identify when someone is having stroke.   Knowledge Questionnaire Score:   Core Components/Risk Factors/Patient Goals at Admission:   Core Components/Risk Factors/Patient Goals Review:   Goals and Risk Factor Review    Row Name 10/16/20 1703 10/16/20 1704 11/13/20 1550 12/07/20 1439       Core Components/Risk Factors/Patient Goals Review   Personal Goals Review Weight Management/Obesity Weight  Management/Obesity;Heart Failure;Stress;Hypertension;Lipids Weight Management/Obesity;Heart Failure;Stress;Hypertension;Lipids Weight Management/Obesity;Heart Failure;Stress;Hypertension;Lipids    Review -- Roselynne has been doing well with exercise at cardiac rehab. Patient's heart rate continues to be in th low 100's when she starts exercise which comes down after rest Azariah has been doing well with exercise at cardiac rehab. Vital signs have been stable. Halcyon continues to have to rest so that her heart rate will come down below 100 to exercise. Keeva had been doing well with exercise at cardiac rehab. Vital signs have been stable. Lakesa continues to have to rest so that her heart rate will come down below 100 to exercise. Phylis last exercise session was on 11/21/20. Kadi is currenlty admitted with a CHF exacerbation. Exercise is on hold.    Expected Outcomes -- Malaijah will continue to partcipate in phase 2 cardiac rehab for exercise, nutrition and lifestyle modifications [name] will continue to partcipate in phase 2 cardiac rehab for exercise, nutrition and lifestyle modifications Neya will continue to partcipate in phase 2 cardiac rehab for exercise, nutrition and lifestyle modifications. When appropriate to return.           Core Components/Risk Factors/Patient Goals at Discharge (Final Review):   Goals and Risk Factor Review - 12/07/20 1439      Core Components/Risk Factors/Patient Goals Review   Personal Goals Review Weight Management/Obesity;Heart Failure;Stress;Hypertension;Lipids    Review Lorae had been doing well with exercise at cardiac rehab. Vital signs have been stable. Avrianna continues to have to rest so that her heart rate will come down below 100 to exercise. Phylis last exercise session was on 11/21/20. Ember is currenlty admitted with a CHF exacerbation. Exercise is on hold.    Expected Outcomes Chella will continue to partcipate in phase 2 cardiac rehab for  exercise, nutrition and lifestyle modifications. When appropriate to return.           ITP Comments:  ITP Comments    Row Name 10/16/20 1657 11/13/20 1549 12/07/20 1446       ITP Comments 30 Day ITP Review. Chastelyn has good attendance and participation in phase 2 cardiac rehab 30 Day ITP Review. Santa has good attendance and participation in phase 2 cardiac rehab 30 Day ITP Review. Phyliis is currently admitted to the hospital exercise is on hold.            Comments: See ITP comments. Patient admitted to the hospital on 12/07/20. Altagracia says she would like to complete her last two weeks of her cardiac rehab when she is able. I  visited the patient on [place] and told her she will need clearance and a follow up post hospitalization from Dr Jacolyn Reedy office. Patient aggreeable to the plan.Barnet Pall, RN,BSN 12/11/2020 11:30 AM

## 2020-12-08 DIAGNOSIS — J9601 Acute respiratory failure with hypoxia: Secondary | ICD-10-CM | POA: Diagnosis not present

## 2020-12-08 DIAGNOSIS — I5021 Acute systolic (congestive) heart failure: Secondary | ICD-10-CM | POA: Diagnosis not present

## 2020-12-08 LAB — BASIC METABOLIC PANEL
Anion gap: 8 (ref 5–15)
BUN: 27 mg/dL — ABNORMAL HIGH (ref 8–23)
CO2: 31 mmol/L (ref 22–32)
Calcium: 9.1 mg/dL (ref 8.9–10.3)
Chloride: 93 mmol/L — ABNORMAL LOW (ref 98–111)
Creatinine, Ser: 1 mg/dL (ref 0.44–1.00)
GFR, Estimated: 55 mL/min — ABNORMAL LOW (ref >60)
Glucose, Bld: 98 mg/dL (ref 70–99)
Potassium: 4 mmol/L (ref 3.5–5.1)
Sodium: 132 mmol/L — ABNORMAL LOW (ref 135–145)

## 2020-12-08 MED ORDER — BISOPROLOL FUMARATE 5 MG PO TABS
2.5000 mg | ORAL_TABLET | Freq: Every day | ORAL | Status: DC
  Administered 2020-12-08 – 2020-12-11 (×4): 2.5 mg via ORAL
  Filled 2020-12-08 (×4): qty 1

## 2020-12-08 MED ORDER — FUROSEMIDE 40 MG PO TABS
40.0000 mg | ORAL_TABLET | Freq: Every day | ORAL | Status: DC
  Administered 2020-12-08 – 2020-12-11 (×4): 40 mg via ORAL
  Filled 2020-12-08 (×5): qty 1

## 2020-12-08 NOTE — Plan of Care (Signed)

## 2020-12-08 NOTE — Progress Notes (Signed)
Progress Note  Patient Name: Patricia Garcia Date of Encounter: 12/08/2020  [facility] Cardiologist: Freada Bergeron, MD   Subjective   SOB is improving  Inpatient Medications    Scheduled Meds: . aspirin  81 mg Oral Daily  . clopidogrel  75 mg Oral Daily  . docusate sodium  100 mg Oral QODAY  . enoxaparin (LOVENOX) injection  40 mg Subcutaneous Q24H  . latanoprost  1 drop Both Eyes QHS  . loratadine  10 mg Oral Daily  . sodium chloride flush  3 mL Intravenous Q12H   Continuous Infusions: . sodium chloride     PRN Meds: sodium chloride, acetaminophen, alum & mag hydroxide-simeth, sodium chloride flush   Vital Signs    Vitals:   12/07/20 0742 12/07/20 2025 12/08/20 0440 12/08/20 0444  BP: 111/73 102/62 114/68   Pulse: 100 (!) 107 (!) 107   Resp:  16 16   Temp:  98.2 F (36.8 C) 98.1 F (36.7 C)   TempSrc:  Oral Oral   SpO2: 98% 92% 96%   Weight:    54.2 kg  Height:        Intake/Output Summary (Last 24 hours) at 12/08/2020 0727 Last data filed at 12/08/2020 [id number] Gross per 24 hour  Intake 780 ml  Output 2650 ml  Net -1870 ml   Last 3 Weights 12/08/2020 12/07/2020 12/06/2020  Weight (lbs) 119 lb 7.8 oz 121 lb 6.4 oz 123 lb 0.3 oz  Weight (kg) 54.2 kg 55.067 kg 55.8 kg      Telemetry    Sinus tach- Personally Reviewed  ECG    n/a - Personally Reviewed  Physical Exam   GEN: No acute distress.   Neck: No JVD Cardiac: RRR, no murmurs, rubs, or gallops.  Respiratory: mild crackles bilateral bases GI: Soft, nontender, non-distended  MS: No edema; No deformity. Neuro:  Nonfocal  Psych: Normal affect   Labs    High Sensitivity Troponin:   Recent Labs  Lab 12/06/20 0521 12/06/20 0630  TROPONINIHS 15 16      Chemistry Recent Labs  Lab 12/06/20 0257 12/06/20 1547 12/07/20 0504 12/08/20 0238  NA 125* 128* 127* 132*  K 3.5  --  3.8 4.0  CL 94*  --  94* 93*  CO2 19*  --  25 31  GLUCOSE 175*  --  103* 98  BUN 15  --  21 27*   CREATININE 0.86  --  1.05* 1.00  CALCIUM 9.2  --  8.8* 9.1  PROT 6.6  --   --   --   ALBUMIN 3.4*  --   --   --   AST 45*  --   --   --   ALT 44  --   --   --   ALKPHOS 62  --   --   --   BILITOT 0.7  --   --   --   GFRNONAA >60  --  52* 55*  ANIONGAP 12  --  8 8     Hematology Recent Labs  Lab 12/06/20 0257  WBC 8.3  RBC 3.56*  HGB 10.0*  HCT 30.0*  MCV 84.3  MCH 28.1  MCHC 33.3  RDW 13.4  PLT 386    BNP Recent Labs  Lab 12/06/20 0520  BNP 1,701.2*     DDimer No results for input(s): DDIMER in the last 168 hours.   Radiology    ECHOCARDIOGRAM COMPLETE  Result Date: 12/07/2020  ECHOCARDIOGRAM REPORT   Patient Name:   Patricia Garcia Date of Exam: 12/07/2020 Medical Rec #:  [id number]        Height:       61.0 in Accession #:    [id number]       Weight:       121.4 lb Date of Birth:  1932-11-18        BSA:          1.528 m Patient Age:    87 years         BP:           111/73 mmHg Patient Gender: F                HR:           101 bpm. Exam Location:  Inpatient Procedure: 2D Echo, 3D Echo, Cardiac Doppler, Color Doppler and Strain Analysis Indications:    I50.40* Unspecified combined systolic (congestive) and diastolic                 (congestive) heart failure  History:        Patient has prior history of Echocardiogram examinations, most                 recent 05/07/2020. Cardiomyopathy, Previous Myocardial                 Infarction, Abnormal ECG, Signs/Symptoms:Dyspnea and Shortness                 of Breath; Risk Factors:Hypertension and Dyslipidemia. Hypoxia.  Sonographer:    Roseanna Rainbow RDCS Referring Phys: [id number] RONDELL A SMITH  Sonographer Comments: Global longitudinal strain was attempted. IMPRESSIONS  1. Left ventricular ejection fraction, by estimation, is 25 to 30%. The left ventricle has severely decreased function. The left ventricle demonstrates regional wall motion abnormalities.         There is severe hypokinesis of the basal-to-mid inferior, basal  inferolateral, and all septal segments. Septal motion is paradoxical due to LBBB. There is mild asymmetric left ventricular hypertrophy of the basal-septal segment. Indeterminate diastolic filling due to E-A fusion.  2. Right ventricular systolic function is mildly reduced. The right ventricular size is mildly enlarged. There is normal pulmonary artery systolic pressure.  3. The mitral valve is normal in structure. Mild to moderate mitral valve regurgitation. Moderate mitral annular calcification.  4. The aortic valve is tricuspid. There is mild calcification of the aortic valve. There is mild thickening of the aortic valve. Aortic valve regurgitation is trivial. Mild aortic valve sclerosis is present, with no evidence of aortic valve stenosis.  5. The inferior vena cava is dilated in size with >50% respiratory variability, suggesting right atrial pressure of 8 mmHg. Comparison(s): Compared to prior TTE in 04/2020, the LVEF is now severely reduced (previously 50%) with elevated filling pressures. FINDINGS  Left Ventricle: Left ventricular ejection fraction, by estimation, is 25 to 30%. Left ventricular ejection fraction by 3D volume is 29 %. The left ventricle has severely decreased function. The left ventricle demonstrates regional wall motion abnormalities. There is severe hypokinesis of the basal-to-mid inferior, basal inferolateral, and all septal segments. The average left ventricular global longitudinal strain is -8.4 %. The global longitudinal strain is abnormal. The left ventricular internal cavity size was normal in size. There is mild asymmetric left ventricular hypertrophy of the basal-septal segment. Abnormal (paradoxical) septal motion, consistent with left bundle Tiyanna Larcom block. Indeterminate diastolic filling due to E-A fusion. Right Ventricle: The  right ventricular size is mildly enlarged. No increase in right ventricular wall thickness. Right ventricular systolic function is mildly reduced. There is  normal pulmonary artery systolic pressure. The tricuspid regurgitant velocity  is 2.60 m/s, and with an assumed right atrial pressure of 8 mmHg, the estimated right ventricular systolic pressure is 57.3 mmHg. Left Atrium: Left atrial size was normal in size. Right Atrium: Right atrial size was normal in size. Pericardium: There is no evidence of pericardial effusion. Mitral Valve: The mitral valve is normal in structure. There is mild thickening of the mitral valve leaflet(s). There is mild calcification of the mitral valve leaflet(s). Moderate mitral annular calcification. Mild to moderate mitral valve regurgitation. MV peak gradient, 10.1 mmHg. The mean mitral valve gradient is 3.0 mmHg. Tricuspid Valve: The tricuspid valve is normal in structure. Tricuspid valve regurgitation is mild. Aortic Valve: The aortic valve is tricuspid. There is mild calcification of the aortic valve. There is mild thickening of the aortic valve. Aortic valve regurgitation is trivial. Mild aortic valve sclerosis is present, with no evidence of aortic valve stenosis. Pulmonic Valve: The pulmonic valve was normal in structure. Pulmonic valve regurgitation is trivial. Aorta: The aortic root and ascending aorta are structurally normal, with no evidence of dilitation. Venous: The inferior vena cava is dilated in size with greater than 50% respiratory variability, suggesting right atrial pressure of 8 mmHg. IAS/Shunts: There is right bowing of the interatrial septum, suggestive of elevated left atrial pressure. No atrial level shunt detected by color flow Doppler. Additional Comments: There is a small pleural effusion in the left lateral region.  LEFT VENTRICLE PLAX 2D LVIDd:         4.85 cm         Diastology LVIDs:         4.70 cm         LV e' medial:    9.14 cm/s LV PW:         1.20 cm         LV E/e' medial:  13.6 LV IVS:        1.40 cm         LV e' lateral:   10.10 cm/s LVOT diam:     1.60 cm         LV E/e' lateral: 12.3 LV SV:          31 LV SV Index:   20              2D LVOT Area:     2.01 cm        Longitudinal                                Strain                                2D Strain GLS  -7.2 % LV Volumes (MOD)               (A2C): LV vol d, MOD    95.3 ml       2D Strain GLS  -7.7 % A2C:                           (A3C): LV vol d, MOD    91.0 ml       2D Strain GLS  -  10.4 % A4C:                           (A4C): LV vol s, MOD    68.0 ml       2D Strain GLS  -8.4 % A2C:                           Avg: LV vol s, MOD    67.9 ml A4C:                           3D Volume EF LV SV MOD A2C:   27.3 ml       LV 3D EF:    Left LV SV MOD A4C:   91.0 ml                    ventricular LV SV MOD BP:    28.3 ml                    ejection                                             fraction by                                             3D volume                                             is 29 %.                                 3D Volume EF:                                3D EF:        29 % RIGHT VENTRICLE            IVC RV S prime:     9.46 cm/s  IVC diam: 1.80 cm TAPSE (M-mode): 1.4 cm LEFT ATRIUM             Index       RIGHT ATRIUM           Index LA diam:        3.40 cm 2.23 cm/m  RA Area:     13.60 cm LA Vol (A2C):   36.2 ml 23.70 ml/m RA Volume:   39.00 ml  25.53 ml/m LA Vol (A4C):   33.9 ml 22.19 ml/m LA Biplane Vol: 36.0 ml 23.57 ml/m  AORTIC VALVE             PULMONIC VALVE LVOT Vmax:   89.50 cm/s  PR End Diast Vel: 1.60 msec LVOT Vmean:  65.300 cm/s LVOT VTI:    0.155 m  AORTA Ao Root diam: 3.00 cm Ao Asc diam:  3.50 cm MITRAL VALVE  TRICUSPID VALVE MV Area (PHT): 5.50 cm     TR Peak grad:   27.0 mmHg MV Area VTI:   1.57 cm     TR Vmax:        260.00 cm/s MV Peak grad:  10.1 mmHg MV Mean grad:  3.0 mmHg     SHUNTS MV Vmax:       1.59 m/s     Systemic VTI:  0.16 m MV Vmean:      72.1 cm/s    Systemic Diam: 1.60 cm MV Decel Time: 138 msec MR Peak grad: 66.9 mmHg MR Mean grad: 34.0 mmHg MR Vmax:      409.00 cm/s MR Vmean:      256.0 cm/s MV E velocity: 124.00 cm/s Gwyndolyn Kaufman MD Electronically signed by Gwyndolyn Kaufman MD Signature Date/Time: 12/07/2020/2:09:52 PM    Final     Cardiac Studies   Echo 04/2020: 1. Hypokinesis of the basal inferior and inferolateral wall with overall  preserved LV function.  2. Left ventricular ejection fraction, by estimation, is 50 to 55%. The  left ventricle has low normal function. The left ventricle demonstrates  regional wall motion abnormalities (see scoring diagram/findings for  description). Left ventricular diastolic  parameters are consistent with Grade II diastolic dysfunction  (pseudonormalization). Elevated left atrial pressure.  3. Right ventricular systolic function is normal. The right ventricular  size is normal. There is mildly elevated pulmonary artery systolic  pressure.  4. Moderate pleural effusion in the left lateral region.  5. The mitral valve is normal in structure. Mild mitral valve  regurgitation. No evidence of mitral stenosis.  6. The aortic valve is tricuspid. Aortic valve regurgitation is trivial.  No aortic stenosis is present.  7. The inferior vena cava is normal in size with greater than 50%  respiratory variability, suggesting right atrial pressure of 3 mmHg.   LHC 04/2020:   1st Diag lesion is 85% stenosed.  Ramus lesion is 30% stenosed.  Prox Cx to Mid Cx lesion is 90% stenosed.  Prox RCA to Mid RCA lesion is 95% stenosed.  Post intervention, there is a 0% residual stenosis.  A drug-eluting stent was successfully placed using a STENT RESOLUTE ONYX 3.0X15.  Post intervention, there is a 0% residual stenosis.  A drug-eluting stent was successfully placed using a [place] 2.25X15.  LV end diastolic pressure is mildly elevated.  1. 3 vessel obstructive CAD - 95% mid RCA- this appeared to be the culprit lesion - 85% proxima first diagonal - 90% very small LCx in the AV groove 2. Mildly  elevated LVEDP 3. Successful PCI of the mid RCA with DES x 1 4. Successful PCI of the first diagonal with DES x 1  Plan: DAPT for one year. Will transition lasix to po. Further plans per primary team.  Patient Profile     87 y.o. female with CAD s/p PCI, hypertension, LBBB, and chronic diastolic heart failure admitted with acute on chronic heart failure  Assessment & Plan    1. Acute systolic HF - 123456 echo LVEF 50-55% - 11/2020 echo: LVEF 25-30%, mild RV dysfunction - neg 2L this admission, received IV lasix 40mg  x 1 yesterday. Low Na and mild Cr elevation are normalizing.   - from notes she previously stopped metoprol, losartan, zetia due to feeling poorly and has not been willing to resume. From chart review all nonspecific reported adverse reactions from these medications, no specific allergies - we discussed the new importance of these  medications today givne her decreased heart function, she is willing to retry. Start at low doses one at a time, very slow titration. Since reported symptoms on metoprolol will try bisoprolol 2.5mg  daily - start oral lasix 40mg  daily.   2. History of CAD - prior stents as reported above, remains on DAPT.  - with prior history and drop in LVEF plan for repeat cath this admission - statin allergy. She stopped zetia on her own in the past as did not feel well on it.   For questions or updates, please contact [facility] Please consult [email or web address] for contact info under        Signed, Carlyle Dolly, MD  12/08/2020, 7:27 AM

## 2020-12-08 NOTE — Progress Notes (Signed)
PROGRESS NOTE    BAILIE CHRISTENBURY  NUU:[id number] DOB: 09/07/1932 DOA: 12/06/2020 PCP: Lajean Manes, MD     Brief Narrative:  DEIRDRA HEUMANN is a 87 y.o. female with medical history significant of hypertension, CAD s/p DES x2 34/7425, diastolic CHF, depression, and anemia presents with complaints of nausea and vomiting.  She has been dealing with sinus congestion and drainage over the last 2 weeks.  Noted associated symptoms of sinus pressure, shortness of breath and productive cough.  She was seen at urgent care on 5/14, and was diagnosed with a acute sinusitis with bronchitis prescribe doxycycline 1 tablet twice daily.  However, after getting home patient reported that every time that she took the doxycycline may cause her stomach to be upset her to have nausea and vomiting.  She continue to try and take the medication for the next 4 days.  However her symptoms of nausea and vomiting were not getting better.  Patient reported inability to lay flat at night due to feeling short of breath and was using 2 pillows to prop her self up.  They had attempted to switch her to cephalexin yesterday, but because of her progressive worsening shortness of breath came to the hospital for further evaluation.  Denies having any significant fever, abdominal pain, diarrhea, or leg swelling.  Normally she does not require oxygen at baseline.  Chest x-ray was significant for small bilateral pleural effusions increased since October with diffuse interstitial opacity suspicious for edema and stable 8 mm right renal calculus.  Patient was given full dose aspirin, Zofran, and furosemide 40 mg IV. Cardiology consulted for acute on chronic diastolic HF.   New events last 24 hours / Subjective: Not having a good day this morning, complaints of phlebotomist, being woken up for daily weights, breakfast etc. Physical complaints including being tired.   Assessment & Plan:   Principal Problem:   Respiratory failure with  hypoxia (HCC) Active Problems:   GERD   CAD (coronary artery disease)   Nausea and vomiting   Prolonged QT interval   Hyponatremia   Nephrolithiasis   Acute combined systolic and diastolic heart failure (HCC)   Acute on chronic systolic and diastolic heart failure -BNP 1701.2 -Strict I's and O's, daily weight, fluid restriction diet -Repeat echocardiogram showed EF 25 to 30%, with regional wall motion abnormality, severely reduced EF compared to previous in 2021 -Cardiology following -Continue PO lasix, bisoprolol  -Planning for heart cath Monday   Hyponatremia -Hypervolemic -Improving, continue to monitor while on Lasix  CKD stage 3a -Stable   CAD -Continue aspirin, Plavix  Nausea and vomiting -Resolved   DVT prophylaxis:  enoxaparin (LOVENOX) injection 40 mg Start: 12/06/20 1000  Code Status:     Code Status Orders  (From admission, onward)         Start     Ordered   12/06/20 0856  Full code  Continuous        12/06/20 0857        Code Status History    Date Active Date Inactive Code Status Order ID Comments User Context   05/06/2020 1749 05/10/2020 2236 Full Code [id number]  Harold Hedge, MD ED   05/01/2020 0018 05/04/2020 2129 Full Code [id number]  Elwyn Reach, MD ED   Advance Care Planning Activity     Family Communication: Son at bedside Disposition Plan:  Status is: Inpatient  Remains inpatient appropriate because:Ongoing diagnostic testing needed not appropriate for outpatient work up and Inpatient level of  care appropriate due to severity of illness   Dispo: The patient is from: Home              Anticipated d/c is to: Home              Patient currently is not medically stable to d/c.  Planning for heart cath on Monday   Difficult to place patient No      Consultants:   Cardiology     Antimicrobials:  Anti-infectives (From admission, onward)   None       Objective: Vitals:   12/08/20 0444 12/08/20 0941 12/08/20  1015 12/08/20 1155  BP:  137/67 114/68 111/73  Pulse:  (!) 112 95 95  Resp:  18 18 17   Temp:  98.1 F (36.7 C) 97.8 F (36.6 C) 97.8 F (36.6 C)  TempSrc:  Oral Oral Oral  SpO2:  98% 99% 99%  Weight: 54.2 kg     Height:        Intake/Output Summary (Last 24 hours) at 12/08/2020 1212 Last data filed at 12/08/2020 1146 Gross per 24 hour  Intake 660 ml  Output 2400 ml  Net -1740 ml   Filed Weights   12/06/20 1026 12/07/20 0538 12/08/20 0444  Weight: 55.8 kg 55.1 kg 54.2 kg    Examination: General exam: Appears calm and comfortable  Respiratory system: Bibasilar crackles.  On room air.  Respiratory effort normal. Cardiovascular system: S1 & S2 heard, sinus tachycardia, rate 110. No pedal edema. Gastrointestinal system: Abdomen is nondistended, soft and nontender. Normal bowel sounds heard. Central nervous system: Alert and oriented. Non focal exam. Speech clear  Extremities: Symmetric in appearance bilaterally  Skin: No rashes, lesions or ulcers on exposed skin  Psychiatry: Judgement and insight appear stable. Mood & affect appropriate.    Data Reviewed: I have personally reviewed following labs and imaging studies  CBC: Recent Labs  Lab 12/06/20 0257  WBC 8.3  HGB 10.0*  HCT 30.0*  MCV 84.3  PLT 237   Basic Metabolic Panel: Recent Labs  Lab 12/06/20 0257 12/06/20 0630 12/06/20 1547 12/07/20 0504 12/08/20 0238  NA 125*  --  128* 127* 132*  K 3.5  --   --  3.8 4.0  CL 94*  --   --  94* 93*  CO2 19*  --   --  25 31  GLUCOSE 175*  --   --  103* 98  BUN 15  --   --  21 27*  CREATININE 0.86  --   --  1.05* 1.00  CALCIUM 9.2  --   --  8.8* 9.1  MG  --  1.7  --  1.8  --    GFR: Estimated Creatinine Clearance: 31 mL/min (by C-G formula based on SCr of 1 mg/dL). Liver Function Tests: Recent Labs  Lab 12/06/20 0257  AST 45*  ALT 44  ALKPHOS 62  BILITOT 0.7  PROT 6.6  ALBUMIN 3.4*   Recent Labs  Lab 12/06/20 0257  LIPASE 34   No results for  input(s): AMMONIA in the last 168 hours. Coagulation Profile: No results for input(s): INR, PROTIME in the last 168 hours. Cardiac Enzymes: No results for input(s): CKTOTAL, CKMB, CKMBINDEX, TROPONINI in the last 168 hours. BNP (last 3 results) No results for input(s): PROBNP in the last 8760 hours. HbA1C: No results for input(s): HGBA1C in the last 72 hours. CBG: No results for input(s): GLUCAP in the last 168 hours. Lipid Profile: No results for input(s):  CHOL, HDL, LDLCALC, TRIG, CHOLHDL, LDLDIRECT in the last 72 hours. Thyroid Function Tests: No results for input(s): TSH, T4TOTAL, FREET4, T3FREE, THYROIDAB in the last 72 hours. Anemia Panel: No results for input(s): VITAMINB12, FOLATE, FERRITIN, TIBC, IRON, RETICCTPCT in the last 72 hours. Sepsis Labs: No results for input(s): PROCALCITON, LATICACIDVEN in the last 168 hours.  Recent Results (from the past 240 hour(s))  Novel Coronavirus, NAA ([facility])     Status: None   Collection Time: 12/01/20  8:55 AM   Specimen: Nasopharyngeal Swab; Nasopharyngeal(NP) swabs in vial transport medium   Nasopharynge  Result Value Ref Range Status   SARS-CoV-2, NAA Not Detected Not Detected Final    Comment: This nucleic acid amplification test was developed and its performance characteristics determined by [employer]. Nucleic acid amplification tests include RT-PCR and TMA. This test has not been FDA cleared or approved. This test has been authorized by FDA under an Emergency Use Authorization (EUA). This test is only authorized for the duration of time the declaration that circumstances exist justifying the authorization of the emergency use of in vitro diagnostic tests for detection of SARS-CoV-2 virus and/or diagnosis of COVID-19 infection under section 564(b)(1) of the Act, 21 U.S.C. [id number]) (1), unless the authorization is terminated or revoked sooner. When diagnostic testing is negative, the possibility of a  false negative result should be considered in the context of a patient's recent exposures and the presence of clinical signs and symptoms consistent with COVID-19. An individual without symptoms of COVID-19 and who is not shedding SARS-CoV-2 virus wo uld expect to have a negative (not detected) result in this assay.   SARS-COV-2, NAA 2 DAY TAT     Status: None   Collection Time: 12/01/20  8:55 AM   Nasopharynge  Result Value Ref Range Status   SARS-CoV-2, NAA 2 DAY TAT Performed  Final  Resp Panel by RT-PCR (Flu A&B, Covid) Nasopharyngeal Swab     Status: None   Collection Time: 12/06/20  4:36 AM   Specimen: Nasopharyngeal Swab; Nasopharyngeal(NP) swabs in vial transport medium  Result Value Ref Range Status   SARS Coronavirus 2 by RT PCR NEGATIVE NEGATIVE Final    Comment: (NOTE) SARS-CoV-2 target nucleic acids are NOT DETECTED.  The SARS-CoV-2 RNA is generally detectable in upper respiratory specimens during the acute phase of infection. The lowest concentration of SARS-CoV-2 viral copies this assay can detect is 138 copies/mL. A negative result does not preclude SARS-Cov-2 infection and should not be used as the sole basis for treatment or other patient management decisions. A negative result may occur with  improper specimen collection/handling, submission of specimen other than nasopharyngeal swab, presence of viral mutation(s) within the areas targeted by this assay, and inadequate number of viral copies(<138 copies/mL). A negative result must be combined with clinical observations, patient history, and epidemiological information. The expected result is Negative.  Fact Sheet for Patients:  EntrepreneurPulse.com.au  Fact Sheet for Healthcare Providers:  IncredibleEmployment.be  This test is no t yet approved or cleared by the [place] FDA and  has been authorized for detection and/or diagnosis of SARS-CoV-2 by FDA under an  Emergency Use Authorization (EUA). This EUA will remain  in effect (meaning this test can be used) for the duration of the COVID-19 declaration under Section 564(b)(1) of the Act, 21 U.S.C.section 360bbb-3(b)(1), unless the authorization is terminated  or revoked sooner.       Influenza A by PCR NEGATIVE NEGATIVE Final   Influenza B by PCR NEGATIVE  NEGATIVE Final    Comment: (NOTE) The Xpert Xpress SARS-CoV-2/FLU/RSV plus assay is intended as an aid in the diagnosis of influenza from Nasopharyngeal swab specimens and should not be used as a sole basis for treatment. Nasal washings and aspirates are unacceptable for Xpert Xpress SARS-CoV-2/FLU/RSV testing.  Fact Sheet for Patients: EntrepreneurPulse.com.au  Fact Sheet for Healthcare Providers: IncredibleEmployment.be  This test is not yet approved or cleared by the [place] FDA and has been authorized for detection and/or diagnosis of SARS-CoV-2 by FDA under an Emergency Use Authorization (EUA). This EUA will remain in effect (meaning this test can be used) for the duration of the COVID-19 declaration under Section 564(b)(1) of the Act, 21 U.S.C. section 360bbb-3(b)(1), unless the authorization is terminated or revoked.  Performed at [facility], [place] [address], [place], [place] [address]       Radiology Studies: ECHOCARDIOGRAM COMPLETE  Result Date: 12/07/2020    ECHOCARDIOGRAM REPORT   Patient Name:   GERIKA BUNTING Date of Exam: 12/07/2020 Medical Rec #:  [id number]        Height:       61.0 in Accession #:    [id number]       Weight:       121.4 lb Date of Birth:  1932-12-01        BSA:          1.528 m Patient Age:    6 years         BP:           111/73 mmHg Patient Gender: F                HR:           101 bpm. Exam Location:  Inpatient Procedure: 2D Echo, 3D Echo, Cardiac Doppler, Color Doppler and Strain Analysis Indications:    I50.40* Unspecified combined systolic  (congestive) and diastolic                 (congestive) heart failure  History:        Patient has prior history of Echocardiogram examinations, most                 recent 05/07/2020. Cardiomyopathy, Previous Myocardial                 Infarction, Abnormal ECG, Signs/Symptoms:Dyspnea and Shortness                 of Breath; Risk Factors:Hypertension and Dyslipidemia. Hypoxia.  Sonographer:    Roseanna Rainbow RDCS Referring Phys: [id number] RONDELL A SMITH  Sonographer Comments: Global longitudinal strain was attempted. IMPRESSIONS  1. Left ventricular ejection fraction, by estimation, is 25 to 30%. The left ventricle has severely decreased function. The left ventricle demonstrates regional wall motion abnormalities.         There is severe hypokinesis of the basal-to-mid inferior, basal inferolateral, and all septal segments. Septal motion is paradoxical due to LBBB. There is mild asymmetric left ventricular hypertrophy of the basal-septal segment. Indeterminate diastolic filling due to E-A fusion.  2. Right ventricular systolic function is mildly reduced. The right ventricular size is mildly enlarged. There is normal pulmonary artery systolic pressure.  3. The mitral valve is normal in structure. Mild to moderate mitral valve regurgitation. Moderate mitral annular calcification.  4. The aortic valve is tricuspid. There is mild calcification of the aortic valve. There is mild thickening of the aortic valve. Aortic valve regurgitation is trivial. Mild aortic  valve sclerosis is present, with no evidence of aortic valve stenosis.  5. The inferior vena cava is dilated in size with >50% respiratory variability, suggesting right atrial pressure of 8 mmHg. Comparison(s): Compared to prior TTE in 04/2020, the LVEF is now severely reduced (previously 50%) with elevated filling pressures. FINDINGS  Left Ventricle: Left ventricular ejection fraction, by estimation, is 25 to 30%. Left ventricular ejection fraction by 3D volume is 29 %.  The left ventricle has severely decreased function. The left ventricle demonstrates regional wall motion abnormalities. There is severe hypokinesis of the basal-to-mid inferior, basal inferolateral, and all septal segments. The average left ventricular global longitudinal strain is -8.4 %. The global longitudinal strain is abnormal. The left ventricular internal cavity size was normal in size. There is mild asymmetric left ventricular hypertrophy of the basal-septal segment. Abnormal (paradoxical) septal motion, consistent with left bundle branch block. Indeterminate diastolic filling due to E-A fusion. Right Ventricle: The right ventricular size is mildly enlarged. No increase in right ventricular wall thickness. Right ventricular systolic function is mildly reduced. There is normal pulmonary artery systolic pressure. The tricuspid regurgitant velocity  is 2.60 m/s, and with an assumed right atrial pressure of 8 mmHg, the estimated right ventricular systolic pressure is 61.4 mmHg. Left Atrium: Left atrial size was normal in size. Right Atrium: Right atrial size was normal in size. Pericardium: There is no evidence of pericardial effusion. Mitral Valve: The mitral valve is normal in structure. There is mild thickening of the mitral valve leaflet(s). There is mild calcification of the mitral valve leaflet(s). Moderate mitral annular calcification. Mild to moderate mitral valve regurgitation. MV peak gradient, 10.1 mmHg. The mean mitral valve gradient is 3.0 mmHg. Tricuspid Valve: The tricuspid valve is normal in structure. Tricuspid valve regurgitation is mild. Aortic Valve: The aortic valve is tricuspid. There is mild calcification of the aortic valve. There is mild thickening of the aortic valve. Aortic valve regurgitation is trivial. Mild aortic valve sclerosis is present, with no evidence of aortic valve stenosis. Pulmonic Valve: The pulmonic valve was normal in structure. Pulmonic valve regurgitation is trivial.  Aorta: The aortic root and ascending aorta are structurally normal, with no evidence of dilitation. Venous: The inferior vena cava is dilated in size with greater than 50% respiratory variability, suggesting right atrial pressure of 8 mmHg. IAS/Shunts: There is right bowing of the interatrial septum, suggestive of elevated left atrial pressure. No atrial level shunt detected by color flow Doppler. Additional Comments: There is a small pleural effusion in the left lateral region.  LEFT VENTRICLE PLAX 2D LVIDd:         4.85 cm         Diastology LVIDs:         4.70 cm         LV e' medial:    9.14 cm/s LV PW:         1.20 cm         LV E/e' medial:  13.6 LV IVS:        1.40 cm         LV e' lateral:   10.10 cm/s LVOT diam:     1.60 cm         LV E/e' lateral: 12.3 LV SV:         31 LV SV Index:   20              2D LVOT Area:     2.01 cm  Longitudinal                                Strain                                2D Strain GLS  -7.2 % LV Volumes (MOD)               (A2C): LV vol d, MOD    95.3 ml       2D Strain GLS  -7.7 % A2C:                           (A3C): LV vol d, MOD    91.0 ml       2D Strain GLS  -10.4 % A4C:                           (A4C): LV vol s, MOD    68.0 ml       2D Strain GLS  -8.4 % A2C:                           Avg: LV vol s, MOD    67.9 ml A4C:                           3D Volume EF LV SV MOD A2C:   27.3 ml       LV 3D EF:    Left LV SV MOD A4C:   91.0 ml                    ventricular LV SV MOD BP:    28.3 ml                    ejection                                             fraction by                                             3D volume                                             is 29 %.                                 3D Volume EF:                                3D EF:        29 % RIGHT VENTRICLE            IVC RV S prime:     9.46 cm/s  IVC diam: 1.80 cm TAPSE (M-mode): 1.4 cm  LEFT ATRIUM             Index       RIGHT ATRIUM           Index LA diam:        3.40 cm 2.23  cm/m  RA Area:     13.60 cm LA Vol (A2C):   36.2 ml 23.70 ml/m RA Volume:   39.00 ml  25.53 ml/m LA Vol (A4C):   33.9 ml 22.19 ml/m LA Biplane Vol: 36.0 ml 23.57 ml/m  AORTIC VALVE             PULMONIC VALVE LVOT Vmax:   89.50 cm/s  PR End Diast Vel: 1.60 msec LVOT Vmean:  65.300 cm/s LVOT VTI:    0.155 m  AORTA Ao Root diam: 3.00 cm Ao Asc diam:  3.50 cm MITRAL VALVE                TRICUSPID VALVE MV Area (PHT): 5.50 cm     TR Peak grad:   27.0 mmHg MV Area VTI:   1.57 cm     TR Vmax:        260.00 cm/s MV Peak grad:  10.1 mmHg MV Mean grad:  3.0 mmHg     SHUNTS MV Vmax:       1.59 m/s     Systemic VTI:  0.16 m MV Vmean:      72.1 cm/s    Systemic Diam: 1.60 cm MV Decel Time: 138 msec MR Peak grad: 66.9 mmHg MR Mean grad: 34.0 mmHg MR Vmax:      409.00 cm/s MR Vmean:     256.0 cm/s MV E velocity: 124.00 cm/s Gwyndolyn Kaufman MD Electronically signed by Gwyndolyn Kaufman MD Signature Date/Time: 12/07/2020/2:09:52 PM    Final       Scheduled Meds: . aspirin  81 mg Oral Daily  . bisoprolol  2.5 mg Oral Daily  . clopidogrel  75 mg Oral Daily  . docusate sodium  100 mg Oral QODAY  . enoxaparin (LOVENOX) injection  40 mg Subcutaneous Q24H  . furosemide  40 mg Oral Daily  . latanoprost  1 drop Both Eyes QHS  . loratadine  10 mg Oral Daily  . sodium chloride flush  3 mL Intravenous Q12H   Continuous Infusions: . sodium chloride       LOS: 2 days      Time spent: 25 minutes   Dessa Phi, DO [facility] 12/08/2020, 12:12 PM   Available via Epic secure chat 7am-7pm After these hours, please refer to coverage provider listed on [email or web address]

## 2020-12-09 DIAGNOSIS — J9601 Acute respiratory failure with hypoxia: Secondary | ICD-10-CM | POA: Diagnosis not present

## 2020-12-09 DIAGNOSIS — I5021 Acute systolic (congestive) heart failure: Secondary | ICD-10-CM | POA: Diagnosis not present

## 2020-12-09 LAB — BASIC METABOLIC PANEL
Anion gap: 12 (ref 5–15)
BUN: 25 mg/dL — ABNORMAL HIGH (ref 8–23)
CO2: 25 mmol/L (ref 22–32)
Calcium: 9 mg/dL (ref 8.9–10.3)
Chloride: 94 mmol/L — ABNORMAL LOW (ref 98–111)
Creatinine, Ser: 1.08 mg/dL — ABNORMAL HIGH (ref 0.44–1.00)
GFR, Estimated: 50 mL/min — ABNORMAL LOW (ref >60)
Glucose, Bld: 118 mg/dL — ABNORMAL HIGH (ref 70–99)
Potassium: 4.2 mmol/L (ref 3.5–5.1)
Sodium: 131 mmol/L — ABNORMAL LOW (ref 135–145)

## 2020-12-09 MED ORDER — SODIUM CHLORIDE 0.9% FLUSH
3.0000 mL | Freq: Two times a day (BID) | INTRAVENOUS | Status: DC
  Administered 2020-12-09 – 2020-12-10 (×3): 3 mL via INTRAVENOUS

## 2020-12-09 MED ORDER — SODIUM CHLORIDE 0.9 % IV SOLN: 250.0000 mL | INTRAVENOUS | Status: DC | PRN

## 2020-12-09 MED ORDER — SODIUM CHLORIDE 0.9% FLUSH: 3.0000 mL | INTRAVENOUS | Status: DC | PRN

## 2020-12-09 MED ORDER — ALPRAZOLAM 0.25 MG PO TABS
0.2500 mg | ORAL_TABLET | Freq: Two times a day (BID) | ORAL | Status: DC | PRN
  Administered 2020-12-09: 0.25 mg via ORAL
  Administered 2020-12-09 – 2020-12-10 (×2): 0.125 mg via ORAL
  Filled 2020-12-09 (×4): qty 1

## 2020-12-09 MED ORDER — SODIUM CHLORIDE 0.9 % IV SOLN: INTRAVENOUS | Status: DC

## 2020-12-09 MED ORDER — ASPIRIN 81 MG PO CHEW
81.0000 mg | CHEWABLE_TABLET | ORAL | Status: AC
  Administered 2020-12-10: 81 mg via ORAL
  Filled 2020-12-09: qty 1

## 2020-12-09 NOTE — Progress Notes (Signed)
MD, could u order some Colace 100mg , pt takes at home, thanks Buckner [place].

## 2020-12-09 NOTE — Progress Notes (Addendum)
Progress Note  Patient Name: Patricia Garcia Date of Encounter: 12/09/2020  [facility] Cardiologist: Freada Bergeron, MD   Subjective   No SOB, mild cough  Inpatient Medications    Scheduled Meds: . aspirin  81 mg Oral Daily  . bisoprolol  2.5 mg Oral Daily  . clopidogrel  75 mg Oral Daily  . docusate sodium  100 mg Oral QODAY  . enoxaparin (LOVENOX) injection  40 mg Subcutaneous Q24H  . furosemide  40 mg Oral Daily  . latanoprost  1 drop Both Eyes QHS  . loratadine  10 mg Oral Daily  . sodium chloride flush  3 mL Intravenous Q12H   Continuous Infusions: . sodium chloride     PRN Meds: sodium chloride, acetaminophen, ALPRAZolam, alum & mag hydroxide-simeth, sodium chloride flush   Vital Signs    Vitals:   12/08/20 1624 12/08/20 1949 12/09/20 0306 12/09/20 0309  BP: 104/61 109/69 106/75   Pulse: 98 (!) 102 98   Resp: 18 18 16    Temp: 98.2 F (36.8 C)  97.6 F (36.4 C)   TempSrc: Oral Oral Oral   SpO2: 98% 98% 100%   Weight:    53.9 kg  Height:        Intake/Output Summary (Last 24 hours) at 12/09/2020 0831 Last data filed at 12/09/2020 0700 Gross per 24 hour  Intake 1055 ml  Output 1350 ml  Net -295 ml   Last 3 Weights 12/09/2020 12/08/2020 12/07/2020  Weight (lbs) 118 lb 14.4 oz 119 lb 7.8 oz 121 lb 6.4 oz  Weight (kg) 53.933 kg 54.2 kg 55.067 kg      Telemetry    SR LBBB - Personally Reviewed  ECG    n/a - Personally Reviewed  Physical Exam   GEN: No acute distress.   Neck: No JVD Cardiac: RRR, no murmurs, rubs, or gallops.  Respiratory: Clear to auscultation bilaterally. GI: Soft, nontender, non-distended  MS: No edema; No deformity. Neuro:  Nonfocal  Psych: Normal affect   Labs    High Sensitivity Troponin:   Recent Labs  Lab 12/06/20 0521 12/06/20 0630  TROPONINIHS 15 16      Chemistry Recent Labs  Lab 12/06/20 0257 12/06/20 1547 12/07/20 0504 12/08/20 0238 12/09/20 0310  NA 125*   < > 127* 132* 131*  K 3.5  --   3.8 4.0 4.2  CL 94*  --  94* 93* 94*  CO2 19*  --  25 31 25   GLUCOSE 175*  --  103* 98 118*  BUN 15  --  21 27* 25*  CREATININE 0.86  --  1.05* 1.00 1.08*  CALCIUM 9.2  --  8.8* 9.1 9.0  PROT 6.6  --   --   --   --   ALBUMIN 3.4*  --   --   --   --   AST 45*  --   --   --   --   ALT 44  --   --   --   --   ALKPHOS 62  --   --   --   --   BILITOT 0.7  --   --   --   --   GFRNONAA >60  --  52* 55* 50*  ANIONGAP 12  --  8 8 12    < > = values in this interval not displayed.     Hematology Recent Labs  Lab 12/06/20 0257  WBC 8.3  RBC 3.56*  HGB  10.0*  HCT 30.0*  MCV 84.3  MCH 28.1  MCHC 33.3  RDW 13.4  PLT 386    BNP Recent Labs  Lab 12/06/20 0520  BNP 1,701.2*     DDimer No results for input(s): DDIMER in the last 168 hours.   Radiology    ECHOCARDIOGRAM COMPLETE  Result Date: 12/07/2020    ECHOCARDIOGRAM REPORT   Patient Name:   Patricia Garcia Date of Exam: 12/07/2020 Medical Rec #:  [id number]        Height:       61.0 in Accession #:    [id number]       Weight:       121.4 lb Date of Birth:  [birth year]        BSA:          1.528 m Patient Age:    87 years         BP:           111/73 mmHg Patient Gender: F                HR:           101 bpm. Exam Location:  Inpatient Procedure: 2D Echo, 3D Echo, Cardiac Doppler, Color Doppler and Strain Analysis Indications:    I50.40* Unspecified combined systolic (congestive) and diastolic                 (congestive) heart failure  History:        Patient has prior history of Echocardiogram examinations, most                 recent 05/07/2020. Cardiomyopathy, Previous Myocardial                 Infarction, Abnormal ECG, Signs/Symptoms:Dyspnea and Shortness                 of Breath; Risk Factors:Hypertension and Dyslipidemia. Hypoxia.  Sonographer:    Roseanna Rainbow RDCS Referring Phys: [id number] RONDELL A SMITH  Sonographer Comments: Global longitudinal strain was attempted. IMPRESSIONS  1. Left ventricular ejection fraction, by  estimation, is 25 to 30%. The left ventricle has severely decreased function. The left ventricle demonstrates regional wall motion abnormalities.         There is severe hypokinesis of the basal-to-mid inferior, basal inferolateral, and all septal segments. Septal motion is paradoxical due to LBBB. There is mild asymmetric left ventricular hypertrophy of the basal-septal segment. Indeterminate diastolic filling due to E-A fusion.  2. Right ventricular systolic function is mildly reduced. The right ventricular size is mildly enlarged. There is normal pulmonary artery systolic pressure.  3. The mitral valve is normal in structure. Mild to moderate mitral valve regurgitation. Moderate mitral annular calcification.  4. The aortic valve is tricuspid. There is mild calcification of the aortic valve. There is mild thickening of the aortic valve. Aortic valve regurgitation is trivial. Mild aortic valve sclerosis is present, with no evidence of aortic valve stenosis.  5. The inferior vena cava is dilated in size with >50% respiratory variability, suggesting right atrial pressure of 8 mmHg. Comparison(s): Compared to prior TTE in 04/2020, the LVEF is now severely reduced (previously 50%) with elevated filling pressures. FINDINGS  Left Ventricle: Left ventricular ejection fraction, by estimation, is 25 to 30%. Left ventricular ejection fraction by 3D volume is 29 %. The left ventricle has severely decreased function. The left ventricle demonstrates regional wall motion abnormalities. There is severe hypokinesis of the basal-to-mid inferior, basal  inferolateral, and all septal segments. The average left ventricular global longitudinal strain is -8.4 %. The global longitudinal strain is abnormal. The left ventricular internal cavity size was normal in size. There is mild asymmetric left ventricular hypertrophy of the basal-septal segment. Abnormal (paradoxical) septal motion, consistent with left bundle Elina Streng block.  Indeterminate diastolic filling due to E-A fusion. Right Ventricle: The right ventricular size is mildly enlarged. No increase in right ventricular wall thickness. Right ventricular systolic function is mildly reduced. There is normal pulmonary artery systolic pressure. The tricuspid regurgitant velocity  is 2.60 m/s, and with an assumed right atrial pressure of 8 mmHg, the estimated right ventricular systolic pressure is 08.6 mmHg. Left Atrium: Left atrial size was normal in size. Right Atrium: Right atrial size was normal in size. Pericardium: There is no evidence of pericardial effusion. Mitral Valve: The mitral valve is normal in structure. There is mild thickening of the mitral valve leaflet(s). There is mild calcification of the mitral valve leaflet(s). Moderate mitral annular calcification. Mild to moderate mitral valve regurgitation. MV peak gradient, 10.1 mmHg. The mean mitral valve gradient is 3.0 mmHg. Tricuspid Valve: The tricuspid valve is normal in structure. Tricuspid valve regurgitation is mild. Aortic Valve: The aortic valve is tricuspid. There is mild calcification of the aortic valve. There is mild thickening of the aortic valve. Aortic valve regurgitation is trivial. Mild aortic valve sclerosis is present, with no evidence of aortic valve stenosis. Pulmonic Valve: The pulmonic valve was normal in structure. Pulmonic valve regurgitation is trivial. Aorta: The aortic root and ascending aorta are structurally normal, with no evidence of dilitation. Venous: The inferior vena cava is dilated in size with greater than 50% respiratory variability, suggesting right atrial pressure of 8 mmHg. IAS/Shunts: There is right bowing of the interatrial septum, suggestive of elevated left atrial pressure. No atrial level shunt detected by color flow Doppler. Additional Comments: There is a small pleural effusion in the left lateral region.  LEFT VENTRICLE PLAX 2D LVIDd:         4.85 cm         Diastology LVIDs:          4.70 cm         LV e' medial:    9.14 cm/s LV PW:         1.20 cm         LV E/e' medial:  13.6 LV IVS:        1.40 cm         LV e' lateral:   10.10 cm/s LVOT diam:     1.60 cm         LV E/e' lateral: 12.3 LV SV:         31 LV SV Index:   20              2D LVOT Area:     2.01 cm        Longitudinal                                Strain                                2D Strain GLS  -7.2 % LV Volumes (MOD)               (A2C): LV vol d, MOD  95.3 ml       2D Strain GLS  -7.7 % A2C:                           (A3C): LV vol d, MOD    91.0 ml       2D Strain GLS  -10.4 % A4C:                           (A4C): LV vol s, MOD    68.0 ml       2D Strain GLS  -8.4 % A2C:                           Avg: LV vol s, MOD    67.9 ml A4C:                           3D Volume EF LV SV MOD A2C:   27.3 ml       LV 3D EF:    Left LV SV MOD A4C:   91.0 ml                    ventricular LV SV MOD BP:    28.3 ml                    ejection                                             fraction by                                             3D volume                                             is 29 %.                                 3D Volume EF:                                3D EF:        29 % RIGHT VENTRICLE            IVC RV S prime:     9.46 cm/s  IVC diam: 1.80 cm TAPSE (M-mode): 1.4 cm LEFT ATRIUM             Index       RIGHT ATRIUM           Index LA diam:        3.40 cm 2.23 cm/m  RA Area:     13.60 cm LA Vol (A2C):   36.2 ml 23.70 ml/m RA Volume:   39.00 ml  25.53 ml/m LA Vol (A4C):   33.9 ml 22.19 ml/m LA Biplane Vol: 36.0 ml 23.57 ml/m  AORTIC VALVE  PULMONIC VALVE LVOT Vmax:   89.50 cm/s  PR End Diast Vel: 1.60 msec LVOT Vmean:  65.300 cm/s LVOT VTI:    0.155 m  AORTA Ao Root diam: 3.00 cm Ao Asc diam:  3.50 cm MITRAL VALVE                TRICUSPID VALVE MV Area (PHT): 5.50 cm     TR Peak grad:   27.0 mmHg MV Area VTI:   1.57 cm     TR Vmax:        260.00 cm/s MV Peak grad:  10.1 mmHg MV Mean grad:   3.0 mmHg     SHUNTS MV Vmax:       1.59 m/s     Systemic VTI:  0.16 m MV Vmean:      72.1 cm/s    Systemic Diam: 1.60 cm MV Decel Time: 138 msec MR Peak grad: 66.9 mmHg MR Mean grad: 34.0 mmHg MR Vmax:      409.00 cm/s MR Vmean:     256.0 cm/s MV E velocity: 124.00 cm/s Gwyndolyn Kaufman MD Electronically signed by Gwyndolyn Kaufman MD Signature Date/Time: 12/07/2020/2:09:52 PM    Final     Cardiac Studies    Patient Profile     87 y.o.femalewith CAD s/p PCI, hypertension, LBBB, and chronic diastolic heart failure admitted with acute on chronic heart failure  Assessment & Plan    1. Acute systolic HF - 123456 echo LVEF 50-55% - 11/2020 echo: LVEF 25-30%, mild RV dysfunction - neg 2.1L this admission.  Wt 122 admit -->118 - she is on oral lasix 40mg  daily. Mild Cr elevatoin but within prior ranges   - from notes she previously stopped metoprol, losartan, zetia due to "feeling poorly" and had not been willing to resume.  From chart review all nonspecific reported adverse reactions from these medications, no specific allergies. History compouneded by depression. Losartan caused fatigue and crying episodes per notes - we discussed the new importance of these medications today given her decreased heart function, she is willing to retry. Start at low doses one at a time, very slow titration. Since reported symptoms on metoprolol we started bisoprolol 2.5mg  daily - she is on oral lasix 40mg  daily, appears euvolemic. Follow up filling pressure from cath tomorrow   2. History of CAD - prior stents as reported above, remains on DAPT.  - with prior history and drop in LVEF plan for repeat cath this admission - left and right heart cath tomorrow, she had requested Dr Martinique do her cath since he had done prior case however appears he is not on schedule tomorrow.    3. Hyperlipidemia - statin allergy. She stopped zetia on her own in the past as did not feel well on it.  - repeat lipids - could  consider pcsk9 inhibitor as outpatient, very prone to reporting side effects to medications so unclear if would be willing. May not want to cloud potential medication side effect picture while restarting her HF meds.   I have reviewed the risks, indications, and alternatives to cardiac catheterization, possible angioplasty, and stenting with the patient and her son today. Risks include but are not limited to bleeding, infection, vascular injury, stroke, myocardial infection, arrhythmia, kidney injury, radiation-related injury in the case of prolonged fluoroscopy use, emergency cardiac surgery, and death. The patient understands the risks of serious complication is 1-2 in [id number] with diagnostic cardiac cath and 1-2% or less with angioplasty/stenting.    For questions  or updates, please contact [place] Please consult [email or web address] for contact info under        Signed, Carlyle Dolly, MD  12/09/2020, 8:31 AM

## 2020-12-09 NOTE — Progress Notes (Addendum)
PROGRESS NOTE    Patricia Garcia  [id number] DOB: 04/28/1933 DOA: 12/06/2020 PCP: Lajean Manes, MD     Brief Narrative:  Patricia Garcia is a 87 y.o. female with medical history significant of hypertension, CAD s/p DES x2 123456, diastolic CHF, depression, and anemia presents with complaints of nausea and vomiting.  She has been dealing with sinus congestion and drainage over the last 2 weeks.  Noted associated symptoms of sinus pressure, shortness of breath and productive cough.  She was seen at urgent care on 5/14, and was diagnosed with a acute sinusitis with bronchitis prescribe doxycycline 1 tablet twice daily.  However, after getting home patient reported that every time that she took the doxycycline may cause her stomach to be upset her to have nausea and vomiting.  She continue to try and take the medication for the next 4 days.  However her symptoms of nausea and vomiting were not getting better.  Patient reported inability to lay flat at night due to feeling short of breath and was using 2 pillows to prop her self up.  They had attempted to switch her to cephalexin yesterday, but because of her progressive worsening shortness of breath came to the hospital for further evaluation.  Denies having any significant fever, abdominal pain, diarrhea, or leg swelling.  Normally she does not require oxygen at baseline.  Chest x-ray was significant for small bilateral pleural effusions increased since October with diffuse interstitial opacity suspicious for edema and stable 8 mm right renal calculus.  Patient was given full dose aspirin, Zofran, and furosemide 40 mg IV. Cardiology consulted for acute on chronic diastolic HF.   New events last 24 hours / Subjective: Had a difficult time sleeping, finally was able to fall asleep around 5:00 this morning.  States that her breathing feels better, no complaints on examination today.  Urine output 1.1 L last 24 hours.  Assessment & Plan:   Principal  Problem:   Respiratory failure with hypoxia (HCC) Active Problems:   GERD   CAD (coronary artery disease)   Nausea and vomiting   Prolonged QT interval   Hyponatremia   Nephrolithiasis   Acute combined systolic and diastolic heart failure (HCC)   Acute on chronic systolic and diastolic heart failure -BNP 1701.2 -Strict I's and O's, daily weight, fluid restriction diet -Repeat echocardiogram showed EF 25 to 30%, with regional wall motion abnormality, severely reduced EF compared to previous in 2021 -Cardiology following -Continue PO lasix, bisoprolol  -Planning for heart cath Monday   Hyponatremia -Hypervolemic -Improving, continue to monitor while on Lasix  CKD stage 3a -Stable   CAD -Continue aspirin, Plavix  Nausea and vomiting -Resolved   DVT prophylaxis:  enoxaparin (LOVENOX) injection 40 mg Start: 12/06/20 1000  Code Status:     Code Status Orders  (From admission, onward)         Start     Ordered   12/06/20 0856  Full code  Continuous        12/06/20 0857        Code Status History    Date Active Date Inactive Code Status Order ID Comments User Context   05/06/2020 1749 05/10/2020 2236 Full Code [id number]  Harold Hedge, MD ED   05/01/2020 0018 05/04/2020 2129 Full Code [id number]  Elwyn Reach, MD ED   Advance Care Planning Activity     Family Communication: None at bedside Disposition Plan:  Status is: Inpatient  Remains inpatient appropriate because:Ongoing diagnostic testing  needed not appropriate for outpatient work up and Inpatient level of care appropriate due to severity of illness   Dispo: The patient is from: Home              Anticipated d/c is to: Home              Patient currently is not medically stable to d/c.  Planning for heart cath on Monday   Difficult to place patient No      Consultants:   Cardiology     Antimicrobials:  Anti-infectives (From admission, onward)   None       Objective: Vitals:    12/08/20 1624 12/08/20 1949 12/09/20 0306 12/09/20 0309  BP: 104/61 109/69 106/75   Pulse: 98 (!) 102 98   Resp: 18 18 16    Temp: 98.2 F (36.8 C)  97.6 F (36.4 C)   TempSrc: Oral Oral Oral   SpO2: 98% 98% 100%   Weight:    53.9 kg  Height:        Intake/Output Summary (Last 24 hours) at 12/09/2020 1330 Last data filed at 12/09/2020 0700 Gross per 24 hour  Intake 935 ml  Output 1150 ml  Net -215 ml   Filed Weights   12/07/20 0538 12/08/20 0444 12/09/20 0309  Weight: 55.1 kg 54.2 kg 53.9 kg    Examination: General exam: Appears calm and comfortable  Respiratory system: Clear to auscultation. Respiratory effort normal.  On room air Cardiovascular system: S1 & S2 heard, RRR. No pedal edema. Gastrointestinal system: Abdomen is nondistended, soft and nontender. Normal bowel sounds heard. Central nervous system: Alert and oriented. Non focal exam. Speech clear  Extremities: Symmetric in appearance bilaterally  Skin: No rashes, lesions or ulcers on exposed skin  Psychiatry: Judgement and insight appear stable. Mood & affect appropriate.   Data Reviewed: I have personally reviewed following labs and imaging studies  CBC: Recent Labs  Lab 12/06/20 0257  WBC 8.3  HGB 10.0*  HCT 30.0*  MCV 84.3  PLT 161   Basic Metabolic Panel: Recent Labs  Lab 12/06/20 0257 12/06/20 0630 12/06/20 1547 12/07/20 0504 12/08/20 0238 12/09/20 0310  NA 125*  --  128* 127* 132* 131*  K 3.5  --   --  3.8 4.0 4.2  CL 94*  --   --  94* 93* 94*  CO2 19*  --   --  25 31 25   GLUCOSE 175*  --   --  103* 98 118*  BUN 15  --   --  21 27* 25*  CREATININE 0.86  --   --  1.05* 1.00 1.08*  CALCIUM 9.2  --   --  8.8* 9.1 9.0  MG  --  1.7  --  1.8  --   --    GFR: Estimated Creatinine Clearance: 28.7 mL/min (A) (by C-G formula based on SCr of 1.08 mg/dL (H)). Liver Function Tests: Recent Labs  Lab 12/06/20 0257  AST 45*  ALT 44  ALKPHOS 62  BILITOT 0.7  PROT 6.6  ALBUMIN 3.4*   Recent  Labs  Lab 12/06/20 0257  LIPASE 34   No results for input(s): AMMONIA in the last 168 hours. Coagulation Profile: No results for input(s): INR, PROTIME in the last 168 hours. Cardiac Enzymes: No results for input(s): CKTOTAL, CKMB, CKMBINDEX, TROPONINI in the last 168 hours. BNP (last 3 results) No results for input(s): PROBNP in the last 8760 hours. HbA1C: No results for input(s): HGBA1C in the last  72 hours. CBG: No results for input(s): GLUCAP in the last 168 hours. Lipid Profile: No results for input(s): CHOL, HDL, LDLCALC, TRIG, CHOLHDL, LDLDIRECT in the last 72 hours. Thyroid Function Tests: No results for input(s): TSH, T4TOTAL, FREET4, T3FREE, THYROIDAB in the last 72 hours. Anemia Panel: No results for input(s): VITAMINB12, FOLATE, FERRITIN, TIBC, IRON, RETICCTPCT in the last 72 hours. Sepsis Labs: No results for input(s): PROCALCITON, LATICACIDVEN in the last 168 hours.  Recent Results (from the past 240 hour(s))  Novel Coronavirus, NAA ([facility])     Status: None   Collection Time: 12/01/20  8:55 AM   Specimen: Nasopharyngeal Swab; Nasopharyngeal(NP) swabs in vial transport medium   Nasopharynge  Result Value Ref Range Status   SARS-CoV-2, NAA Not Detected Not Detected Final    Comment: This nucleic acid amplification test was developed and its performance characteristics determined by [employer]. Nucleic acid amplification tests include RT-PCR and TMA. This test has not been FDA cleared or approved. This test has been authorized by FDA under an Emergency Use Authorization (EUA). This test is only authorized for the duration of time the declaration that circumstances exist justifying the authorization of the emergency use of in vitro diagnostic tests for detection of SARS-CoV-2 virus and/or diagnosis of COVID-19 infection under section 564(b)(1) of the Act, 21 U.S.C. 417EYC-1(K) (1), unless the authorization is terminated or revoked sooner. When  diagnostic testing is negative, the possibility of a false negative result should be considered in the context of a patient's recent exposures and the presence of clinical signs and symptoms consistent with COVID-19. An individual without symptoms of COVID-19 and who is not shedding SARS-CoV-2 virus wo uld expect to have a negative (not detected) result in this assay.   SARS-COV-2, NAA 2 DAY TAT     Status: None   Collection Time: 12/01/20  8:55 AM   Nasopharynge  Result Value Ref Range Status   SARS-CoV-2, NAA 2 DAY TAT Performed  Final  Resp Panel by RT-PCR (Flu A&B, Covid) Nasopharyngeal Swab     Status: None   Collection Time: 12/06/20  4:36 AM   Specimen: Nasopharyngeal Swab; Nasopharyngeal(NP) swabs in vial transport medium  Result Value Ref Range Status   SARS Coronavirus 2 by RT PCR NEGATIVE NEGATIVE Final    Comment: (NOTE) SARS-CoV-2 target nucleic acids are NOT DETECTED.  The SARS-CoV-2 RNA is generally detectable in upper respiratory specimens during the acute phase of infection. The lowest concentration of SARS-CoV-2 viral copies this assay can detect is 138 copies/mL. A negative result does not preclude SARS-Cov-2 infection and should not be used as the sole basis for treatment or other patient management decisions. A negative result may occur with  improper specimen collection/handling, submission of specimen other than nasopharyngeal swab, presence of viral mutation(s) within the areas targeted by this assay, and inadequate number of viral copies(<138 copies/mL). A negative result must be combined with clinical observations, patient history, and epidemiological information. The expected result is Negative.  Fact Sheet for Patients:  EntrepreneurPulse.com.au  Fact Sheet for Healthcare Providers:  IncredibleEmployment.be  This test is no t yet approved or cleared by the [place] FDA and  has been authorized for detection  and/or diagnosis of SARS-CoV-2 by FDA under an Emergency Use Authorization (EUA). This EUA will remain  in effect (meaning this test can be used) for the duration of the COVID-19 declaration under Section 564(b)(1) of the Act, 21 U.S.C.section 360bbb-3(b)(1), unless the authorization is terminated  or revoked sooner.  Influenza A by PCR NEGATIVE NEGATIVE Final   Influenza B by PCR NEGATIVE NEGATIVE Final    Comment: (NOTE) The Xpert Xpress SARS-CoV-2/FLU/RSV plus assay is intended as an aid in the diagnosis of influenza from Nasopharyngeal swab specimens and should not be used as a sole basis for treatment. Nasal washings and aspirates are unacceptable for Xpert Xpress SARS-CoV-2/FLU/RSV testing.  Fact Sheet for Patients: EntrepreneurPulse.com.au  Fact Sheet for Healthcare Providers: IncredibleEmployment.be  This test is not yet approved or cleared by the [place] FDA and has been authorized for detection and/or diagnosis of SARS-CoV-2 by FDA under an Emergency Use Authorization (EUA). This EUA will remain in effect (meaning this test can be used) for the duration of the COVID-19 declaration under Section 564(b)(1) of the Act, 21 U.S.C. section 360bbb-3(b)(1), unless the authorization is terminated or revoked.  Performed at [facility], [place] [address], [place], [place] [address]       Radiology Studies: No results found.    Scheduled Meds: . aspirin  81 mg Oral Daily  . bisoprolol  2.5 mg Oral Daily  . clopidogrel  75 mg Oral Daily  . docusate sodium  100 mg Oral QODAY  . enoxaparin (LOVENOX) injection  40 mg Subcutaneous Q24H  . furosemide  40 mg Oral Daily  . latanoprost  1 drop Both Eyes QHS  . loratadine  10 mg Oral Daily  . sodium chloride flush  3 mL Intravenous Q12H   Continuous Infusions: . sodium chloride       LOS: 3 days      Time spent: 20 minutes   Dessa Phi, DO [facility] 12/09/2020, 1:30 PM   Available via Epic secure chat 7am-7pm After these hours, please refer to coverage provider listed on [email or web address]

## 2020-12-10 ENCOUNTER — Inpatient Hospital Stay (HOSPITAL_COMMUNITY)
Admission: EM | Disposition: A | Discharge status: Home / Self Care | Payer: Self-pay | Source: Home / Self Care | Attending: Internal Medicine

## 2020-12-10 DIAGNOSIS — E7800 Pure hypercholesterolemia, unspecified: Secondary | ICD-10-CM

## 2020-12-10 DIAGNOSIS — I2510 Atherosclerotic heart disease of native coronary artery without angina pectoris: Secondary | ICD-10-CM | POA: Diagnosis not present

## 2020-12-10 DIAGNOSIS — E871 Hypo-osmolality and hyponatremia: Secondary | ICD-10-CM | POA: Diagnosis not present

## 2020-12-10 DIAGNOSIS — I5041 Acute combined systolic (congestive) and diastolic (congestive) heart failure: Secondary | ICD-10-CM | POA: Diagnosis not present

## 2020-12-10 DIAGNOSIS — I5021 Acute systolic (congestive) heart failure: Secondary | ICD-10-CM | POA: Diagnosis not present

## 2020-12-10 DIAGNOSIS — J9601 Acute respiratory failure with hypoxia: Secondary | ICD-10-CM | POA: Diagnosis not present

## 2020-12-10 HISTORY — PX: RIGHT/LEFT HEART CATH AND CORONARY ANGIOGRAPHY: CATH118266

## 2020-12-10 LAB — POCT I-STAT EG7
Acid-Base Excess: 4 mmol/L — ABNORMAL HIGH (ref 0.0–2.0)
Acid-Base Excess: 4 mmol/L — ABNORMAL HIGH (ref 0.0–2.0)
Bicarbonate: 28.2 mmol/L — ABNORMAL HIGH (ref 20.0–28.0)
Bicarbonate: 28.2 mmol/L — ABNORMAL HIGH (ref 20.0–28.0)
Calcium, Ion: 1.1 mmol/L — ABNORMAL LOW (ref 1.15–1.40)
Calcium, Ion: 1.14 mmol/L — ABNORMAL LOW (ref 1.15–1.40)
HCT: 32 % — ABNORMAL LOW (ref 36.0–46.0)
HCT: 33 % — ABNORMAL LOW (ref 36.0–46.0)
Hemoglobin: 10.9 g/dL — ABNORMAL LOW (ref 12.0–15.0)
Hemoglobin: 11.2 g/dL — ABNORMAL LOW (ref 12.0–15.0)
O2 Saturation: 70 %
O2 Saturation: 76 %
Potassium: 3.3 mmol/L — ABNORMAL LOW (ref 3.5–5.1)
Potassium: 3.3 mmol/L — ABNORMAL LOW (ref 3.5–5.1)
Sodium: 134 mmol/L — ABNORMAL LOW (ref 135–145)
Sodium: 135 mmol/L (ref 135–145)
TCO2: 29 mmol/L (ref 22–32)
TCO2: 29 mmol/L (ref 22–32)
pCO2, Ven: 39.7 mmHg — ABNORMAL LOW (ref 44.0–60.0)
pCO2, Ven: 40.4 mmHg — ABNORMAL LOW (ref 44.0–60.0)
pH, Ven: 7.453 — ABNORMAL HIGH (ref 7.250–7.430)
pH, Ven: 7.458 — ABNORMAL HIGH (ref 7.250–7.430)
pO2, Ven: 35 mmHg (ref 32.0–45.0)
pO2, Ven: 38 mmHg (ref 32.0–45.0)

## 2020-12-10 LAB — POCT I-STAT 7, (LYTES, BLD GAS, ICA,H+H)
Acid-Base Excess: 2 mmol/L (ref 0.0–2.0)
Bicarbonate: 25.1 mmol/L (ref 20.0–28.0)
Calcium, Ion: 1.1 mmol/L — ABNORMAL LOW (ref 1.15–1.40)
HCT: 31 % — ABNORMAL LOW (ref 36.0–46.0)
Hemoglobin: 10.5 g/dL — ABNORMAL LOW (ref 12.0–15.0)
O2 Saturation: 99 %
Potassium: 3.2 mmol/L — ABNORMAL LOW (ref 3.5–5.1)
Sodium: 129 mmol/L — ABNORMAL LOW (ref 135–145)
TCO2: 26 mmol/L (ref 22–32)
pCO2 arterial: 33.4 mmHg (ref 32.0–48.0)
pH, Arterial: 7.485 — ABNORMAL HIGH (ref 7.350–7.450)
pO2, Arterial: 114 mmHg — ABNORMAL HIGH (ref 83.0–108.0)

## 2020-12-10 LAB — CBC
HCT: 32.1 % — ABNORMAL LOW (ref 36.0–46.0)
Hemoglobin: 10.4 g/dL — ABNORMAL LOW (ref 12.0–15.0)
MCH: 27.8 pg (ref 26.0–34.0)
MCHC: 32.4 g/dL (ref 30.0–36.0)
MCV: 85.8 fL (ref 80.0–100.0)
Platelets: 399 10*3/uL (ref 150–400)
RBC: 3.74 MIL/uL — ABNORMAL LOW (ref 3.87–5.11)
RDW: 14.3 % (ref 11.5–15.5)
WBC: 8.6 10*3/uL (ref 4.0–10.5)
nRBC: 0 % (ref 0.0–0.2)

## 2020-12-10 LAB — BASIC METABOLIC PANEL
Anion gap: 10 (ref 5–15)
BUN: 28 mg/dL — ABNORMAL HIGH (ref 8–23)
CO2: 26 mmol/L (ref 22–32)
Calcium: 8.9 mg/dL (ref 8.9–10.3)
Chloride: 96 mmol/L — ABNORMAL LOW (ref 98–111)
Creatinine, Ser: 1.15 mg/dL — ABNORMAL HIGH (ref 0.44–1.00)
GFR, Estimated: 47 mL/min — ABNORMAL LOW (ref >60)
Glucose, Bld: 120 mg/dL — ABNORMAL HIGH (ref 70–99)
Potassium: 3.7 mmol/L (ref 3.5–5.1)
Sodium: 132 mmol/L — ABNORMAL LOW (ref 135–145)

## 2020-12-10 LAB — LIPID PANEL
Cholesterol: 180 mg/dL (ref 0–200)
HDL: 40 mg/dL — ABNORMAL LOW (ref >40)
LDL Cholesterol: 111 mg/dL — ABNORMAL HIGH (ref 0–99)
Total CHOL/HDL Ratio: 4.5 RATIO
Triglycerides: 147 mg/dL (ref <150)
VLDL: 29 mg/dL (ref 0–40)

## 2020-12-10 SURGERY — RIGHT/LEFT HEART CATH AND CORONARY ANGIOGRAPHY
Anesthesia: LOCAL

## 2020-12-10 MED ORDER — SODIUM CHLORIDE 0.9% FLUSH
3.0000 mL | Freq: Two times a day (BID) | INTRAVENOUS | Status: DC
  Administered 2020-12-10 – 2020-12-11 (×2): 3 mL via INTRAVENOUS

## 2020-12-10 MED ORDER — ACETAMINOPHEN 325 MG PO TABS: 650.0000 mg | ORAL_TABLET | ORAL | Status: DC | PRN

## 2020-12-10 MED ORDER — ASPIRIN 81 MG PO CHEW: 81.0000 mg | CHEWABLE_TABLET | Freq: Every day | ORAL | Status: DC

## 2020-12-10 MED ORDER — IOHEXOL 350 MG/ML SOLN
INTRAVENOUS | Status: DC | PRN
  Administered 2020-12-10: 45 mL

## 2020-12-10 MED ORDER — LABETALOL HCL 5 MG/ML IV SOLN: 10.0000 mg | INTRAVENOUS | Status: AC | PRN

## 2020-12-10 MED ORDER — HEPARIN (PORCINE) IN NACL 1000-0.9 UT/500ML-% IV SOLN
INTRAVENOUS | Status: AC
  Filled 2020-12-10: qty 1000

## 2020-12-10 MED ORDER — ONDANSETRON HCL 4 MG/2ML IJ SOLN: 4.0000 mg | Freq: Four times a day (QID) | INTRAMUSCULAR | Status: DC | PRN

## 2020-12-10 MED ORDER — DIAZEPAM 2 MG PO TABS
2.0000 mg | ORAL_TABLET | Freq: Four times a day (QID) | ORAL | Status: DC | PRN
  Administered 2020-12-10 – 2020-12-11 (×2): 2 mg via ORAL
  Filled 2020-12-10 (×2): qty 1

## 2020-12-10 MED ORDER — HEPARIN SODIUM (PORCINE) 1000 UNIT/ML IJ SOLN
INTRAMUSCULAR | Status: DC | PRN
  Administered 2020-12-10: 2700 [IU] via INTRAVENOUS

## 2020-12-10 MED ORDER — LIDOCAINE HCL (PF) 1 % IJ SOLN
INTRAMUSCULAR | Status: DC | PRN
  Administered 2020-12-10: 3 mL
  Administered 2020-12-10: 4 mL

## 2020-12-10 MED ORDER — ENOXAPARIN SODIUM 40 MG/0.4ML IJ SOSY: 40.0000 mg | PREFILLED_SYRINGE | INTRAMUSCULAR | Status: DC

## 2020-12-10 MED ORDER — CLOPIDOGREL BISULFATE 75 MG PO TABS: 75.0000 mg | ORAL_TABLET | Freq: Every day | ORAL | Status: DC

## 2020-12-10 MED ORDER — SODIUM CHLORIDE 0.9 % IV SOLN: INTRAVENOUS | Status: AC

## 2020-12-10 MED ORDER — HYDRALAZINE HCL 20 MG/ML IJ SOLN: 10.0000 mg | INTRAMUSCULAR | Status: AC | PRN

## 2020-12-10 MED ORDER — ENOXAPARIN SODIUM 30 MG/0.3ML IJ SOSY: 30.0000 mg | PREFILLED_SYRINGE | INTRAMUSCULAR | Status: DC

## 2020-12-10 MED ORDER — SODIUM CHLORIDE 0.9% FLUSH: 3.0000 mL | INTRAVENOUS | Status: DC | PRN

## 2020-12-10 MED ORDER — VERAPAMIL HCL 2.5 MG/ML IV SOLN
INTRAVENOUS | Status: AC
  Filled 2020-12-10: qty 2

## 2020-12-10 MED ORDER — SODIUM CHLORIDE 0.9 % IV SOLN: 250.0000 mL | INTRAVENOUS | Status: DC | PRN

## 2020-12-10 MED ORDER — MIDAZOLAM HCL 2 MG/2ML IJ SOLN
INTRAMUSCULAR | Status: AC
  Filled 2020-12-10: qty 2

## 2020-12-10 MED ORDER — LIDOCAINE HCL (PF) 1 % IJ SOLN
INTRAMUSCULAR | Status: AC
  Filled 2020-12-10: qty 30

## 2020-12-10 MED ORDER — HEPARIN (PORCINE) IN NACL 1000-0.9 UT/500ML-% IV SOLN
INTRAVENOUS | Status: DC | PRN
  Administered 2020-12-10: 500 mL

## 2020-12-10 MED ORDER — MIDAZOLAM HCL 2 MG/2ML IJ SOLN
INTRAMUSCULAR | Status: DC | PRN
  Administered 2020-12-10 (×2): 1 mg via INTRAVENOUS

## 2020-12-10 MED ORDER — ENOXAPARIN SODIUM 30 MG/0.3ML IJ SOSY
30.0000 mg | PREFILLED_SYRINGE | INTRAMUSCULAR | Status: DC
  Administered 2020-12-11: 30 mg via SUBCUTANEOUS
  Filled 2020-12-10: qty 0.3

## 2020-12-10 MED ORDER — PROCHLORPERAZINE EDISYLATE 10 MG/2ML IJ SOLN
10.0000 mg | Freq: Four times a day (QID) | INTRAMUSCULAR | Status: DC | PRN
  Administered 2020-12-10: 10 mg via INTRAVENOUS
  Filled 2020-12-10 (×2): qty 2

## 2020-12-10 MED ORDER — HEPARIN SODIUM (PORCINE) 1000 UNIT/ML IJ SOLN
INTRAMUSCULAR | Status: AC
  Filled 2020-12-10: qty 1

## 2020-12-10 SURGICAL SUPPLY — 14 items
CATH OPTITORQUE TIG 4.0 5F (CATHETERS) ×2 IMPLANT
CATH SWAN GANZ 7F STRAIGHT (CATHETERS) ×2 IMPLANT
DEVICE RAD TR BAND REGULAR (VASCULAR PRODUCTS) ×2 IMPLANT
GLIDESHEATH SLEND SS 6F .021 (SHEATH) ×2 IMPLANT
GLIDESHEATH SLENDER 7FR .021G (SHEATH) ×2 IMPLANT
GUIDEWIRE .025 260CM (WIRE) ×2 IMPLANT
GUIDEWIRE INQWIRE 1.5J.035X260 (WIRE) ×1 IMPLANT
INQWIRE 1.5J .035X260CM (WIRE) ×2
KIT HEART LEFT (KITS) ×2 IMPLANT
PACK CARDIAC CATHETERIZATION (CUSTOM PROCEDURE TRAY) ×2 IMPLANT
SHEATH PROBE COVER 6X72 (BAG) ×2 IMPLANT
TRANSDUCER W/STOPCOCK (MISCELLANEOUS) ×2 IMPLANT
TUBING CIL FLEX 10 FLL-RA (TUBING) ×2 IMPLANT
WIRE HI TORQ VERSACORE-J 145CM (WIRE) ×2 IMPLANT

## 2020-12-10 NOTE — Plan of Care (Signed)
  Problem: Activity: Goal: Capacity to carry out activities will improve Outcome: Completed/Met   Problem: Safety: Goal: Ability to remain free from injury will improve Outcome: Completed/Met   Problem: Skin Integrity: Goal: Risk for impaired skin integrity will decrease Outcome: Completed/Met   Problem: Pain Managment: Goal: General experience of comfort will improve Outcome: Completed/Met   Problem: Elimination: Goal: Will not experience complications related to urinary retention Outcome: Completed/Met   Problem: Activity: Goal: Risk for activity intolerance will decrease Outcome: Progressing

## 2020-12-10 NOTE — Progress Notes (Signed)
MD,Pt had some blood come up when she coughed this am, pt is on Lovenox, and has a cardiac cath this am, on call MD notified please address, thanks Arvella Nigh RN.

## 2020-12-10 NOTE — Interval H&P Note (Signed)
Cath Lab Visit (complete for each Cath Lab visit)  Clinical Evaluation Leading to the Procedure:   ACS: No.  Non-ACS:    Anginal Classification: CCS I  Anti-ischemic medical therapy: Minimal Therapy (1 class of medications)  Non-Invasive Test Results: No non-invasive testing performed  Prior CABG: No previous CABG      History and Physical Interval Note:  12/10/2020 3:05 PM  Patricia Garcia  has presented today for surgery, with the diagnosis of NSTEMI.  The various methods of treatment have been discussed with the patient and family. After consideration of risks, benefits and other options for treatment, the patient has consented to  Procedure(s): RIGHT/LEFT HEART CATH AND CORONARY ANGIOGRAPHY (N/A) as a surgical intervention.  The patient's history has been reviewed, patient examined, no change in status, stable for surgery.  I have reviewed the patient's chart and labs.  Questions were answered to the patient's satisfaction.     Shelva Majestic

## 2020-12-10 NOTE — Progress Notes (Signed)
Heart Failure Navigation Team Progress Note  PCP: Lajean Manes, MD Primary Cardiologist: Gwyndolyn Kaufman, MD Admitted from: home  Past Medical History:  Diagnosis Date  . Anal or rectal pain   . Anemia   . Arthritis   . CAD (coronary artery disease)    a. NSTEMI 04/2020 s/p DES to [facility] and D1.  . Chronic diastolic CHF (congestive heart failure) ([place])   . Depression   . Diverticulosis of colon (without mention of hemorrhage)   . Hypertension   . Hypoalbuminemia   . Internal hemorrhoids without mention of complication   . Ischemic heart disease   . LBBB (left bundle branch block)   . Osteopenia   . Renal insufficiency   . S/P right hip fracture   . Slow transit constipation     Social History   Socioeconomic History  . Marital status: Widowed    Spouse name: Not on file  . Number of children: 2  . Years of education: 45  . Highest education level: High school graduate  Occupational History  . Occupation: Retired    Fish farm manager: RETIRED  Tobacco Use  . Smoking status: Former Smoker    Quit date: 06/05/1984    Years since quitting: 36.5  . Smokeless tobacco: Never Used  Vaping Use  . Vaping Use: Never used  Substance and Sexual Activity  . Alcohol use: No  . Drug use: No  . Sexual activity: Not on file  Other Topics Concern  . 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 Difficulty of Paying Living Expenses: Not very hard  Food Insecurity: No Food Insecurity  . Worried About Charity fundraiser in the Last Year: Never true  . Ran Out of Food in the Last Year: Never true  Transportation Needs: No Transportation Needs  . Lack of Transportation (Medical): No  . Lack of Transportation (Non-Medical): No  Physical Activity: Insufficiently Active  . Days of Exercise per Week: 2 days  .  Minutes of Exercise per Session: 20 min  Stress: No Stress Concern Present  . Feeling of Stress : Not at all  Social Connections: Moderately Integrated  . Frequency of Communication with Friends and Family: More than three times a week  . Frequency of Social Gatherings with Friends and Family: More than three times a week  . Attends Religious Services: 1 to 4 times per year  . Active Member of Clubs or Organizations: Yes  . Attends Archivist Meetings: 1 to 4 times per year  . Marital Status: Widowed     Heart & Vascular Transition of Care Clinic follow-up: Scheduled for Friday 12/21/2020. Confirmed patient has transportation.  Immediate social needs: Patient's daughter indicated that her mom isn't eating much and would like to see about food delivery. CSW will make referral to [facility] for food delivery upon patients discharge.  Laketia Vicknair, MSW, [place] Heart Failure Social Worker

## 2020-12-10 NOTE — Progress Notes (Signed)
PROGRESS NOTE    Patricia Garcia  JKD:[id number] DOB: 1932-09-11 DOA: 12/06/2020 PCP: Lajean Manes, MD     Brief Narrative:  Patricia Garcia is a 87 y.o. female with medical history significant of hypertension, CAD s/p DES x2 03/9832, diastolic CHF, depression, and anemia presents with complaints of nausea and vomiting.  She has been dealing with sinus congestion and drainage over the last 2 weeks.  Noted associated symptoms of sinus pressure, shortness of breath and productive cough.  She was seen at urgent care on 5/14, and was diagnosed with a acute sinusitis with bronchitis prescribe doxycycline 1 tablet twice daily.  However, after getting home patient reported that every time that she took the doxycycline may cause her stomach to be upset her to have nausea and vomiting.  She continue to try and take the medication for the next 4 days.  However her symptoms of nausea and vomiting were not getting better.  Patient reported inability to lay flat at night due to feeling short of breath and was using 2 pillows to prop her self up.  They had attempted to switch her to cephalexin yesterday, but because of her progressive worsening shortness of breath came to the hospital for further evaluation.  Denies having any significant fever, abdominal pain, diarrhea, or leg swelling.  Normally she does not require oxygen at baseline.  Chest x-ray was significant for small bilateral pleural effusions increased since October with diffuse interstitial opacity suspicious for edema and stable 8 mm right renal calculus.  Patient was given full dose aspirin, Zofran, and furosemide 40 mg IV. Cardiology consulted for acute on chronic diastolic HF.   New events last 24 hours / Subjective: States that she got Xanax last night, was able to sleep a couple of hours but has been up since.  Had a coughing episode, blood clot came up.  She has been having a dry cough, had episode of nosebleed yesterday.  Assessment & Plan:    Principal Problem:   Respiratory failure with hypoxia (HCC) Active Problems:   GERD   CAD (coronary artery disease)   Nausea and vomiting   Prolonged QT interval   Hyponatremia   Nephrolithiasis   Acute combined systolic and diastolic heart failure (HCC)   Acute on chronic systolic and diastolic heart failure -BNP 1701.2 -Strict I's and O's, daily weight, fluid restriction diet -Repeat echocardiogram showed EF 25 to 30%, with regional wall motion abnormality, severely reduced EF compared to previous in 2021 -Cardiology following -Continue PO lasix, bisoprolol  -Pending heart cath today  Hyponatremia -Hypervolemic -Improving, continue to monitor while on Lasix=  CKD stage 3a -Stable   CAD -Continue aspirin, Plavix  Nausea and vomiting -Resolved   DVT prophylaxis:  enoxaparin (LOVENOX) injection 30 mg Start: 12/11/20 1000  Code Status:     Code Status Orders  (From admission, onward)         Start     Ordered   12/06/20 0856  Full code  Continuous        12/06/20 0857        Code Status History    Date Active Date Inactive Code Status Order ID Comments User Context   05/06/2020 1749 05/10/2020 2236 Full Code [id number]  Harold Hedge, MD ED   05/01/2020 0018 05/04/2020 2129 Full Code [id number]  Elwyn Reach, MD ED   Advance Care Planning Activity     Family Communication: None at bedside Disposition Plan:  Status is: Inpatient  Remains  inpatient appropriate because:Ongoing diagnostic testing needed not appropriate for outpatient work up and Inpatient level of care appropriate due to severity of illness   Dispo: The patient is from: Home              Anticipated d/c is to: Home              Patient currently is not medically stable to d/c.  Planning for heart cath today   Difficult to place patient No      Consultants:   Cardiology     Antimicrobials:  Anti-infectives (From admission, onward)   None       Objective: Vitals:    12/10/20 0528 12/10/20 0731 12/10/20 0921 12/10/20 1116  BP: 117/71  124/82 123/77  Pulse: 91  89 91  Resp: 16  18 16   Temp: 97.7 F (36.5 C)  98 F (36.7 C) 97.9 F (36.6 C)  TempSrc: Oral  Oral   SpO2: 92% 96% 98% 98%  Weight:      Height:        Intake/Output Summary (Last 24 hours) at 12/10/2020 1236 Last data filed at 12/10/2020 1144 Gross per 24 hour  Intake 145.89 ml  Output 1400 ml  Net -1254.11 ml   Filed Weights   12/08/20 0444 12/09/20 0309 12/10/20 0140  Weight: 54.2 kg 53.9 kg 54.1 kg    Examination: General exam: Appears calm and comfortable  Respiratory system: Clear to auscultation. Respiratory effort normal. Cardiovascular system: S1 & S2 heard, RRR. No pedal edema. Gastrointestinal system: Abdomen is nondistended, soft and nontender. Normal bowel sounds heard. Central nervous system: Alert and oriented. Non focal exam. Speech clear  Extremities: Symmetric in appearance bilaterally  Skin: No rashes, lesions or ulcers on exposed skin  Psychiatry: Judgement and insight appear stable. Mood & affect appropriate.   Data Reviewed: I have personally reviewed following labs and imaging studies  CBC: Recent Labs  Lab 12/06/20 0257 12/10/20 0511  WBC 8.3 8.6  HGB 10.0* 10.4*  HCT 30.0* 32.1*  MCV 84.3 85.8  PLT 386 536   Basic Metabolic Panel: Recent Labs  Lab 12/06/20 0257 12/06/20 0630 12/06/20 1547 12/07/20 0504 12/08/20 0238 12/09/20 0310 12/10/20 0511  NA 125*  --  128* 127* 132* 131* 132*  K 3.5  --   --  3.8 4.0 4.2 3.7  CL 94*  --   --  94* 93* 94* 96*  CO2 19*  --   --  25 31 25 26   GLUCOSE 175*  --   --  103* 98 118* 120*  BUN 15  --   --  21 27* 25* 28*  CREATININE 0.86  --   --  1.05* 1.00 1.08* 1.15*  CALCIUM 9.2  --   --  8.8* 9.1 9.0 8.9  MG  --  1.7  --  1.8  --   --   --    GFR: Estimated Creatinine Clearance: 27 mL/min (A) (by C-G formula based on SCr of 1.15 mg/dL (H)). Liver Function Tests: Recent Labs  Lab  12/06/20 0257  AST 45*  ALT 44  ALKPHOS 62  BILITOT 0.7  PROT 6.6  ALBUMIN 3.4*   Recent Labs  Lab 12/06/20 0257  LIPASE 34   No results for input(s): AMMONIA in the last 168 hours. Coagulation Profile: No results for input(s): INR, PROTIME in the last 168 hours. Cardiac Enzymes: No results for input(s): CKTOTAL, CKMB, CKMBINDEX, TROPONINI in the last 168 hours. BNP (last  3 results) No results for input(s): PROBNP in the last 8760 hours. HbA1C: No results for input(s): HGBA1C in the last 72 hours. CBG: No results for input(s): GLUCAP in the last 168 hours. Lipid Profile: Recent Labs    12/10/20 0511  CHOL 180  HDL 40*  LDLCALC 111*  TRIG 147  CHOLHDL 4.5   Thyroid Function Tests: No results for input(s): TSH, T4TOTAL, FREET4, T3FREE, THYROIDAB in the last 72 hours. Anemia Panel: No results for input(s): VITAMINB12, FOLATE, FERRITIN, TIBC, IRON, RETICCTPCT in the last 72 hours. Sepsis Labs: No results for input(s): PROCALCITON, LATICACIDVEN in the last 168 hours.  Recent Results (from the past 240 hour(s))  Novel Coronavirus, NAA ([facility])     Status: None   Collection Time: 12/01/20  8:55 AM   Specimen: Nasopharyngeal Swab; Nasopharyngeal(NP) swabs in vial transport medium   Nasopharynge  Result Value Ref Range Status   SARS-CoV-2, NAA Not Detected Not Detected Final    Comment: This nucleic acid amplification test was developed and its performance characteristics determined by [employer], [employer] and [employer]. Nucleic acid amplification tests include RT-PCR and TMA. This test has not been FDA cleared or approved. This test has been authorized by FDA under an Emergency Use Authorization (EUA). This test is only authorized for the duration of time the declaration that circumstances exist justifying the authorization of the emergency use of in vitro diagnostic tests for detection of SARS-CoV-2 virus and/or diagnosis of COVID-19 infection under section 564(b)(1) of the Act,  21 U.S.C. [id number]) (1), unless the authorization is terminated or revoked sooner. When diagnostic testing is negative, the possibility of a false negative result should be considered in the context of a patient's recent exposures and the presence of clinical signs and symptoms consistent with COVID-19. An individual without symptoms of COVID-19 and who is not shedding SARS-CoV-2 virus wo uld expect to have a negative (not detected) result in this assay.   SARS-COV-2, NAA 2 DAY TAT     Status: None   Collection Time: 12/01/20  8:55 AM   Nasopharynge  Result Value Ref Range Status   SARS-CoV-2, NAA 2 DAY TAT Performed  Final  Resp Panel by RT-PCR (Flu A&B, Covid) Nasopharyngeal Swab     Status: None   Collection Time: 12/06/20  4:36 AM   Specimen: Nasopharyngeal Swab; Nasopharyngeal(NP) swabs in vial transport medium  Result Value Ref Range Status   SARS Coronavirus 2 by RT PCR NEGATIVE NEGATIVE Final    Comment: (NOTE) SARS-CoV-2 target nucleic acids are NOT DETECTED.  The SARS-CoV-2 RNA is generally detectable in upper respiratory specimens during the acute phase of infection. The lowest concentration of SARS-CoV-2 viral copies this assay can detect is 138 copies/mL. A negative result does not preclude SARS-Cov-2 infection and should not be used as the sole basis for treatment or other patient management decisions. A negative result may occur with  improper specimen collection/handling, submission of specimen other than nasopharyngeal swab, presence of viral mutation(s) within the areas targeted by this assay, and inadequate number of viral copies(<138 copies/mL). A negative result must be combined with clinical observations, patient history, and epidemiological information. The expected result is Negative.  Fact Sheet for Patients:  EntrepreneurPulse.com.au  Fact Sheet for Healthcare Providers:  IncredibleEmployment.be  This test is no  t yet approved or cleared by the [place] FDA and  has been authorized for detection and/or diagnosis of SARS-CoV-2 by FDA under an Emergency Use Authorization (EUA). This EUA will remain  in effect (meaning this  test can be used) for the duration of the COVID-19 declaration under Section 564(b)(1) of the Act, 21 U.S.C.section 360bbb-3(b)(1), unless the authorization is terminated  or revoked sooner.       Influenza A by PCR NEGATIVE NEGATIVE Final   Influenza B by PCR NEGATIVE NEGATIVE Final    Comment: (NOTE) The Xpert Xpress SARS-CoV-2/FLU/RSV plus assay is intended as an aid in the diagnosis of influenza from Nasopharyngeal swab specimens and should not be used as a sole basis for treatment. Nasal washings and aspirates are unacceptable for Xpert Xpress SARS-CoV-2/FLU/RSV testing.  Fact Sheet for Patients: EntrepreneurPulse.com.au  Fact Sheet for Healthcare Providers: IncredibleEmployment.be  This test is not yet approved or cleared by the [place] FDA and has been authorized for detection and/or diagnosis of SARS-CoV-2 by FDA under an Emergency Use Authorization (EUA). This EUA will remain in effect (meaning this test can be used) for the duration of the COVID-19 declaration under Section 564(b)(1) of the Act, 21 U.S.C. section 360bbb-3(b)(1), unless the authorization is terminated or revoked.  Performed at [facility], [place] [address] [place], [place] [address]       Radiology Studies: No results found.    Scheduled Meds: . aspirin  81 mg Oral Daily  . bisoprolol  2.5 mg Oral Daily  . clopidogrel  75 mg Oral Daily  . docusate sodium  100 mg Oral QODAY  . [START ON 12/11/2020] enoxaparin (LOVENOX) injection  30 mg Subcutaneous Q24H  . furosemide  40 mg Oral Daily  . latanoprost  1 drop Both Eyes QHS  . loratadine  10 mg Oral Daily  . sodium chloride flush  3 mL Intravenous Q12H   Continuous Infusions: .  sodium chloride    . sodium chloride 10 mL/hr at 12/10/20 0628     LOS: 4 days      Time spent: 20 minutes   Dessa Phi, DO [facility] 12/10/2020, 12:36 PM   Available via Epic secure chat 7am-7pm After these hours, please refer to coverage provider listed on [email or web address]

## 2020-12-10 NOTE — Plan of Care (Signed)
  Problem: Activity: Goal: Risk for activity intolerance will decrease Outcome: Progressing   Problem: Coping: Goal: Level of anxiety will decrease Outcome: Progressing   

## 2020-12-10 NOTE — Progress Notes (Signed)
PT Cancellation Note  Patient Details Name: SAMUEL RITTENHOUSE MRN: [id number] DOB: 07-26-1932   Cancelled Treatment:    Reason Eval/Treat Not Completed: Patient declined, no reason specified. Patient having heart cath later today and is so hungry and feels too weak to walk right now. Will re-attempt after procedure.   Mahsa Hanser 12/10/2020, 11:02 AM

## 2020-12-10 NOTE — Progress Notes (Signed)
Progress Note  Patient Name: ANWYN KRIEGEL Date of Encounter: 12/10/2020  [facility] Cardiologist: Freada Bergeron, MD   Subjective   No acute events overnight. Did not sleep well even with low dose Xanax. Reviewed plans for cath today. Has been having dry cough, though did cough up blood tinged phlegm overnight. No further issues.  Inpatient Medications    Scheduled Meds: . aspirin  81 mg Oral Daily  . bisoprolol  2.5 mg Oral Daily  . clopidogrel  75 mg Oral Daily  . docusate sodium  100 mg Oral QODAY  . enoxaparin (LOVENOX) injection  40 mg Subcutaneous Q24H  . furosemide  40 mg Oral Daily  . latanoprost  1 drop Both Eyes QHS  . loratadine  10 mg Oral Daily  . sodium chloride flush  3 mL Intravenous Q12H   Continuous Infusions: . sodium chloride    . sodium chloride 10 mL/hr at 12/10/20 0628   PRN Meds: sodium chloride, acetaminophen, ALPRAZolam, alum & mag hydroxide-simeth, sodium chloride flush   Vital Signs    Vitals:   12/09/20 1936 12/10/20 0140 12/10/20 0528 12/10/20 0731  BP: 124/73  117/71   Pulse: (!) 102  91   Resp: 16  16   Temp: 98 F (36.7 C)  97.7 F (36.5 C)   TempSrc: Oral  Oral   SpO2: 95%  92% 96%  Weight:  54.1 kg    Height:        Intake/Output Summary (Last 24 hours) at 12/10/2020 0908 Last data filed at 12/10/2020 0100 Gross per 24 hour  Intake 100 ml  Output 1300 ml  Net -1200 ml   Last 3 Weights 12/10/2020 12/09/2020 12/08/2020  Weight (lbs) 119 lb 3.2 oz 118 lb 14.4 oz 119 lb 7.8 oz  Weight (kg) 54.069 kg 53.933 kg 54.2 kg      Telemetry    Appears to be sinus with PVCs, though difficult to see P waves due to long PR - Personally Reviewed  ECG    No new since 12/06/20 - Personally Reviewed  Physical Exam   GEN: No acute distress.   Neck: No JVD Cardiac: RRR, no murmurs, rubs, or gallops.  Respiratory: Clear to auscultation bilaterally except for slight rales at L base GI: Soft, nontender, non-distended  MS:  No edema; No deformity. Neuro:  Nonfocal  Psych: Normal affect   Labs    High Sensitivity Troponin:   Recent Labs  Lab 12/06/20 0521 12/06/20 0630  TROPONINIHS 15 16      Chemistry Recent Labs  Lab 12/06/20 0257 12/06/20 1547 12/08/20 0238 12/09/20 0310 12/10/20 0511  NA 125*   < > 132* 131* 132*  K 3.5   < > 4.0 4.2 3.7  CL 94*   < > 93* 94* 96*  CO2 19*   < > 31 25 26   GLUCOSE 175*   < > 98 118* 120*  BUN 15   < > 27* 25* 28*  CREATININE 0.86   < > 1.00 1.08* 1.15*  CALCIUM 9.2   < > 9.1 9.0 8.9  PROT 6.6  --   --   --   --   ALBUMIN 3.4*  --   --   --   --   AST 45*  --   --   --   --   ALT 44  --   --   --   --   ALKPHOS 62  --   --   --   --  BILITOT 0.7  --   --   --   --   GFRNONAA >60   < > 55* 50* 47*  ANIONGAP 12   < > 8 12 10    < > = values in this interval not displayed.     Hematology Recent Labs  Lab 12/06/20 0257 12/10/20 0511  WBC 8.3 8.6  RBC 3.56* 3.74*  HGB 10.0* 10.4*  HCT 30.0* 32.1*  MCV 84.3 85.8  MCH 28.1 27.8  MCHC 33.3 32.4  RDW 13.4 14.3  PLT 386 399    BNP Recent Labs  Lab 12/06/20 0520  BNP 1,701.2*     DDimer No results for input(s): DDIMER in the last 168 hours.   Radiology    No results found.  Cardiac Studies   Echo 12/07/20 1. Left ventricular ejection fraction, by estimation, is 25 to 30%. The  left ventricle has severely decreased function. The left ventricle  demonstrates regional wall motion abnormalities.      There is severe hypokinesis of the basal-to-mid inferior, basal  inferolateral, and all septal segments. Septal motion is paradoxical due  to LBBB. There is mild asymmetric left ventricular hypertrophy of the  basal-septal segment. Indeterminate  diastolic filling due to E-A fusion.  2. Right ventricular systolic function is mildly reduced. The right  ventricular size is mildly enlarged. There is normal pulmonary artery  systolic pressure.  3. The mitral valve is normal in  structure. Mild to moderate mitral valve  regurgitation. Moderate mitral annular calcification.  4. The aortic valve is tricuspid. There is mild calcification of the  aortic valve. There is mild thickening of the aortic valve. Aortic valve  regurgitation is trivial. Mild aortic valve sclerosis is present, with no  evidence of aortic valve stenosis.  5. The inferior vena cava is dilated in size with >50% respiratory  variability, suggesting right atrial pressure of 8 mmHg.   Comparison(s): Compared to prior TTE in 04/2020, the LVEF is now severely  reduced (previously 50%) with elevated filling pressures.   Patient Profile     87 y.o. female with PMH CAD s/p prior PCI, hypertension, LBBB, chronic diastolic heart failure, admitted for acute on chronic heart failure and found to have new reduction in LVEF.  Assessment & Plan    Acute systolic heart failure Acute on chronic diastolic heart failure -new EF reduction to 25-30%, prior 50-55%. Regional wall motion abnormalities noted. -see prior notes this admission. Had stopped metoprolol, losartan, ezetimibe due to nonspecific side effects.  -started bisoprolol this admission -BP borderline. Will start ARB this admission if BP tolerates. -pending cath today given reduction in EF -admission weight 55.8 kg, weight today 54.1 kg, charted net negative 3.3 L -Cr up slightly to 1.15, nadir this admission 1.00  CAD s/p prior PCI -continue aspirin, clopidogrel  Hypercholesterolemia -LDL this admission 111, goal LDL <70 -reports intolerances to statins, side effects on ezetimibe  Hyponatremia: 128 on admission, nadir 127, today improved to 132  Anemia, chronic: -Hgb 10 on admission, 10.4 today  For questions or updates, please contact [facility] Please consult [email or web address] for contact info under        Signed, Buford Dresser, MD  12/10/2020, 9:08 AM

## 2020-12-10 NOTE — Plan of Care (Signed)
  Problem: Cardiac: Goal: Ability to achieve and maintain adequate cardiopulmonary perfusion will improve 12/10/2020 1010 by Mariane Baumgarten, RN Outcome: Progressing 12/10/2020 1009 by Mariane Baumgarten, RN Outcome: Progressing   Problem: Clinical Measurements: Goal: Ability to maintain clinical measurements within normal limits will improve 12/10/2020 1010 by Mariane Baumgarten, RN Outcome: Progressing 12/10/2020 1009 by Mariane Baumgarten, RN Outcome: Progressing Goal: Will remain free from infection 12/10/2020 1010 by Mariane Baumgarten, RN Outcome: Progressing 12/10/2020 1009 by Mariane Baumgarten, RN Outcome: Progressing Goal: Diagnostic test results will improve 12/10/2020 1010 by Mariane Baumgarten, RN Outcome: Progressing 12/10/2020 1009 by Mariane Baumgarten, RN Outcome: Progressing Goal: Respiratory complications will improve 12/10/2020 1010 by Mariane Baumgarten, RN Outcome: Progressing 12/10/2020 1009 by Mariane Baumgarten, RN Outcome: Progressing Goal: Cardiovascular complication will be avoided 12/10/2020 1010 by Mariane Baumgarten, RN Outcome: Progressing 12/10/2020 1009 by Mariane Baumgarten, RN Outcome: Progressing

## 2020-12-10 NOTE — H&P (View-Only) (Signed)
Progress Note  Patient Name: Patricia Garcia Date of Encounter: 12/10/2020  [facility] Cardiologist: Freada Bergeron, MD   Subjective   No acute events overnight. Did not sleep well even with low dose Xanax. Reviewed plans for cath today. Has been having dry cough, though did cough up blood tinged phlegm overnight. No further issues.  Inpatient Medications    Scheduled Meds: . aspirin  81 mg Oral Daily  . bisoprolol  2.5 mg Oral Daily  . clopidogrel  75 mg Oral Daily  . docusate sodium  100 mg Oral QODAY  . enoxaparin (LOVENOX) injection  40 mg Subcutaneous Q24H  . furosemide  40 mg Oral Daily  . latanoprost  1 drop Both Eyes QHS  . loratadine  10 mg Oral Daily  . sodium chloride flush  3 mL Intravenous Q12H   Continuous Infusions: . sodium chloride    . sodium chloride 10 mL/hr at 12/10/20 0628   PRN Meds: sodium chloride, acetaminophen, ALPRAZolam, alum & mag hydroxide-simeth, sodium chloride flush   Vital Signs    Vitals:   12/09/20 1936 12/10/20 0140 12/10/20 0528 12/10/20 0731  BP: 124/73  117/71   Pulse: (!) 102  91   Resp: 16  16   Temp: 98 F (36.7 C)  97.7 F (36.5 C)   TempSrc: Oral  Oral   SpO2: 95%  92% 96%  Weight:  54.1 kg    Height:        Intake/Output Summary (Last 24 hours) at 12/10/2020 0908 Last data filed at 12/10/2020 0100 Gross per 24 hour  Intake 100 ml  Output 1300 ml  Net -1200 ml   Last 3 Weights 12/10/2020 12/09/2020 12/08/2020  Weight (lbs) 119 lb 3.2 oz 118 lb 14.4 oz 119 lb 7.8 oz  Weight (kg) 54.069 kg 53.933 kg 54.2 kg      Telemetry    Appears to be sinus with PVCs, though difficult to see P waves due to long PR - Personally Reviewed  ECG    No new since 12/06/20 - Personally Reviewed  Physical Exam   GEN: No acute distress.   Neck: No JVD Cardiac: RRR, no murmurs, rubs, or gallops.  Respiratory: Clear to auscultation bilaterally except for slight rales at L base GI: Soft, nontender, non-distended  MS:  No edema; No deformity. Neuro:  Nonfocal  Psych: Normal affect   Labs    High Sensitivity Troponin:   Recent Labs  Lab 12/06/20 0521 12/06/20 0630  TROPONINIHS 15 16      Chemistry Recent Labs  Lab 12/06/20 0257 12/06/20 1547 12/08/20 0238 12/09/20 0310 12/10/20 0511  NA 125*   < > 132* 131* 132*  K 3.5   < > 4.0 4.2 3.7  CL 94*   < > 93* 94* 96*  CO2 19*   < > 31 25 26   GLUCOSE 175*   < > 98 118* 120*  BUN 15   < > 27* 25* 28*  CREATININE 0.86   < > 1.00 1.08* 1.15*  CALCIUM 9.2   < > 9.1 9.0 8.9  PROT 6.6  --   --   --   --   ALBUMIN 3.4*  --   --   --   --   AST 45*  --   --   --   --   ALT 44  --   --   --   --   ALKPHOS 62  --   --   --   --  BILITOT 0.7  --   --   --   --   GFRNONAA >60   < > 55* 50* 47*  ANIONGAP 12   < > 8 12 10    < > = values in this interval not displayed.     Hematology Recent Labs  Lab 12/06/20 0257 12/10/20 0511  WBC 8.3 8.6  RBC 3.56* 3.74*  HGB 10.0* 10.4*  HCT 30.0* 32.1*  MCV 84.3 85.8  MCH 28.1 27.8  MCHC 33.3 32.4  RDW 13.4 14.3  PLT 386 399    BNP Recent Labs  Lab 12/06/20 0520  BNP 1,701.2*     DDimer No results for input(s): DDIMER in the last 168 hours.   Radiology    No results found.  Cardiac Studies   Echo 12/07/20 1. Left ventricular ejection fraction, by estimation, is 25 to 30%. The  left ventricle has severely decreased function. The left ventricle  demonstrates regional wall motion abnormalities.      There is severe hypokinesis of the basal-to-mid inferior, basal  inferolateral, and all septal segments. Septal motion is paradoxical due  to LBBB. There is mild asymmetric left ventricular hypertrophy of the  basal-septal segment. Indeterminate  diastolic filling due to E-A fusion.  2. Right ventricular systolic function is mildly reduced. The right  ventricular size is mildly enlarged. There is normal pulmonary artery  systolic pressure.  3. The mitral valve is normal in  structure. Mild to moderate mitral valve  regurgitation. Moderate mitral annular calcification.  4. The aortic valve is tricuspid. There is mild calcification of the  aortic valve. There is mild thickening of the aortic valve. Aortic valve  regurgitation is trivial. Mild aortic valve sclerosis is present, with no  evidence of aortic valve stenosis.  5. The inferior vena cava is dilated in size with >50% respiratory  variability, suggesting right atrial pressure of 8 mmHg.   Comparison(s): Compared to prior TTE in 04/2020, the LVEF is now severely  reduced (previously 50%) with elevated filling pressures.   Patient Profile     87 y.o. female with PMH CAD s/p prior PCI, hypertension, LBBB, chronic diastolic heart failure, admitted for acute on chronic heart failure and found to have new reduction in LVEF.  Assessment & Plan    Acute systolic heart failure Acute on chronic diastolic heart failure -new EF reduction to 25-30%, prior 50-55%. Regional wall motion abnormalities noted. -see prior notes this admission. Had stopped metoprolol, losartan, ezetimibe due to nonspecific side effects.  -started bisoprolol this admission -BP borderline. Will start ARB this admission if BP tolerates. -pending cath today given reduction in EF -admission weight 55.8 kg, weight today 54.1 kg, charted net negative 3.3 L -Cr up slightly to 1.15, nadir this admission 1.00  CAD s/p prior PCI -continue aspirin, clopidogrel  Hypercholesterolemia -LDL this admission 111, goal LDL <70 -reports intolerances to statins, side effects on ezetimibe  Hyponatremia: 128 on admission, nadir 127, today improved to 132  Anemia, chronic: -Hgb 10 on admission, 10.4 today  For questions or updates, please contact [facility] Please consult [email or web address] for contact info under        Signed, Buford Dresser, MD  12/10/2020, 9:08 AM

## 2020-12-10 NOTE — Progress Notes (Signed)
Heart Failure Stewardship Pharmacist Progress Note   PCP: Lajean Manes, MD PCP-Cardiologist: Freada Bergeron, MD    HPI:  87 yo F with PMH of CAD s/p PCI, HTN, LBBB, and CHF. She presented to the ED on 12/06/20 with nausea, vomiting, and shortness of breath. CXR showed small bilateral effusions that appear increased since October with continued diffuse pulmonary interstitial opacity suspicious for edema. BNP elevated 1701. Hs Trop negative x2. She was then admitted for CHF exacerbation. An ECHO was done on 12/07/20 and LVEF is now reduced to 25-30% (was 50-55% in Oct 2021) and mildly reduced RV systolic function. Pending LHC today.  Current HF Medications: Furosemide 40 mg daily Bisoprolol 2.5 mg daily  Prior to admission HF Medications: None  Pertinent Lab Values: . Serum creatinine 1.15, BUN 28, Potassium 3.7, Sodium 132, BNP 1701.2   Vital Signs: . Weight: 119 lbs (admission weight: 123 lbs) . Blood pressure: 120/70s  . Heart rate: 90s   Medication Assistance / Insurance Benefits Check: Does the patient have prescription insurance?  Yes Type of insurance plan: Medicare + BCBS supplement  Outpatient Pharmacy:  Prior to admission outpatient pharmacy: [facility] Is the patient willing to use [facility] pharmacy at discharge? No Is the patient willing to transition their outpatient pharmacy to utilize a [facility] outpatient pharmacy?   No    Assessment: 1. Acute on chronic systolic CHF (EF 16-96%), ischemic evaluation pending. NYHA class II symptoms. - Continue furosemide 40 mg daily - Continue bisoprolol 2.5 mg daily - Consider starting low dose ARB following cath   Plan: 1) Medication changes recommended at this time: - Start low dose ARB following cath   2) Patient assistance: - None pending  3)  Education  - To be completed prior to discharge  Kerby Nora, PharmD, BCPS Heart Failure Stewardship Pharmacist Phone [phone]

## 2020-12-11 ENCOUNTER — Encounter (HOSPITAL_COMMUNITY): Payer: Self-pay | Admitting: Cardiovascular Disease

## 2020-12-11 DIAGNOSIS — J9621 Acute and chronic respiratory failure with hypoxia: Secondary | ICD-10-CM | POA: Diagnosis not present

## 2020-12-11 DIAGNOSIS — I2510 Atherosclerotic heart disease of native coronary artery without angina pectoris: Secondary | ICD-10-CM | POA: Diagnosis not present

## 2020-12-11 DIAGNOSIS — I5041 Acute combined systolic (congestive) and diastolic (congestive) heart failure: Secondary | ICD-10-CM | POA: Diagnosis not present

## 2020-12-11 DIAGNOSIS — E7800 Pure hypercholesterolemia, unspecified: Secondary | ICD-10-CM | POA: Diagnosis not present

## 2020-12-11 LAB — CBC
HCT: 32.7 % — ABNORMAL LOW (ref 36.0–46.0)
Hemoglobin: 10.6 g/dL — ABNORMAL LOW (ref 12.0–15.0)
MCH: 27.8 pg (ref 26.0–34.0)
MCHC: 32.4 g/dL (ref 30.0–36.0)
MCV: 85.8 fL (ref 80.0–100.0)
Platelets: 371 10*3/uL (ref 150–400)
RBC: 3.81 MIL/uL — ABNORMAL LOW (ref 3.87–5.11)
RDW: 14.2 % (ref 11.5–15.5)
WBC: 7.9 10*3/uL (ref 4.0–10.5)
nRBC: 0 % (ref 0.0–0.2)

## 2020-12-11 LAB — BASIC METABOLIC PANEL
Anion gap: 10 (ref 5–15)
BUN: 23 mg/dL (ref 8–23)
CO2: 27 mmol/L (ref 22–32)
Calcium: 9.1 mg/dL (ref 8.9–10.3)
Chloride: 97 mmol/L — ABNORMAL LOW (ref 98–111)
Creatinine, Ser: 1.07 mg/dL — ABNORMAL HIGH (ref 0.44–1.00)
GFR, Estimated: 51 mL/min — ABNORMAL LOW (ref >60)
Glucose, Bld: 101 mg/dL — ABNORMAL HIGH (ref 70–99)
Potassium: 3.5 mmol/L (ref 3.5–5.1)
Sodium: 134 mmol/L — ABNORMAL LOW (ref 135–145)

## 2020-12-11 MED ORDER — FUROSEMIDE 40 MG PO TABS: 40.0000 mg | ORAL_TABLET | Freq: Every day | ORAL | 2 refills | Status: DC

## 2020-12-11 MED ORDER — BISOPROLOL FUMARATE 5 MG PO TABS: 2.5000 mg | ORAL_TABLET | Freq: Every day | ORAL | 2 refills | Status: DC

## 2020-12-11 MED ORDER — POTASSIUM CHLORIDE CRYS ER 20 MEQ PO TBCR
40.0000 meq | EXTENDED_RELEASE_TABLET | Freq: Once | ORAL | Status: AC
  Administered 2020-12-11: 40 meq via ORAL
  Filled 2020-12-11: qty 2

## 2020-12-11 MED ORDER — POTASSIUM CHLORIDE CRYS ER 20 MEQ PO TBCR: 20.0000 meq | EXTENDED_RELEASE_TABLET | Freq: Once | ORAL | Status: DC

## 2020-12-11 MED FILL — Verapamil HCl IV Soln 2.5 MG/ML: INTRAVENOUS | Qty: 2 | Status: AC

## 2020-12-11 NOTE — Progress Notes (Signed)
D/C instructions given and reviewed. Tele and IV removed, tolerated well. Awaiting family to transport home around 5pm.

## 2020-12-11 NOTE — Discharge Instructions (Signed)
Do the following things EVERY DAY:  1) Weigh yourself EVERY morning after you go to the bathroom but before you eat or drink anything. Write this number down in a weight log/diary. If you gain 3 pounds overnight or 5 pounds in a week, call the office.  2) Take your medicines as prescribed. If you have concerns about your medications, please call us before you stop taking them.   3) Eat low salt foods--Limit salt (sodium) to 2000 mg per day. This will help prevent your body from holding onto fluid. Read food labels as many processed foods have a lot of sodium, especially canned goods and prepackaged meats. If you would like some assistance choosing low sodium foods, we would be happy to set you up with a nutritionist.  4) Stay as active as you can everyday. Staying active will give you more energy and make your muscles stronger. Start with 5 minutes at a time and work your way up to 30 minutes a day. Break up your activities--do some in the morning and some in the afternoon. Start with 3 days per week and work your way up to 5 days as you can.  If you have chest pain, feel short of breath, dizzy, or lightheaded, STOP. If you don't feel better after a short rest, call 911. If you do feel better, call the office to let us know you have symptoms with exercise.  5) Limit all fluids for the day to less than 2 liters. Fluid includes all drinks, coffee, juice, ice chips, soup, jello, and all other liquids.  Radial Site Care Refer to this sheet in the next few weeks. These instructions provide you with information on caring for yourself after your procedure. Your caregiver may also give you more specific instructions. Your treatment has been planned according to current medical practices, but problems sometimes occur. Call your caregiver if you have any problems or questions after your procedure. HOME CARE INSTRUCTIONS  You may shower the day after the procedure.Remove the bandage (dressing) and gently wash  the site with plain soap and water.Gently pat the site dry.   Do not apply powder or lotion to the site.   Do not submerge the affected site in water for 3 to 5 days.   Inspect the site at least twice daily.   Do not flex or bend the affected arm for 24 hours.   No lifting over 5 pounds (2.3 kg) for 5 days after your procedure.   Do not drive home if you are discharged the same day of the procedure. Have someone else drive you.   You may drive 24 hours after the procedure unless otherwise instructed by your caregiver.  What to expect:  Any bruising will usually fade within 1 to 2 weeks.   Blood that collects in the tissue (hematoma) may be painful to the touch. It should usually decrease in size and tenderness within 1 to 2 weeks.  SEEK IMMEDIATE MEDICAL CARE IF:  You have unusual pain at the radial site.   You have redness, warmth, swelling, or pain at the radial site.   You have drainage (other than a small amount of blood on the dressing).   You have chills.   You have a fever or persistent symptoms for more than 72 hours.   You have a fever and your symptoms suddenly get worse.   Your arm becomes pale, cool, tingly, or numb.   You have heavy bleeding from the site. Hold pressure  on the site.

## 2020-12-11 NOTE — Progress Notes (Signed)
Progress Note  Patient Name: Patricia Garcia Date of Encounter: 12/11/2020  [facility] Cardiologist: Freada Bergeron, MD   Subjective   Reviewed cath results from yesterday. Had some wrist site bleeding post procedure but now resolved. Reviewed medications, plans moving forward. Slept well with valium last night, used Xanax in the past with good result, recommended she discuss with her PCP if this is an option for her as an outpatient, discussed alternatives as well.  Inpatient Medications    Scheduled Meds: . aspirin  81 mg Oral Daily  . bisoprolol  2.5 mg Oral Daily  . clopidogrel  75 mg Oral Daily  . docusate sodium  100 mg Oral QODAY  . enoxaparin (LOVENOX) injection  30 mg Subcutaneous Q24H  . furosemide  40 mg Oral Daily  . latanoprost  1 drop Both Eyes QHS  . loratadine  10 mg Oral Daily  . sodium chloride flush  3 mL Intravenous Q12H  . sodium chloride flush  3 mL Intravenous Q12H   Continuous Infusions: . sodium chloride     PRN Meds: sodium chloride, acetaminophen, alum & mag hydroxide-simeth, diazepam, ondansetron (ZOFRAN) IV, prochlorperazine, sodium chloride flush   Vital Signs    Vitals:   12/10/20 1900 12/10/20 1958 12/11/20 0323 12/11/20 1144  BP: (!) 121/59 111/76 108/65 110/63  Pulse: 98 92 82 87  Resp: 18 16 16 18   Temp: (!) 97.4 F (36.3 C) 98 F (36.7 C) 98 F (36.7 C) 97.7 F (36.5 C)  TempSrc: Oral  Oral Oral  SpO2: 97% 94% 91% 98%  Weight:   53.8 kg   Height:        Intake/Output Summary (Last 24 hours) at 12/11/2020 1255 Last data filed at 12/11/2020 1146 Gross per 24 hour  Intake 456.82 ml  Output 1300 ml  Net -843.18 ml   Last 3 Weights 12/11/2020 12/10/2020 12/09/2020  Weight (lbs) 118 lb 9.7 oz 119 lb 3.2 oz 118 lb 14.4 oz  Weight (kg) 53.8 kg 54.069 kg 53.933 kg      Telemetry    Appears to be sinus with PVCs, though difficult to see P waves due to long PR, similar to prior - Personally Reviewed  ECG    No new  since 12/06/20 - Personally Reviewed  Physical Exam   GEN: No acute distress.   Neck: No JVD Cardiac: RRR, no murmurs, rubs, or gallops.  Respiratory: Clear to auscultation bilaterally GI: Soft, nontender, non-distended  MS: No edema; No deformity. Neuro:  Nonfocal  Psych: Normal affect   Labs    High Sensitivity Troponin:   Recent Labs  Lab 12/06/20 0521 12/06/20 0630  TROPONINIHS 15 16      Chemistry Recent Labs  Lab 12/06/20 0257 12/06/20 1547 12/09/20 0310 12/10/20 0511 12/10/20 1532 12/10/20 1533 12/10/20 1555 12/11/20 0313  NA 125*   < > 131* 132*   < > 135 129* 134*  K 3.5   < > 4.2 3.7   < > 3.3* 3.2* 3.5  CL 94*   < > 94* 96*  --   --   --  97*  CO2 19*   < > 25 26  --   --   --  27  GLUCOSE 175*   < > 118* 120*  --   --   --  101*  BUN 15   < > 25* 28*  --   --   --  23  CREATININE 0.86   < >  1.08* 1.15*  --   --   --  1.07*  CALCIUM 9.2   < > 9.0 8.9  --   --   --  9.1  PROT 6.6  --   --   --   --   --   --   --   ALBUMIN 3.4*  --   --   --   --   --   --   --   AST 45*  --   --   --   --   --   --   --   ALT 44  --   --   --   --   --   --   --   ALKPHOS 62  --   --   --   --   --   --   --   BILITOT 0.7  --   --   --   --   --   --   --   GFRNONAA >60   < > 50* 47*  --   --   --  51*  ANIONGAP 12   < > 12 10  --   --   --  10   < > = values in this interval not displayed.     Hematology Recent Labs  Lab 12/06/20 0257 12/10/20 0511 12/10/20 1532 12/10/20 1533 12/10/20 1555 12/11/20 0313  WBC 8.3 8.6  --   --   --  7.9  RBC 3.56* 3.74*  --   --   --  3.81*  HGB 10.0* 10.4*   < > 10.9* 10.5* 10.6*  HCT 30.0* 32.1*   < > 32.0* 31.0* 32.7*  MCV 84.3 85.8  --   --   --  85.8  MCH 28.1 27.8  --   --   --  27.8  MCHC 33.3 32.4  --   --   --  32.4  RDW 13.4 14.3  --   --   --  14.2  PLT 386 399  --   --   --  371   < > = values in this interval not displayed.    BNP Recent Labs  Lab 12/06/20 0520  BNP 1,701.2*     DDimer No results  for input(s): DDIMER in the last 168 hours.   Radiology    CARDIAC CATHETERIZATION  Result Date: 12/10/2020  Prox Cx to Mid Cx lesion is 85% stenosed.  Ramus lesion is 30% stenosed.  Previously placed 1st Diag stent (unknown type) is widely patent.  Mid LAD to Dist LAD lesion is 60% stenosed.  Mid LAD lesion is 40% stenosed.  Dist RCA lesion is 60% stenosed.  Mid RCA lesion is 30% stenosed.  Previously placed Prox RCA stent (unknown type) is widely patent.  Widely patent stents in the high diagonal and proximal RCA inserted in October 2021. The LAD has tortuosity in its mid segment with 40 and 60% narrowing.  This is not significantly changed from the prior angiogram. Mild disease in the ramus immediate vessel. Previously noted very small AV groove circumflex with diffuse narrowing of 80%. Dominant RCA with patent proximal stent and mild 30% mid stenosis with 50 to 60% narrowing before the PDA takeoff. Mild right heart pressure elevation pulmonary hypertension. RECOMMENDATION: Guideline directed medical therapy for heart failure with reduced EF on recent echo at 25 to 30%.  Medical therapy for concomitant CAD.    Cardiac Studies  Cath 12/10/20 Procedures  RIGHT/LEFT HEART CATH AND CORONARY ANGIOGRAPHY   Conclusion    Prox Cx to Mid Cx lesion is 85% stenosed.  Ramus lesion is 30% stenosed.  Previously placed 1st Diag stent (unknown type) is widely patent.  Mid LAD to Dist LAD lesion is 60% stenosed.  Mid LAD lesion is 40% stenosed.  Dist RCA lesion is 60% stenosed.  Mid RCA lesion is 30% stenosed.  Previously placed Prox RCA stent (unknown type) is widely patent.   Widely patent stents in the high diagonal and proximal RCA inserted in October 2021.  The LAD has tortuosity in its mid segment with 40 and 60% narrowing.  This is not significantly changed from the prior angiogram.  Mild disease in the ramus immediate vessel.  Previously noted very small AV groove  circumflex with diffuse narrowing of 80%.  Dominant RCA with patent proximal stent and mild 30% mid stenosis with 50 to 60% narrowing before the PDA takeoff.  Mild right heart pressure elevation pulmonary hypertension.  RECOMMENDATION: Guideline directed medical therapy for heart failure with reduced EF on recent echo at 25 to 30%.  Medical therapy for concomitant CAD.    Recommendations  Antiplatelet/Anticoag Recommend uninterrupted dual antiplatelet therapy with Aspirin 81mg  daily and Clopidogrel 75mg  daily.   Right Heart  Right Heart Pressures RA: A-wave 7, V wave 6; mean 5 RV: 37/5 PA: 42/19,mean  33 PW: A-wave 19, V wave 20, mean 16 Initial AO 107/57  Pullback: LV:  99/14 AO:  99/44  Oxygen saturation the pulmonary artery 73% and in the central aorta 99%. By the Fick method, cardiac output 5.5 L/min and cardiac index 3.6 L/min/m.  PVR: 3.1 WU   Coronary Diagrams   Diagnostic Dominance: Right       Echo 12/07/20 1. Left ventricular ejection fraction, by estimation, is 25 to 30%. The  left ventricle has severely decreased function. The left ventricle  demonstrates regional wall motion abnormalities.      There is severe hypokinesis of the basal-to-mid inferior, basal  inferolateral, and all septal segments. Septal motion is paradoxical due  to LBBB. There is mild asymmetric left ventricular hypertrophy of the  basal-septal segment. Indeterminate  diastolic filling due to E-A fusion.  2. Right ventricular systolic function is mildly reduced. The right  ventricular size is mildly enlarged. There is normal pulmonary artery  systolic pressure.  3. The mitral valve is normal in structure. Mild to moderate mitral valve  regurgitation. Moderate mitral annular calcification.  4. The aortic valve is tricuspid. There is mild calcification of the  aortic valve. There is mild thickening of the aortic valve. Aortic valve  regurgitation is trivial. Mild  aortic valve sclerosis is present, with no  evidence of aortic valve stenosis.  5. The inferior vena cava is dilated in size with >50% respiratory  variability, suggesting right atrial pressure of 8 mmHg.   Comparison(s): Compared to prior TTE in 04/2020, the LVEF is now severely  reduced (previously 50%) with elevated filling pressures.   Patient Profile     87 y.o. female with PMH CAD s/p prior PCI, hypertension, LBBB, chronic diastolic heart failure, admitted for acute on chronic heart failure and found to have new reduction in LVEF.  Assessment & Plan    Acute systolic heart failure Acute on chronic diastolic heart failure -new EF reduction to 25-30%, prior 50-55%. Regional wall motion abnormalities noted. -see prior notes this admission. Had stopped metoprolol, losartan, ezetimibe due to nonspecific side effects.  -  started bisoprolol this admission, tolerating well -BP borderline. No room for ARB this admission, would avoid ACEi given cough. Follow up as outpatient. -see cath, above. Recommended medical management -admission weight 55.8 kg, weight today 53.8 kg, charted net negative 3.9 L. LVEDP 14 yesterday, has had further urine output since then. -Cr 1.07 today, nadir this admission 1.00 -consider SGLT2i at outpatient follow up if renal function stable.  CAD s/p prior PCI -continue aspirin, clopidogrel  Hypercholesterolemia -LDL this admission 111, goal LDL <70 -reports intolerances to statins, side effects on ezetimibe. She declines to start medications this admission, follow up as outpatient.  Hyponatremia: 128 on admission, nadir 127, today improved to 134  Anemia, chronic: -Hgb 10 on admission, 10.6 today  [facility] will sign off.   Medication Recommendations:  Aspirin 81 mg daily Bisoprolol 2.5 mg daily Clopidogrel 75 mg daily Furosemide 40 mg daily Other recommendations (labs, testing, etc):  bmet Follow up as an outpatient:  She has follow up in our  [facility] on 12/21/20 and with Laurann [place] on 01/17/21  For questions or updates, please contact [place] Please consult [email or web address] for contact info under     Signed, Buford Dresser, MD  12/11/2020, 12:55 PM

## 2020-12-11 NOTE — Progress Notes (Signed)
Progress Note  Patient Name: Patricia Garcia Date of Encounter: 12/11/2020  [facility] Cardiologist: Freada Bergeron, MD   Subjective   Overall feels better and would like to go home.  No chest pain, was weak walking down the hall about 1 hour ago.  Always SOB and needs head up to sleep here.     Inpatient Medications    Scheduled Meds: . aspirin  81 mg Oral Daily  . bisoprolol  2.5 mg Oral Daily  . clopidogrel  75 mg Oral Daily  . docusate sodium  100 mg Oral QODAY  . enoxaparin (LOVENOX) injection  30 mg Subcutaneous Q24H  . furosemide  40 mg Oral Daily  . latanoprost  1 drop Both Eyes QHS  . loratadine  10 mg Oral Daily  . sodium chloride flush  3 mL Intravenous Q12H  . sodium chloride flush  3 mL Intravenous Q12H   Continuous Infusions: . sodium chloride     PRN Meds: sodium chloride, acetaminophen, alum & mag hydroxide-simeth, diazepam, ondansetron (ZOFRAN) IV, prochlorperazine, sodium chloride flush   Vital Signs    Vitals:   12/10/20 1820 12/10/20 1900 12/10/20 1958 12/11/20 0323  BP: 123/84 (!) 121/59 111/76 108/65  Pulse: 97 98 92 82  Resp: 18 18 16 16   Temp: (!) 97.4 F (36.3 C) (!) 97.4 F (36.3 C) 98 F (36.7 C) 98 F (36.7 C)  TempSrc: Oral Oral  Oral  SpO2: 96% 97% 94% 91%  Weight:    53.8 kg  Height:        Intake/Output Summary (Last 24 hours) at 12/11/2020 1052 Last data filed at 12/11/2020 0940 Gross per 24 hour  Intake 502.71 ml  Output 1100 ml  Net -597.29 ml   Last 3 Weights 12/11/2020 12/10/2020 12/09/2020  Weight (lbs) 118 lb 9.7 oz 119 lb 3.2 oz 118 lb 14.4 oz  Weight (kg) 53.8 kg 54.069 kg 53.933 kg      Telemetry    HR upper 90s, LBBB, appears Sinus but difficult to say with prolonged PR  - Personally Reviewed  ECG    No new - Personally Reviewed  Physical Exam   GEN: No acute distress.   Neck: No JVD sitting up in chair  Cardiac: RRR, no murmurs, rubs, or gallops. Rt wrist with bruising no  hematoma Respiratory: Clear to auscultation bilaterally. GI: Soft, nontender, non-distended  MS: No edema; No deformity. Neuro:  Nonfocal  Psych: Normal affect   Labs    High Sensitivity Troponin:   Recent Labs  Lab 12/06/20 0521 12/06/20 0630  TROPONINIHS 15 16      Chemistry Recent Labs  Lab 12/06/20 0257 12/06/20 1547 12/09/20 0310 12/10/20 0511 12/10/20 1532 12/10/20 1533 12/10/20 1555 12/11/20 0313  NA 125*   < > 131* 132*   < > 135 129* 134*  K 3.5   < > 4.2 3.7   < > 3.3* 3.2* 3.5  CL 94*   < > 94* 96*  --   --   --  97*  CO2 19*   < > 25 26  --   --   --  27  GLUCOSE 175*   < > 118* 120*  --   --   --  101*  BUN 15   < > 25* 28*  --   --   --  23  CREATININE 0.86   < > 1.08* 1.15*  --   --   --  1.07*  CALCIUM 9.2   < > 9.0 8.9  --   --   --  9.1  PROT 6.6  --   --   --   --   --   --   --   ALBUMIN 3.4*  --   --   --   --   --   --   --   AST 45*  --   --   --   --   --   --   --   ALT 44  --   --   --   --   --   --   --   ALKPHOS 62  --   --   --   --   --   --   --   BILITOT 0.7  --   --   --   --   --   --   --   GFRNONAA >60   < > 50* 47*  --   --   --  51*  ANIONGAP 12   < > 12 10  --   --   --  10   < > = values in this interval not displayed.     Hematology Recent Labs  Lab 12/06/20 0257 12/10/20 0511 12/10/20 1532 12/10/20 1533 12/10/20 1555 12/11/20 0313  WBC 8.3 8.6  --   --   --  7.9  RBC 3.56* 3.74*  --   --   --  3.81*  HGB 10.0* 10.4*   < > 10.9* 10.5* 10.6*  HCT 30.0* 32.1*   < > 32.0* 31.0* 32.7*  MCV 84.3 85.8  --   --   --  85.8  MCH 28.1 27.8  --   --   --  27.8  MCHC 33.3 32.4  --   --   --  32.4  RDW 13.4 14.3  --   --   --  14.2  PLT 386 399  --   --   --  371   < > = values in this interval not displayed.    BNP Recent Labs  Lab 12/06/20 0520  BNP 1,701.2*     DDimer No results for input(s): DDIMER in the last 168 hours.   Radiology    CARDIAC CATHETERIZATION  Result Date: 12/10/2020  Prox Cx to Mid Cx  lesion is 85% stenosed.  Ramus lesion is 30% stenosed.  Previously placed 1st Diag stent (unknown type) is widely patent.  Mid LAD to Dist LAD lesion is 60% stenosed.  Mid LAD lesion is 40% stenosed.  Dist RCA lesion is 60% stenosed.  Mid RCA lesion is 30% stenosed.  Previously placed Prox RCA stent (unknown type) is widely patent.  Widely patent stents in the high diagonal and proximal RCA inserted in October 2021. The LAD has tortuosity in its mid segment with 40 and 60% narrowing.  This is not significantly changed from the prior angiogram. Mild disease in the ramus immediate vessel. Previously noted very small AV groove circumflex with diffuse narrowing of 80%. Dominant RCA with patent proximal stent and mild 30% mid stenosis with 50 to 60% narrowing before the PDA takeoff. Mild right heart pressure elevation pulmonary hypertension. RECOMMENDATION: Guideline directed medical therapy for heart failure with reduced EF on recent echo at 25 to 30%.  Medical therapy for concomitant CAD.    Cardiac Studies   12/10/20  Rt and Lt cardiac cath   Prox Cx to  Mid Cx lesion is 85% stenosed.  Ramus lesion is 30% stenosed.  Previously placed 1st Diag stent (unknown type) is widely patent.  Mid LAD to Dist LAD lesion is 60% stenosed.  Mid LAD lesion is 40% stenosed.  Dist RCA lesion is 60% stenosed.  Mid RCA lesion is 30% stenosed.  Previously placed Prox RCA stent (unknown type) is widely patent.   Widely patent stents in the high diagonal and proximal RCA inserted in October 2021.  The LAD has tortuosity in its mid segment with 40 and 60% narrowing.  This is not significantly changed from the prior angiogram.  Mild disease in the ramus immediate vessel.  Previously noted very small AV groove circumflex with diffuse narrowing of 80%.  Dominant RCA with patent proximal stent and mild 30% mid stenosis with 50 to 60% narrowing before the PDA takeoff.  Mild right heart pressure  elevation pulmonary hypertension.  RECOMMENDATION: Guideline directed medical therapy for heart failure with reduced EF on recent echo at 25 to 30%.  Medical therapy for concomitant CAD.  TTE 12/07/20 IMPRESSIONS    1. Left ventricular ejection fraction, by estimation, is 25 to 30%. The  left ventricle has severely decreased function. The left ventricle  demonstrates regional wall motion abnormalities.      There is severe hypokinesis of the basal-to-mid inferior, basal  inferolateral, and all septal segments. Septal motion is paradoxical due  to LBBB. There is mild asymmetric left ventricular hypertrophy of the  basal-septal segment. Indeterminate  diastolic filling due to E-A fusion.  2. Right ventricular systolic function is mildly reduced. The right  ventricular size is mildly enlarged. There is normal pulmonary artery  systolic pressure.  3. The mitral valve is normal in structure. Mild to moderate mitral valve  regurgitation. Moderate mitral annular calcification.  4. The aortic valve is tricuspid. There is mild calcification of the  aortic valve. There is mild thickening of the aortic valve. Aortic valve  regurgitation is trivial. Mild aortic valve sclerosis is present, with no  evidence of aortic valve stenosis.  5. The inferior vena cava is dilated in size with >50% respiratory  variability, suggesting right atrial pressure of 8 mmHg.   Comparison(s): Compared to prior TTE in 04/2020, the LVEF is now severely  reduced (previously 50%) with elevated filling pressures.   FINDINGS  Left Ventricle: Left ventricular ejection fraction, by estimation, is 25  to 30%. Left ventricular ejection fraction by 3D volume is 29 %. The left  ventricle has severely decreased function. The left ventricle demonstrates  regional wall motion  abnormalities. There is severe hypokinesis of the basal-to-mid inferior,  basal inferolateral, and all septal segments. The average left  ventricular  global longitudinal strain is -8.4 %. The global longitudinal strain is  abnormal. The left ventricular  internal cavity size was normal in size. There is mild asymmetric left  ventricular hypertrophy of the basal-septal segment. Abnormal  (paradoxical) septal motion, consistent with left bundle branch block.  Indeterminate diastolic filling due to E-A fusion.   Right Ventricle: The right ventricular size is mildly enlarged. No  increase in right ventricular wall thickness. Right ventricular systolic  function is mildly reduced. There is normal pulmonary artery systolic  pressure. The tricuspid regurgitant velocity  is 2.60 m/s, and with an assumed right atrial pressure of 8 mmHg, the  estimated right ventricular systolic pressure is 16.0 mmHg.   Left Atrium: Left atrial size was normal in size.   Right Atrium: Right atrial size was  normal in size.   Pericardium: There is no evidence of pericardial effusion.   Mitral Valve: The mitral valve is normal in structure. There is mild  thickening of the mitral valve leaflet(s). There is mild calcification of  the mitral valve leaflet(s). Moderate mitral annular calcification. Mild  to moderate mitral valve  regurgitation. MV peak gradient, 10.1 mmHg. The mean mitral valve gradient  is 3.0 mmHg.   Tricuspid Valve: The tricuspid valve is normal in structure. Tricuspid  valve regurgitation is mild.   Aortic Valve: The aortic valve is tricuspid. There is mild calcification  of the aortic valve. There is mild thickening of the aortic valve. Aortic  valve regurgitation is trivial. Mild aortic valve sclerosis is present,  with no evidence of aortic valve  stenosis.   Patient Profile     86 y.o. female with PMH CAD s/p prior PCI, hypertension, LBBB, chronic diastolic heart failure, admitted for acute on chronic heart failure and found to have new reduction in LVEF. Cardiac cath with non obstructive disease.   Assessment & Plan     Acute systolic heart failure Acute on chronic diastolic heart failure -new EF reduction to 25-30%, prior 50-55%. Regional wall motion abnormalities noted. -see prior notes this admission. Had stopped metoprolol, losartan, ezetimibe due to nonspecific side effects.  -started bisoprolol this admission -BP borderline. Will start ARB this admission if BP tolerates. -cath yesterday with non obstructive disease manage medically +Mild right heart pressure elevation pulmonary hypertension. -admission weight 55.8 kg, weight today 53.8 kg, charted net negative 3.9 L -Cr up slightly to 1.15 yesterday and now at 1.07 nadir this admission 1.00  CAD s/p prior PCI -patent stent, stable coronary arteries -continue aspirin, clopidogrel  Hypercholesterolemia -LDL this admission 111, goal LDL <70 -reports intolerances to statins, side effects on ezetimibe --/ lipid clinic as outpt?  Hyponatremia: 128 on admission, nadir 127, today improved to 134 --K+ 3.5 was given 40 meq K+ this AM   Anemia, chronic: -Hgb 10 on admission, 10.6 today     For questions or updates, please contact [place] Please consult [email or web address] for contact info under        Signed, Cecilie Kicks, NP  12/11/2020, 10:52 AM

## 2020-12-11 NOTE — Plan of Care (Signed)
  Problem: Clinical Measurements: Goal: Ability to maintain clinical measurements within normal limits will improve Outcome: Progressing Goal: Will remain free from infection Outcome: Progressing Goal: Diagnostic test results will improve Outcome: Progressing Goal: Respiratory complications will improve Outcome: Progressing Goal: Cardiovascular complication will be avoided Outcome: Progressing   Problem: Activity: Goal: Risk for activity intolerance will decrease Outcome: Progressing   Problem: Coping: Goal: Level of anxiety will decrease Outcome: Progressing   Problem: Elimination: Goal: Will not experience complications related to bowel motility Outcome: Progressing   Problem: Cardiac: Goal: Ability to achieve and maintain adequate cardiopulmonary perfusion will improve Outcome: Progressing

## 2020-12-11 NOTE — Care Management Important Message (Signed)
Important Message  Patient Details  Name: Patricia Garcia MRN: [id number] Date of Birth: February 13, 1933   Medicare Important Message Given:  Yes     Shelda Altes 12/11/2020, 9:35 AM

## 2020-12-11 NOTE — Progress Notes (Signed)
Heart Failure Stewardship Pharmacist Progress Note   PCP: Lajean Manes, MD PCP-Cardiologist: Freada Bergeron, MD    HPI:  87 yo F with PMH of CAD s/p PCI, HTN, LBBB, and CHF. She presented to the ED on 12/06/20 with nausea, vomiting, and shortness of breath. CXR showed small bilateral effusions that appear increased since October with continued diffuse pulmonary interstitial opacity suspicious for edema. BNP elevated 1701. Hs Trop negative x2. She was then admitted for CHF exacerbation. An ECHO was done on 12/07/20 and LVEF is now reduced to 25-30% (was 50-55% in Oct 2021) and mildly reduced RV systolic function. R/LHC done on 5/23 and recommended GDMT for HFrEF and medical management for concomitant CAD.  Discharge HF Medications: Furosemide 40 mg daily Bisoprolol 2.5 mg daily  Prior to admission HF Medications: None  Pertinent Lab Values: . Serum creatinine 1.07, BUN 23, Potassium 3.5, Sodium 134, BNP 1701.2   Vital Signs: . Weight: 118 lbs (admission weight: 123 lbs) . Blood pressure: 110/60s  . Heart rate: 90s   Medication Assistance / Insurance Benefits Check: Does the patient have prescription insurance?  Yes Type of insurance plan: Medicare + BCBS supplement  Outpatient Pharmacy:  Prior to admission outpatient pharmacy: [facility] Is the patient willing to use [place] pharmacy at discharge? No Is the patient willing to transition their outpatient pharmacy to utilize a [facility] outpatient pharmacy?   No    Assessment/Plan: 1. Acute on chronic systolic CHF (EF 49-17%), due to ICM. NYHA class II symptoms. - Continue furosemide 40 mg daily - Continue bisoprolol 2.5 mg daily - Consider starting low dose ARB at follow up  2) Patient assistance: - None pending  3)  Education  - Patient has been educated on current HF medications and potential additions to HF medication regimen  - Patient verbalizes understanding that over the next few months, these  medication doses may change and more medications may be added to optimize HF regimen - Patient has been educated on basic disease state pathophysiology and goals of therapy   Kerby Nora, PharmD, BCPS Heart Failure Stewardship Pharmacist Phone [phone]

## 2020-12-11 NOTE — Discharge Summary (Signed)
Physician Discharge Summary  Patricia Garcia WSF:[id number] DOB: 06/02/1933 DOA: 12/06/2020  PCP: Patricia Manes, MD  Admit date: 12/06/2020 Discharge date: 12/11/2020  Admitted From: Home Disposition:  Home  Recommendations for Outpatient Follow-up:  1. Follow up with PCP in 1 week 2. Follow-up with cardiology as scheduled on 6/3 and 6/30 3. Follow-up BMP as an outpatient  Discharge Condition: Stable CODE STATUS: Full code Diet recommendation: Heart healthy  Brief/Interim Summary: Patricia Garcia a 87 y.o.femalewith medical history significant ofhypertension, CADs/pDES x210/2021,diastolic CHF, depression, and anemia presents with complaints of nausea and vomiting. She has been dealing with sinus congestion and drainage over the last 2 weeks. Noted associated symptoms of sinus pressure, shortness of breath and productive cough. She was seen at urgent care on 5/14, and was diagnosed with a acute sinusitis with bronchitis prescribe doxycycline 1 tablet twice daily. However, after getting home patient reported that every time that she took the doxycycline may cause her stomach to be upset her to have nausea and vomiting. She continue to try and take the medication for the next 4 days. However her symptoms of nausea and vomiting were not getting better. Patient reported inability to lay flat at night due to feeling short of breath and was using 2 pillows to prop her self up. They had attempted to switch her to cephalexin yesterday, but because of her progressive worsening shortness of breath came to the hospital for further evaluation. Denies having any significant fever, abdominal pain, diarrhea, or leg swelling.Normally she does not require oxygen at baseline.  Chest x-ray was significant for small bilateral pleural effusions increased since October with diffuse interstitial opacity suspicious for edema and stable 8 mm right renal calculus. Patient was given full dose aspirin,  Zofran, and furosemide 40 mg IV. Cardiology consulted for acute on chronic diastolic HF.  Patient was diuresed with IV Lasix.  She underwent heart cath on 5/23, medical management recommended for reduced EF.  Patient continued to feel well, without chest pain or shortness of breath, on room air, without peripheral edema.  Patient was discharged home in stable condition.  Discharge Diagnoses:  Principal Problem:   Respiratory failure with hypoxia ([place]) Active Problems:   GERD   CAD (coronary artery disease)   Nausea and vomiting   Prolonged QT interval   Hyponatremia   Nephrolithiasis   Acute combined systolic and diastolic heart failure (HCC)   Acute on chronic systolic and diastolic heart failure -BNP 1701.2 -Strict I's and O's, daily weight, fluid restriction diet -Repeat echocardiogram showed EF 25 to 30%, with regional wall motion abnormality, severely reduced EF compared to previous in 2021 -Status post heart cath on 5/23, recommended guideline directed medical therapy for heart failure with reduced EF -Cardiology following -Continue PO lasix, bisoprolol  Hyponatremia -Hypervolemic -Improved  CKD stage 3a -Stable   CAD -Continue aspirin, Plavix  Nausea and vomiting -Resolved   Discharge Instructions  Discharge Instructions    (HEART FAILURE PATIENTS) Call MD:  Anytime you have any of the following symptoms: 1) 3 pound weight gain in 24 hours or 5 pounds in 1 week 2) shortness of breath, with or without a dry hacking cough 3) swelling in the hands, feet or stomach 4) if you have to sleep on extra pillows at night in order to breathe.   Complete by: As directed    Call MD for:  difficulty breathing, headache or visual disturbances   Complete by: As directed    Call MD for:  extreme  fatigue   Complete by: As directed    Call MD for:  persistant dizziness or light-headedness   Complete by: As directed    Call MD for:  persistant nausea and vomiting   Complete by:  As directed    Call MD for:  severe uncontrolled pain   Complete by: As directed    Call MD for:  temperature >100.4   Complete by: As directed    Diet - low sodium heart healthy   Complete by: As directed    Discharge instructions   Complete by: As directed    You were cared for by a hospitalist during your hospital stay. If you have any questions about your discharge medications or the care you received while you were in the hospital after you are discharged, you can call the unit and ask to speak with the hospitalist on call if the hospitalist that took care of you is not available. Once you are discharged, your primary care physician will handle any further medical issues. Please note that NO REFILLS for any discharge medications will be authorized once you are discharged, as it is imperative that you return to your primary care physician (or establish a relationship with a primary care physician if you do not have one) for your aftercare needs so that they can reassess your need for medications and monitor your lab values.   Increase activity slowly   Complete by: As directed      Allergies as of 12/11/2020      Reactions   Doxycycline Nausea And Vomiting   12/04/20 Pt prefers not to receive Doxycycline for infections   Penicillins Other (See Comments)   unknown   Prednisone Other (See Comments)   unknown   Statins Other (See Comments)   shaking   Sulfonamide Derivatives Other (See Comments)   unknown   Tramadol Other (See Comments)   unknown      Medication List    STOP taking these medications   benzonatate 200 MG capsule Commonly known as: TESSALON   cefdinir 300 MG capsule Commonly known as: OMNICEF   dextromethorphan-guaiFENesin 30-600 MG 12hr tablet Commonly known as: MUCINEX DM   doxycycline 100 MG capsule Commonly known as: VIBRAMYCIN     TAKE these medications   albuterol 108 (90 Base) MCG/ACT inhaler Commonly known as: VENTOLIN HFA Inhale 1-2 puffs into  the lungs every 6 (six) hours as needed for wheezing or shortness of breath.   aspirin 81 MG chewable tablet Chew 81 mg by mouth daily.   bisoprolol 5 MG tablet Commonly known as: ZEBETA Take 0.5 tablets (2.5 mg total) by mouth daily. Start taking on: Dec 12, 2020   CALCIUM 1200 PO Take 1,200 mg by mouth daily.   cholecalciferol 1000 units tablet Commonly known as: VITAMIN D Take 1,000 Units by mouth daily.   clopidogrel 75 MG tablet Commonly known as: PLAVIX Take 1 tablet (75 mg total) by mouth daily.   docusate sodium 100 MG capsule Commonly known as: COLACE Take 100 mg by mouth every other day.   esomeprazole 40 MG capsule Commonly known as: NEXIUM Take 1 capsule (40 mg total) by mouth daily.   ferrous sulfate 325 (65 FE) MG tablet Take 325 mg by mouth See admin instructions. 3 times a week   furosemide 40 MG tablet Commonly known as: LASIX Take 1 tablet (40 mg total) by mouth daily. Start taking on: Dec 12, 2020   latanoprost 0.005 % ophthalmic solution Commonly known as:  XALATAN Place 1 drop into both eyes at bedtime.   multivitamin-iron-minerals-folic acid chewable tablet Chew 1 tablet by mouth daily.       Follow-up Information    Loel Dubonnet, NP Follow up on 01/17/2021.   Specialty: Cardiology Why: at 8:45 AM - this is Dr. Kathryne Hitch Nurse Practitioner. Contact information: [address] [place] [address] [phone]              Allergies  Allergen Reactions  . Doxycycline Nausea And Vomiting    12/04/20 Pt prefers not to receive Doxycycline for infections  . Penicillins Other (See Comments)    unknown  . Prednisone Other (See Comments)    unknown  . Statins Other (See Comments)    shaking  . Sulfonamide Derivatives Other (See Comments)    unknown  . Tramadol Other (See Comments)    unknown    Consultations:  Cardiology   Procedures/Studies: CARDIAC CATHETERIZATION  Result Date: 12/10/2020   Prox Cx to Mid Cx lesion is 85% stenosed.  Ramus lesion is 30% stenosed.  Previously placed 1st Diag stent (unknown type) is widely patent.  Mid LAD to Dist LAD lesion is 60% stenosed.  Mid LAD lesion is 40% stenosed.  Dist RCA lesion is 60% stenosed.  Mid RCA lesion is 30% stenosed.  Previously placed Prox RCA stent (unknown type) is widely patent.  Widely patent stents in the high diagonal and proximal RCA inserted in October 2021. The LAD has tortuosity in its mid segment with 40 and 60% narrowing.  This is not significantly changed from the prior angiogram. Mild disease in the ramus immediate vessel. Previously noted very small AV groove circumflex with diffuse narrowing of 80%. Dominant RCA with patent proximal stent and mild 30% mid stenosis with 50 to 60% narrowing before the PDA takeoff. Mild right heart pressure elevation pulmonary hypertension. RECOMMENDATION: Guideline directed medical therapy for heart failure with reduced EF on recent echo at 25 to 30%.  Medical therapy for concomitant CAD.   DG Abdomen Acute W/Chest  Result Date: 12/06/2020 CLINICAL DATA:  87 year old female with nausea and vomiting. EXAM: DG ABDOMEN ACUTE WITH 1 VIEW CHEST COMPARISON:  CT Abdomen and Pelvis 05/06/2020 and earlier. FINDINGS: AP view of the chest. Small but increased appearance of bilateral pleural effusions since October. Similar mild diffuse increased pulmonary interstitium since that time. Stable cardiac size and mediastinal contours. Heart size at the upper limits of normal. Visualized tracheal air column is within normal limits. No pneumothorax. No air bronchograms. AP portable upright and supine views of the abdomen and pelvis. No pneumoperitoneum. Nonobstructed bowel-gas pattern similar to the prior CT. Chronic bulky 8 mm right nephrolithiasis again noted and appears stable. Other abdominal and pelvic visceral contours are stable. Incidental pelvic phleboliths. Chronic right hip arthroplasty, lumbar  spine degeneration and levoconvex scoliosis. No acute osseous abnormality identified. IMPRESSION: 1. Small bilateral effusions appear increased since October with continued diffuse pulmonary interstitial opacity suspicious for edema. 2. Nonobstructed bowel-gas pattern, no free air. 3. 8 mm right renal calculus, previously involving the right renal pelvis, stable since October. Electronically Signed   By: Genevie Ann M.D.   On: 12/06/2020 04:29   ECHOCARDIOGRAM COMPLETE  Result Date: 12/07/2020    ECHOCARDIOGRAM REPORT   Patient Name:   Patricia Garcia Date of Exam: 12/07/2020 Medical Rec #:  [id number]        Height:       61.0 in Accession #:    [id number]  Weight:       121.4 lb Date of Birth:  07/02/1933        BSA:          1.528 m Patient Age:    67 years         BP:           111/73 mmHg Patient Gender: F                HR:           101 bpm. Exam Location:  Inpatient Procedure: 2D Echo, 3D Echo, Cardiac Doppler, Color Doppler and Strain Analysis Indications:    I50.40* Unspecified combined systolic (congestive) and diastolic                 (congestive) heart failure  History:        Patient has prior history of Echocardiogram examinations, most                 recent 05/07/2020. Cardiomyopathy, Previous Myocardial                 Infarction, Abnormal ECG, Signs/Symptoms:Dyspnea and Shortness                 of Breath; Risk Factors:Hypertension and Dyslipidemia. Hypoxia.  Sonographer:    Roseanna Rainbow RDCS Referring Phys: [id number] RONDELL A SMITH  Sonographer Comments: Global longitudinal strain was attempted. IMPRESSIONS  1. Left ventricular ejection fraction, by estimation, is 25 to 30%. The left ventricle has severely decreased function. The left ventricle demonstrates regional wall motion abnormalities.         There is severe hypokinesis of the basal-to-mid inferior, basal inferolateral, and all septal segments. Septal motion is paradoxical due to LBBB. There is mild asymmetric left ventricular  hypertrophy of the basal-septal segment. Indeterminate diastolic filling due to E-A fusion.  2. Right ventricular systolic function is mildly reduced. The right ventricular size is mildly enlarged. There is normal pulmonary artery systolic pressure.  3. The mitral valve is normal in structure. Mild to moderate mitral valve regurgitation. Moderate mitral annular calcification.  4. The aortic valve is tricuspid. There is mild calcification of the aortic valve. There is mild thickening of the aortic valve. Aortic valve regurgitation is trivial. Mild aortic valve sclerosis is present, with no evidence of aortic valve stenosis.  5. The inferior vena cava is dilated in size with >50% respiratory variability, suggesting right atrial pressure of 8 mmHg. Comparison(s): Compared to prior TTE in 04/2020, the LVEF is now severely reduced (previously 50%) with elevated filling pressures. FINDINGS  Left Ventricle: Left ventricular ejection fraction, by estimation, is 25 to 30%. Left ventricular ejection fraction by 3D volume is 29 %. The left ventricle has severely decreased function. The left ventricle demonstrates regional wall motion abnormalities. There is severe hypokinesis of the basal-to-mid inferior, basal inferolateral, and all septal segments. The average left ventricular global longitudinal strain is -8.4 %. The global longitudinal strain is abnormal. The left ventricular internal cavity size was normal in size. There is mild asymmetric left ventricular hypertrophy of the basal-septal segment. Abnormal (paradoxical) septal motion, consistent with left bundle branch block. Indeterminate diastolic filling due to E-A fusion. Right Ventricle: The right ventricular size is mildly enlarged. No increase in right ventricular wall thickness. Right ventricular systolic function is mildly reduced. There is normal pulmonary artery systolic pressure. The tricuspid regurgitant velocity  is 2.60 m/s, and with an assumed right atrial  pressure of 8 mmHg, the estimated  right ventricular systolic pressure is 16.0 mmHg. Left Atrium: Left atrial size was normal in size. Right Atrium: Right atrial size was normal in size. Pericardium: There is no evidence of pericardial effusion. Mitral Valve: The mitral valve is normal in structure. There is mild thickening of the mitral valve leaflet(s). There is mild calcification of the mitral valve leaflet(s). Moderate mitral annular calcification. Mild to moderate mitral valve regurgitation. MV peak gradient, 10.1 mmHg. The mean mitral valve gradient is 3.0 mmHg. Tricuspid Valve: The tricuspid valve is normal in structure. Tricuspid valve regurgitation is mild. Aortic Valve: The aortic valve is tricuspid. There is mild calcification of the aortic valve. There is mild thickening of the aortic valve. Aortic valve regurgitation is trivial. Mild aortic valve sclerosis is present, with no evidence of aortic valve stenosis. Pulmonic Valve: The pulmonic valve was normal in structure. Pulmonic valve regurgitation is trivial. Aorta: The aortic root and ascending aorta are structurally normal, with no evidence of dilitation. Venous: The inferior vena cava is dilated in size with greater than 50% respiratory variability, suggesting right atrial pressure of 8 mmHg. IAS/Shunts: There is right bowing of the interatrial septum, suggestive of elevated left atrial pressure. No atrial level shunt detected by color flow Doppler. Additional Comments: There is a small pleural effusion in the left lateral region.  LEFT VENTRICLE PLAX 2D LVIDd:         4.85 cm         Diastology LVIDs:         4.70 cm         LV e' medial:    9.14 cm/s LV PW:         1.20 cm         LV E/e' medial:  13.6 LV IVS:        1.40 cm         LV e' lateral:   10.10 cm/s LVOT diam:     1.60 cm         LV E/e' lateral: 12.3 LV SV:         31 LV SV Index:   20              2D LVOT Area:     2.01 cm        Longitudinal                                Strain                                 2D Strain GLS  -7.2 % LV Volumes (MOD)               (A2C): LV vol d, MOD    95.3 ml       2D Strain GLS  -7.7 % A2C:                           (A3C): LV vol d, MOD    91.0 ml       2D Strain GLS  -10.4 % A4C:                           (A4C): LV vol s, MOD    68.0 ml       2D Strain GLS  -  8.4 % A2C:                           Avg: LV vol s, MOD    67.9 ml A4C:                           3D Volume EF LV SV MOD A2C:   27.3 ml       LV 3D EF:    Left LV SV MOD A4C:   91.0 ml                    ventricular LV SV MOD BP:    28.3 ml                    ejection                                             fraction by                                             3D volume                                             is 29 %.                                 3D Volume EF:                                3D EF:        29 % RIGHT VENTRICLE            IVC RV S prime:     9.46 cm/s  IVC diam: 1.80 cm TAPSE (M-mode): 1.4 cm LEFT ATRIUM             Index       RIGHT ATRIUM           Index LA diam:        3.40 cm 2.23 cm/m  RA Area:     13.60 cm LA Vol (A2C):   36.2 ml 23.70 ml/m RA Volume:   39.00 ml  25.53 ml/m LA Vol (A4C):   33.9 ml 22.19 ml/m LA Biplane Vol: 36.0 ml 23.57 ml/m  AORTIC VALVE             PULMONIC VALVE LVOT Vmax:   89.50 cm/s  PR End Diast Vel: 1.60 msec LVOT Vmean:  65.300 cm/s LVOT VTI:    0.155 m  AORTA Ao Root diam: 3.00 cm Ao Asc diam:  3.50 cm MITRAL VALVE                TRICUSPID VALVE MV Area (PHT): 5.50 cm     TR Peak grad:   27.0 mmHg MV Area VTI:   1.57 cm     TR Vmax:        260.00 cm/s MV Peak grad:  10.1 mmHg MV Mean grad:  3.0 mmHg     SHUNTS MV Vmax:       1.59 m/s     Systemic VTI:  0.16 m MV Vmean:      72.1 cm/s    Systemic Diam: 1.60 cm MV Decel Time: 138 msec MR Peak grad: 66.9 mmHg MR Mean grad: 34.0 mmHg MR Vmax:      409.00 cm/s MR Vmean:     256.0 cm/s MV E velocity: 124.00 cm/s Gwyndolyn Kaufman MD Electronically signed by Gwyndolyn Kaufman MD Signature  Date/Time: 12/07/2020/2:09:52 PM    Final        Discharge Exam: Vitals:   12/11/20 0323 12/11/20 1144  BP: 108/65 110/63  Pulse: 82 87  Resp: 16 18  Temp: 98 F (36.7 C) 97.7 F (36.5 C)  SpO2: 91% 98%    General: Pt is alert, awake, not in acute distress Cardiovascular: RRR, S1/S2 +, no edema Respiratory: CTA bilaterally, no wheezing, no rhonchi, no respiratory distress, no conversational dyspnea on room air Abdominal: Soft, NT, ND, bowel sounds + Extremities: no edema, no cyanosis Psych: Normal mood and affect, stable judgement and insight     The results of significant diagnostics from this hospitalization (including imaging, microbiology, ancillary and laboratory) are listed below for reference.     Microbiology: Recent Results (from the past 240 hour(s))  Resp Panel by RT-PCR (Flu A&B, Covid) Nasopharyngeal Swab     Status: None   Collection Time: 12/06/20  4:36 AM   Specimen: Nasopharyngeal Swab; Nasopharyngeal(NP) swabs in vial transport medium  Result Value Ref Range Status   SARS Coronavirus 2 by RT PCR NEGATIVE NEGATIVE Final    Comment: (NOTE) SARS-CoV-2 target nucleic acids are NOT DETECTED.  The SARS-CoV-2 RNA is generally detectable in upper respiratory specimens during the acute phase of infection. The lowest concentration of SARS-CoV-2 viral copies this assay can detect is 138 copies/mL. A negative result does not preclude SARS-Cov-2 infection and should not be used as the sole basis for treatment or other patient management decisions. A negative result may occur with  improper specimen collection/handling, submission of specimen other than nasopharyngeal swab, presence of viral mutation(s) within the areas targeted by this assay, and inadequate number of viral copies(<138 copies/mL). A negative result must be combined with clinical observations, patient history, and epidemiological information. The expected result is Negative.  Fact Sheet for  Patients:  EntrepreneurPulse.com.au  Fact Sheet for Healthcare Providers:  IncredibleEmployment.be  This test is no t yet approved or cleared by the [place] FDA and  has been authorized for detection and/or diagnosis of SARS-CoV-2 by FDA under an Emergency Use Authorization (EUA). This EUA will remain  in effect (meaning this test can be used) for the duration of the COVID-19 declaration under Section 564(b)(1) of the Act, 21 U.S.C.section 360bbb-3(b)(1), unless the authorization is terminated  or revoked sooner.       Influenza A by PCR NEGATIVE NEGATIVE Final   Influenza B by PCR NEGATIVE NEGATIVE Final    Comment: (NOTE) The Xpert Xpress SARS-CoV-2/FLU/RSV plus assay is intended as an aid in the diagnosis of influenza from Nasopharyngeal swab specimens and should not be used as a sole basis for treatment. Nasal washings and aspirates are unacceptable for Xpert Xpress SARS-CoV-2/FLU/RSV testing.  Fact Sheet for Patients: EntrepreneurPulse.com.au  Fact Sheet for Healthcare Providers: IncredibleEmployment.be  This test is not yet approved or cleared by the [place] FDA and has been authorized for detection and/or diagnosis  of SARS-CoV-2 by FDA under an Emergency Use Authorization (EUA). This EUA will remain in effect (meaning this test can be used) for the duration of the COVID-19 declaration under Section 564(b)(1) of the Act, 21 U.S.C. section 360bbb-3(b)(1), unless the authorization is terminated or revoked.  Performed at [facility], [place] [address] [place], [place] [address]      Labs: BNP (last 3 results) Recent Labs    05/06/20 1413 12/06/20 0520  BNP 1,221.6* 5,681.2*   Basic Metabolic Panel: Recent Labs  Lab 12/06/20 0630 12/06/20 1547 12/07/20 0504 12/08/20 0238 12/09/20 0310 12/10/20 0511 12/10/20 1532 12/10/20 1533 12/10/20 1555 12/11/20 0313  NA  --     < > 127* 132* 131* 132* 134* 135 129* 134*  K  --   --  3.8 4.0 4.2 3.7 3.3* 3.3* 3.2* 3.5  CL  --   --  94* 93* 94* 96*  --   --   --  97*  CO2  --   --  25 31 25 26   --   --   --  27  GLUCOSE  --   --  103* 98 118* 120*  --   --   --  101*  BUN  --   --  21 27* 25* 28*  --   --   --  23  CREATININE  --   --  1.05* 1.00 1.08* 1.15*  --   --   --  1.07*  CALCIUM  --   --  8.8* 9.1 9.0 8.9  --   --   --  9.1  MG 1.7  --  1.8  --   --   --   --   --   --   --    < > = values in this interval not displayed.   Liver Function Tests: Recent Labs  Lab 12/06/20 0257  AST 45*  ALT 44  ALKPHOS 62  BILITOT 0.7  PROT 6.6  ALBUMIN 3.4*   Recent Labs  Lab 12/06/20 0257  LIPASE 34   No results for input(s): AMMONIA in the last 168 hours. CBC: Recent Labs  Lab 12/06/20 0257 12/10/20 0511 12/10/20 1532 12/10/20 1533 12/10/20 1555 12/11/20 0313  WBC 8.3 8.6  --   --   --  7.9  HGB 10.0* 10.4* 11.2* 10.9* 10.5* 10.6*  HCT 30.0* 32.1* 33.0* 32.0* 31.0* 32.7*  MCV 84.3 85.8  --   --   --  85.8  PLT 386 399  --   --   --  371   Cardiac Enzymes: No results for input(s): CKTOTAL, CKMB, CKMBINDEX, TROPONINI in the last 168 hours. BNP: Invalid input(s): POCBNP CBG: No results for input(s): GLUCAP in the last 168 hours. D-Dimer No results for input(s): DDIMER in the last 72 hours. Hgb A1c No results for input(s): HGBA1C in the last 72 hours. Lipid Profile Recent Labs    12/10/20 0511  CHOL 180  HDL 40*  LDLCALC 111*  TRIG 147  CHOLHDL 4.5   Thyroid function studies No results for input(s): TSH, T4TOTAL, T3FREE, THYROIDAB in the last 72 hours.  Invalid input(s): FREET3 Anemia work up No results for input(s): VITAMINB12, FOLATE, FERRITIN, TIBC, IRON, RETICCTPCT in the last 72 hours. Urinalysis    Component Value Date/Time   COLORURINE YELLOW 12/06/2020 0353   APPEARANCEUR CLEAR 12/06/2020 0353   LABSPEC >1.030 (H) 12/06/2020 0353   PHURINE 5.0 12/06/2020 0353   GLUCOSEU  NEGATIVE  12/06/2020 0353   GLUCOSEU NEGATIVE 06/25/2010 1249   HGBUR MODERATE (A) 12/06/2020 0353   BILIRUBINUR NEGATIVE 12/06/2020 0353   KETONESUR NEGATIVE 12/06/2020 0353   PROTEINUR 100 (A) 12/06/2020 0353   UROBILINOGEN 0.2 06/25/2010 1249   NITRITE NEGATIVE 12/06/2020 0353   LEUKOCYTESUR NEGATIVE 12/06/2020 0353   Sepsis Labs Invalid input(s): PROCALCITONIN,  WBC,  LACTICIDVEN Microbiology Recent Results (from the past 240 hour(s))  Resp Panel by RT-PCR (Flu A&B, Covid) Nasopharyngeal Swab     Status: None   Collection Time: 12/06/20  4:36 AM   Specimen: Nasopharyngeal Swab; Nasopharyngeal(NP) swabs in vial transport medium  Result Value Ref Range Status   SARS Coronavirus 2 by RT PCR NEGATIVE NEGATIVE Final    Comment: (NOTE) SARS-CoV-2 target nucleic acids are NOT DETECTED.  The SARS-CoV-2 RNA is generally detectable in upper respiratory specimens during the acute phase of infection. The lowest concentration of SARS-CoV-2 viral copies this assay can detect is 138 copies/mL. A negative result does not preclude SARS-Cov-2 infection and should not be used as the sole basis for treatment or other patient management decisions. A negative result may occur with  improper specimen collection/handling, submission of specimen other than nasopharyngeal swab, presence of viral mutation(s) within the areas targeted by this assay, and inadequate number of viral copies(<138 copies/mL). A negative result must be combined with clinical observations, patient history, and epidemiological information. The expected result is Negative.  Fact Sheet for Patients:  EntrepreneurPulse.com.au  Fact Sheet for Healthcare Providers:  IncredibleEmployment.be  This test is no t yet approved or cleared by the [place] FDA and  has been authorized for detection and/or diagnosis of SARS-CoV-2 by FDA under an Emergency Use Authorization (EUA). This EUA will  remain  in effect (meaning this test can be used) for the duration of the COVID-19 declaration under Section 564(b)(1) of the Act, 21 U.S.C.section 360bbb-3(b)(1), unless the authorization is terminated  or revoked sooner.       Influenza A by PCR NEGATIVE NEGATIVE Final   Influenza B by PCR NEGATIVE NEGATIVE Final    Comment: (NOTE) The Xpert Xpress SARS-CoV-2/FLU/RSV plus assay is intended as an aid in the diagnosis of influenza from Nasopharyngeal swab specimens and should not be used as a sole basis for treatment. Nasal washings and aspirates are unacceptable for Xpert Xpress SARS-CoV-2/FLU/RSV testing.  Fact Sheet for Patients: EntrepreneurPulse.com.au  Fact Sheet for Healthcare Providers: IncredibleEmployment.be  This test is not yet approved or cleared by the [place] FDA and has been authorized for detection and/or diagnosis of SARS-CoV-2 by FDA under an Emergency Use Authorization (EUA). This EUA will remain in effect (meaning this test can be used) for the duration of the COVID-19 declaration under Section 564(b)(1) of the Act, 21 U.S.C. section 360bbb-3(b)(1), unless the authorization is terminated or revoked.  Performed at [facility], [place] [address], [place], [place] [address]      Patient was seen and examined on the day of discharge and was found to be in stable condition. Time coordinating discharge: 25 minutes including assessment and coordination of care, as well as examination of the patient.   SIGNED:  Dessa Phi, DO [facility] 12/11/2020, 1:01 PM

## 2020-12-11 NOTE — Progress Notes (Signed)
Physical Therapy Treatment Patient Details Name: Patricia Garcia MRN: [id number] DOB: [birth year] Today's Date: 12/11/2020    History of Present Illness pt is an 87 y/o female presenting 5/19 with n/v and sinus congestion.  pt found to have diastolic CHF. s/p cardiac cath 12/10/20.  PMHx HTN, CAD with NSTEMI, diastolic CHF    PT Comments    Patient progressing well s/p cardiac cath. Reports feeling tired. Tolerated transfers and ambulation with Mod I-supervision for safety. Noted to have 2/4 DOE, very slow gait speed and needed 1 standing rest break due to fatigue. HR up to 107 bpm max with activity and Sp02 >94% on RA. Discussed energy conservation and slowly increasing activity to improve overall endurance. Will plan for stair training next session as pt too fatigued today. Will follow.    Follow Up Recommendations  Other (comment) (outpatient cardiopulmonary rehab)     Equipment Recommendations  None recommended by PT    Recommendations for Other Services       Precautions / Restrictions Precautions Precautions: None Restrictions Weight Bearing Restrictions: No    Mobility  Bed Mobility               General bed mobility comments: in chair on arrival    Transfers Overall transfer level: Needs assistance Equipment used: None Transfers: Sit to/from Stand Sit to Stand: Modified independent (Device/Increase time)         General transfer comment: Stood from chair x1, no difficulties  Ambulation/Gait Ambulation/Gait assistance: Supervision Gait Distance (Feet): 350 Feet Assistive device: None (rail in hallway at times) Gait Pattern/deviations: Step-through pattern;Decreased stride length   Gait velocity interpretation: <1.8 ft/sec, indicate of risk for recurrent falls General Gait Details: Very slow, steady gait reaching for rail in hallway PRN; 1 standing rest break. 2/4 DOE. HR up to 107 bpm max, Sp02 >94% on RA.   Stairs             Wheelchair  Mobility    Modified Rankin (Stroke Patients Only)       Balance Overall balance assessment: No apparent balance deficits (not formally assessed)                                          Cognition Arousal/Alertness: Awake/alert Behavior During Therapy: WFL for tasks assessed/performed Overall Cognitive Status: Within Functional Limits for tasks assessed                                 General Comments: Feels tired      Exercises      General Comments General comments (skin integrity, edema, etc.): Encouraged IS; discussed returning to cardiopulmonary outpatient rehab      Pertinent Vitals/Pain Pain Assessment: No/denies pain    Home Living                      Prior Function            PT Goals (current goals can now be found in the care plan section) Acute Rehab PT Goals Patient Stated Goal: to get better and continue cardiopulmonary rehab PT Goal Formulation: With patient Time For Goal Achievement: 12/25/20 Potential to Achieve Goals: Good Progress towards PT goals: Progressing toward goals    Frequency    Min 3X/week      PT Plan  Current plan remains appropriate    Co-evaluation              AM-PAC PT "6 Clicks" Mobility   Outcome Measure  Help needed turning from your back to your side while in a flat bed without using bedrails?: None Help needed moving from lying on your back to sitting on the side of a flat bed without using bedrails?: None Help needed moving to and from a bed to a chair (including a wheelchair)?: None Help needed standing up from a chair using your arms (e.g., wheelchair or bedside chair)?: None Help needed to walk in hospital room?: A Little Help needed climbing 3-5 steps with a railing? : A Little 6 Click Score: 22    End of Session   Activity Tolerance: Patient tolerated treatment well Patient left: in chair;with call bell/phone within reach Nurse Communication: Mobility  status PT Visit Diagnosis: Other abnormalities of gait and mobility (R26.89);Difficulty in walking, not elsewhere classified (R26.2)     Time: 9872-1587 PT Time Calculation (min) (ACUTE ONLY): 26 min  Charges:  $Gait Training: 8-22 mins $Therapeutic Exercise: 8-22 mins                     Marisa Severin, PT, DPT Acute Rehabilitation Services Pager [phone] Office [place] 12/11/2020, 10:16 AM

## 2020-12-11 NOTE — Plan of Care (Signed)

## 2020-12-12 ENCOUNTER — Other Ambulatory Visit: Payer: Self-pay

## 2020-12-12 DIAGNOSIS — J9601 Acute respiratory failure with hypoxia: Secondary | ICD-10-CM

## 2020-12-12 DIAGNOSIS — N1831 Chronic kidney disease, stage 3a: Secondary | ICD-10-CM

## 2020-12-12 DIAGNOSIS — I5033 Acute on chronic diastolic (congestive) heart failure: Secondary | ICD-10-CM

## 2020-12-12 NOTE — Patient Outreach (Signed)
[facility]) Care Management  [place]  12/12/2020   BIJAL SIGLIN 10-01-1932 [id number]  [place] Organization [ACO] Patient: [facility] referral from Heart Failure LCSW, [place] for [facility] Meals  Request sent to [place] and also assigned to [place] Coordinator.  For questions,  Natividad Brood, RN BSN [facility] Liaison  [phone] business mobile phone Toll free office [phone]  Fax number: [phone] [email or web address]

## 2020-12-12 NOTE — Patient Outreach (Signed)
[facility]) Care Management  12/12/2020  Patricia Garcia 06/09/1933 [id number]   Follow up call placed to patient, HIPAA verified.  Patient state she is appreciative of follow up and care initiated for meals and care management follow up as referred.  She states, "I really felt that I could do this by myself but I realize I need a little bit of help.  My daughter has feed me and I have leftover food for today. My daughter is at the airport and returning home today. My son is out and about getting some things for me.  Spoke with [place], LCSW who states daughter had called.  Patient gave permission to speak with daughter on her behalf regarding post hospital follow up.  Call placed to [facility], [phone], HIPAA verified and updated her on plans.  She was appreciative for the follow up and care management as planned.  For questions, please contact:  Natividad Brood, RN BSN [facility] Liaison  [phone] business mobile phone Toll free office [phone]  Fax number: [phone] [email or web address]

## 2020-12-14 ENCOUNTER — Other Ambulatory Visit: Payer: Self-pay | Admitting: *Deleted

## 2020-12-14 DIAGNOSIS — I5041 Acute combined systolic (congestive) and diastolic (congestive) heart failure: Secondary | ICD-10-CM

## 2020-12-14 DIAGNOSIS — I252 Old myocardial infarction: Secondary | ICD-10-CM

## 2020-12-14 DIAGNOSIS — I10 Essential (primary) hypertension: Secondary | ICD-10-CM

## 2020-12-14 NOTE — Patient Outreach (Signed)
[facility]) Care Management  12/14/2020  Patricia Garcia [birth year] [id number]   [facility] Telephone Assessment/Screen for post hospital referral  Referral Date: 12/12/20 Referral Source: [facility] liaison Referral Reason: complex care and disease management follow up calls and assess for further needs.SDOH - patient requested [facility] meals Insurance: [employer]Gen Medicareandblue cross and blue shieldsupplement Last admission 12/06/20-12/11/20-acute on chronic diastolic HF  Outreach attempt # 1 successful Patient is able to verify HIPAA, DOB and address Reviewed and addressed referral to [facility] with patient Consent: [facility] RN CM reviewed [facility] services with patient. Patient gave verbal consent for services [facility] telephonic RN CM.   Patricia Garcia reports she did not have bronchitis as diagnosed in urgent care but acute on combined systolic and diastolic congestive Heart Failure (CHF)- EF 25-30%, severely reduced EF compared to 2021 Prior to her admission she was having nausea, vomiting, shortness of breath (sob)  and poor sleeping   She reports since her treatment in the hospital she has been doing well at home   congestive Heart Failure (CHF) She has reviewed her discharge instructions She is aware of the need to weigh daily, reduce fluids and sodium intake and take her diuretic. Today she denies edema and shortness of breath (sob) but continues to have an intermittent cough as heard during the outreach    Today she was able to sleep well  She is aware of issues related to "my weak heart"   She reports not being on a fluid restriction but is monitoring her intake Reviewed her SDOH requested for  [facility] meals Her daughter from [place] visited and cleaned her home and her refrigerator  Removing lots of food.  Left hospital weighing 118 lb 9.7 oz  Has not weighed for 12/14/20 Has all her MD appointments and medicines [facility] RN CM reviewed the heart failure clinic scheduled on 12/21/20 [place] pcp fu 12/24/20 12/27/20 1045 hair appointment Want to return to cardiac rehab after recover     Reviewed Dr Zandra Abts services  Social: Lives alone with good family support of her son Patricia Garcia, daughter in law who are local and a female friend, Patricia Garcia to be independent   Past Medical History:  Diagnosis Date  . Anal or rectal pain   . Anemia   . Arthritis   . CAD (coronary artery disease)    a. NSTEMI 04/2020 s/p DES to [facility] and D1.  . Chronic diastolic CHF (congestive heart failure) ([place])   . Depression   . Diverticulosis of colon (without mention of hemorrhage)   . Hypertension   . Hypoalbuminemia   . Internal hemorrhoids without mention of complication   . Ischemic heart disease   . LBBB (left bundle branch block)   . Osteopenia   . Renal insufficiency   . S/P right hip fracture   . Slow transit constipation    Current Outpatient Medications on File Prior to Visit  Medication Sig Dispense Refill  . ALPRAZolam (XANAX) 0.25 MG tablet 1/2 tablet    . albuterol (VENTOLIN HFA) 108 (90 Base) MCG/ACT inhaler Inhale 1-2 puffs into the lungs every 6 (six) hours as needed for wheezing or shortness of breath. (Patient not taking: No sig reported) 18 g 0  . aspirin 81 MG chewable tablet Chew 81 mg by mouth daily.    . bisoprolol (ZEBETA) 5 MG tablet Take 0.5 tablets (2.5 mg total) by mouth daily. 30 tablet 2  . Calcium Carbonate-Vit D-Min (CALCIUM 1200 PO) Take 1,200 mg  by mouth daily.    . cholecalciferol (VITAMIN D) 1000 UNITS tablet Take 1,000 Units by mouth daily.    . clopidogrel (PLAVIX) 75 MG tablet Take 1 tablet (75 mg total) by mouth daily. 90 tablet 3  . docusate sodium (COLACE) 100 MG capsule Take 100 mg by mouth every other day.    . esomeprazole (NEXIUM) 40 MG capsule Take 1 capsule (40 mg total) by mouth daily. 90 capsule 1  . ferrous sulfate 325 (65 FE) MG tablet Take 325 mg by mouth See admin instructions. 3 times a week    . furosemide (LASIX) 40 MG tablet Take 1  tablet (40 mg total) by mouth daily. 30 tablet 2  . latanoprost (XALATAN) 0.005 % ophthalmic solution Place 1 drop into both eyes at bedtime.    . multivitamin-iron-minerals-folic acid (CENTRUM) chewable tablet Chew 1 tablet by mouth daily.     No current facility-administered medications on file prior to visit.     DME: pill cutter    Plan: Patient agrees to the care plan and follow up with in the next 14-21 business days  Goals Addressed              This Visit's Progress     Patient Stated   .  [facility]) Eat Healthy (pt-stated)   On track     Timeframe:  Short-Term Goal Priority:  High Start Date:                      12/14/20       Expected End Date:      01/17/21                 Follow Up Date 12/21/20 Barriers: Knowledge    - set a realistic goal   Notes:  12/14/20 foods not on her low sodium diet removed by her daughter request [facility] meals Referral to [facility] care guide completed    .  [facility]) Track and Manage Fluids and Swelling-Heart Failure (pt-stated)   On track     Timeframe:  Long-Range Goal Priority:  High Start Date:            12/14/20                  Expected End Date:               03/20/21        Follow Up Date 12/21/20   - call office if I gain more than 2 pounds in one day or 5 pounds in one week - use salt in moderation - watch for swelling in feet, ankles and legs every day - weigh myself daily     Notes:  12/14/20 has not weighed today Left hospital weighing 118 lbs, food removed from her home by her daughter, Pt request SDOH food insecurity assistance Referral to [facility] care guide      Patricia Postlewait L. Lavina Hamman, RN, BSN, [place] Coordinator Office number [phone] Mobile number [phone]  Main [facility] number [phone] Fax number [phone]

## 2020-12-18 ENCOUNTER — Telehealth: Payer: Self-pay | Admitting: Geriatric Medicine

## 2020-12-18 NOTE — Telephone Encounter (Signed)
   Telephone encounter was:  Successful.  12/18/2020 Name: TASHIKA GOODIN MRN: [id number] DOB: October 19, 1932  ETHLYN ALTO is a 87 y.o. year old female who is a primary care patient of [place], Christiane Ha, MD . The community resource team was consulted for assistance with [place] guide performed the following interventions: Patient provided with information about care guide support team and interviewed to confirm resource needs Discussed resources to assist with meal preparations since she just got out of the hospital Obtained verbal consent to place patient referral to [place]. Placed referral to [place] via email application.Pt doesn't need any other community resources.   Follow Up Plan:  No further follow up planned at this time. The patient has been provided with needed resources.  [place], Care Management Phone: [phone] Email: [email or web address]

## 2020-12-19 ENCOUNTER — Telehealth: Payer: Self-pay | Admitting: Cardiology

## 2020-12-19 NOTE — Telephone Encounter (Signed)
ew Message:     Pt called and wanted Dr Johney Frame to know she had a terrible nosebleed. She had huge,huge clots, could not get it to stop bleeding for a long time.EMS was called and it took them over a hour to get it to stop bleeding. Pt is on Plavix and 81 mg of Aspirin. At this time pt is resting.

## 2020-12-19 NOTE — Telephone Encounter (Signed)
I am so sorry that happened. I completely agree that she did the right thing to call EMS. The nosebleed was likely to be triggered by her sinusitis combined with the ASA and plavix. I would advise her to use normal saline nasal drops to try and help moisturize the mucosa of her nose. If she has recurrent bleeding, we can stop the aspirin.

## 2020-12-19 NOTE — Telephone Encounter (Signed)
Spoke with patient about nose bleed that started this morning.  Patient stated she was diagnosed with sinusitis in the last week and she scratched her nose this morning and her nose started bleeding.  Patient called son and he was unable to get it to stop and they called EMS.  Patient stated it took EMS an hour to get her nose to stop bleeding.  Patient had several clots.  Patient was resting when I called and reports her B/P was 132/70 when taken by EMS.  Patient is on Plavix and aspirin.  Reassured patient that she did the right thing calling EMS.  Patient questioned if her sinusitis could have caused this and stated it could have played a part in it and the plavix and aspirin helped with the bleeding.  At end of call patient was relaxed and appreciative.  Advised I would send to Dr. Johney Frame and Karlene Einstein.

## 2020-12-20 NOTE — Progress Notes (Signed)
[facility] Transitions of Care Clinic  PCP: [place], [place] Primary Cardiologist: Gwyndolyn Kaufman  HPI:  Patricia Garcia is a 87 y.o.  female  with a PMH significant for hypertension, CAD s/p DESx2 94/8546, Diastolic CHF, depression, chronic LBBB, and anemia.    Low risk nuclear stress test 08/2019.    She had a mechanical fall resulting in hip fracture requiring surgery on 05/02/20.  She was readmitted to [facility] 05/06/20 with nausea, vomiting, and hypoxia.  She was ultimately found to have a non-STEMI (trops in 3k range) and acute on chronic diastolic heart failure.  She was treated with IV Lasix.  2D echo 05/07/2020 showed hypokinesis of the basal inferior and inferolateral wall, EF 50 to 27%, grade 2 diastolic dysfunction, elevated left atrial pressure, mild mitral regurgitation.  She underwent cardiac cath 05/08/2020 showing three-vessel obstructive coronary artery disease. Underwent PCI of the mid RCA and first diagonal with drug-eluting stents.    Managed in the outpatient setting doing relatively well, working with cardiac rehab until she presented to the ED on 12/06/20 with nausea, vomiting, and shortness of breath. CXR showed small bilateral effusions that appear increased since October with continued diffuse pulmonary interstitial opacity suspicious for edema. BNP elevated 1701. Hs Trop negative x2. She was then admitted for CHF exacerbation. An ECHO was done on 12/07/20 and LVEF is now reduced to 25-30% (was 50-55% in Oct 2021) and mildly reduced RV systolic function. R/LHC done on 5/23 with patent prior placed stents in RCA and 1st diag, and otherwise stable CAD, mildly elevated RH pressures.   Diuresed wt 123->118lbs.    Feels like she has been doing well since leaving the hospital.  Did have a nosebleed several days ago which was successfully stopped.  Says she's not eating well, thinks this is due to her plavix. She does endorse early satiety.  Is drinking boost  milk shakes to help get her calories in.  She can make several laps around her house and doesn't get short of breath.  Weighs herself daily has been 115, 114, 115 stable at home.       ROS: All systems negative except as listed in HPI, PMH and Problem List.  SH:  Social History   Socioeconomic History  . Marital status: Widowed    Spouse name: Not on file  . Number of children: 2  . Years of education: 59  . Highest education level: High school graduate  Occupational History  . Occupation: Retired    Fish farm manager: RETIRED  Tobacco Use  . Smoking status: Former Smoker    Quit date: 06/05/1984    Years since quitting: 36.5  . Smokeless tobacco: Never Used  Vaping Use  . Vaping Use: Never used  Substance and Sexual Activity  . Alcohol use: No  . Drug use: No  . Sexual activity: Not on file  Other Topics Concern  . Not on file  Social History Narrative   Son mark local    Daughter out of town in [place] -Pensions consultant   Social Determinants of Health   Financial Resource Strain: [place]   . Difficulty of Paying Living Expenses: Not very hard  Food Insecurity: No Food Insecurity  . Worried About Charity fundraiser in the Last Year: Never true  . Ran Out of Food in the Last Year: Never true  Transportation Needs: No Transportation Needs  . Lack of Transportation (Medical): No  .  Lack of Transportation (Non-Medical): No  Physical Activity: Insufficiently Active  . Days of Exercise per Week: 2 days  . Minutes of Exercise per Session: 20 min  Stress: No Stress Concern Present  . Feeling of Stress : Not at all  Social Connections: Moderately Integrated  . Frequency of Communication with Friends and Family: More than three times a week  . Frequency of Social Gatherings with Friends and Family: More than three times a week  . Attends Religious Services: 1 to 4 times per year  . Active Member of Clubs or Organizations: Yes  . Attends Theatre manager Meetings: 1 to 4 times per year  . Marital Status: Widowed  Intimate Partner Violence: Not At Risk  . Fear of Current or Ex-Partner: No  . Emotionally Abused: No  . Physically Abused: No  . Sexually Abused: No    FH:  Family History  Problem Relation Age of Onset  . Colon polyps Mother   . Cancer Father        bladder  . Heart disease Father     Past Medical History:  Diagnosis Date  . Anal or rectal pain   . Anemia   . Arthritis   . CAD (coronary artery disease)    a. NSTEMI 04/2020 s/p DES to [facility] and D1.  . Chronic diastolic CHF (congestive heart failure) ([place])   . Depression   . Diverticulosis of colon (without mention of hemorrhage)   . Hypertension   . Hypoalbuminemia   . Internal hemorrhoids without mention of complication   . Ischemic heart disease   . LBBB (left bundle branch block)   . Osteopenia   . Renal insufficiency   . S/P right hip fracture   . Slow transit constipation     Current Outpatient Medications  Medication Sig Dispense Refill  . albuterol (VENTOLIN HFA) 108 (90 Base) MCG/ACT inhaler Inhale 1-2 puffs into the lungs every 6 (six) hours as needed for wheezing or shortness of breath. 18 g 0  . aspirin 81 MG chewable tablet Chew 81 mg by mouth daily.    . bisoprolol (ZEBETA) 5 MG tablet Take 0.5 tablets (2.5 mg total) by mouth daily. 30 tablet 2  . Calcium Carbonate-Vit D-Min (CALCIUM 1200 PO) Take 1,200 mg by mouth daily.    . cholecalciferol (VITAMIN D) 1000 UNITS tablet Take 1,000 Units by mouth daily.    . clopidogrel (PLAVIX) 75 MG tablet Take 1 tablet (75 mg total) by mouth daily. 90 tablet 3  . docusate sodium (COLACE) 100 MG capsule Take 100 mg by mouth every other day.    . esomeprazole (NEXIUM) 40 MG capsule Take 1 capsule (40 mg total) by mouth daily. 90 capsule 1  . ferrous sulfate 325 (65 FE) MG tablet Take 325 mg by mouth See admin instructions. 3 times a week    . furosemide (LASIX) 40 MG tablet Take 1 tablet (40 mg  total) by mouth daily. 30 tablet 2  . latanoprost (XALATAN) 0.005 % ophthalmic solution Place 1 drop into both eyes at bedtime.    . multivitamin-iron-minerals-folic acid (CENTRUM) chewable tablet Chew 1 tablet by mouth daily.     No current facility-administered medications for this encounter.    Vitals:   12/21/20 1110  BP: 130/80  Pulse: 90  SpO2: 96%  Weight: 53 kg (116 lb 12.8 oz)    PHYSICAL EXAM: Cardiac: JVD flat, normal rate and rhythm, clear s1 and s2, no murmurs, rubs or gallops, no  LE edema Pulmonary: CTAB, not in distress Abdominal: non distended abdomen, soft and nontender Psych: Alert, conversant, in good spirits   ASSESSMENT & PLAN: Combined systolic and diastolic CHF: -03/00/9233 ECHO with hypokinesis of the basal inferior and inferolateral wall, EF 50 to 00%, grade 2 diastolic dysfunction, -LHC 05/08/2020 showing three-vessel obstructive coronary artery disease. Underwent PCI of the mid RCA and first diagonal with drug-eluting stents. -ECHO 12/07/20 EF 25-30%,  mildly reduced RV systolic function.  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    CAD:  -R/LHC done on 5/23 with patent prior placed stents in RCA and 1st diag, and otherwise stable CAD -Continue DAPT, statin  Iron Deficiency Anemia: -recent severe epistaxis episode -check iron studies today, cbc  Follow up with me in one week, then [facility]

## 2020-12-21 ENCOUNTER — Encounter (HOSPITAL_COMMUNITY): Payer: Self-pay

## 2020-12-21 ENCOUNTER — Other Ambulatory Visit: Payer: Self-pay

## 2020-12-21 ENCOUNTER — Other Ambulatory Visit: Payer: Self-pay | Admitting: *Deleted

## 2020-12-21 ENCOUNTER — Ambulatory Visit (HOSPITAL_COMMUNITY)
Admit: 2020-12-21 | Discharge: 2020-12-21 | Disposition: A | Discharge status: Home / Self Care | Payer: Medicare Other | Attending: Internal Medicine | Admitting: Internal Medicine

## 2020-12-21 VITALS — BP 130/80 | HR 90 | Wt 116.8 lb

## 2020-12-21 DIAGNOSIS — J90 Pleural effusion, not elsewhere classified: Secondary | ICD-10-CM | POA: Insufficient documentation

## 2020-12-21 DIAGNOSIS — D649 Anemia, unspecified: Secondary | ICD-10-CM | POA: Diagnosis not present

## 2020-12-21 DIAGNOSIS — Z7982 Long term (current) use of aspirin: Secondary | ICD-10-CM | POA: Diagnosis not present

## 2020-12-21 DIAGNOSIS — Z79899 Other long term (current) drug therapy: Secondary | ICD-10-CM | POA: Diagnosis not present

## 2020-12-21 DIAGNOSIS — Z5181 Encounter for therapeutic drug level monitoring: Secondary | ICD-10-CM | POA: Insufficient documentation

## 2020-12-21 DIAGNOSIS — I110 Hypertensive heart disease with heart failure: Secondary | ICD-10-CM | POA: Insufficient documentation

## 2020-12-21 DIAGNOSIS — I5042 Chronic combined systolic (congestive) and diastolic (congestive) heart failure: Secondary | ICD-10-CM

## 2020-12-21 DIAGNOSIS — D509 Iron deficiency anemia, unspecified: Secondary | ICD-10-CM | POA: Diagnosis not present

## 2020-12-21 DIAGNOSIS — I447 Left bundle-branch block, unspecified: Secondary | ICD-10-CM | POA: Diagnosis not present

## 2020-12-21 DIAGNOSIS — Z7902 Long term (current) use of antithrombotics/antiplatelets: Secondary | ICD-10-CM | POA: Diagnosis not present

## 2020-12-21 DIAGNOSIS — I252 Old myocardial infarction: Secondary | ICD-10-CM | POA: Diagnosis not present

## 2020-12-21 DIAGNOSIS — Z955 Presence of coronary angioplasty implant and graft: Secondary | ICD-10-CM | POA: Insufficient documentation

## 2020-12-21 DIAGNOSIS — Z87891 Personal history of nicotine dependence: Secondary | ICD-10-CM | POA: Diagnosis not present

## 2020-12-21 DIAGNOSIS — I2510 Atherosclerotic heart disease of native coronary artery without angina pectoris: Secondary | ICD-10-CM | POA: Diagnosis not present

## 2020-12-21 LAB — BASIC METABOLIC PANEL
Anion gap: 11 (ref 5–15)
BUN: 20 mg/dL (ref 8–23)
CO2: 28 mmol/L (ref 22–32)
Calcium: 9.8 mg/dL (ref 8.9–10.3)
Chloride: 100 mmol/L (ref 98–111)
Creatinine, Ser: 1.14 mg/dL — ABNORMAL HIGH (ref 0.44–1.00)
GFR, Estimated: 47 mL/min — ABNORMAL LOW (ref >60)
Glucose, Bld: 154 mg/dL — ABNORMAL HIGH (ref 70–99)
Potassium: 3.7 mmol/L (ref 3.5–5.1)
Sodium: 139 mmol/L (ref 135–145)

## 2020-12-21 LAB — BRAIN NATRIURETIC PEPTIDE: B Natriuretic Peptide: 1840.3 pg/mL — ABNORMAL HIGH (ref 0.0–100.0)

## 2020-12-21 LAB — IRON AND TIBC
Iron: 53 ug/dL (ref 28–170)
Saturation Ratios: 13 % (ref 10.4–31.8)
TIBC: 420 ug/dL (ref 250–450)
UIBC: 367 ug/dL

## 2020-12-21 LAB — FERRITIN: Ferritin: 179 ng/mL (ref 11–307)

## 2020-12-21 LAB — CBC
HCT: 35.1 % — ABNORMAL LOW (ref 36.0–46.0)
Hemoglobin: 11 g/dL — ABNORMAL LOW (ref 12.0–15.0)
MCH: 27.8 pg (ref 26.0–34.0)
MCHC: 31.3 g/dL (ref 30.0–36.0)
MCV: 88.9 fL (ref 80.0–100.0)
Platelets: 391 10*3/uL (ref 150–400)
RBC: 3.95 MIL/uL (ref 3.87–5.11)
RDW: 14.2 % (ref 11.5–15.5)
WBC: 9.3 10*3/uL (ref 4.0–10.5)
nRBC: 0 % (ref 0.0–0.2)

## 2020-12-21 MED ORDER — FUROSEMIDE 40 MG PO TABS: 20.0000 mg | ORAL_TABLET | Freq: Every day | ORAL | 2 refills | Status: DC

## 2020-12-21 MED ORDER — SPIRONOLACTONE 25 MG PO TABS: 12.5000 mg | ORAL_TABLET | Freq: Every day | ORAL | 0 refills | Status: DC

## 2020-12-21 NOTE — Progress Notes (Addendum)
[facility] Transitions of Care Clinic Heart Failure Pharmacist Encounter  PCP: Lajean Manes, MD PCP-Cardiologist: Freada Bergeron, MD  HPI:  87 yo F with PMH of CAD s/p PCI, HTN, LBBB, and CHF. She presented to the ED on 12/06/20 with nausea, vomiting, and shortness of breath. CXR showed small bilateral effusions that appear increased since October with continued diffuse pulmonary interstitial opacity suspicious for edema. BNP elevated 1701. Hs Trop negative x2. She was then admitted for CHF exacerbation. An ECHO was done on 12/07/20 and LVEF is now reduced to 25-30% (was 50-55% in Oct 2021) and mildly reduced RV systolic function. R/LHC done on 5/23 and recommended GDMT for HFrEF and medical management for concomitant CAD. She was then discharged on 5/24.  Today, Patricia Garcia presents to the [facility] for follow up. She states she only has shortness of breath on exertion. Denies orthopnea/PND/edema. Denies lightheadedness or dizziness. Checks her weights daily at home but does not check her BP - states her cardiologist told her years ago to stop checking it because it was causing her anxiety. She is following a low-sodium diet at home but appears to still be drinking more fluid than she should. She is taking all medications as prescribed.   HF Medications: Furosemide 40 mg daily Bisoprolol 2.5 mg daily  Has the patient been experiencing any side effects to the medications prescribed?  no  Does the patient have any problems obtaining medications due to transportation or finances?   no  Understanding of regimen: good Understanding of indications: good Potential of compliance: good Patient understands to avoid NSAIDs. Patient understands to avoid decongestants.   Pertinent Lab Values: Serum creatinine 1.14, BUN 20, Potassium 3.7, Sodium 139 Iron 53, TST 13, Ferritin 179  Vital Signs: . Weight: 116 lbs (discharge weight: 118 lbs) . Blood pressure:  130/80  . Heart rate: 90   Medication Assistance / Insurance Benefits Check: Does the patient have prescription insurance?  Yes Type of insurance plan: Medicare + BCBS supplement  Outpatient Pharmacy:  Current outpatient pharmacy: [facility] Was the [facility] pharmacy used to supply discharge medications? no   Assessment: 1) Chronic systolic CHF (EF 98-92%), due to ICM. NYHA class II symptoms. - Reduce furosemide to 20 mg daily - Continue bisoprolol 2.5 mg daily - Start spironolactone 12.5 mg daily. BMET today and in 1 week. - Consider starting losartan and/or SGLT2i prior to discharge   Plan: 1) Medication changes: - Reduce furosemide to 20 mg daily - Start spironolactone 12.5 mg daily - IV feraheme x 1 dose - will call short stay to schedule iron infusion  2) Patient Assistance: - None pending  3) Follow up: - Next appointment with HF TOC clinic on 6/10 with BMET  Kerby Nora, PharmD, BCPS Heart Failure Transitions of Care Clinic Pharmacist [phone]

## 2020-12-21 NOTE — Patient Outreach (Signed)
[facility]) Care Management  12/21/2020  KASIDY GIANINO Dec 22, 1932 [id number]   [facility] Telephone Assessment/Screen for post hospital referral  Referral Date: 12/12/20 Referral Source: [facility] liaison Referral Reason: complex care and disease management follow up calls and assess for further needs.SDOH - patient requested [facility] meals Insurance: NextGen Medicareandblue cross and blue shieldsupplement Last admission 12/06/20-12/11/20-acute on chronic diastolic HF  Outreach successful Patient is able to verify HIPAA, DOB and address Reviewed and addressed referral to [facility] patient Consent: [facility] RN CM reviewed [facility] services with patient. Patient gave verbal consent for services [facility] telephonic RN CM.  Follow up  fall resulting in hip fracture requiring surgery on 05/02/20. Follow up heart failure clinic (shortness of breath (sob) / cardiac office visit  Sleep medicine, trazodone no slept well 12/20/20 had anxiety attack r/t sob Recent nose bleed r/t sinusitis and EMS had to be called to help control the bleeding when she Now keeping nose moist with ocean saline s Still on Plavix and asa Confirmed [name] [facility] care guide outreach Pcp 6/6/ 10 am   Spoke with Norine to confirm meals to be delivered 12/21/20 or 12/24/20 updated pt    Plans Patient agrees to care plan and follow up within the next 30 business days Pt encouraged to return a call to [place] CM prn   Larri Yehle L. Lavina Hamman, RN, BSN, [place] Coordinator Office number [phone] Main [facility] number [phone] Fax number [phone]

## 2020-12-21 NOTE — Telephone Encounter (Signed)
Spoke to the patient and gave recommendations. She verbalized understanding.

## 2020-12-21 NOTE — Patient Instructions (Addendum)
Start Spironolactone 12.5 mg (1/2 tab) Daily  Decrease Furosemide to 20 mg (1/2 tab) Daily  Labs done today, we will call you for abnormal results  Thank you for allowing [place] to provider your heart failure care after your recent hospitalization. Please follow-up with [place] in 1 week and then [facility] on 01/17/21  If you have any questions, issues, or concerns before your next appointment please call our office at [phone], opt. 2 and leave a message for the triage nurse.  Do the following things EVERYDAY: 1) Weigh yourself in the morning before breakfast. Write it down and keep it in a log. 2) Take your medicines as prescribed 3) Eat low salt foods--Limit salt (sodium) to 2000 mg per day.  4) Stay as active as you can everyday 5) Limit all fluids for the day to less than 2 liters  Weigh yourself EVERY morning after you go to the bathroom but before you eat or drink anything. Write this number down in a weight log/diary. If you gain 3 pounds overnight or 5 pounds in a week, call the heart failure clinic  Limit your fluid intake to less than 2 liters of fluid per day. Fluid includes all drinks, coffee, juice, ice chips, soup, jello, and all other liquids.  Restrict your sodium intake to less than 2000mg  per day. This will help prevent your body from holding onto fluid. Read food labels as a lot of canned and packaged foods have a lot of sodium.

## 2020-12-24 DIAGNOSIS — I129 Hypertensive chronic kidney disease with stage 1 through stage 4 chronic kidney disease, or unspecified chronic kidney disease: Secondary | ICD-10-CM | POA: Diagnosis not present

## 2020-12-24 DIAGNOSIS — N1831 Chronic kidney disease, stage 3a: Secondary | ICD-10-CM | POA: Diagnosis not present

## 2020-12-24 DIAGNOSIS — I5021 Acute systolic (congestive) heart failure: Secondary | ICD-10-CM | POA: Diagnosis not present

## 2020-12-24 DIAGNOSIS — R040 Epistaxis: Secondary | ICD-10-CM | POA: Diagnosis not present

## 2020-12-24 DIAGNOSIS — J9601 Acute respiratory failure with hypoxia: Secondary | ICD-10-CM | POA: Diagnosis not present

## 2020-12-25 ENCOUNTER — Telehealth (HOSPITAL_COMMUNITY): Payer: Self-pay | Admitting: *Deleted

## 2020-12-25 DIAGNOSIS — E7800 Pure hypercholesterolemia, unspecified: Secondary | ICD-10-CM | POA: Diagnosis not present

## 2020-12-25 DIAGNOSIS — N1831 Chronic kidney disease, stage 3a: Secondary | ICD-10-CM | POA: Diagnosis not present

## 2020-12-25 DIAGNOSIS — K219 Gastro-esophageal reflux disease without esophagitis: Secondary | ICD-10-CM | POA: Diagnosis not present

## 2020-12-25 DIAGNOSIS — M160 Bilateral primary osteoarthritis of hip: Secondary | ICD-10-CM | POA: Diagnosis not present

## 2020-12-25 DIAGNOSIS — F320 Major depressive disorder, single episode, mild: Secondary | ICD-10-CM | POA: Diagnosis not present

## 2020-12-25 DIAGNOSIS — I5033 Acute on chronic diastolic (congestive) heart failure: Secondary | ICD-10-CM | POA: Diagnosis not present

## 2020-12-25 DIAGNOSIS — E785 Hyperlipidemia, unspecified: Secondary | ICD-10-CM | POA: Diagnosis not present

## 2020-12-25 DIAGNOSIS — I2510 Atherosclerotic heart disease of native coronary artery without angina pectoris: Secondary | ICD-10-CM | POA: Diagnosis not present

## 2020-12-25 DIAGNOSIS — I5021 Acute systolic (congestive) heart failure: Secondary | ICD-10-CM | POA: Diagnosis not present

## 2020-12-25 DIAGNOSIS — M479 Spondylosis, unspecified: Secondary | ICD-10-CM | POA: Diagnosis not present

## 2020-12-25 NOTE — Telephone Encounter (Signed)
Pt called stating she has been having trouble sleeping so her PCP Dr.StoneKing prescribed trazadone 25mg . Pt said she has been up 2 nights straight and trazadone has not helped. She called his office and was told to contact cardiology to see what medications we recommend. Pt denies any shortness of breath or fluid overload. Per Dr.Winfrey and Pharmacist Meghan pcp can try Ramelteon 8 Mg Tablet 30 minutes before bedtime. I advised pt and she said she would contact pcp. Pt did complain of sinus drainage and I recommended OTC plain mucinex pt advised to contact pcp if sinus problem did not clear up.

## 2020-12-26 ENCOUNTER — Telehealth (HOSPITAL_COMMUNITY): Payer: Self-pay | Admitting: *Deleted

## 2020-12-26 ENCOUNTER — Encounter (HOSPITAL_COMMUNITY): Payer: Self-pay | Admitting: *Deleted

## 2020-12-26 DIAGNOSIS — Z955 Presence of coronary angioplasty implant and graft: Secondary | ICD-10-CM

## 2020-12-26 NOTE — Progress Notes (Signed)
Discharge Progress Report  Patient Details  Name: Patricia Garcia MRN: [id number] Date of Birth: [birth year] Referring Provider:   Flowsheet Row CARDIAC REHAB PHASE II ORIENTATION from 09/25/2020 in [place]  Referring Provider Freada Bergeron, MD        Number of Visits: 15  Reason for Discharge:  Early Exit:  medical issues  Smoking History:  Social History   Tobacco Use  Smoking Status Former   Pack years: 0.00   Types: Cigarettes   Quit date: 06/05/1984   Years since quitting: 36.5  Smokeless Tobacco Never    Diagnosis:  05/08/20 S/P DES RCA and Diagonal  ADL UCSD:   Initial Exercise Prescription:   Discharge Exercise Prescription (Final Exercise Prescription Changes):  Exercise Prescription Changes - 11/21/20 1330       Response to Exercise   Blood Pressure (Admit) 120/60    Blood Pressure (Exercise) 122/64    Blood Pressure (Exit) 122/60    Heart Rate (Admit) 100 bpm    Heart Rate (Exercise) 125 bpm    Heart Rate (Exit) 102 bpm    Rating of Perceived Exertion (Exercise) 11    Symptoms None    Comments Pt's last day of exercise in the CRP2 program    Duration Continue with 30 min of aerobic exercise without signs/symptoms of physical distress.    Intensity THRR unchanged      Progression   Progression Continue to progress workloads to maintain intensity without signs/symptoms of physical distress.    Average METs 2.2      Resistance Training   Training Prescription No    Weight No weights on Wednesdays      Interval Training   Interval Training No      NuStep   Level 2    SPM 85    Minutes 20    METs 1.9      Track   Laps 5    Minutes 4    METs 2.39      Home Exercise Plan   Plans to continue exercise at Home (comment)    Frequency Add 2 additional days to program exercise sessions.    Initial Home Exercises Provided 11/02/20             Functional Capacity:  6 Minute Walk     Row Name  11/16/20 1330         6 Minute Walk   Phase Discharge     Distance 1200 feet     Distance % Change 10.4 %     Distance Feet Change 125 ft     Walk Time 6 minutes     # of Rest Breaks 0     MPH 2.27     METS 2.37     RPE 11     Perceived Dyspnea  1     VO2 Peak 8.32     Symptoms Yes (comment)     Comments SOB, RPD = !     Resting HR 92 bpm     Resting BP 134/70     Resting Oxygen Saturation  98 %     Exercise Oxygen Saturation  during 6 min walk 98 %     Max Ex. HR 137 bpm     Max Ex. BP 132/52              Psychological, QOL, Others - Outcomes: PHQ 2/9: Depression screen [facility] 2/9 12/21/2020 10/25/2020 09/27/2020 09/26/2020 08/27/2020  Decreased Interest 0 0 0 0 1  Down, Depressed, Hopeless 0 0 0 0 1  PHQ - 2 Score 0 0 0 0 2  Altered sleeping - - - - 2  Tired, decreased energy - - - - 1  Change in appetite - - - - 1  Feeling bad or failure about yourself  - - - - 0  Trouble concentrating - - - - 0  Moving slowly or fidgety/restless - - - - 0  Suicidal thoughts - - - - 0  PHQ-9 Score - - - - 6  Difficult doing work/chores - - - - Somewhat difficult    Quality of Life:   Personal Goals: Goals established at orientation with interventions provided to work toward goal.    Personal Goals Discharge:  Goals and Risk Factor Review     Row Name 11/13/20 1550 12/07/20 1439 12/26/20 1209         Core Components/Risk Factors/Patient Goals Review   Personal Goals Review Weight Management/Obesity;Heart Failure;Stress;Hypertension;Lipids Weight Management/Obesity;Heart Failure;Stress;Hypertension;Lipids Weight Management/Obesity;Heart Failure;Stress;Hypertension;Lipids     Review Patricia Garcia has been doing well with exercise at cardiac rehab. Vital signs have been stable. Patricia Garcia continues to have to rest so that her heart rate will come down below 100 to exercise. Patricia Garcia had been doing well with exercise at cardiac rehab. Vital signs have been stable. Patricia Garcia continues to have to  rest so that her heart rate will come down below 100 to exercise. Patricia Garcia last exercise session was on 11/21/20. Patricia Garcia is currenlty admitted with a CHF exacerbation. Exercise is on hold. Patricia Garcia has been discharged from cardiac rehab last exercise session was on 11/21/20     Expected Outcomes Patricia Garcia will continue to partcipate in phase 2 cardiac rehab for exercise, nutrition and lifestyle modifications Patricia Garcia will continue to partcipate in phase 2 cardiac rehab for exercise, nutrition and lifestyle modifications. When appropriate to return. Patricia Garcia will continue to exercise at home as she is able..              Exercise Goals and Review:   Exercise Goals Re-Evaluation:  Exercise Goals Re-Evaluation     [place] Name 11/02/20 1445             Exercise Goal Re-Evaluation   Exercise Goals Review Increase Physical Activity;Increase Strength and Stamina;Able to understand and use rate of perceived exertion (RPE) scale;Knowledge and understanding of Target Heart Rate Range (THRR);Understanding of Exercise Prescription       Comments Reviewed METs, goals, and Home exercise Rx with patient today. Pt voices she is walking at home in her driveway and cul-de-sac already.       Expected Outcomes Pt will continue to walk at home.                Nutrition & Weight - Outcomes:    Nutrition:   Nutrition Discharge:   Education Questionnaire Score:  Pt completed cardiac rehab program on with completion of 15 exercise sessions in Phase II between 10/03/20-11/21/20. Pt maintained good attendance and progressed nicely during [place] participation in rehab as evidenced by increased MET level.   Did not get to reassess phq2-9 or medications as Patricia Garcia did not return to cardiac rehab after her hospitalization in May . Patricia Garcia enjoyed participating in cardiac rehab when she was able to attend. Patient did have to rest before starting exercise due to heart rate. Barnet Pall, RN,BSN 12/27/2020 8:32 AM

## 2020-12-26 NOTE — Telephone Encounter (Signed)
Spoke with Quintina will discharge from cardiac rehab as her last exercise session was on 11/21/20 before her hospital admission. Felice says she may want to participate in cardiac rehab at a later date and time when she is feeling better. Barnet Pall, RN,BSN 12/26/2020 12:07 PM

## 2020-12-27 ENCOUNTER — Telehealth (HOSPITAL_COMMUNITY): Payer: Self-pay | Admitting: Licensed Clinical Social Worker

## 2020-12-27 NOTE — Addendum Note (Signed)
Encounter addended by: Lesly Rubenstein on: 12/27/2020 7:51 AM  Actions taken: Flowsheet data copied forward, Flowsheet accepted

## 2020-12-27 NOTE — Addendum Note (Signed)
Encounter addended by: Lesly Rubenstein on: 12/27/2020 7:58 AM  Actions taken: Flowsheet accepted

## 2020-12-27 NOTE — Telephone Encounter (Signed)
CSW contacted patient to remind of [facility] tomorrow. Message left. Raquel Sarna, [place], [place]

## 2020-12-28 ENCOUNTER — Other Ambulatory Visit: Payer: Self-pay

## 2020-12-28 ENCOUNTER — Ambulatory Visit (HOSPITAL_COMMUNITY)
Admission: RE | Admit: 2020-12-28 | Discharge: 2020-12-28 | Disposition: A | Discharge status: Home / Self Care | Payer: Medicare Other | Source: Ambulatory Visit | Attending: Internal Medicine | Admitting: Internal Medicine

## 2020-12-28 ENCOUNTER — Other Ambulatory Visit (HOSPITAL_COMMUNITY): Payer: Self-pay

## 2020-12-28 ENCOUNTER — Encounter (HOSPITAL_COMMUNITY): Payer: Self-pay

## 2020-12-28 ENCOUNTER — Other Ambulatory Visit: Payer: Self-pay | Admitting: *Deleted

## 2020-12-28 VITALS — BP 120/64 | HR 92 | Wt 119.0 lb

## 2020-12-28 DIAGNOSIS — Z7982 Long term (current) use of aspirin: Secondary | ICD-10-CM | POA: Insufficient documentation

## 2020-12-28 DIAGNOSIS — I252 Old myocardial infarction: Secondary | ICD-10-CM | POA: Insufficient documentation

## 2020-12-28 DIAGNOSIS — I5042 Chronic combined systolic (congestive) and diastolic (congestive) heart failure: Secondary | ICD-10-CM | POA: Diagnosis not present

## 2020-12-28 DIAGNOSIS — I447 Left bundle-branch block, unspecified: Secondary | ICD-10-CM | POA: Diagnosis not present

## 2020-12-28 DIAGNOSIS — D649 Anemia, unspecified: Secondary | ICD-10-CM

## 2020-12-28 DIAGNOSIS — G4700 Insomnia, unspecified: Secondary | ICD-10-CM | POA: Insufficient documentation

## 2020-12-28 DIAGNOSIS — I5040 Unspecified combined systolic (congestive) and diastolic (congestive) heart failure: Secondary | ICD-10-CM | POA: Insufficient documentation

## 2020-12-28 DIAGNOSIS — Z79899 Other long term (current) drug therapy: Secondary | ICD-10-CM | POA: Diagnosis not present

## 2020-12-28 DIAGNOSIS — Z8249 Family history of ischemic heart disease and other diseases of the circulatory system: Secondary | ICD-10-CM | POA: Insufficient documentation

## 2020-12-28 DIAGNOSIS — I110 Hypertensive heart disease with heart failure: Secondary | ICD-10-CM | POA: Diagnosis not present

## 2020-12-28 DIAGNOSIS — D509 Iron deficiency anemia, unspecified: Secondary | ICD-10-CM | POA: Insufficient documentation

## 2020-12-28 DIAGNOSIS — I2510 Atherosclerotic heart disease of native coronary artery without angina pectoris: Secondary | ICD-10-CM

## 2020-12-28 DIAGNOSIS — Z87891 Personal history of nicotine dependence: Secondary | ICD-10-CM | POA: Insufficient documentation

## 2020-12-28 DIAGNOSIS — Z955 Presence of coronary angioplasty implant and graft: Secondary | ICD-10-CM | POA: Diagnosis not present

## 2020-12-28 MED ORDER — FUROSEMIDE 40 MG PO TABS: 40.0000 mg | ORAL_TABLET | Freq: Every day | ORAL | 2 refills | Status: DC

## 2020-12-28 NOTE — Patient Outreach (Signed)
[place] [facility]) Care Management  12/28/2020  Patricia Garcia Apr 03, 1933 [id number]   [facility] unsuccessful Telephone Assessment/Screen for post hospital/complex care referral   Referral Date: 12/12/20 Referral Source: [facility] liaison Referral Reason: complex care and disease management follow up calls and assess for further needs.SDOH - patient requested [facility] meals Insurance: NextGen Medicare and blue cross and blue shield supplement Last admission 12/06/20-12/11/20-acute on chronic diastolic HF   Outreach successful Patient is able to verify HIPAA, DOB and address Reviewed and addressed referral to [facility] with patient Consent: [facility] RN CM reviewed [facility] services with patient. Patient gave verbal consent for services [facility] telephonic RN CM.   [facility] Unsuccessful outreach   Outreach attempt to the home number  No answer. [facility] RN CM unable to leave a HIPAA ([place]) compliant voicemail message   Plan: [facility] RN CM scheduled this patient for another call attempt within 4-7 business days  Patricia Kuck L. Lavina Hamman, RN, BSN, [place] Coordinator Office number [phone] Mobile number [phone]  Main [facility] number [phone] Fax number [phone]

## 2020-12-28 NOTE — Progress Notes (Signed)
Heart and Vascular Care Navigation  12/28/2020  ZARIANA STRUB 1932-09-06 [id number]  Reason for Referral: Patient seen in HF TOC.   Engaged with patient face to face for follow up visit for Heart and Vascular Care Coordination.                                                                                                   Assessment:      CSW met with patient in the clinic to follow up on last visit. Patient states she has been feeling down and became tearful during the visit. She shared about the loss of her husband and feeling like she doesn't get out and do like she used too. She said she is having difficulty with sleeping and has loss her appetite.     HRT/VAS Care Coordination     Living arrangements for the past 2 months Single Family Home   Lives with: [employer] Eyeglasses; Hearing aid; Scales   DME Agency NA   [place]   Current home services Home PT; Home OT  Patient was previously active with [facility] for [facility].       Social History:                                                                             SDOH Screenings   Alcohol Screen: Not on file  Depression (PHQ2-9): Low Risk    PHQ-2 Score: 0  Financial Resource Strain: Low Risk    Difficulty of Paying Living Expenses: Not very hard  Food Insecurity: No Food Insecurity   Worried About Charity fundraiser in the Last Year: Never true   Ran Out of Food in the Last Year: Never true  Housing: Low Risk    Last Housing Risk Score: 0  Physical Activity: Insufficiently Active   Days of Exercise per Week: 2 days   Minutes of Exercise per Session: 20 min  Social Connections: Moderately Integrated   Frequency of Communication with Friends and Family: More than three times a week   Frequency of Social Gatherings with Friends and Family: More than three times a week   Attends Religious Services: 1 to 4 times per year   Active Member of [employer] or  Organizations: Yes   Attends Archivist Meetings: 1 to 4 times per year   Marital Status: Widowed  Stress: No Stress Concern Present   Feeling of Stress : Not at all  Tobacco Use: Medium Risk   Smoking Tobacco Use: Former   Smokeless Tobacco Use: Never  Transportation Needs: No Data processing manager (Medical): No   Lack of Transportation (Non-Medical): No    SDOH Interventions: Financial Resources:    N/a   [employer]  Insecurity:   N/a  Housing Insecurity:   N/a  Transportation:    N/a   Health Promotion Interventions:     Physical Inactivity Patient provided information on Silver Sneakers  Smoking Cessation N/a  Dietary Concerns N/a  Advertising copywriter N/a   Other Care Navigation Interventions:     Inpatient/Outpatient Substance Abuse Counseling/Rehab Options N/a  Provided Pharmacy assistance resources    Patient expressed Mental Health concerns Yes, Referred to:  Patient provided tools for coping and de stress activities  Patient Referred to: N/a   Follow-up plan:  CSW discuss various coping strategies and activities to de stress during the night and some activities to increase her activity during the day. CSW also provided information on [employer] [place] program at the [facility] for added activity during the day. Patient also inquired about a new PCP and was provided a list of PCP's in the area. Patient appears to be be motivated for improved health and activity. CSW continues to be available as needed. Raquel Sarna, [place], [place]

## 2020-12-28 NOTE — Patient Instructions (Addendum)
No labs done today.   Remote [facility] will be in contact with you  Increase lasix to 40mg  BID starting this afternoon and through the weekend.  On Monday June 13th 2022 please start Lasix 40mg  (1 tablet) by mouth daily.  No other medication changes were made. Please continue all other medications as prescribed.   Please keep your pending appointment with [facility] on June 30th 2022 at 8:45am

## 2020-12-28 NOTE — Progress Notes (Signed)
Heart and Vascular Center Transitions of Care Clinic Heart Failure Pharmacist Encounter  PCP: Lajean Manes, MD PCP-Cardiologist: Freada Bergeron, MD  Patricia Garcia with [facility] to determine eligibility for diuretic monitoring until next clinic appt on 6/30. They have accepted her into the program and will call her tomorrow morning to schedule first visit with patient tomorrow.   Patient is fluid overloaded on exam today - has been instructed to increase lasix to 40 mg BID x 3 days, then to take 40 mg daily.   Ordered IV iron (Feraheme) x 1 dose. Attempted to contact infusion center to schedule appt but no answer x 3 from their clinic. Will re-attempt to call on Monday. I have received preferred dates from patient/family.   Kerby Nora, PharmD, BCPS Heart Failure Transitions of Care Clinic Pharmacist [phone]

## 2020-12-28 NOTE — Progress Notes (Addendum)
Heart and Vascular Center Transitions of Care Clinic  PCP: [place], [place] Primary Cardiologist: Gwyndolyn Kaufman  HPI:  Patricia Garcia is a 87 y.o.  female  with a PMH significant for hypertension, CAD s/p DESx2 36/1443, Diastolic CHF, depression, chronic LBBB, and anemia.    Low risk nuclear stress test 08/2019.    She had a mechanical fall resulting in hip fracture requiring surgery on 05/02/20.  She was readmitted to [facility] 05/06/20 with nausea, vomiting, and hypoxia.  She was ultimately found to have a non-STEMI (trops in 3k range) and acute on chronic diastolic heart failure.  She was treated with IV Lasix.  2D echo 05/07/2020 showed hypokinesis of the basal inferior and inferolateral wall, EF 50 to 15%, grade 2 diastolic dysfunction, elevated left atrial pressure, mild mitral regurgitation.  She underwent cardiac cath 05/08/2020 showing three-vessel obstructive coronary artery disease. Underwent PCI of the mid RCA and first diagonal with drug-eluting stents.    Managed in the outpatient setting doing relatively well, working with cardiac rehab until she presented to the ED on 12/06/20 with nausea, vomiting, and shortness of breath. CXR showed small bilateral effusions that appear increased since October with continued diffuse pulmonary interstitial opacity suspicious for edema. BNP elevated 1701. Hs Trop negative x2. She was then admitted for CHF exacerbation. An ECHO was done on 12/07/20 and LVEF is now reduced to 25-30% (was 50-55% in Oct 2021) and mildly reduced RV systolic function. R/LHC done on 5/23 with patent prior placed stents in RCA and 1st diag, and otherwise stable CAD, mildly elevated RH pressures.   Diuresed wt 123->118lbs.    Last visit she was doing well making several laps around her house and doesn't get short of breath volume status looked good, lost two additional lbs since discharge.  We reduced lasix to 20mg  and added spiro 12.5mg .   Today she reports her  biggest complaint is insomnia, she denies PND or shortness of breath that keeps her from sleeping says mainly she has to get up in the middle of the night to urinate and can't get back to sleep, no trouble getting to sleep initially although she admits to poor sleep hygiene and drinking coca cola at night.   She reports she is more short of breath with exertion compared with our last visit.  She has been drinking a lot of fluid.     ROS: All systems negative except as listed in HPI, PMH and Problem List.  SH:  Social History   Socioeconomic History   Marital status: Widowed    Spouse name: Not on file   Number of children: 2   Years of education: 12   Highest education level: High school graduate  Occupational History   Occupation: Retired    Fish farm manager: RETIRED  Tobacco Use   Smoking status: Former    Pack years: 0.00    Types: Cigarettes    Quit date: 06/05/1984    Years since quitting: 36.5   Smokeless tobacco: Never  Vaping Use   Vaping Use: Never used  Substance and Sexual Activity   Alcohol use: No   Drug use: No   Sexual activity: Not on file  Other Topics Concern   Not on file  Social History Narrative   Son mark local    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   Social Determinants of Health   Financial Resource Strain: Low Risk    Difficulty of  Paying Living Expenses: Not very hard  Food Insecurity: No Food Insecurity   Worried About [place] in the Last Year: Never true   Ran Out of Food in the Last Year: Never true  Transportation Needs: No Transportation Needs   Lack of Transportation (Medical): No   Lack of Transportation (Non-Medical): No  Physical Activity: Insufficiently Active   Days of Exercise per Week: 2 days   Minutes of Exercise per Session: 20 min  Stress: No Stress Concern Present   Feeling of Stress : Not at all  Social Connections: Moderately Integrated   Frequency of Communication with  Friends and Family: More than three times a week   Frequency of Social Gatherings with Friends and Family: More than three times a week   Attends Religious Services: 1 to 4 times per year   Active Member of [employer] or Organizations: Yes   Attends Archivist Meetings: 1 to 4 times per year   Marital Status: Widowed  Human resources officer Violence: Not At Risk   Fear of Current or Ex-Partner: No   Emotionally Abused: No   Physically Abused: No   Sexually Abused: No    FH:  Family History  Problem Relation Age of Onset   Colon polyps Mother    Cancer Father        bladder   Heart disease Father     Past Medical History:  Diagnosis Date   Anal or rectal pain    Anemia    Arthritis    CAD (coronary artery disease)    a. NSTEMI 04/2020 s/p DES to [facility] and D1.   Chronic diastolic CHF (congestive heart failure) (HCC)    Depression    Diverticulosis of colon (without mention of hemorrhage)    Hypertension    Hypoalbuminemia    Internal hemorrhoids without mention of complication    Ischemic heart disease    LBBB (left bundle branch block)    Osteopenia    Renal insufficiency    S/P right hip fracture    Slow transit constipation     Current Outpatient Medications  Medication Sig Dispense Refill   albuterol (VENTOLIN HFA) 108 (90 Base) MCG/ACT inhaler Inhale 1-2 puffs into the lungs every 6 (six) hours as needed for wheezing or shortness of breath. 18 g 0   aspirin 81 MG chewable tablet Chew 81 mg by mouth daily.     bisoprolol (ZEBETA) 5 MG tablet Take 0.5 tablets (2.5 mg total) by mouth daily. 30 tablet 2   Calcium Carbonate-Vit D-Min (CALCIUM 1200 PO) Take 1,200 mg by mouth daily.     cholecalciferol (VITAMIN D) 1000 UNITS tablet Take 1,000 Units by mouth daily.     clopidogrel (PLAVIX) 75 MG tablet Take 1 tablet (75 mg total) by mouth daily. 90 tablet 3   docusate sodium (COLACE) 100 MG capsule Take 100 mg by mouth every other day.     esomeprazole (NEXIUM) 40 MG  capsule Take 1 capsule (40 mg total) by mouth daily. 90 capsule 1   ferrous sulfate 325 (65 FE) MG tablet Take 325 mg by mouth See admin instructions. 3 times a week     furosemide (LASIX) 40 MG tablet Take 0.5 tablets (20 mg total) by mouth daily. 30 tablet 2   latanoprost (XALATAN) 0.005 % ophthalmic solution Place 1 drop into both eyes at bedtime.     multivitamin-iron-minerals-folic acid (CENTRUM) chewable tablet Chew 1 tablet by mouth daily.     ramelteon (  ROZEREM) 8 MG tablet 1 tablet 30 min before bedtime as needed     spironolactone (ALDACTONE) 25 MG tablet Take 0.5 tablets (12.5 mg total) by mouth daily. 15 tablet 0   No current facility-administered medications for this encounter.    Vitals:   12/28/20 1303  BP: 120/64  Pulse: 92  SpO2: 98%  Weight: 54 kg (119 lb)    PHYSICAL EXAM: Cardiac: JVD 10-11cm , normal rate and rhythm, clear s1 and s2, no murmurs, rubs or gallops, 1+ LE edema Pulmonary: bibasilar rales, not in distress Abdominal: non distended abdomen, soft and nontender Psych: Alert, conversant, in good spirits  ECG: NSR rate 87, LBBB, 3 PVC's   ASSESSMENT & PLAN: Combined systolic and diastolic CHF: -44/81/8563 ECHO with hypokinesis of the basal inferior and inferolateral wall, EF 50 to 14%, grade 2 diastolic dysfunction, -LHC 05/08/2020 showing three-vessel obstructive coronary artery disease. Underwent PCI of the mid RCA and first diagonal with drug-eluting stents. -ECHO 12/07/20 EF 25-30%,  mildly reduced RV systolic function.  -R/LHC done on 5/23 with patent prior placed stents in RCA and 1st diag, and otherwise stable CAD, mildly elevated RH pressures -Chronic LBBB, HTN, RWMA on echo likely mixed ischemic/non ischemic CM  -NYHA Class II-III symptoms, weight up four pounds volume overloaded on exam, could not get a clip reading,  -currently taking bisoprolol 2.5, lasix 20mg , spiro 12.5mg  -discussed we will need to diurese her and also we could initiate  sglt2i therapy to help but she declined this.   -we will increase her lasix to 40mg  BID starting this afternoon and she will increase to 40mg  daily on Monday.  She lives alone, [facility] will check on her make sure she is diuresing appropriately and check labs -optimize her GDMT as much as possible, hopefully we can see improvement with this alone she would like to avoid any further procedures if possible.    CAD:  -R/LHC done on 5/23 with patent prior placed stents in RCA and 1st diag, and otherwise stable CAD -Continue DAPT, statin  Iron Deficiency Anemia: -recent severe epistaxis episode -iron studies suggestive of iron deficiency, we will arrange IV iron infusion  PVC's: -could be due to sleep deprivation, may have OSA, will need to follow   Insomnia: -pcp trying different sleep aids -I will diurese her but unsure how much this will help given what she is telling me -we discussed her poor sleep hygiene she seems reluctant to change this  FU appointment TBD pending report from remote health

## 2020-12-29 DIAGNOSIS — N1831 Chronic kidney disease, stage 3a: Secondary | ICD-10-CM | POA: Diagnosis not present

## 2020-12-29 DIAGNOSIS — I5042 Chronic combined systolic (congestive) and diastolic (congestive) heart failure: Secondary | ICD-10-CM | POA: Diagnosis not present

## 2020-12-29 DIAGNOSIS — R0601 Orthopnea: Secondary | ICD-10-CM | POA: Diagnosis not present

## 2020-12-29 NOTE — Addendum Note (Signed)
Encounter addended by: Katherine Roan, MD on: 12/29/2020 10:53 AM  Actions taken: Clinical Note Signed

## 2020-12-30 DIAGNOSIS — I130 Hypertensive heart and chronic kidney disease with heart failure and stage 1 through stage 4 chronic kidney disease, or unspecified chronic kidney disease: Secondary | ICD-10-CM | POA: Diagnosis not present

## 2020-12-30 DIAGNOSIS — I5042 Chronic combined systolic (congestive) and diastolic (congestive) heart failure: Secondary | ICD-10-CM | POA: Diagnosis not present

## 2020-12-30 DIAGNOSIS — N1831 Chronic kidney disease, stage 3a: Secondary | ICD-10-CM | POA: Diagnosis not present

## 2020-12-30 DIAGNOSIS — I5033 Acute on chronic diastolic (congestive) heart failure: Secondary | ICD-10-CM | POA: Diagnosis not present

## 2020-12-31 DIAGNOSIS — I5041 Acute combined systolic (congestive) and diastolic (congestive) heart failure: Secondary | ICD-10-CM | POA: Diagnosis not present

## 2021-01-01 ENCOUNTER — Other Ambulatory Visit: Payer: Self-pay | Admitting: *Deleted

## 2021-01-01 ENCOUNTER — Other Ambulatory Visit: Payer: Self-pay

## 2021-01-01 DIAGNOSIS — I5041 Acute combined systolic (congestive) and diastolic (congestive) heart failure: Secondary | ICD-10-CM | POA: Diagnosis not present

## 2021-01-01 NOTE — Patient Outreach (Signed)
[facility]) Care Management  01/01/2021  Patricia Garcia [birth year] [id number]  Telephone Assessment/Screen for post hospital/complex care referral   Referral Date: 12/12/20 Referral Source: [facility] liaison Referral Reason: complex care and disease management follow up calls and assess for further needs.SDOH - patient requested [facility] meals Insurance: NextGen Medicare and blue cross and blue shield supplement Last admission 12/06/20-12/11/20-acute on chronic diastolic HF   Outreach successful Patient is able to verify HIPAA, DOB and address Reviewed and addressed referral to [facility] with patient Consent: [facility] RN CM reviewed [facility] services with patient. Patient gave verbal consent for services [facility] telephonic RN CM.   Assessment Has slept well for the last four days [facility] Dr Hazle Coca (remote) seen on 12/21/20 Voices she is aware she has been having panic attacks Today reports she is no longer having panic attacks that were also related to nightly GERD (gastroesophageal reflux disease) symptoms New treatment plan- Nexium bid and lasix increased x 3 days is providing improved symptoms  She is no longer short of breath Now having Remote nurse home visits-had a visit today  Received her meals with a good variety but had too many- resolved  Appetite  better wt now at 113 lbs  BP 110/60 98% 84   Now attending the heart impact clinic Kennyth Lose SW) First day was on 12/26/20 She completed her cardiac rehab sessions  Plans Patient agrees to care plan and follow up within the next 30 business days Pt encouraged to return a call to [facility] RN CM prn   Goals Addressed               This Visit's Progress     Patient Stated     [facility]) Eat Healthy (pt-stated)   On track     Timeframe:  Short-Term Goal Priority:  High Start Date:                      12/14/20       Expected End Date:      02/15/21                 Follow Up Date  01/31/21 Barriers: Knowledge    - drink 6 to 8 glasses of water each day - set a realistic goal  -increase frequency of meals     Notes:  01/01/21 wt 113 lbs was 118 lb in May 2022 denies edema, has active meals delivery services now, attempting to improve intake On new GERD treatment plan, resting better  12/14/20 foods not on her low sodium diet removed by her daughter request [facility] meals Referral to [facility] care guide completed       [facility]) Track and Manage Fluids and Swelling-Heart Failure (pt-stated)   On track     Timeframe:  Long-Range Goal Priority:  High Start Date:            12/14/20                  Expected End Date:               03/20/21        Follow Up Date 01/31/21 Barriers: Knowledge    - call office if I gain more than 2 pounds in one day or 5 pounds in one week - use salt in moderation - watch for swelling in feet, ankles and legs every day - weigh myself daily   Notes:  01/01/21  wt 113 lbs denies edema, has active meals delivery services now, attempting to improve intake Recently had a 3 day increase in lasix  12/14/20 has not weighed today Left hospital weighing 118 lbs, food removed from her home by her daughter, Pt request SDOH food insecurity assistance Referral to [facility] care guide          Mccall Will L. Lavina Hamman, RN, BSN, [place] Coordinator Office number [phone] Main [facility] number [phone] Fax number [phone]

## 2021-01-07 ENCOUNTER — Telehealth (HOSPITAL_COMMUNITY): Payer: Self-pay

## 2021-01-07 DIAGNOSIS — F419 Anxiety disorder, unspecified: Secondary | ICD-10-CM | POA: Diagnosis not present

## 2021-01-07 DIAGNOSIS — H409 Unspecified glaucoma: Secondary | ICD-10-CM | POA: Diagnosis not present

## 2021-01-07 DIAGNOSIS — I5033 Acute on chronic diastolic (congestive) heart failure: Secondary | ICD-10-CM | POA: Diagnosis not present

## 2021-01-07 DIAGNOSIS — I214 Non-ST elevation (NSTEMI) myocardial infarction: Secondary | ICD-10-CM | POA: Diagnosis not present

## 2021-01-07 DIAGNOSIS — D649 Anemia, unspecified: Secondary | ICD-10-CM | POA: Diagnosis not present

## 2021-01-07 NOTE — Telephone Encounter (Signed)
Called pt to inform her of IV iron appt for 7/7. Left message and provided call back number for short stay infusion clinic.

## 2021-01-17 ENCOUNTER — Ambulatory Visit (INDEPENDENT_AMBULATORY_CARE_PROVIDER_SITE_OTHER): Payer: Medicare Other | Admitting: Family

## 2021-01-17 ENCOUNTER — Other Ambulatory Visit: Payer: Self-pay

## 2021-01-17 ENCOUNTER — Encounter: Payer: Self-pay | Admitting: Family

## 2021-01-17 VITALS — BP 120/74 | HR 84 | Ht 62.0 in | Wt 113.4 lb

## 2021-01-17 DIAGNOSIS — I447 Left bundle-branch block, unspecified: Secondary | ICD-10-CM | POA: Diagnosis not present

## 2021-01-17 DIAGNOSIS — I25118 Atherosclerotic heart disease of native coronary artery with other forms of angina pectoris: Secondary | ICD-10-CM | POA: Diagnosis not present

## 2021-01-17 DIAGNOSIS — D509 Iron deficiency anemia, unspecified: Secondary | ICD-10-CM | POA: Diagnosis not present

## 2021-01-17 DIAGNOSIS — I10 Essential (primary) hypertension: Secondary | ICD-10-CM

## 2021-01-17 DIAGNOSIS — I5042 Chronic combined systolic (congestive) and diastolic (congestive) heart failure: Secondary | ICD-10-CM | POA: Diagnosis not present

## 2021-01-17 DIAGNOSIS — I493 Ventricular premature depolarization: Secondary | ICD-10-CM

## 2021-01-17 MED ORDER — FUROSEMIDE 40 MG PO TABS: 40.0000 mg | ORAL_TABLET | Freq: Every day | ORAL | 2 refills | Status: DC

## 2021-01-17 MED ORDER — BISOPROLOL FUMARATE 5 MG PO TABS: 2.5000 mg | ORAL_TABLET | Freq: Every day | ORAL | 2 refills | Status: DC

## 2021-01-17 MED ORDER — SPIRONOLACTONE 25 MG PO TABS: 12.5000 mg | ORAL_TABLET | Freq: Every day | ORAL | 2 refills | Status: DC

## 2021-01-17 NOTE — Patient Instructions (Addendum)
Medication Instructions:  Continue your current  medications  *If you need a refill on your cardiac medications before your next appointment, please call your pharmacy*  Lab Work: None ordered today.   Testing/Procedures: None ordered today   Follow-Up: At [facility], you and your health needs are our priority.  As part of our continuing mission to provide you with exceptional heart care, we have created designated Provider Care Teams.  These Care Teams include your primary Cardiologist (physician) and Advanced Practice Providers (APPs -  Physician Assistants and Nurse Practitioners) who all work together to provide you with the care you need, when you need it.  We recommend signing up for the patient portal called "MyChart".  Sign up information is provided on this After Visit Summary.  MyChart is used to connect with patients for Virtual Visits (Telemedicine).  Patients are able to view lab/test results, encounter notes, upcoming appointments, etc.  Non-urgent messages can be sent to your provider as well.   To learn more about what you can do with MyChart, go to [email or web address].    Your next appointment:   2 month(s)  8/31 ARRIVE AT 11:30   The format for your next appointment:   In Person  Provider:   You may see Freada Bergeron, MD or one of the following Advanced Practice Providers on your designated Care Team:   Richardson Dopp, PA-C Vin Geyser, [place]  Other Instructions  Heart Healthy Diet Recommendations: A low-salt diet is recommended. Meats should be grilled, baked, or boiled. Avoid fried foods. Focus on lean protein sources like fish or chicken with vegetables and fruits. The American Heart Association is a Microbiologist!  American Heart Association Diet and Lifeystyle Recommendations   Exercise recommendations: The American Heart Association recommends 150 minutes of moderate intensity exercise weekly. Try 30 minutes of moderate intensity exercise 4-5  times per week. This could include walking, jogging, or swimming.  Recommend weighing daily and keeping a log. Please call our office if you have weight gain of 2 pounds overnight or 5 pounds in 1 week.   To prevent palpitations: Make sure you are adequately hydrated.  Avoid and/or limit caffeine containing beverages like soda or tea. Exercise regularly.  Manage stress well. Some over the counter medications can cause palpitations such as Benadryl, AdvilPM, TylenolPM. Regular Advil or Tylenol do not cause palpitations.

## 2021-01-17 NOTE — Progress Notes (Signed)
Office Visit    Patient Name: Patricia Garcia Date of Encounter: 01/17/2021  PCP:  Lajean Manes, [place]  Cardiologist:  Freada Bergeron, MD  Advanced Practice Provider:  No care team member to display Electrophysiologist:  None    Chief Complaint    Patricia Garcia is a 87 y.o. female with a hx of hypertension, CAD s/p DESx2 45/0388, systolic and diastolic heart failure, depression, chronic left bundle branch block, anemia presents today for heart failure follow-up  Past Medical History    Past Medical History:  Diagnosis Date   Anal or rectal pain    Anemia    Arthritis    CAD (coronary artery disease)    a. NSTEMI 04/2020 s/p DES to [facility] and D1.   Chronic diastolic CHF (congestive heart failure) (HCC)    Depression    Diverticulosis of colon (without mention of hemorrhage)    Hypertension    Hypoalbuminemia    Internal hemorrhoids without mention of complication    Ischemic heart disease    LBBB (left bundle branch block)    Osteopenia    Renal insufficiency    S/P right hip fracture    Slow transit constipation    Past Surgical History:  Procedure Laterality Date   ABDOMINAL HYSTERECTOMY     CARDIAC CATHETERIZATION     CATARACT EXTRACTION, BILATERAL     CORONARY STENT INTERVENTION N/A 05/08/2020   Procedure: CORONARY STENT INTERVENTION;  Surgeon: Martinique, Peter M, MD;  Location: [place] [facility];  Service: Cardiovascular;  Laterality: N/A;   LEFT HEART CATH AND CORONARY ANGIOGRAPHY N/A 05/08/2020   Procedure: LEFT HEART CATH AND CORONARY ANGIOGRAPHY;  Surgeon: Martinique, Peter M, MD;  Location: [place] [facility];  Service: Cardiovascular;  Laterality: N/A;   MASS EXCISION  06/10/2011   Procedure: EXCISION MASS;  Surgeon: Wynonia Sours, MD;  Location: [place];  Service: Orthopedics;  Laterality: Right;  excision cyst right thumb IP, debridement IP right thumb   RIGHT/LEFT HEART CATH AND CORONARY  ANGIOGRAPHY N/A 12/10/2020   Procedure: RIGHT/LEFT HEART CATH AND CORONARY ANGIOGRAPHY;  Surgeon: Troy Sine, MD;  Location: [place] [facility];  Service: Cardiovascular;  Laterality: N/A;   TONSILLECTOMY     TOTAL HIP ARTHROPLASTY Right 05/02/2020   Procedure: TOTAL HIP ARTHROPLASTY ANTERIOR APPROACH;  Surgeon: Rod Can, MD;  Location: [facility];  Service: Orthopedics;  Laterality: Right;    Allergies  Allergies  Allergen Reactions   Alendronate Sodium     Other reaction(s): gi sx Other reaction(s): gi sx   Doxycycline Nausea And Vomiting    12/04/20 Pt prefers not to receive Doxycycline for infections   Linaclotide     Other reaction(s): Intolerance Other reaction(s): Intolerance   Other     Other reaction(s): unrecalled   Penicillamine     Other reaction(s): unrecalled Other reaction(s): unrecalled   Penicillins Other (See Comments)    unknown   Prednisone Other (See Comments)    unknown Other reaction(s): irritable and shaking Other reaction(s): irritable and shaking   Risedronate     Other reaction(s): heart burn Other reaction(s): heart burn   Statins Other (See Comments)    shaking   Statins Support [A-G Pro]     Other reaction(s): visual changes, general aches   Statins Support [Acid Blockers Support]     Other reaction(s): visual changes, general aches   Sulfa Antibiotics     Other reaction(s):  unrecalled   Sulfonamide Derivatives Other (See Comments)    unknown   Tramadol Other (See Comments)    unknown    History of Present Illness    Patricia Garcia is a 87 y.o. female with a hx of hypertension, CAD s/p DESx2 16/1096, systolic and diastolic heart failure, depression, chronic left bundle branch block, anemia last seen by heart and vascular transitions of care clinic 12/28/20.  Previous nuclear stress test February 2021 was low risk.  She had mechanical fall resulting in hip fracture requiring surgery October 2021.  She was readmitted 5 days later  with nausea, vomiting, hypoxia found to have NSTEMI and acute on chronic diastolic heart failure.  2D echo 05/07/2020 hypokinesis of the basal inferior and inferolateral wall, EF 50 to 04%, grade 2 diastolic dysfunction, elevated left atrial pressure, mild MR.  She underwent cardiac cath 05/08/2020 showing three-vessel obstructive coronary artery disease.  Underwent PCI of the mid RCA and first diagonal with DES.  She has been working with cardiac rehab doing overall well until presenting to the ED-9/20 with nausea, vomiting, shortness of breath.  Chest x-ray with small bilateral pleural effusions.  BNP elevated 1701.  At bedtime troponin negative x2.  She was admitted for CHF exacerbation.  Echo 12/07/2020 with LVEF 25-30%, mildly reduced RV SF.  Right and left heart cath 12/10/2020 with patent prior placement in RCA and first diagonal and otherwise stable CAD, mildly elevated right heart pressures.  She was diuresed from 123 to 118 pounds.  She was seen and followed by heart and vascular transitions of care clinic 12/21/2020 at which time her Lasix was reduced to 20 mg daily and spironolactone 12.5 mg daily initiated.  She was seen 12/28/2020 with chief complaint of insomnia and increasing dyspnea on exertion that was noted to be drinking substantial amounts of fluid.  She declined SGLT2 I.  Her Lasix was increased to 40 mg twice daily for 3 days then transition to 40 mg daily.  She was arranged for IV iron infusion given iron deficiency anemia.  She presents today for follow-up. Reports no shortness of breath nor dyspnea on exertion. Reports no chest pain, pressure, or tightness. No edema, orthopnea, PND. Reports no palpitations. Has been staying busy doing housework. She tells me she is very pleased with the care she receive from the heart and vascular transitions of care clinic. She has been following very closely with [facility] and very much enjoys them. Monitoring weight at home with stable weight at  112 lbs by home scale.    EKGs/Labs/Other Studies Reviewed:   The following studies were reviewed today:  EKG: No EKG today  Recent Labs: 12/06/2020: ALT 44 12/07/2020: Magnesium 1.8 12/21/2020: B Natriuretic Peptide 1,840.3; BUN 20; Creatinine, Ser 1.14; Hemoglobin 11.0; Platelets 391; Potassium 3.7; Sodium 139  Recent Lipid Panel    Component Value Date/Time   CHOL 180 12/10/2020 0511   CHOL 257 (H) 07/16/2020 0904   TRIG 147 12/10/2020 0511   HDL 40 (L) 12/10/2020 0511   HDL 49 07/16/2020 0904   CHOLHDL 4.5 12/10/2020 0511   VLDL 29 12/10/2020 0511   LDLCALC 111 (H) 12/10/2020 0511   LDLCALC 163 (H) 07/16/2020 0904   Home Medications   No outpatient medications have been marked as taking for the 01/17/21 encounter (Appointment) with Loel Dubonnet, NP.     Review of Systems   All other systems reviewed and are otherwise negative except as noted above.  Physical Exam  VS:  There were no vitals taken for this visit. , BMI There is no height or weight on file to calculate BMI.  Wt Readings from Last 3 Encounters:  12/28/20 119 lb (54 kg)  12/21/20 116 lb 12.8 oz (53 kg)  12/11/20 118 lb 9.7 oz (53.8 kg)     GEN: Well nourished, well developed, in no acute distress. HEENT: normal. Neck: Supple, no JVD, carotid bruits, or masses. Cardiac: RRR, no murmurs, rubs, or gallops. No clubbing, cyanosis, edema.  Radials/PT 2+ and equal bilaterally.  Respiratory:  Respirations regular and unlabored, clear to auscultation bilaterally. GI: Soft, nontender, nondistended. MS: No deformity or atrophy. Skin: Warm and dry, no rash. Neuro:  Strength and sensation are intact. Psych: Normal affect.  Assessment & Plan    Combined systolic and diastolic heart failure- Echo 04/2020 LVEF 50-55% ? Echo 12/07/20 LVEF 25-30%, mildly elevated right heart pressure. Euvolemic and well compensated on exam. GDMT includes Aldactone 12.5mg  QD, Lasix 40mg  QD, Bisoprolol 2.5mg  QD. Refills  provided. BP will not allow for addition of ACE/ARB/ARNI. Normal renal function by lab work 12/21/20. Discussed addition of SGLT2i, she prefers to defer additional medication changes at this time. Consider ordering repeat echo at follow up to reassess LVEF after 3 months of therapy. Continued daily weights, low salt diet and fluid restriction encouraged.  CAD- [facility] 12/10/20 prior stent to RCA and 1st diagonal patent. Stable with no anginal symptoms. No indication for ischemic evaluation. GDMT includes Aspirin, Plavix, Bisoprolol. Lipid management via diet and exercise as intolerant of statin. Future considerations include addition of Zetia.   Iron deficiency anemia- Iron infusion scheduled for 01/24/21.   PVC- notes occasional palpitations. Preventative measures discussed. Continue present dose Bisoprolol.  LBBB - Known history. No lightheadedness, near syncope, syncope. Monitor with periodic EKG.   Disposition: Follow up in 2 month(s) with Dr. Johney Frame or APP  Signed, Loel Dubonnet, NP 01/17/2021, 7:57 AM [place]

## 2021-01-22 ENCOUNTER — Other Ambulatory Visit: Payer: Self-pay | Admitting: *Deleted

## 2021-01-22 NOTE — Patient Outreach (Addendum)
[facility]) Care Management  01/22/2021  Patricia Garcia 1932-11-30 [id number]   [place] coordination- Return call to pt, upcoming appointment  Pt left  message reporting she needed assistance with a clarification of an appointment "on my cell phone call yesterday it [facility] and said I have an appointment on July seventh at nine o'clock" EPIC indicates she has a January 24 2021 appointment for [place] [facility] for Feraheme 1 of 2 injections   Returned a call to patient Patient is able to verify HIPAA ([place] and Accountability Act) identifiers  Consent: [facility] RN CM reviewed [facility] services with patient. Patient gave verbal consent for services.  [name] reports she has called the number on her cell phone back and left a message but no return call back and she thinks it was an incorrect number   [facility] RN CM discussed what could be seen on EPIC with [facility] RN CM's access She recalls Dr Shan Levans ordering the infusions. She reports she has spoken with her daughter who encourages her to go to the appointment, [facility] RN CM also encouraged her to follow her doctor's orders and complete the infusions as she continues to report she is having difficulty sleeping, being tired Ferritin on 12/21/20 179 was 231 on 05/07/20 Hemoglobin in may & June range from 10-11.2 (12-15 normal)  She states with the encouragement of her daughter and [facility] RN CM she will attend this appointment [facility] providers continue to visit her and follow up via telephone  She inquires about the location of the appointment but [facility] RN CM does not have access to the location of the [facility]  She was offered the main [place] number to inquire about the location from a hospital operator but she informed [facility] RN CM she already has the number  She also reports she will attempt to go to the hospital tomorrow to see if someone can help her  locate the infusion center so she will know the location prior to her 0900 01/24/21 appointment.  Updates Dental procedure next week is discussed by her when she states she has lots to do   Plan Follow up with [name] in the next 7-10 business days   [place] L. Lavina Hamman, RN, BSN, [place] Coordinator Office number [phone] Mobile number [phone]  Main [facility] number [phone] Fax number [phone]

## 2021-01-24 ENCOUNTER — Other Ambulatory Visit: Payer: Self-pay

## 2021-01-24 ENCOUNTER — Encounter (HOSPITAL_COMMUNITY)
Admission: RE | Admit: 2021-01-24 | Discharge: 2021-01-24 | Disposition: A | Discharge status: Home / Self Care | Payer: Medicare Other | Source: Ambulatory Visit | Attending: Cardiology | Admitting: Cardiology

## 2021-01-24 DIAGNOSIS — D649 Anemia, unspecified: Secondary | ICD-10-CM

## 2021-01-24 MED ORDER — SODIUM CHLORIDE 0.9 % IV SOLN
510.0000 mg | Freq: Once | INTRAVENOUS | Status: AC
  Administered 2021-01-24: 510 mg via INTRAVENOUS
  Filled 2021-01-24: qty 510

## 2021-01-30 DIAGNOSIS — Z961 Presence of intraocular lens: Secondary | ICD-10-CM | POA: Diagnosis not present

## 2021-01-30 DIAGNOSIS — H401131 Primary open-angle glaucoma, bilateral, mild stage: Secondary | ICD-10-CM | POA: Diagnosis not present

## 2021-01-30 DIAGNOSIS — H5213 Myopia, bilateral: Secondary | ICD-10-CM | POA: Diagnosis not present

## 2021-01-30 DIAGNOSIS — H18593 Other hereditary corneal dystrophies, bilateral: Secondary | ICD-10-CM | POA: Diagnosis not present

## 2021-01-31 ENCOUNTER — Other Ambulatory Visit: Payer: Self-pay

## 2021-01-31 ENCOUNTER — Other Ambulatory Visit: Payer: Self-pay | Admitting: *Deleted

## 2021-01-31 ENCOUNTER — Encounter (HOSPITAL_COMMUNITY): Payer: Medicare Other

## 2021-01-31 NOTE — Patient Outreach (Signed)
[facility]) Care Management  01/31/2021  Patricia Garcia 1932-10-17 [id number]   [facility] outreach to complex care patient Referral Date: 12/12/20 Referral Source: [facility] liaison Referral Reason: complex care and disease management follow up calls and assess for further needs.SDOH - patient requested [facility] meals Insurance: Next[employer] Medicare and blue cross and blue shield supplement Last admission 12/06/20-12/11/20-acute on chronic diastolic HF   Outreach successful Patient is able to verify HIPAA, DOB and address Reviewed and addressed referral to [facility] with patient Consent: [facility] RN CM reviewed [facility] services with patient. Patient gave verbal consent for services [facility] telephonic RN CM.Referral Date: 12/12/20 Referral Source: [facility] liaison Referral Reason: complex care and disease management follow up calls and assess for further needs.SDOH - patient requested [facility] meals Insurance: [employer] Medicare and blue cross and blue shield supplement Last admission 12/06/20-12/11/20-acute on chronic diastolic HF   Outreach successful Patient is able to verify HIPAA, DOB and address Reviewed and addressed referral to [facility] with patient Consent: [facility] RN CM reviewed [facility] services with patient. Patient gave verbal consent for services [name] telephonic RN CM.  Patient is able to verify HIPAA ([name] and Accountability Act) identifiers   Consent: [facility] RN CM reviewed [facility] services with patient. Patient gave verbal consent for services  Assessment She is doing well and continues to work with [facility] better She did go for her iron infusion She reports it was not as bad as she had worried about She confirms she now has so much energy  Doing well using warm salt water in mouth and warm compresses to face on liquid diet  She went to [place] and grocery shopping today  Tuesday 01/29/21 had tooth pulled and prepare for implant "flipper  tooth" Face swollen purple bruise from nose to chin Saw eye MD on 01/30/21   Anxiety She confirms she does not have a counselor/therapist and presently does not  prefer to obtain one History of anxiety/panic attacks She reports her anxiety is related to situational issues that have continued throughout this year (like her cardiac symptoms, reaction to medications, recent accident of grand daughter)  She reports she walks when she feels anxious, leaves her front door open to prevent the feeling of isolation and to let sunshine in, talking to a cousin, wanting to read more when she get her new glasses.  Theses are her interventions to manage her anxiety Getting more sleep, she reports helps also RN CM encouraged her to be open to follow up care, speaking with a counselor about tools to assist further with her ongoing anxiety Encourage her to focus on relaxation methods like reflection, deep breathing, and muscle relaxation  using processes like yoga, exercise, and tai chi. Provided her with the senior resources of [place], [name] [phone]  Nutrition Present weight at 110 lbs  intake of protein to include boost, peanut butter, activa yogurt (vanilla), blended soups, ice cream, potato  Discussed importance of increasing weight to prevent malnutrition  Plan Follow up with [name] in the next 30 business days  Goals Addressed               This Visit's Progress     Patient Stated     [facility]) Eat Healthy (pt-stated)   On track     Timeframe:  Long-Range Goal Priority:  High Start Date:  12/14/20       Expected End Date:      03/20/21                 Follow Up Date 03/04/21 Barriers: Knowledge   - drink 6 to 8 glasses of water each day - prepare main meal at home 3 to 5 days each week - set a realistic goal  -increase frequency of meals with protein    Notes:  01/31/21 continues to lose weight now at 110 lbs intake of  protein to include boost, peanut butter, activa yogurt (vanilla), blended soups 01/01/21 wt 113 lbs was 118 lb in May 2022 denies edema, has active meals delivery services now, attempting to improve intake On new GERD treatment plan, resting better  12/14/20 foods not on her low sodium diet removed by her daughter request [facility] meals Referral to [facility] care guide completed      [facility]) Track and Manage Fluids and Swelling-Heart Failure (pt-stated)   On track     Timeframe:  Long-Range Goal Priority:  High Start Date:            12/14/20                  Expected End Date:               03/20/21        Follow Up Date 02/1521 Barriers: Knowledge   - call office if I gain more than 2 pounds in one day or 5 pounds in one week - use salt in moderation - watch for swelling in feet, ankles and legs every day - weigh myself daily    Notes:  01/31/21 no swelling recently wt today 110 lbs 01/01/21 wt 113 lbs denies edema, has active meals delivery services now, attempting to improve intake Recently had a 3 day increase in lasix  12/14/20 has not weighed today Left hospital weighing 118 lbs, food removed from her home by her daughter, Pt request SDOH food insecurity assistance Referral to [facility] care guide        Darly Massi L. Lavina Hamman, RN, BSN, [place] Coordinator Office number [phone] Main [facility] number [phone] Fax number [phone]

## 2021-02-08 ENCOUNTER — Other Ambulatory Visit: Payer: Self-pay

## 2021-02-08 MED ORDER — ESOMEPRAZOLE MAGNESIUM 40 MG PO CPDR: 40.0000 mg | DELAYED_RELEASE_CAPSULE | Freq: Every day | ORAL | 1 refills | Status: DC

## 2021-02-08 NOTE — Telephone Encounter (Signed)
Rx(s) sent to pharmacy electronically.  

## 2021-03-04 ENCOUNTER — Other Ambulatory Visit: Payer: Self-pay | Admitting: *Deleted

## 2021-03-04 ENCOUNTER — Other Ambulatory Visit: Payer: Self-pay

## 2021-03-04 NOTE — Patient Outreach (Signed)
[facility]) Care Management  03/04/2021  Patricia Garcia 24-May-1933 [id number]   [facility] outreach to complex care patient Referral Date: 12/12/20 Referral Source: [facility] liaison Referral Reason: complex care and disease management follow up calls and assess for further needs.SDOH - patient requested [facility] meals Insurance: NextGen Medicare and blue cross and blue shield supplement Last admission 12/06/20-12/11/20-acute on chronic diastolic HF   Patient is able to verify HIPAA identifiers   Assessment  Weight remains stable at 109-110 lbs Returns to oral surgeon on next week so anticipates she will be able to eat better Discussed her fluid intake to assist with constipation She is now compiled a notebook of her medical history, medicines, etc  She will be joining Insurance account manager provided with her changes in her home action plans  Still having some sleeping issues but has started on action plans like getting out with her granddaughter, getting involved with [place] senior resources  Plan Follow up with Patricia Garcia in the next 28 business days Sent patient a  my chart activation via text and e-mail   Goals Addressed               This Visit's Progress     Patient Stated     [facility]) Eat Healthy (pt-stated)   On track     Timeframe:  Long-Range Goal Priority:  High Start Date:                      12/14/20       Expected End Date:      11/31/22                 Follow Up Date 06/04/21 Barriers: Knowledge    - drink 6 to 8 glasses of water each day - prepare main meal at home 3 to 5 days each week - set a realistic goal  -increase frequency of meals with protein   Notes:  03/04/21 Reports she is eating better Weight remains stable at 109-110 lbs Continues to work with [facility] 01/31/21 continues to lose weight now at 110 lbs intake of protein to include boost, peanut butter, activa yogurt (vanilla), blended soups 01/01/21 wt  113 lbs was 118 lb in May 2022 denies edema, has active meals delivery services now, attempting to improve intake On new GERD treatment plan, resting better  12/14/20 foods not on her low sodium diet removed by her daughter request [facility] meals Referral to [facility] care guide completed      COMPLETED: [facility]) Track and Manage Fluids and Swelling-Heart Failure (pt-stated)   On track     Timeframe:  Long-Range Goal Priority:  High Start Date:            12/14/20                  Expected End Date:               03/20/21        Follow Up Date 02/1521 Barriers: Knowledge    - call office if I gain more than 2 pounds in one day or 5 pounds in one week - use salt in moderation - watch for swelling in feet, ankles and legs every day - weigh myself daily   Notes:  03/04/21 She denies swelling Weight remains stable at 109-110 lbs resolved 01/31/21 no swelling recently wt today 110 lbs 01/01/21 wt 113 lbs denies edema, has active meals delivery services  now, attempting to improve intake Recently had a 3 day increase in lasix  12/14/20 has not weighed today Left hospital weighing 118 lbs, food removed from her home by her daughter, Pt request SDOH food insecurity assistance Referral to [facility] care guide         Patricia Garcia L. Lavina Hamman, RN, BSN, [place] Coordinator Office number [phone] Main [facility] number [phone] Fax number [phone]

## 2021-03-13 ENCOUNTER — Emergency Department (HOSPITAL_COMMUNITY)
Admission: EM | Admit: 2021-03-13 | Discharge: 2021-03-13 | Disposition: A | Discharge status: Home / Self Care | Payer: Medicare Other | Attending: Emergency Medicine | Admitting: Emergency Medicine

## 2021-03-13 ENCOUNTER — Emergency Department (HOSPITAL_COMMUNITY): Payer: Medicare Other

## 2021-03-13 ENCOUNTER — Telehealth: Payer: Self-pay | Admitting: Physician Assistant

## 2021-03-13 ENCOUNTER — Encounter (HOSPITAL_COMMUNITY): Payer: Self-pay | Admitting: Emergency Medicine

## 2021-03-13 DIAGNOSIS — I2510 Atherosclerotic heart disease of native coronary artery without angina pectoris: Secondary | ICD-10-CM | POA: Insufficient documentation

## 2021-03-13 DIAGNOSIS — J449 Chronic obstructive pulmonary disease, unspecified: Secondary | ICD-10-CM | POA: Diagnosis not present

## 2021-03-13 DIAGNOSIS — Z7982 Long term (current) use of aspirin: Secondary | ICD-10-CM | POA: Diagnosis not present

## 2021-03-13 DIAGNOSIS — Z79899 Other long term (current) drug therapy: Secondary | ICD-10-CM | POA: Diagnosis not present

## 2021-03-13 DIAGNOSIS — Z955 Presence of coronary angioplasty implant and graft: Secondary | ICD-10-CM | POA: Insufficient documentation

## 2021-03-13 DIAGNOSIS — I5033 Acute on chronic diastolic (congestive) heart failure: Secondary | ICD-10-CM | POA: Diagnosis not present

## 2021-03-13 DIAGNOSIS — R079 Chest pain, unspecified: Secondary | ICD-10-CM

## 2021-03-13 DIAGNOSIS — R202 Paresthesia of skin: Secondary | ICD-10-CM | POA: Diagnosis not present

## 2021-03-13 DIAGNOSIS — Z91040 Latex allergy status: Secondary | ICD-10-CM | POA: Diagnosis not present

## 2021-03-13 DIAGNOSIS — R0789 Other chest pain: Secondary | ICD-10-CM | POA: Diagnosis not present

## 2021-03-13 DIAGNOSIS — Z87891 Personal history of nicotine dependence: Secondary | ICD-10-CM | POA: Diagnosis not present

## 2021-03-13 DIAGNOSIS — Z96641 Presence of right artificial hip joint: Secondary | ICD-10-CM | POA: Insufficient documentation

## 2021-03-13 DIAGNOSIS — N1831 Chronic kidney disease, stage 3a: Secondary | ICD-10-CM | POA: Insufficient documentation

## 2021-03-13 DIAGNOSIS — I130 Hypertensive heart and chronic kidney disease with heart failure and stage 1 through stage 4 chronic kidney disease, or unspecified chronic kidney disease: Secondary | ICD-10-CM | POA: Insufficient documentation

## 2021-03-13 LAB — COMPREHENSIVE METABOLIC PANEL
ALT: 19 U/L (ref 0–44)
AST: 43 U/L — ABNORMAL HIGH (ref 15–41)
Albumin: 3.9 g/dL (ref 3.5–5.0)
Alkaline Phosphatase: 122 U/L (ref 38–126)
Anion gap: 10 (ref 5–15)
BUN: 21 mg/dL (ref 8–23)
CO2: 26 mmol/L (ref 22–32)
Calcium: 9.7 mg/dL (ref 8.9–10.3)
Chloride: 100 mmol/L (ref 98–111)
Creatinine, Ser: 0.95 mg/dL (ref 0.44–1.00)
GFR, Estimated: 59 mL/min — ABNORMAL LOW (ref >60)
Glucose, Bld: 106 mg/dL — ABNORMAL HIGH (ref 70–99)
Potassium: 4.1 mmol/L (ref 3.5–5.1)
Sodium: 136 mmol/L (ref 135–145)
Total Bilirubin: 1 mg/dL (ref 0.3–1.2)
Total Protein: 7.4 g/dL (ref 6.5–8.1)

## 2021-03-13 LAB — CBC WITH DIFFERENTIAL/PLATELET
Abs Immature Granulocytes: 0.02 10*3/uL (ref 0.00–0.07)
Basophils Absolute: 0.1 10*3/uL (ref 0.0–0.1)
Basophils Relative: 1 %
Eosinophils Absolute: 0.2 10*3/uL (ref 0.0–0.5)
Eosinophils Relative: 2 %
HCT: 40.1 % (ref 36.0–46.0)
Hemoglobin: 13.2 g/dL (ref 12.0–15.0)
Immature Granulocytes: 0 %
Lymphocytes Relative: 28 %
Lymphs Abs: 2.4 10*3/uL (ref 0.7–4.0)
MCH: 28.2 pg (ref 26.0–34.0)
MCHC: 32.9 g/dL (ref 30.0–36.0)
MCV: 85.7 fL (ref 80.0–100.0)
Monocytes Absolute: 0.6 10*3/uL (ref 0.1–1.0)
Monocytes Relative: 7 %
Neutro Abs: 5.3 10*3/uL (ref 1.7–7.7)
Neutrophils Relative %: 62 %
Platelets: 352 10*3/uL (ref 150–400)
RBC: 4.68 MIL/uL (ref 3.87–5.11)
RDW: 14.9 % (ref 11.5–15.5)
WBC: 8.7 10*3/uL (ref 4.0–10.5)
nRBC: 0 % (ref 0.0–0.2)

## 2021-03-13 LAB — BRAIN NATRIURETIC PEPTIDE: B Natriuretic Peptide: 398.8 pg/mL — ABNORMAL HIGH (ref 0.0–100.0)

## 2021-03-13 LAB — TROPONIN I (HIGH SENSITIVITY)
Troponin I (High Sensitivity): 13 ng/L (ref <18)
Troponin I (High Sensitivity): 13 ng/L (ref <18)

## 2021-03-13 NOTE — ED Triage Notes (Signed)
Pt here from home with c/o chest pain that started yesterday and off and on till today , no sob no n/v 2 stenets placed last oct 2021

## 2021-03-13 NOTE — ED Provider Notes (Signed)
Emergency Medicine Provider Triage Evaluation Note  Patricia Garcia , a 87 y.o. female  was evaluated in triage.  Pt complains of chest pain that she reports is a pressure to the substernal region that began while she was shopping yesterday.  Pain has been intermittent but worsening when she ambulates.  Does have a prior history of stent placement in October 2021, followed by Dr. Johney Frame.  Also endorsing some feet numbness along with tingling.  Review of Systems  Positive: Chest pain, shortness of breath Negative: Fever, vomiting, nausea  Physical Exam  BP (!) 151/80 (BP Location: Right Arm)   Pulse 73   Temp 98.4 F (36.9 C) (Oral)   Resp 16   SpO2 99%  Gen:   Awake, no distress   Resp:  Normal effort  MSK:   Moves extremities without difficulty  Other:    Medical Decision Making  Medically screening exam initiated at 3:04 PM.  Appropriate orders placed.  Patricia Garcia was informed that the remainder of the evaluation will be completed by another provider, this initial triage assessment does not replace that evaluation, and the importance of remaining in the ED until their evaluation is complete.  Patient with significant history of CAD under the care of Dr. Johney Frame here with central chest pain worsened with exertion.  No MAT.   Patricia Fitting, PA-C 03/13/21 1505    Patricia Bo, MD 03/13/21 [phone]

## 2021-03-13 NOTE — Discharge Instructions (Addendum)
Take your medicines as prescribed   Your heart enzymes are normal right now  See your cardiologist. Call the office tomorrow about moving up your appointment   Return to ER if you have worse chest pain, shortness of breath.

## 2021-03-13 NOTE — Telephone Encounter (Signed)
Pt c/o of Chest Pain: STAT if CP now or developed within 24 hours  1. Are you having CP right now? NO  2. Are you experiencing any other symptoms (ex. SOB, nausea, vomiting, sweating)? No   3. How long have you been experiencing CP? Since last night  4. Is your CP continuous or coming and going? Both  5. Have you taken Nitroglycerin? no ?

## 2021-03-13 NOTE — Telephone Encounter (Signed)
Spoke with pt who reports she has been having active CP since this morning.  Pt with history of CAD and stent placement 04/2020.  She reports she is taking medications as prescribed.  She denies SOB, dizziness, N&V or edema.  Reports CP radiates from center of her chest upward.  Current BP 155/87 and HR 76.   Pt advised with active CP she should be evaluated in ED and should not drive herself.  Pt verbalizes understanding and agrees with current plan.

## 2021-03-13 NOTE — ED Provider Notes (Signed)
[facility] EMERGENCY DEPARTMENT Provider Note   CSN: [id number] Arrival date & time: 03/13/21  1342     History No chief complaint on file.   AARVI STABLES is a 87 y.o. female hx of CAD s/p stent, CHF, here presenting with chest pressure.  Patient states that for the last several days, she has some chest pressure.  She states that yesterday she was shopping and she noticed it.  She states that today she woke up and she just feels very tired.  She had some chest pressure today and thought it was reflux. Patient also has some tingling in the bottom of her left foot but denies any back pain or pain radiate down her leg.  Patient denies any pain with exertion right now.  She has follow-up with cardiology next week.  The history is provided by the patient.      Past Medical History:  Diagnosis Date   Anal or rectal pain    Anemia    Arthritis    CAD (coronary artery disease)    a. NSTEMI 04/2020 s/p DES to [facility] and D1.   Chronic diastolic CHF (congestive heart failure) (HCC)    Depression    Diverticulosis of colon (without mention of hemorrhage)    Hypertension    Hypoalbuminemia    Internal hemorrhoids without mention of complication    Ischemic heart disease    LBBB (left bundle branch block)    Osteopenia    Renal insufficiency    S/P right hip fracture    Slow transit constipation     Patient Active Problem List   Diagnosis Date Noted   Acute systolic heart failure ([place])    Respiratory failure with hypoxia ([place]) 12/06/2020   Nausea and vomiting 12/06/2020   Prolonged QT interval 12/06/2020   Hyponatremia 12/06/2020   Nephrolithiasis 12/06/2020   History of total hip arthroplasty 10/18/2020   Trochanteric bursitis of right hip 10/18/2020   Mild major depression, single episode ([place]) 09/26/2020   Pure hypercholesterolemia 09/26/2020   Pain of right hip joint 08/07/2020   Acute non-ST segment elevation myocardial infarction ([place]) 08/02/2020    Acute on chronic diastolic heart failure ([place]) 08/02/2020   Allergic rhinitis due to pollen 08/02/2020   CAD (coronary artery disease) 08/02/2020   Chronic kidney disease, stage 3a ([place]) 08/02/2020   Dyslipidemia 08/02/2020   Hardening of the aorta (main artery of the heart) ([place]) 08/02/2020   Hearing loss 08/02/2020   Hypertensive renal disease 08/02/2020   Hypertensive heart failure ([place]) 08/02/2020   Intestinal malabsorption 08/02/2020   Ischemic cardiomyopathy 08/02/2020   Primary localized osteoarthritis of pelvic region and thigh 08/02/2020   Spondylosis without myelopathy 08/02/2020   NSTEMI (non-ST elevated myocardial infarction) ([place]) 05/07/2020   Postoperative anemia 05/07/2020   Acute exacerbation of CHF (congestive heart failure) ([place]) 05/06/2020   Acute respiratory failure with hypoxia ([place]) 05/06/2020   LBBB (left bundle branch block) 05/06/2020   Sinus congestion 05/06/2020   Femur fracture, right ([place]) 04/30/2020   Fracture of neck of femur ([place]) 04/30/2020   Chronic abdominal pain 09/23/2011   Gas pain 09/23/2011   Constipation, slow transit 09/23/2011   Anorectal pain 09/23/2011   GASTRITIS 06/26/2010   Slow transit constipation 06/21/2010   RECTAL BLEEDING 06/21/2010   Vitamin D deficiency 06/20/2010   Generalized anxiety disorder 06/20/2010   GLAUCOMA 06/20/2010   Cataract 06/20/2010   Benign essential hypertension 06/20/2010   Chronic sinusitis 06/20/2010   GERD 06/20/2010  Disorder of skeletal system 06/20/2010   Abdominal pain 06/20/2010   MICROALBUMINURIA 06/20/2010   Gastroesophageal reflux disease 06/20/2010    Past Surgical History:  Procedure Laterality Date   ABDOMINAL HYSTERECTOMY     CARDIAC CATHETERIZATION     CATARACT EXTRACTION, BILATERAL     CORONARY STENT INTERVENTION N/A 05/08/2020   Procedure: CORONARY STENT INTERVENTION;  Surgeon: Martinique, Peter M, MD;  Location: [facility];  Service: Cardiovascular;  Laterality: N/A;    LEFT HEART CATH AND CORONARY ANGIOGRAPHY N/A 05/08/2020   Procedure: LEFT HEART CATH AND CORONARY ANGIOGRAPHY;  Surgeon: Martinique, Peter M, MD;  Location: [facility];  Service: Cardiovascular;  Laterality: N/A;   MASS EXCISION  06/10/2011   Procedure: EXCISION MASS;  Surgeon: Wynonia Sours, MD;  Location: [facility];  Service: Orthopedics;  Laterality: Right;  excision cyst right thumb IP, debridement IP right thumb   RIGHT/LEFT HEART CATH AND CORONARY ANGIOGRAPHY N/A 12/10/2020   Procedure: RIGHT/LEFT HEART CATH AND CORONARY ANGIOGRAPHY;  Surgeon: Troy Sine, MD;  Location: [facility];  Service: Cardiovascular;  Laterality: N/A;   TONSILLECTOMY     TOTAL HIP ARTHROPLASTY Right 05/02/2020   Procedure: TOTAL HIP ARTHROPLASTY ANTERIOR APPROACH;  Surgeon: Rod Can, MD;  Location: [facility];  Service: Orthopedics;  Laterality: Right;     OB History   No obstetric history on file.     Family History  Problem Relation Age of Onset   Colon polyps Mother    Cancer Father        bladder   Heart disease Father     Social History   Tobacco Use   Smoking status: Former    Types: Cigarettes    Quit date: 06/05/1984    Years since quitting: 36.7   Smokeless tobacco: Never  Vaping Use   Vaping Use: Never used  Substance Use Topics   Alcohol use: No   Drug use: No    Home Medications Prior to Admission medications   Medication Sig Start Date End Date Taking? Authorizing Provider  albuterol (VENTOLIN HFA) 108 (90 Base) MCG/ACT inhaler Inhale 1-2 puffs into the lungs every 6 (six) hours as needed for wheezing or shortness of breath. 12/01/20  Yes Wieters, Hallie C, PA-C  aspirin 81 MG chewable tablet Chew 81 mg by mouth daily.   Yes [provider]  bisoprolol (ZEBETA) 5 MG tablet Take 0.5 tablets (2.5 mg total) by mouth daily. 01/17/21  Yes Loel Dubonnet, NP  Cholecalciferol (VITAMIN D-3 PO) Take 1 capsule by mouth daily with breakfast.   Yes  [provider]  clopidogrel (PLAVIX) 75 MG tablet Take 1 tablet (75 mg total) by mouth daily. 08/23/20  Yes Freada Bergeron, MD  docusate sodium (COLACE) 100 MG capsule Take 100 mg by mouth in the morning.   Yes [provider]  esomeprazole (NEXIUM) 40 MG capsule Take 1 capsule (40 mg total) by mouth daily. Patient taking differently: Take 40 mg by mouth daily before breakfast. 02/08/21  Yes Freada Bergeron, MD  ferrous sulfate 325 (65 FE) MG tablet Take 325 mg by mouth every Monday, Wednesday, and Friday.   Yes [provider]  furosemide (LASIX) 40 MG tablet Take 1 tablet (40 mg total) by mouth daily. 01/17/21  Yes Loel Dubonnet, NP  lactulose (CHRONULAC) 10 GM/15ML solution Take 10 g by mouth daily as needed for mild constipation. 02/26/21  Yes [provider]  latanoprost Ivin Poot)  0.005 % ophthalmic solution Place 1 drop into both eyes at bedtime.   Yes [provider]  Multiple Vitamins-Minerals (MULTIVITAMIN ADULTS 50+) TABS Take 1 tablet by mouth daily with breakfast.   Yes [provider]  sodium chloride (MURO 128) 2 % ophthalmic solution Place 1 drop into both eyes 2 (two) times daily.   Yes [provider]  spironolactone (ALDACTONE) 25 MG tablet Take 0.5 tablets (12.5 mg total) by mouth daily. 01/17/21  Yes Loel Dubonnet, NP  Calcium Carbonate-Vit D-Min (CALCIUM 1200 PO) Take 1,200 mg by mouth daily. Patient not taking: Reported on 03/13/2021    [provider]    Allergies    Alendronate sodium, Doxycycline, Eucalyptus oil, Linaclotide, Medrol [methylprednisolone], Penicillamine, Penicillins, Prednisone, Risedronate, Statins, Statins support [a-g pro], Statins support [acid blockers support], Sulfa antibiotics, Sulfonamide derivatives, Tramadol, Latex, and Tape  Review of Systems   Review of Systems  Cardiovascular:  Positive for chest pain.  All other systems reviewed and are negative.  Physical  Exam Updated Vital Signs BP 140/63   Pulse 65   Temp 98.4 F (36.9 C) (Oral)   Resp 12   SpO2 98%   Physical Exam Vitals and nursing note reviewed.  Constitutional:      Appearance: Normal appearance.  HENT:     Head: Normocephalic.     Nose: Nose normal.     Mouth/Throat:     Mouth: Mucous membranes are moist.  Eyes:     Extraocular Movements: Extraocular movements intact.     Pupils: Pupils are equal, round, and reactive to light.  Cardiovascular:     Rate and Rhythm: Normal rate and regular rhythm.     Pulses: Normal pulses.     Heart sounds: Normal heart sounds.  Pulmonary:     Effort: Pulmonary effort is normal.     Breath sounds: Normal breath sounds.  Abdominal:     General: Abdomen is flat.     Palpations: Abdomen is soft.  Musculoskeletal:        General: Normal range of motion.     Cervical back: Neck supple.     Comments: Slightly decreased sensation in the bottom of the left foot.  Patient has negative straight leg raise and able to ambulate.  No saddle anesthesia.  Skin:    General: Skin is warm.     Capillary Refill: Capillary refill takes less than 2 seconds.  Neurological:     General: No focal deficit present.     Mental Status: She is alert and oriented to person, place, and time.  Psychiatric:        Mood and Affect: Mood normal.        Behavior: Behavior normal.    ED Results / Procedures / Treatments   Labs (all labs ordered are listed, but only abnormal results are displayed) Labs Reviewed  COMPREHENSIVE METABOLIC PANEL - Abnormal; Notable for the following components:      Result Value   Glucose, Bld 106 (*)    AST 43 (*)    GFR, Estimated 59 (*)    All other components within normal limits  BRAIN NATRIURETIC PEPTIDE - Abnormal; Notable for the following components:   B Natriuretic Peptide 398.8 (*)    All other components within normal limits  CBC WITH DIFFERENTIAL/PLATELET  CBC WITH DIFFERENTIAL/PLATELET  TROPONIN I (HIGH  SENSITIVITY)  TROPONIN I (HIGH SENSITIVITY)    EKG EKG Interpretation  Date/Time:  Wednesday March 13 2021 14:52:31 EDT Ventricular Rate:  65 PR Interval:  250 QRS Duration: 154 QT Interval:  458 QTC Calculation: 476 R Axis:   -62 Text Interpretation: Sinus rhythm with 1st degree A-V block Left axis deviation Left bundle branch block Abnormal ECG No significant change since last tracing Confirmed by Wandra Arthurs [phone]) on 03/13/2021 3:33:46 PM  Radiology DG Chest 2 View  Result Date: 03/13/2021 CLINICAL DATA:  Chest pain beginning yesterday. Intermittent chest pain. EXAM: CHEST - 2 VIEW COMPARISON:  Two-view chest x-ray 08/30/2020 FINDINGS: Heart size is normal. Changes of COPD noted. No edema or effusion is present. No focal airspace disease is present. Axial skeleton is unremarkable. IMPRESSION: 1. No acute cardiopulmonary disease. 2. COPD. Electronically Signed   By: San Morelle M.D.   On: 03/13/2021 15:46    Procedures Procedures   Medications Ordered in ED Medications - No data to display  ED Course  I have reviewed the triage vital signs and the nursing notes.  Pertinent labs & imaging results that were available during my care of the patient were reviewed by me and considered in my medical decision making (see chart for details).    MDM Rules/Calculators/A&P                          DENIECE HILDER is a 87 y.o. female here presenting with chest pain.  Intermittent chest pressure for the last several days.  Patient does have history of cardiac stents.  However this appears to be atypical for ACS.  Given her cardiac history, will get troponin x2.  I doubt dissection or PE  7:46 PM Patient's troponin is negative x2.  She has follow-up with cardiology next week.  I wonder if patient has anginal symptoms.  Told her to continue her medications and follow-up with cardiology as scheduled next week.  If she can see cardiology this week they will be even  better   Final Clinical Impression(s) / ED Diagnoses Final diagnoses:  None    Rx / DC Orders ED Discharge Orders     None        Drenda Freeze, MD 03/13/21 [phone]

## 2021-03-15 ENCOUNTER — Other Ambulatory Visit (HOSPITAL_COMMUNITY): Payer: Self-pay

## 2021-03-15 DIAGNOSIS — R079 Chest pain, unspecified: Secondary | ICD-10-CM | POA: Diagnosis not present

## 2021-03-15 DIAGNOSIS — M79673 Pain in unspecified foot: Secondary | ICD-10-CM | POA: Diagnosis not present

## 2021-03-15 DIAGNOSIS — F419 Anxiety disorder, unspecified: Secondary | ICD-10-CM | POA: Diagnosis not present

## 2021-03-19 NOTE — Progress Notes (Signed)
Cardiology Office Note:    Date:  03/20/2021   ID:  Patricia Garcia, DOB 02-02-1933, MRN [id number]  PCP:  Darrol Jump, NP   [facility] Providers Cardiologist:  Freada Bergeron, MD Cardiology APP:  Sh[facility]on [place]      Referring MD: Lajean Manes, MD   Chief Complaint:  F/u on CHF, CAD and recent ED visit with chest pain    Patient Profile:    Patricia Garcia is a 87 y.o. female with:  Coronary artery disease  NSTEMI 10/21 s/p DES to [facility] and DES to D1 Cath 5/22: patent RCA and D1 stents; high grade disease in a very small AV groove CFx >> med Rx  (HFrEF) heart failure with reduced ejection fraction  ? BP limits GDMT  Echocardiogram 10/21: EF 50-55 Admx w acute HF 5/22 >> Echo: EF 25-30, mild to mod MR (patent stents on LHC) Hypertension  Hyperlipidemia  LBBB Depression  Anemia  R hip fx s/p THR 04/2020   Prior CV studies:  RIGHT/LEFT HEART CATH AND CORONARY ANGIOGRAPHY 12/10/2020 Narrative  Prox Cx to Mid Cx lesion is 85% stenosed.  Ramus lesion is 30% stenosed.  Previously placed 1st Diag stent (unknown type) is widely patent.  Mid LAD to Dist LAD lesion is 60% stenosed.  Mid LAD lesion is 40% stenosed.  Dist RCA lesion is 60% stenosed.  Mid RCA lesion is 30% stenosed.  Previously placed Prox RCA stent (unknown type) is widely patent.  Widely patent stents in the high diagonal and proximal RCA inserted in October 2021.  The LAD has tortuosity in its mid segment with 40 and 60% narrowing.  This is not significantly changed from the prior angiogram.  Mild disease in the ramus immediate vessel.  Previously noted very small AV groove circumflex with diffuse narrowing of 80%.  Dominant RCA with patent proximal stent and mild 30% mid stenosis with 50 to 60% narrowing before the PDA takeoff.  Mild right heart pressure elevation pulmonary hypertension.  RECOMMENDATION: Guideline directed medical therapy for heart failure with reduced  EF on recent echo at 25 to 30%.  Medical therapy for concomitant CAD.    ECHOCARDIOGRAM 12/07/20 EF 25-30, inferior, inferolateral and septal HK, mild asymmetric LVH, mildly reduced RVSF, mild-moderate MR, moderate MAC, trivial AI, mild AV sclerosis without stenosis  GATED SPECT MYO PERF W/LEXISCAN STRESS 1D 08/29/2019 No ischemia or infarct ECG with LBBB EF estimated at 46% abnormal septal motion and apical hypokinesis Suggest echo correlation     History of Present Illness: Patricia Garcia was last seen in clinic by Laurann Montana, NP in 6/22.  She was seen in the ED 03/13/21 with chest pain.  Her hs-Trops were neg (13>>13).  CXR was unremarkable.  EKG showed old LBBB.  Labs: Hgb 13.2, BNP 398, SCr 0.95, K+4.1, ALP 122, ALT 19.  She was DC home and asked to f/u with Cardiology.  She returns for f/u.  She is here alone.  She notes occasional symptoms of left-sided chest discomfort.  This typically occurs at night.  She does not have any exertional symptoms.  She has not had significant shortness of breath.  She has not had orthopnea, leg edema, syncope.  She does have radiation of her chest discomfort to her shoulder and down her left arm.  She also has a lot of gastrointestinal symptoms.  She often has bloating and gas with just drinking water.  She often has to drink a carbonated beverage to induce belching for  relief.        Past Medical History:  Diagnosis Date   Anal or rectal pain    Anemia    Arthritis    CAD (coronary artery disease)    a. NSTEMI 04/2020 s/p DES to [facility] and D1.   Chronic diastolic CHF (congestive heart failure) (HCC)    Depression    Diverticulosis of colon (without mention of hemorrhage)    Hypertension    Hypoalbuminemia    Internal hemorrhoids without mention of complication    Ischemic heart disease    LBBB (left bundle branch block)    Osteopenia    Renal insufficiency    S/P right hip fracture    Slow transit constipation     Current  Medications: Current Meds  Medication Sig   albuterol (VENTOLIN HFA) 108 (90 Base) MCG/ACT inhaler Inhale 1-2 puffs into the lungs every 6 (six) hours as needed for wheezing or shortness of breath.   aspirin (ASPIRIN EC) 81 MG EC tablet Take 81 mg by mouth daily. Swallow whole.   bisoprolol (ZEBETA) 5 MG tablet Take 0.5 tablets (2.5 mg total) by mouth daily.   Calcium Carbonate-Vit D-Min (CALCIUM 1200 PO) Take 1,200 mg by mouth daily.   Cholecalciferol (VITAMIN D-3 PO) Take 1 capsule by mouth daily after supper.   clopidogrel (PLAVIX) 75 MG tablet Take 1 tablet (75 mg total) by mouth daily.   docusate sodium (COLACE) 100 MG capsule Take 100 mg by mouth in the morning.   ferrous sulfate 325 (65 FE) MG tablet Take 325 mg by mouth every Monday, Wednesday, and Friday.   furosemide (LASIX) 40 MG tablet Take 1 tablet (40 mg total) by mouth daily.   isosorbide mononitrate (IMDUR) 30 MG 24 hr tablet Take 0.5 tablets (15 mg total) by mouth daily.   lactulose (CHRONULAC) 10 GM/15ML solution Take 10 g by mouth daily as needed for mild constipation.   latanoprost (XALATAN) 0.005 % ophthalmic solution Place 1 drop into both eyes at bedtime.   Multiple Vitamins-Minerals (MULTIVITAMIN ADULTS 50+) TABS Take 1 tablet by mouth daily with breakfast.   nitroGLYCERIN (NITROSTAT) 0.4 MG SL tablet Place 1 tablet (0.4 mg total) under the tongue every 5 (five) minutes as needed for chest pain.   pantoprazole (PROTONIX) 40 MG tablet Take 1 tablet (40 mg total) by mouth daily.   sodium chloride (MURO 128) 2 % ophthalmic solution Place 1 drop into both eyes 2 (two) times daily.   spironolactone (ALDACTONE) 25 MG tablet Take 0.5 tablets (12.5 mg total) by mouth daily.   [DISCONTINUED] esomeprazole (NEXIUM) 40 MG capsule Take 1 capsule (40 mg total) by mouth daily.     Allergies:   Alendronate sodium, Doxycycline, Eucalyptus oil, Linaclotide, Medrol [methylprednisolone], Penicillamine, Penicillins, Prednisone, Risedronate,  Statins, Statins support [a-g pro], Statins support [acid blockers support], Sulfa antibiotics, Sulfonamide derivatives, Tramadol, Latex, and Tape   Social History   Tobacco Use   Smoking status: Former    Types: Cigarettes    Quit date: 06/05/1984    Years since quitting: 36.8   Smokeless tobacco: Never  Vaping Use   Vaping Use: Never used  Substance Use Topics   Alcohol use: No   Drug use: No     Family Hx: The patient's family history includes Cancer in her father; Colon polyps in her mother; Heart disease in her father.  Review of Systems  Constitutional: Negative for fever.  Respiratory:  Negative for cough.   Endocrine:       +  Hot Flashes  Gastrointestinal:  Positive for constipation. Negative for hematochezia.  Genitourinary:  Negative for hematuria.  Psychiatric/Behavioral:  The patient has insomnia.     EKGs/Labs/Other Test Reviewed:    EKG:  EKG is not ordered today.  The ekg ordered today demonstrates N/A  Recent Labs: 12/07/2020: Magnesium 1.8 03/13/2021: ALT 19; B Natriuretic Peptide 398.8; BUN 21; Creatinine, Ser 0.95; Hemoglobin 13.2; Platelets 352; Potassium 4.1; Sodium 136   Recent Lipid Panel Lab Results  Component Value Date/Time   CHOL 180 12/10/2020 05:11 AM   CHOL 257 (H) 07/16/2020 09:04 AM   TRIG 147 12/10/2020 05:11 AM   HDL 40 (L) 12/10/2020 05:11 AM   HDL 49 07/16/2020 09:04 AM   LDLCALC 111 (H) 12/10/2020 05:11 AM   LDLCALC 163 (H) 07/16/2020 09:04 AM      Risk Assessment/Calculations:          Physical Exam:    VS:  BP 118/64   Pulse 78   Ht 5' 2.5" (1.588 m)   Wt 112 lb 6.4 oz (51 kg)   SpO2 98%   BMI 20.23 kg/m     Wt Readings from Last 3 Encounters:  03/20/21 112 lb 6.4 oz (51 kg)  01/31/21 110 lb (49.9 kg)  01/24/21 111 lb (50.3 kg)     Constitutional:      Appearance: Healthy appearance. Not in distress.  Neck:     Vascular: No JVR. JVD normal.  Pulmonary:     Effort: Pulmonary effort is normal.     Breath  sounds: No wheezing. No rales.  Cardiovascular:     Normal rate. Regular rhythm. Normal S1. Normal S2.      Murmurs: There is no murmur.  Edema:    Peripheral edema absent.  Abdominal:     Palpations: Abdomen is soft. There is no hepatomegaly.  Skin:    General: Skin is warm and dry.  Neurological:     Mental Status: Alert and oriented to person, place and time.     Cranial Nerves: Cranial nerves are intact.        ASSESSMENT & PLAN:     1. Coronary artery disease involving native coronary artery of native heart with angina pectoris ([place]) Non-STEMI in 10/21 treated with a DES to the mid RCA and DES to the D1.  Cardiac catheterization in 11/2020 demonstrated widely patent stents in the diagonal and RCA.  There was moderate disease in the LAD and mid to distal RCA.  She had a small AV groove circumflex with diffuse 80% stenosis.  Medical therapy was recommended.  She recently went to the emergency room with chest discomfort.  Some of her symptoms sound consistent with angina.  There also seem to be some GI overtones.  We discussed the results of her recent cardiac catheterization.  I suspect she may be having some anginal symptoms related to the small AV groove circumflex with diffuse, severe stenosis.  She has had difficulty tolerating GDMT secondary to low blood pressure.  She is also had side effects to prior medications prompting their discontinuation (statins, ezetimibe, ARB, beta-blocker).  I have recommended initiating isosorbide mononitrate 15 mg daily.  Continue aspirin 81 mg daily, clopidogrel 75 mg daily.  I will also give her a prescription for as needed nitroglycerin.  Follow-up in 8-10 weeks.  2. HFrEF (heart failure with reduced ejection fraction) ([place]) EF 25-30 by echocardiogram 5/22.  NYHA II-IIb.  Volume status currently stable.  As noted, titration of GDMT has been  limited due to low blood pressure and side effects.  Continue bisoprolol 2.5 mg daily, spironolactone 12.5 mg daily,  furosemide 40 mg daily.  She previously could not tolerate ARB.  I will place her on isosorbide 15 mg daily as noted above.  Obtain follow-up limited echocardiogram in 6 weeks to reassess LV function.  If EF remains down, we could reconsider adding SGLT2 inhibitor.  3. Essential hypertension Blood pressure is well controlled.  Continue spironolactone 12.5 mg daily, bisoprolol 2.5 mg daily.  4. Hyperlipidemia LDL goal <70 She is not on therapy.  She has intolerance to statins.  She had side effects to ezetimibe.  At next visit, we can revisit further management of cholesterol and discussed referral to the lipid clinic for consideration of PCSK9 inhibitor therapy.  5. Sleeping difficulty She has a lot of issues with insomnia.  She also reports hot flashes as well as some paresthesias in her hands.  Have asked her to follow-up with primary care for this.  A lot of her symptoms sound suspicious for underlying anxiety contributing.  I did tell her that she could try taking melatonin over-the-counter to see if this helps her sleep.  Otherwise, follow-up with primary care.  6. Gastroesophageal reflux disease without esophagitis As she is on clopidogrel and had PCI in the setting of ACS, she should not be on esomeprazole.  We discussed this.  I will place her on pantoprazole 40 mg twice daily for 2 weeks, then once daily.  If she continues to have issues with bloating and gas, she will need follow-up with her PCP to determine if she should go back to gastroenterology.  I have also advised her that she could try taking famotidine 20 mg twice daily (OTC) instead of pantoprazole.     Dispo:  Return in about 8 weeks (around 05/15/2021) for Routine Follow Up, w/ Dr. Johney Frame, or Richardson Dopp, PA-C.   Medication Adjustments/Labs and Tests Ordered: Current medicines are reviewed at length with the patient today.  Concerns regarding medicines are outlined above.  Tests Ordered: Orders Placed This Encounter   Procedures   ECHOCARDIOGRAM COMPLETE   Medication Changes: Meds ordered this encounter  Medications   pantoprazole (PROTONIX) 40 MG tablet    Sig: Take 1 tablet (40 mg total) by mouth daily.    Dispense:  37 tablet    Refill:  3   isosorbide mononitrate (IMDUR) 30 MG 24 hr tablet    Sig: Take 0.5 tablets (15 mg total) by mouth daily.    Dispense:  15 tablet    Refill:  3   nitroGLYCERIN (NITROSTAT) 0.4 MG SL tablet    Sig: Place 1 tablet (0.4 mg total) under the tongue every 5 (five) minutes as needed for chest pain.    Dispense:  25 tablet    Refill:  3    Signed, Richardson Dopp, PA-C  03/20/2021 1:09 PM    [facility] [address], [place], [place]  [address] Phone: [phone]; Fax: [phone]

## 2021-03-20 ENCOUNTER — Ambulatory Visit (INDEPENDENT_AMBULATORY_CARE_PROVIDER_SITE_OTHER): Payer: Medicare Other | Admitting: Physician Assistant

## 2021-03-20 ENCOUNTER — Other Ambulatory Visit: Payer: Self-pay

## 2021-03-20 ENCOUNTER — Encounter: Payer: Self-pay | Admitting: Physician Assistant

## 2021-03-20 VITALS — BP 118/64 | HR 78 | Ht 62.5 in | Wt 112.4 lb

## 2021-03-20 DIAGNOSIS — G479 Sleep disorder, unspecified: Secondary | ICD-10-CM | POA: Diagnosis not present

## 2021-03-20 DIAGNOSIS — I5020 Unspecified systolic (congestive) heart failure: Secondary | ICD-10-CM

## 2021-03-20 DIAGNOSIS — I25119 Atherosclerotic heart disease of native coronary artery with unspecified angina pectoris: Secondary | ICD-10-CM | POA: Diagnosis not present

## 2021-03-20 DIAGNOSIS — I2510 Atherosclerotic heart disease of native coronary artery without angina pectoris: Secondary | ICD-10-CM

## 2021-03-20 DIAGNOSIS — I10 Essential (primary) hypertension: Secondary | ICD-10-CM

## 2021-03-20 DIAGNOSIS — K219 Gastro-esophageal reflux disease without esophagitis: Secondary | ICD-10-CM | POA: Diagnosis not present

## 2021-03-20 DIAGNOSIS — E785 Hyperlipidemia, unspecified: Secondary | ICD-10-CM

## 2021-03-20 MED ORDER — ISOSORBIDE MONONITRATE ER 30 MG PO TB24: 15.0000 mg | ORAL_TABLET | Freq: Every day | ORAL | 3 refills | Status: DC

## 2021-03-20 MED ORDER — PANTOPRAZOLE SODIUM 40 MG PO TBEC: 40.0000 mg | DELAYED_RELEASE_TABLET | Freq: Every day | ORAL | 3 refills | Status: DC

## 2021-03-20 MED ORDER — NITROGLYCERIN 0.4 MG SL SUBL: 0.4000 mg | SUBLINGUAL_TABLET | SUBLINGUAL | 3 refills | Status: DC | PRN

## 2021-03-20 NOTE — Patient Instructions (Addendum)
Medication Instructions:   DISCONTINUE NEXIUM  START PROTONIX one tablet by mouth ( 40 mg ) twice daily X 2 weeks than take everyday.  OK to take Pepcid one tablet by mouth ( 20 mg) twice daily, This is over the counter instead of Protonix.  START IMDUR one half tablet by mouth ( 15 mg) daily.   START NITRO AS NEEDED.  If a single episode of chest pain is not relieved by one tablet, the patient will try another within 5 minutes; and if this doesn't relieve the pain, the patient will try another within 5 minutes and if this doesn't relieve the pain the patient is instructed to call 911 for transportation to an emergency department.  You can take melatonin Over The Counter for sleep.   *If you need a refill on your cardiac medications before your next appointment, please call your pharmacy*   Lab Work: - NONE  If you have labs (blood work) drawn today and your tests are completely normal, you will receive your results only by: [place] (if you have MyChart) OR A paper copy in the mail If you have any lab test that is abnormal or we need to change your treatment, we will call you to review the results.   Testing/Procedures: Your physician has requested that you have an echocardiogram. Echocardiography is a painless test that uses sound waves to create images of your heart. It provides your doctor with information about the size and shape of your heart and how well your heart's chambers and valves are working. This procedure takes approximately one hour. There are no restrictions for this procedure.    Follow-Up: At [facility], you and your health needs are our priority.  As part of our continuing mission to provide you with exceptional heart care, we have created designated Provider Care Teams.  These Care Teams include your primary Cardiologist (physician) and Advanced Practice Providers (APPs -  Physician Assistants and Nurse Practitioners) who all work together to provide  you with the care you need, when you need it.  We recommend signing up for the patient portal called "MyChart".  Sign up information is provided on this After Visit Summary.  MyChart is used to connect with patients for Virtual Visits (Telemedicine).  Patients are able to view lab/test results, encounter notes, upcoming appointments, etc.  Non-urgent messages can be sent to your provider as well.   To learn more about what you can do with MyChart, go to [email or web address].    Your next appointment:   10 week(s)  The format for your next appointment:   In Person  Provider:   Richardson Dopp, PA-C   Other Instructions  You can discuss with your PCP your:  Hand tingly Hot flashes Sleep issues

## 2021-03-22 ENCOUNTER — Telehealth: Payer: Self-pay | Admitting: Cardiology

## 2021-03-22 NOTE — Telephone Encounter (Signed)
Spoke with pt and is going to try and take Isosorbide and see if tolerates previous Per pt sensation only lasted about an hour and feels better now . B/P was 158/72 and pt has  checked several since then and the last one was 109/65 with HR 70 Pt will call back with update if does not continue med Per pt has switched meds so many times pt is afraid pain is coming from  GI not Cardiac from all of the meds is taking Will let Richardson Dopp PAC know .Adonis Housekeeper

## 2021-03-22 NOTE — Telephone Encounter (Signed)
Pt c/o medication issue:  1. Name of Medication: isosorbide mononitrate (IMDUR) 30 MG 24 hr tablet  2. How are you currently taking this medication (dosage and times per day)? Half a tablet   3. Are you having a reaction (difficulty breathing--STAT)? no  4. What is your medication issue? Patient states she just started the medication this morning less than an hour ago and states she started feeling "strange all over" especially in her head. She states it is kind of like lightheadedness, but is hard to explain. She says she "just feel strange"  and would not trust herself to drive. She says she does not feel like she will pass out and is not having difficulty breathing. She says she lives alone and has no one to call. She states she is not having any pain. She says she does feel sleepy and her legs feel weak. While on the phone she said she was getting a little nauseated, but nothing big. She says her symptoms are difficult to explain but her "whole body feels uncomfortable". She says she also feels a little something in her throat, but had no difficulty breathing and was not SOB. She states she can see fine, but her eyes feel strange as well. She says she is not sure if it is acid reflux, because she has been having that. She says it feels like something is wrong and this is the only new medication she has started.

## 2021-03-22 NOTE — Telephone Encounter (Signed)
Spoke with pt and felt like maybe had reaction to med Per pt is feeling better now and it has been about an hour since taking Isosorbide B/P was 1587/82 and HR was 72 Per pt feels headache coming on now Pt undecided if will try a few more days or just not take anymore Suggested to pt to try and take in the evening and see if this makes a difference Pt had weird sensation feeling  Will forward to Richardson Dopp [facility] for review and recommendations ./cy

## 2021-03-22 NOTE — Telephone Encounter (Addendum)
Listed BP in note typo/error.  I recommend she stop the Isosorbide. She can try to increase her Bisoprolol from 1/2 tab to 1 whole tab (5 mg) to see if this helps prevent chest pain. I also gave her prn NTG she can use if she has recurrent chest pain. Richardson Dopp, PA-C    03/22/2021 12:27 PM

## 2021-03-26 NOTE — Telephone Encounter (Signed)
[address] [place], [place]    03/26/2021 5:02 PM

## 2021-03-29 ENCOUNTER — Encounter: Payer: Self-pay | Admitting: *Deleted

## 2021-03-29 ENCOUNTER — Other Ambulatory Visit: Payer: Self-pay | Admitting: *Deleted

## 2021-03-29 NOTE — Patient Outreach (Signed)
[facility]) Care Management  03/29/2021  TASHONNA SILLIMAN [birth year] [id number]   [place] outreach to complex care patient  Referral Date: 12/12/20 Referral Source: [facility] liaison Referral Reason: complex care and disease management follow up calls and assess for further needs.SDOH - patient requested [facility] meals Insurance: [employer] Medicare and blue cross and blue shield supplement    Outreach to patient   Patient is able to verify HIPAA identifiers   Assessment She is doing well no longer having chest pains She is working with her cardiology office on medication changes Stopped Isosorbide and increased bisoprolol to 5 mg with nitroglycerin prn  She is working on water maintenance at home with her neighbor Wished her a pre happy birthday  Plan Patient agrees to care plan and follow up witihin the next 90 business days   Leidy Massar L. Lavina Hamman, RN, BSN, [place] Coordinator Office number [phone] Mobile number [phone]  Main [facility] number [phone] Fax number [phone]

## 2021-04-01 ENCOUNTER — Telehealth: Payer: Self-pay | Admitting: Physician Assistant

## 2021-04-01 NOTE — Telephone Encounter (Signed)
Pt c/o medication issue:  1. Name of Medication: pantoprazole (PROTONIX) 40 MG tablet isosorbide mononitrate (IMDUR) 30 MG 24 hr tablet   2. How are you currently taking this medication (dosage and times per day)? Take 1 tablet (40 mg total) by mouth daily.  3. Are you having a reaction (difficulty breathing--STAT)? NO  4. What is your medication issue? Pt started taking this medicine on 03/20/21 her stomach is bloated and jaw pain. Pt drank a coke and the bloated stomach went down. Pt does not think the pain in her stomach has nothing to do with her heart because she thinks her heart is in good condition. Pt was advised to take 1 pill in the day and 1 at night but she no longer wants to take the night dosage.   Pt is also taking IMDUR and it makes her feel weird, it's difficult for her to function and she cant describe the feeling to me.

## 2021-04-01 NOTE — Telephone Encounter (Signed)
Spoke with the patient who reports that she decided to stop taking Imdur. She does not want to increase bisoprolol and previously recommended if she was going to stop Imdur. She reports that she also is going to decrease protonix to 40 mg once daily. She was supposed to continue taking it twice daily for two weeks but she does not like taking it at night because she has trouble sleeping. She will go ahead and decrease to once daily in the mornings. She continues to have bloating and stomach pain. She is seeing her PCP in regards to these concerns next week.

## 2021-04-09 DIAGNOSIS — Z0000 Encounter for general adult medical examination without abnormal findings: Secondary | ICD-10-CM | POA: Diagnosis not present

## 2021-04-09 DIAGNOSIS — F32A Depression, unspecified: Secondary | ICD-10-CM | POA: Diagnosis not present

## 2021-04-16 DIAGNOSIS — Z23 Encounter for immunization: Secondary | ICD-10-CM | POA: Diagnosis not present

## 2021-05-01 ENCOUNTER — Encounter: Payer: Self-pay | Admitting: Physician Assistant

## 2021-05-01 ENCOUNTER — Ambulatory Visit (HOSPITAL_COMMUNITY): Payer: Medicare Other | Attending: Cardiology

## 2021-05-01 ENCOUNTER — Other Ambulatory Visit: Payer: Self-pay

## 2021-05-01 DIAGNOSIS — I10 Essential (primary) hypertension: Secondary | ICD-10-CM | POA: Insufficient documentation

## 2021-05-01 DIAGNOSIS — I5020 Unspecified systolic (congestive) heart failure: Secondary | ICD-10-CM | POA: Diagnosis not present

## 2021-05-01 DIAGNOSIS — I25119 Atherosclerotic heart disease of native coronary artery with unspecified angina pectoris: Secondary | ICD-10-CM | POA: Diagnosis not present

## 2021-05-01 DIAGNOSIS — E785 Hyperlipidemia, unspecified: Secondary | ICD-10-CM | POA: Insufficient documentation

## 2021-05-01 LAB — ECHOCARDIOGRAM COMPLETE
Area-P 1/2: 3.03 cm2
S' Lateral: 3.8 cm

## 2021-05-08 DIAGNOSIS — N1831 Chronic kidney disease, stage 3a: Secondary | ICD-10-CM | POA: Diagnosis not present

## 2021-05-08 DIAGNOSIS — I5023 Acute on chronic systolic (congestive) heart failure: Secondary | ICD-10-CM | POA: Diagnosis not present

## 2021-05-08 DIAGNOSIS — F419 Anxiety disorder, unspecified: Secondary | ICD-10-CM | POA: Diagnosis not present

## 2021-05-08 DIAGNOSIS — I130 Hypertensive heart and chronic kidney disease with heart failure and stage 1 through stage 4 chronic kidney disease, or unspecified chronic kidney disease: Secondary | ICD-10-CM | POA: Diagnosis not present

## 2021-05-08 DIAGNOSIS — R54 Age-related physical debility: Secondary | ICD-10-CM | POA: Diagnosis not present

## 2021-05-08 DIAGNOSIS — H409 Unspecified glaucoma: Secondary | ICD-10-CM | POA: Diagnosis not present

## 2021-05-08 DIAGNOSIS — I2510 Atherosclerotic heart disease of native coronary artery without angina pectoris: Secondary | ICD-10-CM | POA: Diagnosis not present

## 2021-05-09 ENCOUNTER — Telehealth: Payer: Self-pay | Admitting: Physician Assistant

## 2021-05-09 NOTE — Telephone Encounter (Signed)
Pt c/o medication issue:  1. Name of Medication: clopidogrel (PLAVIX) 75 MG tablet aspirin (ASPIRIN EC) 81 MG EC tablet  2. How are you currently taking this medication (dosage and times per day)?   3. Are you having a reaction (difficulty breathing--STAT)? NO  4. What is your medication issue? PT HAD A DENTAL PROCEDURE  2 HOURS AGO THIS MORNING , SHE WANTS TO KNOW IF SHE CAN START BACK TAKING THE PLAVIX AND ASPIRIN. PT HAVE NOT HAD ANY BLEEDING

## 2021-05-09 NOTE — Telephone Encounter (Signed)
Pt calling in, states she had a dental procedure today at Dr. Marya Landry office. She reports that he told her to hold her Plavix today, but to call our office to see when it is ok to restart. Pt informed that because we did not tell her to hold it then we cannot advise on when safe/ok to restart it. Explained that we would  have required pre op clearance in order to hold that medication, clearance to include procedure and what medication they are using and what medication they would like pt to hold. Pt not happy that I cannot "just give a simple answer on this".  Tried several times to explain the rationale as to why and that we did NOT tell her to hold this medication. Pt advised to call dentist office tomorrow to discuss. Aware I will also call their office tomorrow to discuss this situation and ensure it does not occur again going forward. Pt still not happy with me for not giving her an answer, but she did states she understands, but still doesn't make her happy.

## 2021-05-10 NOTE — Telephone Encounter (Signed)
Thank you! Richardson Dopp, PA-C    05/10/2021 1:40 PM

## 2021-05-10 NOTE — Telephone Encounter (Signed)
Spoke to pts dentist office and informed them that pt should be calling them this morning. Explained that pt called into our office yesterday afternoon asking if she can resume her Plavix.  Further explained to them that we advised pt to call their office (dentist) to discuss restarting Plavix.  Office number given in case dentist would like to call to discuss.

## 2021-05-10 NOTE — Telephone Encounter (Signed)
Reviewed with Richardson Dopp, PA. Called dentist office back to inquire as to the reasoning to why they told pt to hold this medication, per Richardson Dopp. Informed their office that we would call pt and advice her to restart it, per Richardson Dopp. Pt made aware to restart Plavix. Patient verbalized understanding and agreeable to plan.

## 2021-05-21 NOTE — Progress Notes (Signed)
Cardiology Office Note:    Date:  05/22/2021   ID:  Patricia Garcia, DOB Apr 10, 1933, MRN [id number]  PCP:  Patricia Jump, NP   [facility] Providers Cardiologist:  Patricia Bergeron, MD Cardiology APP:  Patricia Garcia     Referring MD: Patricia Jump, NP   Chief Complaint:  F/u for CAD, CHF    Patient Profile:   Patricia Garcia is a 87 y.o. female with:  Coronary artery disease  NSTEMI 10/21 s/p DES to [facility] and DES to D1 Cath 5/22: patent RCA and D1 stents; high grade disease in a very small AV groove CFx >> med Rx  HFmrEF (heart failure with mildly reduced ejection fraction)  ? BP limits GDMT  Echocardiogram 10/21: EF 50-55 Admx w acute HF 5/22 >> Echo: EF 25-30, mild to mod MR (patent stents on LHC) Echocardiogram 10/22: EF 40-45 Hypertension  Hyperlipidemia  LBBB Depression  Anemia  R hip fx s/p THR 04/2020  History of Present Illness: Patricia Garcia was last seen in 8/22.  She was having symptoms of angina.  I added Isosorbide to her medical regimen.  She could not tolerate this due to elevated BPs.  I suggested increasing her Bisoprolol but she preferred not to do this.  A f/u echocardiogram demonstrated improved LVF with EF 40-45.  She returns for f/u.  She is here alone.  She is doing well overall.  She has not had chest pain.  She has been short of breath with certain activities. But, she is very active and does not seem to have any limitations.  She has not had syncope, near syncope, orthopnea, leg edema.        ASSESSMENT & PLAN:   HFmrEF (heart failure with mildly reduced ejection fraction) (Patricia Garcia) EF in May was 25-30.  This has improved to 40-45 on most recent echocardiogram.  She is NYHA II.  Volume status appears stable.  Low blood pressure and medication intolerances have limited GDMT.  She could not tolerate isosorbide.  Overall, she is currently doing well.  At this point, I do not think we should attempt starting her on SGLT2 inhibitor.  Continue  bisoprolol 2.5 mg daily, spironolactone 12.5 mg daily, furosemide 40 mg daily.  Follow-up in 6 months.  Coronary artery disease involving native coronary artery of native heart with angina pectoris (Patricia Garcia) History of non-STEMI in 10/21 treated with a DES to the mid RCA and DES to the D1.  Cardiac catheterization in May 2022 demonstrated patent stents in the diagonal and RCA.  She does have moderate disease in LAD, mid to distal RCA and high-grade stenosis in a small AV groove circumflex.  She has been managed medically.  She is currently doing well without anginal symptoms.  Continue aspirin 81 mg daily, clopidogrel 75 mg daily, bisoprolol 2.5 mg daily.  She has been intolerant to statins.  Essential hypertension Repeat blood pressure by me was 130/70 on the right and 124/68 on the left.  Blood pressure is well controlled.  Continue current dose of bisoprolol, spironolactone.  Hyperlipidemia LDL goal <70 She has been intolerant of statins and ezetimibe.  We discussed the importance of getting LDL below 70 to reduce risk of future events.  She feels that she has changed her diet and would like to check her cholesterol again before proceeding with alternative therapies.  Arrange fasting lipids.  If her LDL is still above 70, refer to the lipid clinic for consideration of PCSK9 inhibitor therapy.  Mitral regurgitation Mild to mod on echocardiogram in 5/22.  This improved on most recent echocardiogram to mild MR.  Consider repeat echocardiogram in 1 year.   GERD She notes no further chest pain since changing from Prilosec to Protonix.           Dispo:  Return in about 6 months (around 11/19/2021) for Routine follow up in 6 months with Dr. Johney Garcia or Patricia Garcia [facility]..    Prior CV studies: Echocardiogram 05/01/21 EF 40-45, mild LVH, GR 1 DD, inferior and inferoseptal HK, normal RVSF, mild LAE, mild MR   RIGHT/LEFT HEART CATH AND CORONARY ANGIOGRAPHY 12/10/2020 LAD mid 40, mid-distal 60; D1 stent  patent RI 30 LCx proximal-mid 85 RCA proximal stent patent, mid 30, distal 60   ECHOCARDIOGRAM 12/07/20 EF 25-30, inferior, inferolateral and septal HK, mild asymmetric LVH, mildly reduced RVSF, mild-moderate MR, moderate MAC, trivial AI, mild AV sclerosis without stenosis   GATED SPECT MYO PERF W/LEXISCAN STRESS 1D 08/29/2019 No ischemia or infarct, EF 46     Past Medical History:  Diagnosis Date   Anal or rectal pain    Anemia    Arthritis    CAD (coronary artery disease)    a. NSTEMI 04/2020 s/p DES to [facility] and D1.   Depression    Diverticulosis of colon (without mention of hemorrhage)    HFrEF (heart failure with reduced ejection fraction) ([place])    Echo 5/22: EF 25-30 // Echocardiogram 10/22: EF 40-45, mild LVH, Gr 1 DD, inf and inf-sept HK, normal RVSF, mild LAE, mild MR   Hypertension    Hypoalbuminemia    Internal hemorrhoids without mention of complication    Ischemic heart disease    LBBB (left bundle branch block)    Osteopenia    Renal insufficiency    S/P right hip fracture    Slow transit constipation    Current Medications: Current Meds  Medication Sig   albuterol (VENTOLIN HFA) 108 (90 Base) MCG/ACT inhaler Inhale 1-2 puffs into the lungs every 6 (six) hours as needed for wheezing or shortness of breath.   aspirin 81 MG EC tablet Take 81 mg by mouth daily. Swallow whole.   bisoprolol (ZEBETA) 5 MG tablet Take 0.5 tablets (2.5 mg total) by mouth daily.   Calcium Carbonate-Vit D-Min (CALCIUM 1200 PO) Take 1,200 mg by mouth daily.   Cholecalciferol (VITAMIN D-3 PO) Take 1 capsule by mouth daily after supper.   clopidogrel (PLAVIX) 75 MG tablet Take 1 tablet (75 mg total) by mouth daily.   docusate sodium (COLACE) 100 MG capsule Take 100 mg by mouth in the morning.   ferrous sulfate 325 (65 FE) MG tablet Take 325 mg by mouth every Monday, Wednesday, and Friday.   furosemide (LASIX) 40 MG tablet Take 1 tablet (40 mg total) by mouth daily.   latanoprost (XALATAN)  0.005 % ophthalmic solution Place 1 drop into both eyes at bedtime.   Multiple Vitamins-Minerals (MULTIVITAMIN ADULTS 50+) TABS Take 1 tablet by mouth daily with breakfast.   nitroGLYCERIN (NITROSTAT) 0.4 MG SL tablet Place 1 tablet (0.4 mg total) under the tongue every 5 (five) minutes as needed for chest pain.   pantoprazole (PROTONIX) 40 MG tablet Take 1 tablet (40 mg total) by mouth daily.   sodium chloride (MURO 128) 2 % ophthalmic solution Place 1 drop into both eyes 2 (two) times daily.   spironolactone (ALDACTONE) 25 MG tablet Take 0.5 tablets (12.5 mg total) by mouth daily.    Allergies:  Imdur [isosorbide nitrate], Alendronate sodium, Doxycycline, Eucalyptus oil, Linaclotide, Medrol [methylprednisolone], Penicillamine, Penicillins, Prednisone, Risedronate, Statins, Statins support [a-g pro], Statins support [acid blockers support], Sulfa antibiotics, Sulfonamide derivatives, Tramadol, Latex, and Tape   Social History   Tobacco Use   Smoking status: Former    Types: Cigarettes    Quit date: 06/05/1984    Years since quitting: 36.9   Smokeless tobacco: Never  Vaping Use   Vaping Use: Never used  Substance Use Topics   Alcohol use: No   Drug use: No    Family Hx: The patient's family history includes Cancer in her father; Colon polyps in her mother; Heart disease in her father.  ROS See HPI  EKGs/Labs/Other Test Reviewed:    EKG:  EKG is not ordered today.  The ekg ordered today demonstrates n/a  Recent Labs: 12/07/2020: Magnesium 1.8 03/13/2021: ALT 19; B Natriuretic Peptide 398.8; BUN 21; Creatinine, Ser 0.95; Hemoglobin 13.2; Platelets 352; Potassium 4.1; Sodium 136   Recent Lipid Panel Lab Results  Component Value Date/Time   CHOL 180 12/10/2020 05:11 AM   CHOL 257 (H) 07/16/2020 09:04 AM   TRIG 147 12/10/2020 05:11 AM   HDL 40 (L) 12/10/2020 05:11 AM   HDL 49 07/16/2020 09:04 AM   LDLCALC 111 (H) 12/10/2020 05:11 AM   LDLCALC 163 (H) 07/16/2020 09:04 AM      Risk Assessment/Calculations:          Physical Exam:    VS:  BP (!) 118/54   Pulse 73   Ht _0  (1.575 m)   Wt 115 lb 3.2 oz (52.3 kg)   SpO2 98%   BMI 21.07 kg/m     Wt Readings from Last 3 Encounters:  05/22/21 115 lb 3.2 oz (52.3 kg)  03/20/21 112 lb 6.4 oz (51 kg)  01/31/21 110 lb (49.9 kg)    Constitutional:      Appearance: Healthy appearance. Not in distress.  Neck:     Vascular: No JVR. JVD normal.  Pulmonary:     Effort: Pulmonary effort is normal.     Breath sounds: No wheezing. No rales.  Cardiovascular:     Normal rate. Regular rhythm. Normal S1. Normal S2.      Murmurs: There is no murmur.  Edema:    Peripheral edema absent.  Abdominal:     Palpations: Abdomen is soft.  Skin:    General: Skin is warm and dry.  Neurological:     Mental Status: Alert and oriented to person, place and time.     Cranial Nerves: Cranial nerves are intact.       Medication Adjustments/Labs and Tests Ordered: Current medicines are reviewed at length with the patient today.  Concerns regarding medicines are outlined above.  Tests Ordered: Orders Placed This Encounter  Procedures   Lipid Profile   Medication Changes: No orders of the defined types were placed in this encounter.  Signed, Richardson Dopp, PA-C  05/22/2021 12:08 PM    [facility] [address], [place], [place]  [address] Phone: [phone]; Fax: [phone]

## 2021-05-22 ENCOUNTER — Ambulatory Visit (INDEPENDENT_AMBULATORY_CARE_PROVIDER_SITE_OTHER): Payer: Medicare Other | Admitting: Physician Assistant

## 2021-05-22 ENCOUNTER — Encounter: Payer: Self-pay | Admitting: Physician Assistant

## 2021-05-22 ENCOUNTER — Other Ambulatory Visit: Payer: Self-pay

## 2021-05-22 VITALS — BP 118/54 | HR 73 | Ht 62.0 in | Wt 115.2 lb

## 2021-05-22 DIAGNOSIS — E785 Hyperlipidemia, unspecified: Secondary | ICD-10-CM

## 2021-05-22 DIAGNOSIS — I10 Essential (primary) hypertension: Secondary | ICD-10-CM | POA: Diagnosis not present

## 2021-05-22 DIAGNOSIS — I25119 Atherosclerotic heart disease of native coronary artery with unspecified angina pectoris: Secondary | ICD-10-CM

## 2021-05-22 DIAGNOSIS — K219 Gastro-esophageal reflux disease without esophagitis: Secondary | ICD-10-CM | POA: Diagnosis not present

## 2021-05-22 DIAGNOSIS — I340 Nonrheumatic mitral (valve) insufficiency: Secondary | ICD-10-CM

## 2021-05-22 DIAGNOSIS — I5020 Unspecified systolic (congestive) heart failure: Secondary | ICD-10-CM | POA: Diagnosis not present

## 2021-05-22 DIAGNOSIS — I2510 Atherosclerotic heart disease of native coronary artery without angina pectoris: Secondary | ICD-10-CM

## 2021-05-22 NOTE — Assessment & Plan Note (Signed)
History of non-STEMI in 10/21 treated with a DES to the mid RCA and DES to the D1.  Cardiac catheterization in May 2022 demonstrated patent stents in the diagonal and RCA.  She does have moderate disease in LAD, mid to distal RCA and high-grade stenosis in a small AV groove circumflex.  She has been managed medically.  She is currently doing well without anginal symptoms.  Continue aspirin 81 mg daily, clopidogrel 75 mg daily, bisoprolol 2.5 mg daily.  She has been intolerant to statins.

## 2021-05-22 NOTE — Assessment & Plan Note (Signed)
She notes no further chest pain since changing from Prilosec to Protonix.

## 2021-05-22 NOTE — Assessment & Plan Note (Addendum)
EF in May was 25-30.  This has improved to 40-45 on most recent echocardiogram.  She is NYHA II.  Volume status appears stable.  Low blood pressure and medication intolerances have limited GDMT.  She could not tolerate isosorbide.  Overall, she is currently doing well.  At this point, I do not think we should attempt starting her on SGLT2 inhibitor.  Continue bisoprolol 2.5 mg daily, spironolactone 12.5 mg daily, furosemide 40 mg daily.  Follow-up in 6 months.

## 2021-05-22 NOTE — Assessment & Plan Note (Signed)
She has been intolerant of statins and ezetimibe.  We discussed the importance of getting LDL below 70 to reduce risk of future events.  She feels that she has changed her diet and would like to check her cholesterol again before proceeding with alternative therapies.  Arrange fasting lipids.  If her LDL is still above 70, refer to the lipid clinic for consideration of PCSK9 inhibitor therapy.

## 2021-05-22 NOTE — Assessment & Plan Note (Signed)
Repeat blood pressure by me was 130/70 on the right and 124/68 on the left.  Blood pressure is well controlled.  Continue current dose of bisoprolol, spironolactone.

## 2021-05-22 NOTE — Assessment & Plan Note (Signed)
Mild to mod on echocardiogram in 5/22.  This improved on most recent echocardiogram to mild MR.  Consider repeat echocardiogram in 1 year.

## 2021-05-22 NOTE — Patient Instructions (Signed)
Medication Instructions:   Your physician recommends that you continue on your current medications as directed. Please refer to the Current Medication list given to you today.  *If you need a refill on your cardiac medications before your next appointment, please call your pharmacy*   Lab Work:  Your physician recommends that you return for a FASTING lipid profile:You can come in the day of appointment between 7:30-4:30 fasting from midnight the night before. Wednesday, November 9.    If you have labs (blood work) drawn today and your tests are completely normal, you will receive your results only by: [place] (if you have MyChart) OR A paper copy in the mail If you have any lab test that is abnormal or we need to change your treatment, we will call you to review the results.   Testing/Procedures:  -NONE   Follow-Up: At [facility], you and your health needs are our priority.  As part of our continuing mission to provide you with exceptional heart care, we have created designated Provider Care Teams.  These Care Teams include your primary Cardiologist (physician) and Advanced Practice Providers (APPs -  Physician Assistants and Nurse Practitioners) who all work together to provide you with the care you need, when you need it.  We recommend signing up for the patient portal called "MyChart".  Sign up information is provided on this After Visit Summary.  MyChart is used to connect with patients for Virtual Visits (Telemedicine).  Patients are able to view lab/test results, encounter notes, upcoming appointments, etc.  Non-urgent messages can be sent to your provider as well.   To learn more about what you can do with MyChart, go to [email or web address].    Your next appointment:   6 month(s)  The format for your next appointment:   In Person  Provider:   You may see Freada Bergeron, MD or one of the following Advanced Practice Providers on your designated Care Team:    Richardson Dopp, PA-C  Other Instructions  Your physician wants you to follow-up in: 6 months with Dr. Johney Frame or Richardson Dopp, PA-C. You will receive a reminder letter in the mail two months in advance. If you don't receive a letter, please call our office to schedule the follow-up appointment.

## 2021-05-27 DIAGNOSIS — Z96641 Presence of right artificial hip joint: Secondary | ICD-10-CM | POA: Diagnosis not present

## 2021-05-27 DIAGNOSIS — M7061 Trochanteric bursitis, right hip: Secondary | ICD-10-CM | POA: Diagnosis not present

## 2021-05-29 ENCOUNTER — Other Ambulatory Visit: Payer: Self-pay

## 2021-05-29 ENCOUNTER — Other Ambulatory Visit: Payer: Medicare Other

## 2021-05-29 DIAGNOSIS — I25119 Atherosclerotic heart disease of native coronary artery with unspecified angina pectoris: Secondary | ICD-10-CM

## 2021-05-29 DIAGNOSIS — E785 Hyperlipidemia, unspecified: Secondary | ICD-10-CM

## 2021-05-29 DIAGNOSIS — I10 Essential (primary) hypertension: Secondary | ICD-10-CM | POA: Diagnosis not present

## 2021-05-29 DIAGNOSIS — I5020 Unspecified systolic (congestive) heart failure: Secondary | ICD-10-CM | POA: Diagnosis not present

## 2021-05-29 LAB — LIPID PANEL
Chol/HDL Ratio: 5.2 ratio — ABNORMAL HIGH (ref 0.0–4.4)
Cholesterol, Total: 258 mg/dL — ABNORMAL HIGH (ref 100–199)
HDL: 50 mg/dL (ref >39)
LDL Chol Calc (NIH): 180 mg/dL — ABNORMAL HIGH (ref 0–99)
Triglycerides: 151 mg/dL — ABNORMAL HIGH (ref 0–149)
VLDL Cholesterol Cal: 28 mg/dL (ref 5–40)

## 2021-05-31 ENCOUNTER — Other Ambulatory Visit: Payer: Self-pay | Admitting: *Deleted

## 2021-05-31 DIAGNOSIS — E785 Hyperlipidemia, unspecified: Secondary | ICD-10-CM

## 2021-06-04 ENCOUNTER — Other Ambulatory Visit: Payer: Self-pay | Admitting: *Deleted

## 2021-06-04 ENCOUNTER — Other Ambulatory Visit: Payer: Self-pay

## 2021-06-04 NOTE — Patient Outreach (Signed)
[facility]) Care Management  06/04/2021  Patricia Garcia [birth year] [id number]   [place] outreach to complex care patient   Referral Date: 12/12/20 Referral Source: [facility] liaison Referral Reason: complex care and disease management follow up calls and assess for further needs.SDOH - patient requested [facility] meals Insurance: [employer] Medicare and blue cross and blue shield supplement       Outreach to patient    Patient is able to verify HIPAA identifiers   Assessment  Fall  04/26/21 in son garage  Increased walking and eating  Has gained 3 lbs Detail explanation of recent lipid panel, referred to cardiologist who wants to start cholesterol medication State she has been taking in 2 eggs a day for breakfast, ice cream and milk shakes as she is trying to increase her weight She is aware that she has a history of side effects from statins She will consider Nexlizet, repatha. She was referred to her MD office staff Her preference is not to be on statin medication, to work with a nutritionist, increase exercise and possibly see if she can return to cardiac rehab Still taking in boost Encouraged to read food labels  Encouraged her to speak with pcp about nutritional consult  To see pcp 06/07/21   Progression discussed Offered [name] dises Patient Active Problem List   Diagnosis Date Noted   Mitral regurgitation 05/22/2021   HFmrEF (heart failure with mildly reduced ejection fraction) ([place]) 05/01/2021   Respiratory failure with hypoxia ([place]) 12/06/2020   Nausea and vomiting 12/06/2020   Prolonged QT interval 12/06/2020   Hyponatremia 12/06/2020   Nephrolithiasis 12/06/2020   History of total hip arthroplasty 10/18/2020   Trochanteric bursitis of right hip 10/18/2020   Mild major depression, single episode ([place]) 09/26/2020   Pain of right hip joint 08/07/2020   Allergic rhinitis due to pollen 08/02/2020   Coronary artery disease involving native coronary  artery of native heart with angina pectoris ([place]) 08/02/2020   Chronic kidney disease, stage 3a ([place]) 08/02/2020   Hyperlipidemia LDL goal <70 08/02/2020   Hardening of the aorta (main artery of the heart) ([place]) 08/02/2020   Hearing loss 08/02/2020   Hypertensive renal disease 08/02/2020   Intestinal malabsorption 08/02/2020   Ischemic cardiomyopathy 08/02/2020   Primary localized osteoarthritis of pelvic region and thigh 08/02/2020   Spondylosis without myelopathy 08/02/2020   History of non-ST elevation myocardial infarction (NSTEMI) 05/07/2020   Postoperative anemia 05/07/2020   Acute respiratory failure with hypoxia ([place]) 05/06/2020   LBBB (left bundle branch block) 05/06/2020   Sinus congestion 05/06/2020   Femur fracture, right ([place]) 04/30/2020   Fracture of neck of femur ([place]) 04/30/2020   Chronic abdominal pain 09/23/2011   Gas pain 09/23/2011   Constipation, slow transit 09/23/2011   Anorectal pain 09/23/2011   GASTRITIS 06/26/2010   Slow transit constipation 06/21/2010   RECTAL BLEEDING 06/21/2010   Vitamin D deficiency 06/20/2010   Generalized anxiety disorder 06/20/2010   GLAUCOMA 06/20/2010   Cataract 06/20/2010   Essential hypertension 06/20/2010   Chronic sinusitis 06/20/2010   GERD 06/20/2010   Disorder of skeletal system 06/20/2010   Abdominal pain 06/20/2010   MICROALBUMINURIA 06/20/2010   Plan Patient agrees to care plan and follow up within the next 90 business days    Patricia Garcia L. Lavina Hamman, RN, BSN, [place] Coordinator Office number [phone] [facility] number [phone] Fax number [phone]

## 2021-06-10 DIAGNOSIS — I5042 Chronic combined systolic (congestive) and diastolic (congestive) heart failure: Secondary | ICD-10-CM | POA: Diagnosis not present

## 2021-06-10 DIAGNOSIS — I130 Hypertensive heart and chronic kidney disease with heart failure and stage 1 through stage 4 chronic kidney disease, or unspecified chronic kidney disease: Secondary | ICD-10-CM | POA: Diagnosis not present

## 2021-06-10 DIAGNOSIS — N1831 Chronic kidney disease, stage 3a: Secondary | ICD-10-CM | POA: Diagnosis not present

## 2021-06-17 NOTE — Progress Notes (Signed)
Patient ID: [name]                 DOB: 03-23-1933                    MRN: [id number]     HPI: Patricia Garcia is a 87 y.o. female patient of Dr. Johney Frame referred to lipid clinic by Richardson Dopp, PA-C. PMH is significant for CAD (NSTEMI 10/21) s/p DES to [facility] and D1, cath 5/22 patient stents, high grade disease in Cfx, medical management recommended; HFmrEF (GDMT limited by BP) with EF 25-30% 5/22, now 40-45% as of 10/22, angina, HTN, HLD, LBBB, depression, anemia.   Today, patient arrives in good spirits, states she has had problems with cholesterol medications in the past. She is very happy with the regimen Richardson Dopp has put her on now for her heart because her EF has improved and she is feeling better. She endorses a healthy lifestyle. She does not have a Medicare Part D (prescription drug) plan.   Current Medications: none Intolerances: statins, ezetimibe - nonspecific side effects Risk Factors: NSTEMI, HF, HTN LDL goal: <70 mg/dL  Diet: eats lean meats like chicken and fish, avoids pork and fast foods  Exercise: walks around neighborhood   Family History: The patient's family history includes Cancer in her father; Colon polyps in her mother; Heart disease in her father.  Social History: Former smoker (quit 1985)  Labs: 05/29/21: TC 258, TG 151, HDL 50, LDL 180 (no meds)  Past Medical History:  Diagnosis Date   Anal or rectal pain    Anemia    Arthritis    CAD (coronary artery disease)    a. NSTEMI 04/2020 s/p DES to [facility] and D1.   Depression    Diverticulosis of colon (without mention of hemorrhage)    HFrEF (heart failure with reduced ejection fraction) ([place])    Echo 5/22: EF 25-30 // Echocardiogram 10/22: EF 40-45, mild LVH, Gr 1 DD, inf and inf-sept HK, normal RVSF, mild LAE, mild MR   Hypertension    Hypoalbuminemia    Internal hemorrhoids without mention of complication    Ischemic heart disease    LBBB (left bundle branch block)    Osteopenia     Renal insufficiency    S/P right hip fracture    Slow transit constipation     Current Outpatient Medications on File Prior to Visit  Medication Sig Dispense Refill   albuterol (VENTOLIN HFA) 108 (90 Base) MCG/ACT inhaler Inhale 1-2 puffs into the lungs every 6 (six) hours as needed for wheezing or shortness of breath. 18 g 0   aspirin 81 MG EC tablet Take 81 mg by mouth daily. Swallow whole.     bisoprolol (ZEBETA) 5 MG tablet Take 0.5 tablets (2.5 mg total) by mouth daily. 45 tablet 2   Calcium Carbonate-Vit D-Min (CALCIUM 1200 PO) Take 1,200 mg by mouth daily.     Cholecalciferol (VITAMIN D-3 PO) Take 1 capsule by mouth daily after supper.     clopidogrel (PLAVIX) 75 MG tablet Take 1 tablet (75 mg total) by mouth daily. 90 tablet 3   docusate sodium (COLACE) 100 MG capsule Take 100 mg by mouth in the morning.     ferrous sulfate 325 (65 FE) MG tablet Take 325 mg by mouth every Monday, Wednesday, and Friday.     furosemide (LASIX) 40 MG tablet Take 1 tablet (40 mg total) by mouth daily. 90 tablet 2   latanoprost (XALATAN)  0.005 % ophthalmic solution Place 1 drop into both eyes at bedtime.     Multiple Vitamins-Minerals (MULTIVITAMIN ADULTS 50+) TABS Take 1 tablet by mouth daily with breakfast.     nitroGLYCERIN (NITROSTAT) 0.4 MG SL tablet Place 1 tablet (0.4 mg total) under the tongue every 5 (five) minutes as needed for chest pain. 25 tablet 3   pantoprazole (PROTONIX) 40 MG tablet Take 1 tablet (40 mg total) by mouth daily. 37 tablet 3   sodium chloride (MURO 128) 2 % ophthalmic solution Place 1 drop into both eyes 2 (two) times daily.     spironolactone (ALDACTONE) 25 MG tablet Take 0.5 tablets (12.5 mg total) by mouth daily. 45 tablet 2   No current facility-administered medications on file prior to visit.    Allergies  Allergen Reactions   Imdur [Isosorbide Nitrate] Nausea Only   Alendronate Sodium Other (See Comments)    GI symptoms   Doxycycline Nausea And Vomiting     12/04/20 Pt prefers not to receive Doxycycline for infections   Eucalyptus Oil Other (See Comments)    Affects the sinus cavity- causes congestion   Linaclotide Other (See Comments)    Reaction not recalled   Medrol [Methylprednisolone] Other (See Comments)    Nervousness and cramps in both feet/ankles   Penicillamine Other (See Comments)    Reaction not recalled- was years ago   Penicillins Other (See Comments)    Reaction not recalled- was years ago   Prednisone Other (See Comments)    "Irritable and shaking"   Risedronate Other (See Comments)    Heartburn   Statins Other (See Comments)    "Shaking"   Statins Support [A-G Pro] Other (See Comments)    Visual changes, general aches   Statins Support [Acid Blockers Support] Other (See Comments)    Visual changes, general aches   Sulfa Antibiotics Other (See Comments)    Reaction not recalled   Sulfonamide Derivatives Other (See Comments)    Reaction not recalled   Tramadol Other (See Comments)    Reaction not recalled   Latex Rash   Tape Rash and Other (See Comments)    EKG LEANS MUST BE THE SENSITIVE ONES- otherwise, a rash occurs    Assessment/Plan:  1. Hyperlipidemia - Most recent LDL of 180 mg/dL not at goal <70 mg/dL. She is intolerant to multiple statins and ezetimibe and is not currently on any lipid lowering therapy. She does not have a Medicare Part D plan so [id number] would be very expensive. She does have a supplement plan F which may cover Leqvio well. Leqvio would lower LDL by ~50%. She is willing to try this. It is given as an injection at month 0, 3, then every 6 months thereafter. We have faxed a coverage determination form to see how her insurance covers this and will move forward from there. Counseled her to continue her positive diet and exercise changes.    Rebbeca Paul, PharmD PGY2 Ambulatory Care Pharmacy Resident 06/18/2021 12:25 PM

## 2021-06-18 ENCOUNTER — Ambulatory Visit (INDEPENDENT_AMBULATORY_CARE_PROVIDER_SITE_OTHER): Payer: Medicare Other | Admitting: Student-PharmD

## 2021-06-18 ENCOUNTER — Other Ambulatory Visit: Payer: Self-pay

## 2021-06-18 DIAGNOSIS — E785 Hyperlipidemia, unspecified: Secondary | ICD-10-CM | POA: Diagnosis not present

## 2021-06-18 DIAGNOSIS — I25119 Atherosclerotic heart disease of native coronary artery with unspecified angina pectoris: Secondary | ICD-10-CM | POA: Diagnosis not present

## 2021-06-18 DIAGNOSIS — I2510 Atherosclerotic heart disease of native coronary artery without angina pectoris: Secondary | ICD-10-CM | POA: Diagnosis not present

## 2021-06-18 NOTE — Patient Instructions (Addendum)
Nice to see you today!  Keep up the good work with diet and exercise. Aim for a diet full of vegetables, fruit and lean meats (chicken, [place], fish). Try to limit carbs (bread, pasta, sugar, rice) and red meat consumption.  Your goal LDL is less than 70 mg/dL, you're currently at 180 mg/dL  Medication Changes: We are checking on your coverage for Leqvio which is the medication that is injected once, 3 months later, then every 6 months after that.   Please give [place] a call at [phone] with any questions or concerns.

## 2021-06-19 ENCOUNTER — Telehealth: Payer: Self-pay | Admitting: Pharmacist

## 2021-06-19 NOTE — Telephone Encounter (Signed)
Pt left voicemail stating that she will not start Leqvio due to fear over potential side effects and will "lower her cholesterol naturally." Her LDL is 180 and her goal is < 70 due to history of MI. Lifestyle changes will not bring LDL anywhere near her goal. Previously intolerant to multiple statins and ezetimibe. Did receive benefit determination for Leqvio and was advised that insurance will cover the cost of Leqvio in full. Called pt to discuss. Reviewed benefits of LDL lowering and that lifestyle changes will not bring her LDL to goal. She still does not wish to try Leqvio. Advised her to call clinic if she changes her mind.

## 2021-07-01 DIAGNOSIS — J00 Acute nasopharyngitis [common cold]: Secondary | ICD-10-CM | POA: Diagnosis not present

## 2021-07-02 ENCOUNTER — Telehealth: Payer: Self-pay | Admitting: Cardiology

## 2021-07-02 NOTE — Telephone Encounter (Signed)
Patient states she has been exposed to someone who tested positive for COVID and she has been experiencing cold symptoms. She would like to know if Dr. Johney Frame has any recommendations for medication. Please advise.

## 2021-07-02 NOTE — Telephone Encounter (Signed)
Pt called to report that she recently tested positive via home test, for covid.  She states her symptoms are mild, mostly cold like symptoms. She is inquiring what OTC meds she can safely take for this.  Advised the pt that she can take Tylenol as directed for fever and pain. Advised her she can use saline nasal spray for stuffiness.  Advised her she can use Coricidin as needed for cough and as directed on the bottle. Also advised her she can use Mucinex WITHOUT D as directed.  Advised her to stay hydrated and get plenty of rest.  Pt verbalized understanding and agrees with this plan.  Pt was more than gracious for all the assistance provided.

## 2021-07-03 DIAGNOSIS — U071 COVID-19: Secondary | ICD-10-CM | POA: Diagnosis not present

## 2021-07-05 DIAGNOSIS — U071 COVID-19: Secondary | ICD-10-CM | POA: Diagnosis not present

## 2021-07-08 DIAGNOSIS — H16203 Unspecified keratoconjunctivitis, bilateral: Secondary | ICD-10-CM | POA: Diagnosis not present

## 2021-07-17 DIAGNOSIS — R54 Age-related physical debility: Secondary | ICD-10-CM | POA: Diagnosis not present

## 2021-07-17 DIAGNOSIS — I255 Ischemic cardiomyopathy: Secondary | ICD-10-CM | POA: Diagnosis not present

## 2021-07-17 DIAGNOSIS — I110 Hypertensive heart disease with heart failure: Secondary | ICD-10-CM | POA: Diagnosis not present

## 2021-07-17 DIAGNOSIS — D649 Anemia, unspecified: Secondary | ICD-10-CM | POA: Diagnosis not present

## 2021-07-17 DIAGNOSIS — I5042 Chronic combined systolic (congestive) and diastolic (congestive) heart failure: Secondary | ICD-10-CM | POA: Diagnosis not present

## 2021-07-17 DIAGNOSIS — H409 Unspecified glaucoma: Secondary | ICD-10-CM | POA: Diagnosis not present

## 2021-07-17 DIAGNOSIS — I252 Old myocardial infarction: Secondary | ICD-10-CM | POA: Diagnosis not present

## 2021-07-17 DIAGNOSIS — I2510 Atherosclerotic heart disease of native coronary artery without angina pectoris: Secondary | ICD-10-CM | POA: Diagnosis not present

## 2021-07-24 DIAGNOSIS — J309 Allergic rhinitis, unspecified: Secondary | ICD-10-CM | POA: Diagnosis not present

## 2021-07-24 DIAGNOSIS — H6123 Impacted cerumen, bilateral: Secondary | ICD-10-CM | POA: Diagnosis not present

## 2021-07-24 DIAGNOSIS — R54 Age-related physical debility: Secondary | ICD-10-CM | POA: Diagnosis not present

## 2021-07-25 DIAGNOSIS — H04123 Dry eye syndrome of bilateral lacrimal glands: Secondary | ICD-10-CM | POA: Diagnosis not present

## 2021-07-25 DIAGNOSIS — H401131 Primary open-angle glaucoma, bilateral, mild stage: Secondary | ICD-10-CM | POA: Diagnosis not present

## 2021-07-25 DIAGNOSIS — H26492 Other secondary cataract, left eye: Secondary | ICD-10-CM | POA: Diagnosis not present

## 2021-07-25 DIAGNOSIS — H18593 Other hereditary corneal dystrophies, bilateral: Secondary | ICD-10-CM | POA: Diagnosis not present

## 2021-08-01 ENCOUNTER — Other Ambulatory Visit: Payer: Self-pay | Admitting: Physician Assistant

## 2021-08-06 ENCOUNTER — Encounter: Payer: Self-pay | Admitting: *Deleted

## 2021-08-06 ENCOUNTER — Other Ambulatory Visit: Payer: Self-pay

## 2021-08-06 ENCOUNTER — Other Ambulatory Visit: Payer: Self-pay | Admitting: *Deleted

## 2021-08-06 DIAGNOSIS — H43811 Vitreous degeneration, right eye: Secondary | ICD-10-CM | POA: Diagnosis not present

## 2021-08-06 DIAGNOSIS — H3500 Unspecified background retinopathy: Secondary | ICD-10-CM | POA: Diagnosis not present

## 2021-08-06 NOTE — Patient Outreach (Signed)
[facility]) Care Management Telephonic RN Care Manager Note   08/06/2021 Name:  Patricia Garcia MRN:  [id number] DOB:  29-Jan-1933  Summary: Pt left a message to reschedule her 08/06/21 appointment and RN CM returned her call She shared her recent covid experience, recovery and recent eye concerns that are being addressed today at 2 pm, therefore leading to reschedule of her [facility] outreach New concern ear pain (resolved) fluid, wax seen by pcp recently  CAD/Hyperlipidemia (HLD) Cholesterol medicines not preferred  Now on fish oil bid Did not get a nutritionist referral and does not prefer one  congestive Heart Failure (CHF) Feeling better and stronger walking in stores for exercise  Has gained weight 2-3 lbs BMI remains under 23 No edema noted  Diabetes managed well A1c remains below 7%    Socialization She did check into the senior services of [place] but prefers to still socialize with her friend Delorise Shiner but she is noticing he is having some memory concern   Recommendations/Changes made from today's visit: Recommended medicare.gov to assist her with finding an audiologist Discussed THN progression PT has the option to access her pcp telephonic outreaches  Subjective: Patricia Garcia is an 88 y.o. year old female who is a primary patient of Darrol Jump, NP. The care management team was consulted for assistance with care management and/or care coordination needs.    Telephonic RN Care Manager completed Telephone Visit today.   Objective:  Medications Reviewed Today     Reviewed by Patricia Garcia (Physician Assistant) on 05/22/21 at 1208  Med List Status: <None>   Medication Order Taking? Sig Documenting Provider Last Dose Status Informant  albuterol (VENTOLIN HFA) 108 (90 Base) MCG/ACT inhaler [id number] Yes Inhale 1-2 puffs into the lungs every 6 (six) hours as needed for wheezing or shortness of breath. Wieters, [place] C, PA-C Taking Active Self   aspirin 81 MG EC tablet [id number] Yes Take 81 mg by mouth daily. Swallow whole. [provider] Taking Active   bisoprolol (ZEBETA) 5 MG tablet [id number] Yes Take 0.5 tablets (2.5 mg total) by mouth daily. Loel Dubonnet, NP Taking Active Self  Calcium Carbonate-Vit D-Min (CALCIUM 1200 PO) [id number] Yes Take 1,200 mg by mouth daily. [provider] Taking Active   Cholecalciferol (VITAMIN D-3 PO) [id number] Yes Take 1 capsule by mouth daily after supper. [provider] Taking Active Self  clopidogrel (PLAVIX) 75 MG tablet [id number] Yes Take 1 tablet (75 mg total) by mouth daily. Freada Bergeron, MD Taking Active Self  docusate sodium (COLACE) 100 MG capsule [id number] Yes Take 100 mg by mouth in the morning. [provider] Taking Active Self           Med Note Florinda Marker Oct 15, 2020  1:10 PM)    ferrous sulfate 325 (65 FE) MG tablet [id number] Yes Take 325 mg by mouth every Monday, Wednesday, and Friday. [provider] Taking Active Self  furosemide (LASIX) 40 MG tablet [id number] Yes Take 1 tablet (40 mg total) by mouth daily. Loel Dubonnet, NP Taking Active Self  latanoprost (XALATAN) 0.005 % ophthalmic solution [id number] Yes Place 1 drop into both eyes at bedtime. [provider] Taking Active Self  Multiple Vitamins-Minerals (MULTIVITAMIN ADULTS 50+) TABS [id number] Yes Take 1 tablet by mouth daily with breakfast. [provider] Taking Active Self  nitroGLYCERIN (NITROSTAT) 0.4 MG SL tablet [id number] Yes Place 1 tablet (0.4 mg total) under the tongue every  5 (five) minutes as needed for chest pain. Richardson Dopp T, PA-C Taking Active   pantoprazole (PROTONIX) 40 MG tablet [id number] Yes Take 1 tablet (40 mg total) by mouth daily. Richardson Dopp T, PA-C Taking Active   sodium chloride (MURO 128) 2 % ophthalmic solution [id number] Yes Place 1 drop into both eyes 2 (two) times daily. [provider] Taking  Active Self  spironolactone (ALDACTONE) 25 MG tablet [id number] Yes Take 0.5 tablets (12.5 mg total) by mouth daily. Loel Dubonnet, NP Taking Active Self             SDOH:  (Social Determinants of Health) assessments and interventions performed:  SDOH Interventions    Flowsheet Row Most Recent Value  SDOH Interventions   Food Insecurity Interventions Intervention Not Indicated  Financial Strain Interventions Intervention Not Indicated  Housing Interventions Intervention Not Indicated  Intimate Partner Violence Interventions Intervention Not Indicated  Stress Interventions Intervention Not Indicated  Transportation Interventions Intervention Not Indicated       Care Plan  Review of patient past medical history, allergies, medications, health status, including review of consultants reports, laboratory and other test data, was performed as part of comprehensive evaluation for care management services.   Care Plan : General Plan of Care (Adult)  Updates made by Barbaraann Faster, RN since 08/06/2021 12:00 AM  Completed 08/06/2021   Care Plan : Fall Risk (Adult)  Updates made by Barbaraann Faster, RN since 08/06/2021 12:00 AM  Completed 08/06/2021   Care Plan : Heart Failure (Adult)  Updates made by Barbaraann Faster, RN since 08/06/2021 12:00 AM  Completed 08/06/2021   Care Plan : Wellness (Adult)  Updates made by Barbaraann Faster, RN since 08/06/2021 12:00 AM  Completed 08/06/2021   Problem: Healthy Nutrition (Wellness) Resolved 08/06/2021  Priority: High  Onset Date: 12/14/2020     Care Plan : [place] of Care  Updates made by Barbaraann Faster, RN since 08/06/2021 12:00 AM  Completed 08/06/2021   Problem: Complex Care Coordination Needs and disease management in patient with CHF, HDL, CAD Resolved 08/06/2021  Priority: High     Problem: Complex Care Coordination Needs and disease management in patient with CAD, CHF, DM Resolved 08/06/2021  Priority: High      Long-Range Goal: Establish Plan of Care for Management Complex SDOH Barriers, disease management and Care Coordination Needs in patient with CAD, CHF, DM Completed 08/06/2021  Start Date: 06/06/2021  This Visit's Progress: On track  Priority: High  Note:   Current Barriers:  Knowledge Deficits related to plan of care for management of CHF, CAD, and DMII  Care Coordination needs related to Limited education about CHF, CAD, DM* 04/26/21 fell in son garage  06/06/21 Has gained 3 lbs She is aware that she has a history of side effects from statins She will consider Nexlizet, Repatha. She was referred to her MD office staff Her preference is not to be on statin medication, to work with a nutritionist, increase exercise and possibly see if she can return to cardiac rehab Still taking in boost    RN CM Clinical Goal(s):  Patient will verbalize understanding of plan for management of CHF, CAD, and DMII as evidenced by improved values for DM, CAD, CHF  through collaboration with RN Care manager, provider, and care team.  08/06/21 Goals met- agrees to case closure with transfer to her pcp monthly telephonic RN outreaches and prefers to outreach to RN CM prn Does not prefer  [facility] disease management services quarterly  Interventions: Outreaches for care coordination and disease management/education Inter-disciplinary care team collaboration (see longitudinal plan of care) Evaluation of current treatment plan related to  self management and patient's adherence to plan as established by provider 06/06/21 Detail explanation of recent lipid panel, referred to cardiologist who wants to start cholesterol medication Sent EMMIs 08/06/21 to assist her with understanding female friend's memory concerns -Dementia basics, dementia caregiver- discussed neurologist standard medication tx 08/06/21 reviewed THN progression- Pt agreed to case closure vs [facility] health coach/disease management She wants to use the services offered  to her at her pcp office and outreach to [facility] prn Encouraged to read food labels Encouraged her to speak with pcp about nutritional consult    CAD Interventions: (Status:  Goal Met.) Long Term Goal Assessed understanding of CAD diagnosis Medications reviewed including medications utilized in CAD treatment plan Provided education on importance of blood pressure control in management of CAD Provided education on Importance of limiting foods high in cholesterol Counseled on importance of regular laboratory monitoring as prescribed Reviewed Importance of taking all medications as prescribed Reviewed Importance of attending all scheduled provider appointments Screening for signs and symptoms of depression related to chronic disease state Assessed social determinant of health barriers   Heart Failure Interventions:  (Status:  Goal Met.) Short Term Goal Basic overview and discussion of pathophysiology of Heart Failure reviewed Provided education on low sodium diet Reviewed Heart Failure Action Plan in depth and provided written copy Assessed need for readable accurate scales in home Advised patient to weigh each morning after emptying bladder Discussed importance of daily weight and advised patient to weigh and record daily Reviewed role of diuretics in prevention of fluid overload and management of heart failure; Discussed the importance of keeping all appointments with provider  Diabetes Interventions:  (Status:  Goal Met.) Short Term Goal Assessed patient's understanding of A1c goal: <6.5% Provided education to patient about basic DM disease process Reviewed medications with patient and discussed importance of medication adherence Discussed plans with patient for ongoing care management follow up and provided patient with direct contact information for care management team Review of patient status, including review of consultants reports, relevant laboratory and other test results, and  medications completed Lab Results  Component Value Date   HGBA1C 5.7 (H) 05/08/2020   Patient Goals/Self-Care Activities: Take all medications as prescribed Attend all scheduled provider appointments Call pharmacy for medication refills 3-7 days in advance of running out of medications Attend church or other social activities Perform all self care activities independently  Perform IADL's (shopping, preparing meals, housekeeping, managing finances) independently Call provider office for new concerns or questions  06/04/21 Increase po intake, monitor weight  Follow Up Plan:  The patient has been provided with contact information for the care management team and has been advised to call with any health related questions or concerns.  Mrs Milhouse agrees to [facility] case closure as she will utilize her pcp RN CM program offered to her and outreach to [facility] prn        Plan: The patient has been provided with contact information for the care management team and has been advised to call with any health related questions or concerns.  Mrs Lorensen agree to [facility] case closure as she utilizes her pcp RN CM program offered to her   Joelene Millin L. Lavina Hamman, RN, BSN, [place] Coordinator Office number [phone] [facility] number [phone] Fax number [phone]

## 2021-08-14 DIAGNOSIS — Z961 Presence of intraocular lens: Secondary | ICD-10-CM | POA: Diagnosis not present

## 2021-08-14 DIAGNOSIS — H47021 Hemorrhage in optic nerve sheath, right eye: Secondary | ICD-10-CM | POA: Diagnosis not present

## 2021-08-14 DIAGNOSIS — H26492 Other secondary cataract, left eye: Secondary | ICD-10-CM | POA: Diagnosis not present

## 2021-08-19 ENCOUNTER — Other Ambulatory Visit: Payer: Self-pay | Admitting: *Deleted

## 2021-08-19 DIAGNOSIS — I5042 Chronic combined systolic (congestive) and diastolic (congestive) heart failure: Secondary | ICD-10-CM | POA: Diagnosis not present

## 2021-08-19 DIAGNOSIS — H409 Unspecified glaucoma: Secondary | ICD-10-CM | POA: Diagnosis not present

## 2021-08-19 DIAGNOSIS — D649 Anemia, unspecified: Secondary | ICD-10-CM | POA: Diagnosis not present

## 2021-08-19 DIAGNOSIS — I2510 Atherosclerotic heart disease of native coronary artery without angina pectoris: Secondary | ICD-10-CM | POA: Diagnosis not present

## 2021-08-19 DIAGNOSIS — I252 Old myocardial infarction: Secondary | ICD-10-CM | POA: Diagnosis not present

## 2021-08-19 DIAGNOSIS — I255 Ischemic cardiomyopathy: Secondary | ICD-10-CM | POA: Diagnosis not present

## 2021-08-19 DIAGNOSIS — R54 Age-related physical debility: Secondary | ICD-10-CM | POA: Diagnosis not present

## 2021-08-19 DIAGNOSIS — I110 Hypertensive heart disease with heart failure: Secondary | ICD-10-CM | POA: Diagnosis not present

## 2021-08-19 MED ORDER — CLOPIDOGREL BISULFATE 75 MG PO TABS: 75.0000 mg | ORAL_TABLET | Freq: Every day | ORAL | 1 refills | Status: DC

## 2021-08-21 DIAGNOSIS — K219 Gastro-esophageal reflux disease without esophagitis: Secondary | ICD-10-CM | POA: Diagnosis not present

## 2021-08-21 DIAGNOSIS — R54 Age-related physical debility: Secondary | ICD-10-CM | POA: Diagnosis not present

## 2021-08-21 DIAGNOSIS — N1831 Chronic kidney disease, stage 3a: Secondary | ICD-10-CM | POA: Diagnosis not present

## 2021-08-21 DIAGNOSIS — I130 Hypertensive heart and chronic kidney disease with heart failure and stage 1 through stage 4 chronic kidney disease, or unspecified chronic kidney disease: Secondary | ICD-10-CM | POA: Diagnosis not present

## 2021-08-21 DIAGNOSIS — D649 Anemia, unspecified: Secondary | ICD-10-CM | POA: Diagnosis not present

## 2021-08-21 DIAGNOSIS — F419 Anxiety disorder, unspecified: Secondary | ICD-10-CM | POA: Diagnosis not present

## 2021-08-21 DIAGNOSIS — I5042 Chronic combined systolic (congestive) and diastolic (congestive) heart failure: Secondary | ICD-10-CM | POA: Diagnosis not present

## 2021-08-21 DIAGNOSIS — H409 Unspecified glaucoma: Secondary | ICD-10-CM | POA: Diagnosis not present

## 2021-08-21 DIAGNOSIS — I252 Old myocardial infarction: Secondary | ICD-10-CM | POA: Diagnosis not present

## 2021-08-22 DIAGNOSIS — H26492 Other secondary cataract, left eye: Secondary | ICD-10-CM | POA: Diagnosis not present

## 2021-08-30 ENCOUNTER — Telehealth: Payer: Self-pay | Admitting: Cardiology

## 2021-08-30 NOTE — Telephone Encounter (Signed)
Please advise on whether this is a side effect that will resolve or if she needs a medication adjustment! Thanks, Daphene Jaeger!

## 2021-08-30 NOTE — Telephone Encounter (Signed)
Both medications are diuretics so theoretically could make her eyes dry. Not sure which types of drops she is using but I recommend OTC lubricating eye drops such as artificial tears

## 2021-08-30 NOTE — Telephone Encounter (Signed)
Returned patients call with Pharmacy recommendation for OTC eye drops! Patient states that she already uses eye drops and it doesn't help much!   Patient endorses a stable weight and is actually losing weight!    Patient states that she would like to decrease from 40mg  to 20mg . Patient would like 90 day supplies sent in!   Will route to Dr. Johney Frame as she is primary for the patient!    Patient uses [facility]!

## 2021-08-30 NOTE — Telephone Encounter (Signed)
Pt c/o medication issue:  1. Name of Medication: furosemide (LASIX) 40 MG tablet  2. How are you currently taking this medication (dosage and times per day)? Take 1 tablet (40 mg total) by mouth daily.  3. Are you having a reaction (difficulty breathing--STAT)? Dry eyes and mouth  4. What is your medication issue? Pt says that this medication along with spironolactone (  spironolactone (ALDACTONE) 25 MG tablet Take 0.5 tablets (12.5 mg total) by mouth daily.  ) is giving her dry itchy eyes with no relief using eye drops... please advise

## 2021-08-30 NOTE — Telephone Encounter (Signed)
Pt has a question about her newly prescribed med please advise

## 2021-09-03 ENCOUNTER — Telehealth: Payer: Self-pay | Admitting: Cardiology

## 2021-09-03 MED ORDER — FUROSEMIDE 20 MG PO TABS: 20.0000 mg | ORAL_TABLET | Freq: Every day | ORAL | 2 refills | Status: DC

## 2021-09-03 NOTE — Telephone Encounter (Signed)
Pt c/o medication issue:  1. Name of Medication: furosemide (LASIX) 40 MG tablet  2. How are you currently taking this medication (dosage and times per day)? Take 1 tablet (40 mg total) by mouth daily  3. Are you having a reaction (difficulty breathing--STAT)? No   4. What is your medication issue? Patient would like for Dr. Johney Frame to reduce furosemide (LASIX) 40 MG tablet medication from 40 MG to 20 MG

## 2021-09-03 NOTE — Telephone Encounter (Signed)
Dr. Johney Frame, pt is inquiring if you would be ok reducing her lasix from 40 mg po daily to 20 mg po daily, for she states her weights are stable, and she is actually losing weight now.  Please advise on lasix dosing.  Will follow-up with the pt accordingly, once further advisement is provided.

## 2021-09-03 NOTE — Telephone Encounter (Signed)
Spoke with the pt and endorsed to her, that Dr. Johney Frame did give verbal order for [place] to decrease her lasix from 40 mg po daily to lasix 20 mg po daily. Confirmed the pharmacy of choice with the pt.  Advised her to keep an eye out on her weights and monitor, and let us know if she acutely gains weight (parameters reviewed with pt), so we can titrate her dose accordingly. Pt verbalized understanding and agrees with this plan.

## 2021-09-06 DIAGNOSIS — M542 Cervicalgia: Secondary | ICD-10-CM | POA: Diagnosis not present

## 2021-09-06 DIAGNOSIS — R42 Dizziness and giddiness: Secondary | ICD-10-CM | POA: Diagnosis not present

## 2021-09-10 DIAGNOSIS — I5042 Chronic combined systolic (congestive) and diastolic (congestive) heart failure: Secondary | ICD-10-CM | POA: Diagnosis not present

## 2021-09-10 DIAGNOSIS — H409 Unspecified glaucoma: Secondary | ICD-10-CM | POA: Diagnosis not present

## 2021-09-10 DIAGNOSIS — I252 Old myocardial infarction: Secondary | ICD-10-CM | POA: Diagnosis not present

## 2021-09-10 DIAGNOSIS — I2510 Atherosclerotic heart disease of native coronary artery without angina pectoris: Secondary | ICD-10-CM | POA: Diagnosis not present

## 2021-09-10 DIAGNOSIS — R54 Age-related physical debility: Secondary | ICD-10-CM | POA: Diagnosis not present

## 2021-09-10 DIAGNOSIS — I255 Ischemic cardiomyopathy: Secondary | ICD-10-CM | POA: Diagnosis not present

## 2021-09-10 DIAGNOSIS — I110 Hypertensive heart disease with heart failure: Secondary | ICD-10-CM | POA: Diagnosis not present

## 2021-09-10 DIAGNOSIS — D649 Anemia, unspecified: Secondary | ICD-10-CM | POA: Diagnosis not present

## 2021-10-16 DIAGNOSIS — H5789 Other specified disorders of eye and adnexa: Secondary | ICD-10-CM | POA: Diagnosis not present

## 2021-10-16 DIAGNOSIS — M542 Cervicalgia: Secondary | ICD-10-CM | POA: Diagnosis not present

## 2021-10-18 DIAGNOSIS — I110 Hypertensive heart disease with heart failure: Secondary | ICD-10-CM | POA: Diagnosis not present

## 2021-10-18 DIAGNOSIS — I2510 Atherosclerotic heart disease of native coronary artery without angina pectoris: Secondary | ICD-10-CM | POA: Diagnosis not present

## 2021-10-18 DIAGNOSIS — H409 Unspecified glaucoma: Secondary | ICD-10-CM | POA: Diagnosis not present

## 2021-10-18 DIAGNOSIS — I255 Ischemic cardiomyopathy: Secondary | ICD-10-CM | POA: Diagnosis not present

## 2021-10-18 DIAGNOSIS — R54 Age-related physical debility: Secondary | ICD-10-CM | POA: Diagnosis not present

## 2021-10-18 DIAGNOSIS — I5042 Chronic combined systolic (congestive) and diastolic (congestive) heart failure: Secondary | ICD-10-CM | POA: Diagnosis not present

## 2021-10-18 DIAGNOSIS — I252 Old myocardial infarction: Secondary | ICD-10-CM | POA: Diagnosis not present

## 2021-10-18 DIAGNOSIS — D649 Anemia, unspecified: Secondary | ICD-10-CM | POA: Diagnosis not present

## 2021-10-22 DIAGNOSIS — H5789 Other specified disorders of eye and adnexa: Secondary | ICD-10-CM | POA: Diagnosis not present

## 2021-10-22 DIAGNOSIS — M542 Cervicalgia: Secondary | ICD-10-CM | POA: Diagnosis not present

## 2021-10-24 DIAGNOSIS — H5789 Other specified disorders of eye and adnexa: Secondary | ICD-10-CM | POA: Diagnosis not present

## 2021-10-24 DIAGNOSIS — M542 Cervicalgia: Secondary | ICD-10-CM | POA: Diagnosis not present

## 2021-10-29 DIAGNOSIS — H5789 Other specified disorders of eye and adnexa: Secondary | ICD-10-CM | POA: Diagnosis not present

## 2021-10-29 DIAGNOSIS — M542 Cervicalgia: Secondary | ICD-10-CM | POA: Diagnosis not present

## 2021-11-01 DIAGNOSIS — H5789 Other specified disorders of eye and adnexa: Secondary | ICD-10-CM | POA: Diagnosis not present

## 2021-11-01 DIAGNOSIS — M542 Cervicalgia: Secondary | ICD-10-CM | POA: Diagnosis not present

## 2021-11-05 DIAGNOSIS — D649 Anemia, unspecified: Secondary | ICD-10-CM | POA: Diagnosis not present

## 2021-11-05 DIAGNOSIS — I5042 Chronic combined systolic (congestive) and diastolic (congestive) heart failure: Secondary | ICD-10-CM | POA: Diagnosis not present

## 2021-11-05 DIAGNOSIS — I110 Hypertensive heart disease with heart failure: Secondary | ICD-10-CM | POA: Diagnosis not present

## 2021-11-05 DIAGNOSIS — R54 Age-related physical debility: Secondary | ICD-10-CM | POA: Diagnosis not present

## 2021-11-05 DIAGNOSIS — I252 Old myocardial infarction: Secondary | ICD-10-CM | POA: Diagnosis not present

## 2021-11-05 DIAGNOSIS — H409 Unspecified glaucoma: Secondary | ICD-10-CM | POA: Diagnosis not present

## 2021-11-05 DIAGNOSIS — I2510 Atherosclerotic heart disease of native coronary artery without angina pectoris: Secondary | ICD-10-CM | POA: Diagnosis not present

## 2021-11-05 DIAGNOSIS — I255 Ischemic cardiomyopathy: Secondary | ICD-10-CM | POA: Diagnosis not present

## 2021-11-06 DIAGNOSIS — H5789 Other specified disorders of eye and adnexa: Secondary | ICD-10-CM | POA: Diagnosis not present

## 2021-11-06 DIAGNOSIS — M542 Cervicalgia: Secondary | ICD-10-CM | POA: Diagnosis not present

## 2021-11-08 DIAGNOSIS — H5789 Other specified disorders of eye and adnexa: Secondary | ICD-10-CM | POA: Diagnosis not present

## 2021-11-08 DIAGNOSIS — M542 Cervicalgia: Secondary | ICD-10-CM | POA: Diagnosis not present

## 2021-11-12 DIAGNOSIS — M542 Cervicalgia: Secondary | ICD-10-CM | POA: Diagnosis not present

## 2021-11-12 DIAGNOSIS — H5789 Other specified disorders of eye and adnexa: Secondary | ICD-10-CM | POA: Diagnosis not present

## 2021-11-13 DIAGNOSIS — K219 Gastro-esophageal reflux disease without esophagitis: Secondary | ICD-10-CM | POA: Diagnosis not present

## 2021-11-13 DIAGNOSIS — H409 Unspecified glaucoma: Secondary | ICD-10-CM | POA: Diagnosis not present

## 2021-11-13 DIAGNOSIS — I252 Old myocardial infarction: Secondary | ICD-10-CM | POA: Diagnosis not present

## 2021-11-13 DIAGNOSIS — I130 Hypertensive heart and chronic kidney disease with heart failure and stage 1 through stage 4 chronic kidney disease, or unspecified chronic kidney disease: Secondary | ICD-10-CM | POA: Diagnosis not present

## 2021-11-13 DIAGNOSIS — N1831 Chronic kidney disease, stage 3a: Secondary | ICD-10-CM | POA: Diagnosis not present

## 2021-11-13 DIAGNOSIS — R54 Age-related physical debility: Secondary | ICD-10-CM | POA: Diagnosis not present

## 2021-11-13 DIAGNOSIS — Z7982 Long term (current) use of aspirin: Secondary | ICD-10-CM | POA: Diagnosis not present

## 2021-11-13 DIAGNOSIS — D649 Anemia, unspecified: Secondary | ICD-10-CM | POA: Diagnosis not present

## 2021-11-13 DIAGNOSIS — I5042 Chronic combined systolic (congestive) and diastolic (congestive) heart failure: Secondary | ICD-10-CM | POA: Diagnosis not present

## 2021-11-13 DIAGNOSIS — F419 Anxiety disorder, unspecified: Secondary | ICD-10-CM | POA: Diagnosis not present

## 2021-11-15 DIAGNOSIS — H5789 Other specified disorders of eye and adnexa: Secondary | ICD-10-CM | POA: Diagnosis not present

## 2021-11-15 DIAGNOSIS — M542 Cervicalgia: Secondary | ICD-10-CM | POA: Diagnosis not present

## 2021-11-18 ENCOUNTER — Other Ambulatory Visit: Payer: Self-pay

## 2021-11-18 ENCOUNTER — Emergency Department (HOSPITAL_COMMUNITY)
Admission: EM | Admit: 2021-11-18 | Discharge: 2021-11-18 | Disposition: A | Discharge status: Home / Self Care | Payer: Medicare Other | Attending: Emergency Medicine | Admitting: Emergency Medicine

## 2021-11-18 ENCOUNTER — Encounter (HOSPITAL_COMMUNITY): Payer: Self-pay | Admitting: Emergency Medicine

## 2021-11-18 DIAGNOSIS — S51801D Unspecified open wound of right forearm, subsequent encounter: Secondary | ICD-10-CM | POA: Diagnosis not present

## 2021-11-18 DIAGNOSIS — Z7982 Long term (current) use of aspirin: Secondary | ICD-10-CM | POA: Diagnosis not present

## 2021-11-18 DIAGNOSIS — I2510 Atherosclerotic heart disease of native coronary artery without angina pectoris: Secondary | ICD-10-CM | POA: Insufficient documentation

## 2021-11-18 DIAGNOSIS — Z91040 Latex allergy status: Secondary | ICD-10-CM | POA: Insufficient documentation

## 2021-11-18 DIAGNOSIS — Z7902 Long term (current) use of antithrombotics/antiplatelets: Secondary | ICD-10-CM | POA: Insufficient documentation

## 2021-11-18 DIAGNOSIS — X58XXXA Exposure to other specified factors, initial encounter: Secondary | ICD-10-CM | POA: Insufficient documentation

## 2021-11-18 DIAGNOSIS — S40811A Abrasion of right upper arm, initial encounter: Secondary | ICD-10-CM | POA: Insufficient documentation

## 2021-11-18 DIAGNOSIS — S4991XA Unspecified injury of right shoulder and upper arm, initial encounter: Secondary | ICD-10-CM | POA: Diagnosis present

## 2021-11-18 NOTE — Progress Notes (Deleted)
?Cardiology Office Note:   ? ?Date:  11/18/2021  ? ?ID:  Patricia Garcia, DOB 08/12/1932, MRN [id number] ? ?PCP:  Darrol Jump, NP ?  ?[place]  ?Cardiologist:  Freada Bergeron, MD  ?Advanced Practice Provider:  Liliane Shi, PA-C ?Electrophysiologist:  None  ? ? ?Referring MD: Darrol Jump, NP  ? ? ?History of Present Illness:   ? ?Patricia Garcia is a 88 y.o. female with a hx of LBBB, hypertension, diverticulosis, arthritis, depression, right femur fracture s/p THA 05/02/2020, depression, osteopenia, chronic diastolic CHF, CAD s/p recent NSTEMI who presents to clinic for follow-up.  ? ?The patient suffered a mechanical fall resulting in hip fracture requiring surgery on 05/02/20.  She was readmitted to [facility] 05/06/20 with nausea, vomiting, and hypoxia.  She was ultimately found to have a NSTEMI (trops in 3k range) and acute on chronic diastolic heart failure.  She was treated with IV Lasix.  TTE 05/07/2020 showed hypokinesis of the basal inferior and inferolateral wall, EF 50 to 14%, grade 2 diastolic dysfunction, elevated left atrial pressure, mild mitral regurgitation.  She underwent cardiac cath 05/08/2020 showing three-vessel obstructive coronary artery disease as outlined below ultimately received DES to mid RCA and first diagonal.  Hospitalization was also notable for anemia requiring blood transfusion, hypokalemia, gait instability, hypokalemia, hypoalbuminemia, mild AKI, and nocturnal confusion. ?  ?During our visit on 08/24/20, the patient was very distressed as she had been feeling unwell for several weeks. Specifically, she stated that she had been feeling more fatigued, nervous, depressed and listless. She had to lay down after minimal activity. Noted blood in her stool about 4 days. No black or tarry stool. Had been dizzy in with position changes and rolling over in bed. No orthopnea, PND, LE edema. During that visit, we stopped her clonidine, changed  ticagrelor to plavix, started losartan and checked CBC and CXR which were normal. She then stopped her losartan as she was concerned about side-effects of feeling too tired and causing her to have crying episodes.  ?  ?During last visit on 09/10/20, she was feeling better. Was still having periods where she felt overwhelmed and sad. No further blood in her stool. She was looking forward to starting cardiac rehab. ? ?Was doing overall well until presenting to the ED 03/2021 with nausea, vomiting, shortness of breath.  Chest x-ray with small bilateral pleural effusions.  BNP elevated 1701.  Troponin negative x2.  She was admitted for CHF exacerbation.  Echo 12/07/2020 with LVEF 25-30%, mildly reduced RV systolic function.  Right and left heart cath 12/10/2020 with patent prior placement in RCA and first diagonal and otherwise stable CAD, mildly elevated right heart pressures.  She was diuresed from 123 to 118 pounds. ?  ?She was seen and followed by heart and vascular transitions of care clinic 12/21/2020 at which time her Lasix was reduced to 20 mg daily and spironolactone 12.5 mg daily initiated. ?  ?She was seen 12/28/2020 with chief complaint of insomnia and increasing dyspnea on exertion. Was noted to be drinking substantial amounts of fluid.  She declined SGLT2 I.  Her Lasix was increased to 40 mg twice daily for 3 days then transition to 40 mg daily.  She was arranged for IV iron infusion given iron deficiency anemia. ? ?She was seen in the ED 03/13/21 with chest pain.  Her hs-Trops were neg (13>>13).  CXR was unremarkable.  EKG showed old LBBB.  Labs: Hgb 13.2, BNP 398, SCr  0.95, K+4.1, ALP 122, ALT 19.  She was DC home and saw Richardson Dopp in follow-up. She was started on imdur at that time, but later stopped it because she stated she could not tolerate it. Repeat TTE 04/2021 showed improvement of LVEF 40-45%.  ? ?Last saw Richardson Dopp, PA in 05/2021 where she was doing well. Was more active without significant  anginal or HF symptoms.  ? ?Past Medical History:  ?Diagnosis Date  ? Anal or rectal pain   ? Anemia   ? Arthritis   ? CAD (coronary artery disease)   ? a. NSTEMI 04/2020 s/p DES to mRCA and D1.  ? Depression   ? Diverticulosis of colon (without mention of hemorrhage)   ? HFrEF (heart failure with reduced ejection fraction) ([place])   ? Echo 5/22: EF 25-30 // Echocardiogram 10/22: EF 40-45, mild LVH, Gr 1 DD, inf and inf-sept HK, normal RVSF, mild LAE, mild MR  ? Hypertension   ? Hypoalbuminemia   ? Internal hemorrhoids without mention of complication   ? Ischemic heart disease   ? LBBB (left bundle branch block)   ? Osteopenia   ? Renal insufficiency   ? S/P right hip fracture   ? Slow transit constipation   ? ? ?Past Surgical History:  ?Procedure Laterality Date  ? ABDOMINAL HYSTERECTOMY    ? CARDIAC CATHETERIZATION    ? CATARACT EXTRACTION, BILATERAL    ? CORONARY STENT INTERVENTION N/A 05/08/2020  ? Procedure: CORONARY STENT INTERVENTION;  Surgeon: Martinique, Peter M, MD;  Location: [place] [facility];  Service: Cardiovascular;  Laterality: N/A;  ? LEFT HEART CATH AND CORONARY ANGIOGRAPHY N/A 05/08/2020  ? Procedure: LEFT HEART CATH AND CORONARY ANGIOGRAPHY;  Surgeon: Martinique, Peter M, MD;  Location: [place] [facility];  Service: Cardiovascular;  Laterality: N/A;  ? MASS EXCISION  06/10/2011  ? Procedure: EXCISION MASS;  Surgeon: Wynonia Sours, MD;  Location: [place];  Service: Orthopedics;  Laterality: Right;  excision cyst right thumb IP, debridement IP right thumb  ? RIGHT/LEFT HEART CATH AND CORONARY ANGIOGRAPHY N/A 12/10/2020  ? Procedure: RIGHT/LEFT HEART CATH AND CORONARY ANGIOGRAPHY;  Surgeon: Troy Sine, MD;  Location: [place] [facility];  Service: Cardiovascular;  Laterality: N/A;  ? TONSILLECTOMY    ? TOTAL HIP ARTHROPLASTY Right 05/02/2020  ? Procedure: TOTAL HIP ARTHROPLASTY ANTERIOR APPROACH;  Surgeon: Rod Can, MD;  Location: [facility];  Service: Orthopedics;  Laterality: Right;   ? ? ?Current Medications: ?No outpatient medications have been marked as taking for the 11/22/21 encounter (Appointment) with Freada Bergeron, MD.  ?  ? ?Allergies:   Imdur [isosorbide nitrate], Alendronate sodium, Doxycycline, Eucalyptus oil, Linaclotide, Medrol [methylprednisolone], Penicillamine, Penicillins, Prednisone, Risedronate, Statins, Statins support [a-g pro], Statins support [acid blockers support], Sulfa antibiotics, Sulfonamide derivatives, Tramadol, Latex, and Tape  ? ?Social History  ? ?Socioeconomic History  ? Marital status: Widowed  ?  Spouse name: Not on file  ? Number of children: 2  ? Years of education: 80  ? Highest education level: High school graduate  ?Occupational History  ? Occupation: Retired  ?  Employer: RETIRED  ?Tobacco Use  ? Smoking status: Former  ?  Types: Cigarettes  ?  Quit date: 06/05/1984  ?  Years since quitting: 37.4  ? Smokeless tobacco: Never  ?Vaping Use  ? Vaping Use: Never used  ?Substance and Sexual Activity  ? Alcohol use: No  ? Drug use: No  ? Sexual activity: Not on  file  ?Other Topics Concern  ? Not on file  ?Social History Narrative  ? Son mark local   ? Daughter out of town in [place]  ? 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 ?Social Determinants of Health  ? ?Financial Resource Strain: Low Risk   ? Difficulty of Paying Living Expenses: Not hard at all  ?Food Insecurity: No Food Insecurity  ? Worried About Charity fundraiser in the Last Year: Never true  ? Ran Out of Food in the Last Year: Never true  ?Transportation Needs: No Transportation Needs  ? Lack of Transportation (Medical): No  ? Lack of Transportation (Non-Medical): No  ?Physical Activity: Not on file  ?Stress: No Stress Concern Present  ? Feeling of Stress : Only a little  ?Social Connections: Not on file  ?  ? ?Family History: ?The patient's family history includes Cancer in her father; Colon polyps in her mother; Heart disease in her father. ? ?ROS:   ?Please see the history of  present illness.    ?Review of Systems  ?Constitutional:  Negative for chills, fever and malaise/fatigue.  ?HENT:  Negative for hearing loss.   ?Eyes:  Negative for blurred vision and redness.  ?Respiratory:

## 2021-11-18 NOTE — ED Triage Notes (Signed)
Pt reports being on Plavix and scratched her arm yesterday, reports intermittent bleeding since, bleeding controlled at this time  ?

## 2021-11-18 NOTE — ED Provider Notes (Signed)
? ?[place]  ?Provider Note ? ?CSN: [id number] ?Arrival date & time: 11/18/21 0321 ? ?History ?Chief Complaint  ?Patient presents with  ? Abrasion  ? ? ?Patricia Garcia is a 88 y.o. female on plavix for CAD, reports she scraped her R upper arm yesterday and it has continued to bleed intermittently since then. Stopped while waiting to be seen in tonight.  ? ? ?Home Medications ?Prior to Admission medications   ?Medication Sig Start Date End Date Taking? Authorizing Provider  ?albuterol (VENTOLIN HFA) 108 (90 Base) MCG/ACT inhaler Inhale 1-2 puffs into the lungs every 6 (six) hours as needed for wheezing or shortness of breath. 12/01/20   Wieters, Hallie C, PA-C  ?aspirin 81 MG EC tablet Take 81 mg by mouth daily. Swallow whole.    [provider]  ?bisoprolol (ZEBETA) 5 MG tablet Take 0.5 tablets (2.5 mg total) by mouth daily. 01/17/21   Loel Dubonnet, NP  ?Calcium Carbonate-Vit D-Min (CALCIUM 1200 PO) Take 1,200 mg by mouth daily.    [provider]  ?Cholecalciferol (VITAMIN D-3 PO) Take 1 capsule by mouth daily after supper.    [provider]  ?clopidogrel (PLAVIX) 75 MG tablet Take 1 tablet (75 mg total) by mouth daily. 08/19/21   Richardson Dopp T, PA-C  ?docusate sodium (COLACE) 100 MG capsule Take 100 mg by mouth in the morning.    [provider]  ?ferrous sulfate 325 (65 FE) MG tablet Take 325 mg by mouth every Monday, Wednesday, and Friday.    [provider]  ?furosemide (LASIX) 20 MG tablet Take 1 tablet (20 mg total) by mouth daily. 09/03/21   Freada Bergeron, MD  ?latanoprost (XALATAN) 0.005 % ophthalmic solution Place 1 drop into both eyes at bedtime.    [provider]  ?Multiple Vitamins-Minerals (MULTIVITAMIN ADULTS 50+) TABS Take 1 tablet by mouth daily with breakfast.    [provider]  ?nitroGLYCERIN (NITROSTAT) 0.4 MG SL tablet Place 1 tablet (0.4 mg total) under the tongue every 5 (five)  minutes as needed for chest pain. 03/20/21 06/18/21  Richardson Dopp T, PA-C  ?pantoprazole (PROTONIX) 40 MG tablet TAKE ONE TABLET BY MOUTH DAILY 08/01/21   Richardson Dopp T, PA-C  ?sodium chloride (MURO 128) 2 % ophthalmic solution Place 1 drop into both eyes 2 (two) times daily.    [provider]  ?spironolactone (ALDACTONE) 25 MG tablet Take 0.5 tablets (12.5 mg total) by mouth daily. 01/17/21   Loel Dubonnet, NP  ? ? ? ?Allergies    ?Imdur [isosorbide nitrate], Alendronate sodium, Doxycycline, Eucalyptus oil, Linaclotide, Medrol [methylprednisolone], Penicillamine, Penicillins, Prednisone, Risedronate, Statins, Statins support [a-g pro], Statins support [acid blockers support], Sulfa antibiotics, Sulfonamide derivatives, Tramadol, Latex, and Tape ? ? ?Review of Systems   ?Review of Systems ?Please see HPI for pertinent positives and negatives ? ?Physical Exam ?BP (!) 180/88 (BP Location: Left Arm)   Pulse 81   Temp 97.8 ?F (36.6 ?C) (Oral)   Resp 17   Ht '5\' 2"'$  (1.575 m)   Wt 52.2 kg   SpO2 98%   BMI 21.03 kg/m?  ? ?Physical Exam ?Vitals and nursing note reviewed.  ?HENT:  ?   Head: Normocephalic.  ?   Nose: Nose normal.  ?Eyes:  ?   Extraocular Movements: Extraocular movements intact.  ?Pulmonary:  ?   Effort: Pulmonary effort is normal.  ?Musculoskeletal:     ?   General: Normal range of motion.  ?  Cervical back: Neck supple.  ?Skin: ?   Findings: No rash (on exposed skin).  ?   Comments: Small superficial wound on R upper extremity, no active bleeding  ?Neurological:  ?   Mental Status: She is alert and oriented to person, place, and time.  ?Psychiatric:     ?   Mood and Affect: Mood normal.  ? ? ?ED Results / Procedures / Treatments   ?EKG ?None ? ?Procedures ?Procedures ? ?Medications Ordered in the ED ?Medications - No data to display ? ?Initial Impression and Plan ? Wound care instructions, including how to hold pressure, discussed with patient and son at bedside. No current bleeding.  Recommend PCP follow up as scheduled, return if bleeding is not controlled at home.  ? ?ED Course  ? ?  ? ? ?MDM Rules/Calculators/A&P ?Medical Decision Making ?Problems Addressed: ?Abrasion of right upper arm, initial encounter: acute illness or injury ? ? ? ?Final Clinical Impression(s) / ED Diagnoses ?Final diagnoses:  ?Abrasion of right upper arm, initial encounter  ? ? ?Rx / DC Orders ?ED Discharge Orders   ? ? None  ? ?  ? ?  ?Truddie Hidden, MD ?11/18/21 0602 ? ?

## 2021-11-19 DIAGNOSIS — M542 Cervicalgia: Secondary | ICD-10-CM | POA: Diagnosis not present

## 2021-11-19 DIAGNOSIS — H5789 Other specified disorders of eye and adnexa: Secondary | ICD-10-CM | POA: Diagnosis not present

## 2021-11-21 DIAGNOSIS — H5789 Other specified disorders of eye and adnexa: Secondary | ICD-10-CM | POA: Diagnosis not present

## 2021-11-21 DIAGNOSIS — M542 Cervicalgia: Secondary | ICD-10-CM | POA: Diagnosis not present

## 2021-11-22 ENCOUNTER — Encounter: Payer: Self-pay | Admitting: Cardiology

## 2021-11-22 ENCOUNTER — Ambulatory Visit (INDEPENDENT_AMBULATORY_CARE_PROVIDER_SITE_OTHER): Payer: Medicare Other | Admitting: Cardiology

## 2021-11-22 VITALS — BP 124/72 | HR 69 | Ht 62.0 in | Wt 119.0 lb

## 2021-11-22 DIAGNOSIS — Z79899 Other long term (current) drug therapy: Secondary | ICD-10-CM | POA: Diagnosis not present

## 2021-11-22 DIAGNOSIS — I5042 Chronic combined systolic (congestive) and diastolic (congestive) heart failure: Secondary | ICD-10-CM | POA: Diagnosis not present

## 2021-11-22 DIAGNOSIS — I5020 Unspecified systolic (congestive) heart failure: Secondary | ICD-10-CM

## 2021-11-22 DIAGNOSIS — I340 Nonrheumatic mitral (valve) insufficiency: Secondary | ICD-10-CM | POA: Diagnosis not present

## 2021-11-22 DIAGNOSIS — E785 Hyperlipidemia, unspecified: Secondary | ICD-10-CM | POA: Diagnosis not present

## 2021-11-22 DIAGNOSIS — I2510 Atherosclerotic heart disease of native coronary artery without angina pectoris: Secondary | ICD-10-CM

## 2021-11-22 MED ORDER — SPIRONOLACTONE 25 MG PO TABS: 12.5000 mg | ORAL_TABLET | Freq: Every day | ORAL | 3 refills | Status: DC

## 2021-11-22 MED ORDER — FUROSEMIDE 20 MG PO TABS: 20.0000 mg | ORAL_TABLET | Freq: Every day | ORAL | 3 refills | Status: DC

## 2021-11-22 MED ORDER — CLOPIDOGREL BISULFATE 75 MG PO TABS: 75.0000 mg | ORAL_TABLET | Freq: Every day | ORAL | 3 refills | Status: DC

## 2021-11-22 MED ORDER — BISOPROLOL FUMARATE 5 MG PO TABS: 2.5000 mg | ORAL_TABLET | Freq: Every day | ORAL | 3 refills | Status: DC

## 2021-11-22 MED ORDER — PANTOPRAZOLE SODIUM 40 MG PO TBEC: 40.0000 mg | DELAYED_RELEASE_TABLET | Freq: Every day | ORAL | 3 refills | Status: DC

## 2021-11-22 NOTE — Progress Notes (Signed)
?Cardiology Office Note:   ? ?Date:  11/22/2021  ? ?ID:  Patricia Garcia, DOB 06/23/1933, MRN [id number] ? ?PCP:  Darrol Jump, NP ?  ?[place]  ?Cardiologist:  Freada Bergeron, MD  ?Advanced Practice Provider:  Liliane Shi, PA-C ?Electrophysiologist:  None  ? ? ?Referring MD: Darrol Jump, NP  ? ? ?History of Present Illness:   ? ?Patricia Garcia is a 88 y.o. female with a hx of LBBB, hypertension, diverticulosis, arthritis, depression, right femur fracture s/p THA 05/02/2020, depression, osteopenia, chronic diastolic CHF, CAD s/p recent NSTEMI who presents to clinic for follow-up.  ? ?The patient suffered a mechanical fall resulting in hip fracture requiring surgery on 05/02/20.  She was readmitted to [facility] 05/06/20 with nausea, vomiting, and hypoxia.  She was ultimately found to have a NSTEMI (trops in 3k range) and acute on chronic diastolic heart failure.  She was treated with IV Lasix.  TTE 05/07/2020 showed hypokinesis of the basal inferior and inferolateral wall, EF 50 to 82%, grade 2 diastolic dysfunction, elevated left atrial pressure, mild mitral regurgitation.  She underwent cardiac cath 05/08/2020 showing three-vessel obstructive coronary artery disease as outlined below ultimately received DES to mid RCA and first diagonal.  Hospitalization was also notable for anemia requiring blood transfusion, hypokalemia, gait instability, hypokalemia, hypoalbuminemia, mild AKI, and nocturnal confusion. ?  ?During our visit on 08/24/20, the patient was very distressed as she had been feeling unwell for several weeks. Specifically, she stated that she had been feeling more fatigued, nervous, depressed and listless. She had to lay down after minimal activity. Noted blood in her stool about 4 days. No black or tarry stool. Had been dizzy in with position changes and rolling over in bed. No orthopnea, PND, LE edema. During that visit, we stopped her clonidine, changed  ticagrelor to plavix, started losartan and checked CBC and CXR which were normal. She then stopped her losartan as she was concerned about side-effects of feeling too tired and causing her to have crying episodes.  ?  ?During last visit on 09/10/20, she was feeling better. Was still having periods where she felt overwhelmed and sad. No further blood in her stool. She was looking forward to starting cardiac rehab. ? ?Was doing overall well until presenting to the ED 03/2021 with nausea, vomiting, shortness of breath. Chest x-ray with small bilateral pleural effusions.  BNP elevated 1701.  Troponin negative x2.  She was admitted for CHF exacerbation.  Echo 12/07/2020 with LVEF 25-30%, mildly reduced RV systolic function.  Right and left heart cath 12/10/2020 with patent prior placement in RCA and first diagonal and otherwise stable CAD, mildly elevated right heart pressures.  She was diuresed from 123 to 118 pounds. ?  ?She was seen and followed by heart and vascular transitions of care clinic 12/21/2020 at which time her Lasix was reduced to 20 mg daily and spironolactone 12.5 mg daily initiated. ?  ?She was seen 12/28/2020 with chief complaint of insomnia and increasing dyspnea on exertion. Was noted to be drinking substantial amounts of fluid.  She declined SGLT2 I.  Her Lasix was increased to 40 mg twice daily for 3 days then transition to 40 mg daily.  She was arranged for IV iron infusion given iron deficiency anemia. ? ?She was seen in the ED 03/13/21 with chest pain.  Her hs-Trops were neg (13>>13).  CXR was unremarkable.  EKG showed old LBBB.  Labs: Hgb 13.2, BNP 398, SCr 0.95,  K+4.1, ALP 122, ALT 19.  She was DC home and saw Richardson Dopp in follow-up. She was started on imdur at that time, but later stopped it because she stated she could not tolerate it. Repeat TTE 04/2021 showed improvement of LVEF 40-45%.  ? ?Last saw Richardson Dopp, PA in 05/2021 where she was doing well. Was more active without significant  anginal or HF symptoms.  ? ?Today, the patient states that she is feeling great aside from some dizziness. She believes this may be due to medication or her PT for her neck.  ? ?She had two small sores on her right shoulder/upper arm. Last Sunday, one of them became scratched and started bleeding at 1 PM. At 2:30 the next morning her wound was still bleeding. She presented to the ED and waited for 3 hours. Her bleeding then resolved prior to being examined. No further intervention performed at that time and no recurrence of bleeding. ? ?She monitors her weight every morning, which was 116.2 lbs at home this morning. For a time, she admits to not eating enough and was losing weight. Lately she has been trying to eat healthy foods and avoid salt. She makes sure not to add any salt. Her weight has been steady.  ? ?For exercise she walks frequently, with no anginal symptoms. She walks up and down 12 stairs at home, and she no longer has to stop and rest after taking a shower (improved from last year). ? ?She denies any palpitations, chest pain, shortness of breath, or peripheral edema. No headaches, syncope, orthopnea, or PND. ? ? ?Past Medical History:  ?Diagnosis Date  ? Anal or rectal pain   ? Anemia   ? Arthritis   ? CAD (coronary artery disease)   ? a. NSTEMI 04/2020 s/p DES to mRCA and D1.  ? Depression   ? Diverticulosis of colon (without mention of hemorrhage)   ? HFrEF (heart failure with reduced ejection fraction) ([place])   ? Echo 5/22: EF 25-30 // Echocardiogram 10/22: EF 40-45, mild LVH, Gr 1 DD, inf and inf-sept HK, normal RVSF, mild LAE, mild MR  ? Hypertension   ? Hypoalbuminemia   ? Internal hemorrhoids without mention of complication   ? Ischemic heart disease   ? LBBB (left bundle branch block)   ? Osteopenia   ? Renal insufficiency   ? S/P right hip fracture   ? Slow transit constipation   ? ? ?Past Surgical History:  ?Procedure Laterality Date  ? ABDOMINAL HYSTERECTOMY    ? CARDIAC CATHETERIZATION    ?  CATARACT EXTRACTION, BILATERAL    ? CORONARY STENT INTERVENTION N/A 05/08/2020  ? Procedure: CORONARY STENT INTERVENTION;  Surgeon: Martinique, Peter M, MD;  Location: [place] [facility];  Service: Cardiovascular;  Laterality: N/A;  ? LEFT HEART CATH AND CORONARY ANGIOGRAPHY N/A 05/08/2020  ? Procedure: LEFT HEART CATH AND CORONARY ANGIOGRAPHY;  Surgeon: Martinique, Peter M, MD;  Location: [facility];  Service: Cardiovascular;  Laterality: N/A;  ? MASS EXCISION  06/10/2011  ? Procedure: EXCISION MASS;  Surgeon: Wynonia Sours, MD;  Location: [facility];  Service: Orthopedics;  Laterality: Right;  excision cyst right thumb IP, debridement IP right thumb  ? RIGHT/LEFT HEART CATH AND CORONARY ANGIOGRAPHY N/A 12/10/2020  ? Procedure: RIGHT/LEFT HEART CATH AND CORONARY ANGIOGRAPHY;  Surgeon: Troy Sine, MD;  Location: [place] [facility];  Service: Cardiovascular;  Laterality: N/A;  ? TONSILLECTOMY    ? TOTAL HIP ARTHROPLASTY  Right 05/02/2020  ? Procedure: TOTAL HIP ARTHROPLASTY ANTERIOR APPROACH;  Surgeon: Rod Can, MD;  Location: [facility];  Service: Orthopedics;  Laterality: Right;  ? ? ?Current Medications: ?Current Meds  ?Medication Sig  ? Calcium Carbonate-Vit D-Min (CALCIUM 1200 PO) Take 1,200 mg by mouth daily.  ? Cholecalciferol (VITAMIN D-3 PO) Take 1 capsule by mouth daily after supper.  ? docusate sodium (COLACE) 100 MG capsule Take 100 mg by mouth in the morning.  ? latanoprost (XALATAN) 0.005 % ophthalmic solution Place 1 drop into both eyes at bedtime.  ? Multiple Vitamins-Minerals (MULTIVITAMIN ADULTS 50+) TABS Take 1 tablet by mouth daily with breakfast.  ? [DISCONTINUED] aspirin 81 MG EC tablet Take 81 mg by mouth daily. Swallow whole.  ? [DISCONTINUED] bisoprolol (ZEBETA) 5 MG tablet Take 0.5 tablets (2.5 mg total) by mouth daily.  ? [DISCONTINUED] clopidogrel (PLAVIX) 75 MG tablet Take 1 tablet (75 mg total) by mouth daily.  ? [DISCONTINUED] furosemide (LASIX) 20 MG tablet Take 1  tablet (20 mg total) by mouth daily.  ? [DISCONTINUED] pantoprazole (PROTONIX) 40 MG tablet TAKE ONE TABLET BY MOUTH DAILY  ? [DISCONTINUED] spironolactone (ALDACTONE) 25 MG tablet Take 0.5 tablets (12.5 mg t

## 2021-11-22 NOTE — Patient Instructions (Signed)
Medication Instructions:  ? ?STOP TAKING ASPIRIN NOW ? ?*If you need a refill on your cardiac medications before your next appointment, please call your pharmacy* ? ? ?Lab Work: ? ?NEXT WEEK HERE IN THE OFFICE--CHECK LIPIDS--PLEASE COME FASTING TO THIS LAB APPOINTMENT ? ?If you have labs (blood work) drawn today and your tests are completely normal, you will receive your results only by: ?MyChart Message (if you have MyChart) OR ?A paper copy in the mail ?If you have any lab test that is abnormal or we need to change your treatment, we will call you to review the results. ? ? ?Testing/Procedures: ? ?Your physician has requested that you have an echocardiogram. Echocardiography is a painless test that uses sound waves to create images of your heart. It provides your doctor with information about the size and shape of your heart and how well your heart?s chambers and valves are working. This procedure takes approximately one hour. There are no restrictions for this procedure.  SCHEDULE ECHO TO BE DONE IN October 2023 PER DR. Johney Frame ? ? ?Follow-Up: ?At [facility], you and your health needs are our priority.  As part of our continuing mission to provide you with exceptional heart care, we have created designated Provider Care Teams.  These Care Teams include your primary Cardiologist (physician) and Advanced Practice Providers (APPs -  Physician Assistants and Nurse Practitioners) who all work together to provide you with the care you need, when you need it. ? ?We recommend signing up for the patient portal called "MyChart".  Sign up information is provided on this After Visit Summary.  MyChart is used to connect with patients for Virtual Visits (Telemedicine).  Patients are able to view lab/test results, encounter notes, upcoming appointments, etc.  Non-urgent messages can be sent to your provider as well.   ?To learn more about what you can do with MyChart, go to [email or web address].   ? ?Your next  appointment:   ?6 month(s) ? ?The format for your next appointment:   ?In Person ? ?Provider:   ?Freada Bergeron, MD { ? ? ?Important Information About Sugar ? ? ? ? ? ? ?

## 2021-11-25 DIAGNOSIS — M542 Cervicalgia: Secondary | ICD-10-CM | POA: Diagnosis not present

## 2021-11-25 DIAGNOSIS — H5789 Other specified disorders of eye and adnexa: Secondary | ICD-10-CM | POA: Diagnosis not present

## 2021-11-26 ENCOUNTER — Other Ambulatory Visit: Payer: Medicare Other | Admitting: *Deleted

## 2021-11-26 DIAGNOSIS — E785 Hyperlipidemia, unspecified: Secondary | ICD-10-CM | POA: Diagnosis not present

## 2021-11-26 DIAGNOSIS — I2510 Atherosclerotic heart disease of native coronary artery without angina pectoris: Secondary | ICD-10-CM | POA: Diagnosis not present

## 2021-11-26 DIAGNOSIS — Z79899 Other long term (current) drug therapy: Secondary | ICD-10-CM | POA: Diagnosis not present

## 2021-11-26 DIAGNOSIS — I5042 Chronic combined systolic (congestive) and diastolic (congestive) heart failure: Secondary | ICD-10-CM

## 2021-11-26 LAB — LIPID PANEL
Chol/HDL Ratio: 6 ratio — ABNORMAL HIGH (ref 0.0–4.4)
Cholesterol, Total: 251 mg/dL — ABNORMAL HIGH (ref 100–199)
HDL: 42 mg/dL (ref >39)
LDL Chol Calc (NIH): 171 mg/dL — ABNORMAL HIGH (ref 0–99)
Triglycerides: 203 mg/dL — ABNORMAL HIGH (ref 0–149)
VLDL Cholesterol Cal: 38 mg/dL (ref 5–40)

## 2021-11-27 ENCOUNTER — Telehealth: Payer: Self-pay | Admitting: *Deleted

## 2021-11-27 DIAGNOSIS — M542 Cervicalgia: Secondary | ICD-10-CM | POA: Diagnosis not present

## 2021-11-27 DIAGNOSIS — H5789 Other specified disorders of eye and adnexa: Secondary | ICD-10-CM | POA: Diagnosis not present

## 2021-11-27 MED ORDER — BEMPEDOIC ACID 180 MG PO TABS: 180.0000 mg | ORAL_TABLET | Freq: Every day | ORAL | 6 refills | Status: DC

## 2021-11-27 NOTE — Telephone Encounter (Signed)
The patient has been notified of the result and verbalized understanding.  All questions (if any) were answered. ? ?Pt has agreed to start taking bempedoic acid to help lower her LDL.  ?Pt will start bempedoic acid 180 mg po daily.  ?Confirmed the pharmacy of choice with the pt.  ?Pt verbalized understanding and agrees with this plan. ? ?

## 2021-11-27 NOTE — Telephone Encounter (Signed)
Patient does not have prescription insurance. There is no patient assistance for Nexletol. Nexletol is not affordable. ?

## 2021-11-27 NOTE — Telephone Encounter (Signed)
Dr. Johney Frame please advise on alternative regimen and/or just instruct the pt to continue her current med regimen and lifestyle modifications? ? ?Pt has no prescription insurance therefore bempedoic acid is not covered per our Pharmacist.  ? ?Pt mentioned earlier she is refusing to start any kind of statin.  ? ?Please advise and I will follow-up with the pt accordingly thereafter.  ?

## 2021-11-27 NOTE — Telephone Encounter (Signed)
-----   Message from Freada Bergeron, MD sent at 11/27/2021  7:00 AM EDT ----- ?Her cholesterol remains very elevated. Does she want to just continue as is or would she like to try a different medication to try to lower it? I can see if she can get bempedoic acid if she is interested. I know she has a very hard time with new medications.  ?

## 2021-11-27 NOTE — Telephone Encounter (Signed)
[facility] is stating that pt's medication Bempedoic acid is too expensive and wanted to know if Dr Johney Frame want to send in a alternative or does pt need an prior auth? Please address ?

## 2021-11-27 NOTE — Telephone Encounter (Signed)
This was anticipated and staff message sent earlier to our Prior Auth Nurse and PharmD team, about this possibly occurring.  ? ?Will route this message to our Pharmacist and Prior Auth Nurse for further review and assistance with PA on bempedoic acid.  ?

## 2021-11-28 NOTE — Addendum Note (Signed)
Addended by: Nuala Alpha on: 11/28/2021 08:36 AM ? ? Modules accepted: Orders ? ?

## 2021-11-28 NOTE — Telephone Encounter (Signed)
Freada Bergeron, MD  Nuala Alpha, LPN ?Caller: Unspecified (Yesterday,  1:56 PM) ?We will just have to continue with lifestyle. She did not tolerate anything else.  ? ? ?Pt aware that bempedoic acid is not covered by her plan and Dr. Johney Frame is ok with her just continuing healthy lifestyle and her current regimen, being she did not tolerate anything else.  ?Pt verbalized understanding and agrees with this plan. ?Discontinued bempedoic acid from her med list with a note that this med is not covered by the pts insurance plan. ?

## 2021-12-12 DIAGNOSIS — I255 Ischemic cardiomyopathy: Secondary | ICD-10-CM | POA: Diagnosis not present

## 2021-12-12 DIAGNOSIS — I110 Hypertensive heart disease with heart failure: Secondary | ICD-10-CM | POA: Diagnosis not present

## 2021-12-12 DIAGNOSIS — I5042 Chronic combined systolic (congestive) and diastolic (congestive) heart failure: Secondary | ICD-10-CM | POA: Diagnosis not present

## 2021-12-12 DIAGNOSIS — I2510 Atherosclerotic heart disease of native coronary artery without angina pectoris: Secondary | ICD-10-CM | POA: Diagnosis not present

## 2021-12-12 DIAGNOSIS — I252 Old myocardial infarction: Secondary | ICD-10-CM | POA: Diagnosis not present

## 2021-12-12 DIAGNOSIS — D649 Anemia, unspecified: Secondary | ICD-10-CM | POA: Diagnosis not present

## 2021-12-12 DIAGNOSIS — R54 Age-related physical debility: Secondary | ICD-10-CM | POA: Diagnosis not present

## 2021-12-23 DIAGNOSIS — R42 Dizziness and giddiness: Secondary | ICD-10-CM | POA: Diagnosis not present

## 2021-12-23 DIAGNOSIS — Z974 Presence of external hearing-aid: Secondary | ICD-10-CM | POA: Diagnosis not present

## 2021-12-23 DIAGNOSIS — H6123 Impacted cerumen, bilateral: Secondary | ICD-10-CM | POA: Diagnosis not present

## 2021-12-23 DIAGNOSIS — H93293 Other abnormal auditory perceptions, bilateral: Secondary | ICD-10-CM | POA: Diagnosis not present

## 2022-01-13 DIAGNOSIS — R54 Age-related physical debility: Secondary | ICD-10-CM | POA: Diagnosis not present

## 2022-01-13 DIAGNOSIS — H409 Unspecified glaucoma: Secondary | ICD-10-CM | POA: Diagnosis not present

## 2022-01-13 DIAGNOSIS — I252 Old myocardial infarction: Secondary | ICD-10-CM | POA: Diagnosis not present

## 2022-01-13 DIAGNOSIS — I110 Hypertensive heart disease with heart failure: Secondary | ICD-10-CM | POA: Diagnosis not present

## 2022-01-13 DIAGNOSIS — I5042 Chronic combined systolic (congestive) and diastolic (congestive) heart failure: Secondary | ICD-10-CM | POA: Diagnosis not present

## 2022-01-13 DIAGNOSIS — I255 Ischemic cardiomyopathy: Secondary | ICD-10-CM | POA: Diagnosis not present

## 2022-01-13 DIAGNOSIS — I2510 Atherosclerotic heart disease of native coronary artery without angina pectoris: Secondary | ICD-10-CM | POA: Diagnosis not present

## 2022-01-13 DIAGNOSIS — D649 Anemia, unspecified: Secondary | ICD-10-CM | POA: Diagnosis not present

## 2022-01-17 DIAGNOSIS — H401131 Primary open-angle glaucoma, bilateral, mild stage: Secondary | ICD-10-CM | POA: Diagnosis not present

## 2022-01-17 DIAGNOSIS — Z961 Presence of intraocular lens: Secondary | ICD-10-CM | POA: Diagnosis not present

## 2022-01-17 DIAGNOSIS — H04123 Dry eye syndrome of bilateral lacrimal glands: Secondary | ICD-10-CM | POA: Diagnosis not present

## 2022-01-17 DIAGNOSIS — H43811 Vitreous degeneration, right eye: Secondary | ICD-10-CM | POA: Diagnosis not present

## 2022-01-17 DIAGNOSIS — M272 Inflammatory conditions of jaws: Secondary | ICD-10-CM | POA: Diagnosis not present

## 2022-02-03 DIAGNOSIS — N393 Stress incontinence (female) (male): Secondary | ICD-10-CM | POA: Diagnosis not present

## 2022-02-03 DIAGNOSIS — N1831 Chronic kidney disease, stage 3a: Secondary | ICD-10-CM | POA: Diagnosis not present

## 2022-02-03 DIAGNOSIS — K219 Gastro-esophageal reflux disease without esophagitis: Secondary | ICD-10-CM | POA: Diagnosis not present

## 2022-02-03 DIAGNOSIS — H409 Unspecified glaucoma: Secondary | ICD-10-CM | POA: Diagnosis not present

## 2022-02-03 DIAGNOSIS — D649 Anemia, unspecified: Secondary | ICD-10-CM | POA: Diagnosis not present

## 2022-02-03 DIAGNOSIS — Z6820 Body mass index (BMI) 20.0-20.9, adult: Secondary | ICD-10-CM | POA: Diagnosis not present

## 2022-02-03 DIAGNOSIS — M26623 Arthralgia of bilateral temporomandibular joint: Secondary | ICD-10-CM | POA: Diagnosis not present

## 2022-02-03 DIAGNOSIS — I252 Old myocardial infarction: Secondary | ICD-10-CM | POA: Diagnosis not present

## 2022-02-03 DIAGNOSIS — G6181 Chronic inflammatory demyelinating polyneuritis: Secondary | ICD-10-CM | POA: Diagnosis not present

## 2022-02-03 DIAGNOSIS — I5042 Chronic combined systolic (congestive) and diastolic (congestive) heart failure: Secondary | ICD-10-CM | POA: Diagnosis not present

## 2022-02-03 DIAGNOSIS — R54 Age-related physical debility: Secondary | ICD-10-CM | POA: Diagnosis not present

## 2022-02-03 DIAGNOSIS — I130 Hypertensive heart and chronic kidney disease with heart failure and stage 1 through stage 4 chronic kidney disease, or unspecified chronic kidney disease: Secondary | ICD-10-CM | POA: Diagnosis not present

## 2022-02-06 DIAGNOSIS — R54 Age-related physical debility: Secondary | ICD-10-CM | POA: Diagnosis not present

## 2022-02-06 DIAGNOSIS — H409 Unspecified glaucoma: Secondary | ICD-10-CM | POA: Diagnosis not present

## 2022-02-06 DIAGNOSIS — I2510 Atherosclerotic heart disease of native coronary artery without angina pectoris: Secondary | ICD-10-CM | POA: Diagnosis not present

## 2022-02-06 DIAGNOSIS — I5042 Chronic combined systolic (congestive) and diastolic (congestive) heart failure: Secondary | ICD-10-CM | POA: Diagnosis not present

## 2022-02-06 DIAGNOSIS — I255 Ischemic cardiomyopathy: Secondary | ICD-10-CM | POA: Diagnosis not present

## 2022-02-06 DIAGNOSIS — I252 Old myocardial infarction: Secondary | ICD-10-CM | POA: Diagnosis not present

## 2022-02-06 DIAGNOSIS — D649 Anemia, unspecified: Secondary | ICD-10-CM | POA: Diagnosis not present

## 2022-02-06 DIAGNOSIS — I110 Hypertensive heart disease with heart failure: Secondary | ICD-10-CM | POA: Diagnosis not present

## 2022-02-12 DIAGNOSIS — H04123 Dry eye syndrome of bilateral lacrimal glands: Secondary | ICD-10-CM | POA: Diagnosis not present

## 2022-02-12 DIAGNOSIS — F5102 Adjustment insomnia: Secondary | ICD-10-CM | POA: Diagnosis not present

## 2022-02-12 DIAGNOSIS — M542 Cervicalgia: Secondary | ICD-10-CM | POA: Diagnosis not present

## 2022-02-12 DIAGNOSIS — I5042 Chronic combined systolic (congestive) and diastolic (congestive) heart failure: Secondary | ICD-10-CM | POA: Diagnosis not present

## 2022-02-12 DIAGNOSIS — K1379 Other lesions of oral mucosa: Secondary | ICD-10-CM | POA: Diagnosis not present

## 2022-03-14 DIAGNOSIS — R42 Dizziness and giddiness: Secondary | ICD-10-CM | POA: Diagnosis not present

## 2022-03-14 DIAGNOSIS — F5102 Adjustment insomnia: Secondary | ICD-10-CM | POA: Diagnosis not present

## 2022-03-14 DIAGNOSIS — J0101 Acute recurrent maxillary sinusitis: Secondary | ICD-10-CM | POA: Diagnosis not present

## 2022-03-14 DIAGNOSIS — M542 Cervicalgia: Secondary | ICD-10-CM | POA: Diagnosis not present

## 2022-03-14 DIAGNOSIS — K1379 Other lesions of oral mucosa: Secondary | ICD-10-CM | POA: Diagnosis not present

## 2022-03-20 DIAGNOSIS — I110 Hypertensive heart disease with heart failure: Secondary | ICD-10-CM | POA: Diagnosis not present

## 2022-03-20 DIAGNOSIS — I5042 Chronic combined systolic (congestive) and diastolic (congestive) heart failure: Secondary | ICD-10-CM | POA: Diagnosis not present

## 2022-03-20 DIAGNOSIS — R54 Age-related physical debility: Secondary | ICD-10-CM | POA: Diagnosis not present

## 2022-03-20 DIAGNOSIS — I252 Old myocardial infarction: Secondary | ICD-10-CM | POA: Diagnosis not present

## 2022-03-20 DIAGNOSIS — I2510 Atherosclerotic heart disease of native coronary artery without angina pectoris: Secondary | ICD-10-CM | POA: Diagnosis not present

## 2022-03-20 DIAGNOSIS — I255 Ischemic cardiomyopathy: Secondary | ICD-10-CM | POA: Diagnosis not present

## 2022-03-20 DIAGNOSIS — D649 Anemia, unspecified: Secondary | ICD-10-CM | POA: Diagnosis not present

## 2022-03-26 DIAGNOSIS — Z7189 Other specified counseling: Secondary | ICD-10-CM | POA: Diagnosis not present

## 2022-03-26 DIAGNOSIS — Z8709 Personal history of other diseases of the respiratory system: Secondary | ICD-10-CM | POA: Diagnosis not present

## 2022-03-26 DIAGNOSIS — J302 Other seasonal allergic rhinitis: Secondary | ICD-10-CM | POA: Diagnosis not present

## 2022-04-17 DIAGNOSIS — I110 Hypertensive heart disease with heart failure: Secondary | ICD-10-CM | POA: Diagnosis not present

## 2022-04-17 DIAGNOSIS — I2510 Atherosclerotic heart disease of native coronary artery without angina pectoris: Secondary | ICD-10-CM | POA: Diagnosis not present

## 2022-04-17 DIAGNOSIS — R54 Age-related physical debility: Secondary | ICD-10-CM | POA: Diagnosis not present

## 2022-04-17 DIAGNOSIS — D649 Anemia, unspecified: Secondary | ICD-10-CM | POA: Diagnosis not present

## 2022-04-17 DIAGNOSIS — I252 Old myocardial infarction: Secondary | ICD-10-CM | POA: Diagnosis not present

## 2022-04-17 DIAGNOSIS — I255 Ischemic cardiomyopathy: Secondary | ICD-10-CM | POA: Diagnosis not present

## 2022-04-17 DIAGNOSIS — I5042 Chronic combined systolic (congestive) and diastolic (congestive) heart failure: Secondary | ICD-10-CM | POA: Diagnosis not present

## 2022-04-21 DIAGNOSIS — Z23 Encounter for immunization: Secondary | ICD-10-CM | POA: Diagnosis not present

## 2022-04-22 DIAGNOSIS — F419 Anxiety disorder, unspecified: Secondary | ICD-10-CM | POA: Diagnosis not present

## 2022-05-01 DIAGNOSIS — M26623 Arthralgia of bilateral temporomandibular joint: Secondary | ICD-10-CM | POA: Diagnosis not present

## 2022-05-01 DIAGNOSIS — N393 Stress incontinence (female) (male): Secondary | ICD-10-CM | POA: Diagnosis not present

## 2022-05-01 DIAGNOSIS — I110 Hypertensive heart disease with heart failure: Secondary | ICD-10-CM | POA: Diagnosis not present

## 2022-05-01 DIAGNOSIS — Z6820 Body mass index (BMI) 20.0-20.9, adult: Secondary | ICD-10-CM | POA: Diagnosis not present

## 2022-05-01 DIAGNOSIS — I252 Old myocardial infarction: Secondary | ICD-10-CM | POA: Diagnosis not present

## 2022-05-01 DIAGNOSIS — F4321 Adjustment disorder with depressed mood: Secondary | ICD-10-CM | POA: Diagnosis not present

## 2022-05-01 DIAGNOSIS — R54 Age-related physical debility: Secondary | ICD-10-CM | POA: Diagnosis not present

## 2022-05-01 DIAGNOSIS — H8112 Benign paroxysmal vertigo, left ear: Secondary | ICD-10-CM | POA: Diagnosis not present

## 2022-05-01 DIAGNOSIS — I5042 Chronic combined systolic (congestive) and diastolic (congestive) heart failure: Secondary | ICD-10-CM | POA: Diagnosis not present

## 2022-05-06 ENCOUNTER — Other Ambulatory Visit (HOSPITAL_COMMUNITY): Payer: Medicare Other

## 2022-05-15 ENCOUNTER — Ambulatory Visit (HOSPITAL_COMMUNITY): Payer: Medicare Other | Attending: Cardiology

## 2022-05-15 DIAGNOSIS — I5042 Chronic combined systolic (congestive) and diastolic (congestive) heart failure: Secondary | ICD-10-CM | POA: Insufficient documentation

## 2022-05-15 DIAGNOSIS — I340 Nonrheumatic mitral (valve) insufficiency: Secondary | ICD-10-CM | POA: Diagnosis not present

## 2022-05-15 LAB — ECHOCARDIOGRAM COMPLETE
Area-P 1/2: 4.33 cm2
S' Lateral: 3.6 cm

## 2022-05-20 DIAGNOSIS — I255 Ischemic cardiomyopathy: Secondary | ICD-10-CM | POA: Diagnosis not present

## 2022-05-20 DIAGNOSIS — I252 Old myocardial infarction: Secondary | ICD-10-CM | POA: Diagnosis not present

## 2022-05-20 DIAGNOSIS — I2510 Atherosclerotic heart disease of native coronary artery without angina pectoris: Secondary | ICD-10-CM | POA: Diagnosis not present

## 2022-05-20 DIAGNOSIS — I110 Hypertensive heart disease with heart failure: Secondary | ICD-10-CM | POA: Diagnosis not present

## 2022-05-20 DIAGNOSIS — I5042 Chronic combined systolic (congestive) and diastolic (congestive) heart failure: Secondary | ICD-10-CM | POA: Diagnosis not present

## 2022-05-20 DIAGNOSIS — R54 Age-related physical debility: Secondary | ICD-10-CM | POA: Diagnosis not present

## 2022-05-20 DIAGNOSIS — D649 Anemia, unspecified: Secondary | ICD-10-CM | POA: Diagnosis not present

## 2022-05-22 NOTE — Progress Notes (Unsigned)
Cardiology Office Note:    Date:  05/26/2022   ID:  Patricia Garcia, DOB June 22, 1933, MRN [id number]  PCP:  Darrol Jump, NP   [place]  Cardiologist:  Freada Bergeron, MD  Advanced Practice Provider:  Liliane Shi, PA-C Electrophysiologist:  None    Referring MD: Darrol Jump, NP    History of Present Illness:    Patricia Garcia is a 88 y.o. female with a hx of LBBB, hypertension, diverticulosis, arthritis, depression, right femur fracture s/p THA 05/02/2020, depression, osteopenia, chronic diastolic CHF, CAD s/p recent NSTEMI who presents to clinic for follow-up.   The patient suffered a mechanical fall resulting in hip fracture requiring surgery on 05/02/20.  She was readmitted to [facility] 05/06/20 with nausea, vomiting, and hypoxia.  She was ultimately found to have a NSTEMI (trops in 3k range) and acute on chronic diastolic heart failure.  She was treated with IV Lasix.  TTE 05/07/2020 showed hypokinesis of the basal inferior and inferolateral wall, EF 50 to 83%, grade 2 diastolic dysfunction, elevated left atrial pressure, mild mitral regurgitation.  She underwent cardiac cath 05/08/2020 showing three-vessel obstructive coronary artery disease as outlined below ultimately received DES to mid RCA and first diagonal.  Hospitalization was also notable for anemia requiring blood transfusion, hypokalemia, gait instability, hypokalemia, hypoalbuminemia, mild AKI, and nocturnal confusion.   During our visit on 08/24/20, the patient was very distressed as she had been feeling unwell for several weeks. Specifically, she stated that she had been feeling more fatigued, nervous, depressed and listless. She had to lay down after minimal activity. Noted blood in her stool about 4 days. No black or tarry stool. Had been dizzy in with position changes and rolling over in bed. No orthopnea, PND, LE edema. During that visit, we stopped her clonidine, changed  ticagrelor to plavix, started losartan and checked CBC and CXR which were normal. She then stopped her losartan as she was concerned about side-effects of feeling too tired and causing her to have crying episodes.    During visit on 09/10/20, she was feeling better. Was still having periods where she felt overwhelmed and sad. No further blood in her stool. She was looking forward to starting cardiac rehab.  Was doing overall well until presenting to the ED 03/2021 with nausea, vomiting, shortness of breath. Chest x-ray with small bilateral pleural effusions.  BNP elevated 1701.  Troponin negative x2.  She was admitted for CHF exacerbation.  Echo 12/07/2020 with LVEF 25-30%, mildly reduced RV systolic function.  Right and left heart cath 12/10/2020 with patent prior placement in RCA and first diagonal and otherwise stable CAD, mildly elevated right heart pressures.  She was diuresed from 123 to 118 pounds.   She was seen and followed by heart and vascular transitions of care clinic 12/21/2020 at which time her Lasix was reduced to 20 mg daily and spironolactone 12.5 mg daily initiated.   She was seen 12/28/2020 with chief complaint of insomnia and increasing dyspnea on exertion. Was noted to be drinking substantial amounts of fluid.  She declined SGLT2 I.  Her Lasix was increased to 40 mg twice daily for 3 days then transition to 40 mg daily.  She was arranged for IV iron infusion given iron deficiency anemia.  She was seen in the ED 03/13/21 with chest pain.  Her hs-Trops were neg (13>>13).  CXR was unremarkable.  EKG showed old LBBB.  Labs: Hgb 13.2, BNP 398, SCr 0.95, K+4.1,  ALP 122, ALT 19.  She was DC home and saw Richardson Dopp in follow-up. She was started on imdur at that time, but later stopped it because she stated she could not tolerate it. Repeat TTE 04/2021 showed improvement of LVEF 40-45%.   Last saw Richardson Dopp, PA in 05/2021 where she was doing well. Was more active without significant anginal or  HF symptoms.   She was last seen in clinic on 11/2021 where she was doing very well from a CV standpoint. Weight had been stable.   ***  Past Medical History:  Diagnosis Date   Anal or rectal pain    Anemia    Arthritis    CAD (coronary artery disease)    a. NSTEMI 04/2020 s/p DES to [facility] and D1.   Depression    Diverticulosis of colon (without mention of hemorrhage)    HFrEF (heart failure with reduced ejection fraction) ([place])    Echo 5/22: EF 25-30 // Echocardiogram 10/22: EF 40-45, mild LVH, Gr 1 DD, inf and inf-sept HK, normal RVSF, mild LAE, mild MR   Hypertension    Hypoalbuminemia    Internal hemorrhoids without mention of complication    Ischemic heart disease    LBBB (left bundle branch block)    Osteopenia    Renal insufficiency    S/P right hip fracture    Slow transit constipation     Past Surgical History:  Procedure Laterality Date   ABDOMINAL HYSTERECTOMY     CARDIAC CATHETERIZATION     CATARACT EXTRACTION, BILATERAL     CORONARY STENT INTERVENTION N/A 05/08/2020   Procedure: CORONARY STENT INTERVENTION;  Surgeon: Martinique, Peter M, MD;  Location: [place] [facility];  Service: Cardiovascular;  Laterality: N/A;   LEFT HEART CATH AND CORONARY ANGIOGRAPHY N/A 05/08/2020   Procedure: LEFT HEART CATH AND CORONARY ANGIOGRAPHY;  Surgeon: Martinique, Peter M, MD;  Location: [place] [facility];  Service: Cardiovascular;  Laterality: N/A;   MASS EXCISION  06/10/2011   Procedure: EXCISION MASS;  Surgeon: Wynonia Sours, MD;  Location: [place];  Service: Orthopedics;  Laterality: Right;  excision cyst right thumb IP, debridement IP right thumb   RIGHT/LEFT HEART CATH AND CORONARY ANGIOGRAPHY N/A 12/10/2020   Procedure: RIGHT/LEFT HEART CATH AND CORONARY ANGIOGRAPHY;  Surgeon: Troy Sine, MD;  Location: [place] [facility];  Service: Cardiovascular;  Laterality: N/A;   TONSILLECTOMY     TOTAL HIP ARTHROPLASTY Right 05/02/2020   Procedure: TOTAL HIP  ARTHROPLASTY ANTERIOR APPROACH;  Surgeon: Rod Can, MD;  Location: [facility];  Service: Orthopedics;  Laterality: Right;    Current Medications: Current Meds  Medication Sig   Acetaminophen (TYLENOL) 325 MG CAPS 2 tabs as needed Orally every 6 hrs   ALPRAZolam (XANAX) 0.25 MG tablet 1 tablet as needed Orally daily   bisoprolol (ZEBETA) 5 MG tablet Take 0.5 tablets (2.5 mg total) by mouth daily.   Calcium Carbonate-Vit D-Min (CALCIUM 1200 PO) Take 1,200 mg by mouth daily.   Cholecalciferol (VITAMIN D-3 PO) Take 1 capsule by mouth daily after supper.   clopidogrel (PLAVIX) 75 MG tablet Take 1 tablet (75 mg total) by mouth daily.   diclofenac Sodium (VOLTAREN) 1 % GEL Apply topically as needed. Taking 3 to 5 days   docusate sodium (COLACE) 100 MG capsule Take 100 mg by mouth in the morning.   furosemide (LASIX) 20 MG tablet Take 1 tablet (20 mg total) by mouth daily.   latanoprost (XALATAN) 0.005 %  ophthalmic solution Place 1 drop into both eyes at bedtime.   meclizine (ANTIVERT) 25 MG tablet 1 tablet as needed Orally every 12 hrs for 14 days   Multiple Vitamins-Minerals (MULTIVITAMIN ADULTS 50+) TABS Take 1 tablet by mouth daily with breakfast.   Omega-3 Fatty Acids (FISH OIL BURP-LESS) 1200 MG CAPS 1 capsule Orally Twice a day   pantoprazole (PROTONIX) 40 MG tablet Take 1 tablet (40 mg total) by mouth daily.   Polyethyl Glycol-Propyl Glycol (SYSTANE) 0.4-0.3 % SOLN as directed Ophthalmic   spironolactone (ALDACTONE) 25 MG tablet Take 0.5 tablets (12.5 mg total) by mouth daily.     Allergies:   Imdur [isosorbide nitrate], Alendronate sodium, Doxycycline, Eucalyptus oil, Linaclotide, Medrol [methylprednisolone], Penicillamine, Penicillins, Prednisone, Risedronate, Statins, Statins support [a-g pro], Statins support [acid blockers support], Sulfa antibiotics, Sulfonamide derivatives, Tramadol, Latex, and Tape   Social History   Socioeconomic History   Marital status: Widowed    Spouse  name: Not on file   Number of children: 2   Years of education: 12   Highest education level: High school graduate  Occupational History   Occupation: Retired    Fish farm manager: RETIRED  Tobacco Use   Smoking status: Former    Types: Cigarettes    Quit date: 06/05/1984    Years since quitting: 37.9   Smokeless tobacco: Never  Vaping Use   Vaping Use: Never used  Substance and Sexual Activity   Alcohol use: No   Drug use: No   Sexual activity: Not on file  Other Topics Concern   Not on file  Social History Narrative   Son mark local    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   Social Determinants of Health   Financial Resource Strain: [place]  (08/06/2021)   Overall Financial Resource Strain (CARDIA)    Difficulty of Paying Living Expenses: Not hard at all  Food Insecurity: Unknown (08/06/2021)   Hunger Vital Sign    Worried About Running Out of Food in the Last Year: Never true    [place] in the Last Year: Not on file  Transportation Needs: No Transportation Needs (08/06/2021)   PRAPARE - Hydrologist (Medical): No    Lack of Transportation (Non-Medical): No  Physical Activity: Insufficiently Active (10/25/2020)   Exercise Vital Sign    Days of Exercise per Week: 2 days    Minutes of Exercise per Session: 20 min  Stress: No Stress Concern Present (08/06/2021)   [place]    Feeling of Stress : Only a little  Social Connections: Moderately Integrated (10/25/2020)   Social Connection and Isolation Panel [NHANES]    Frequency of Communication with Friends and Family: More than three times a week    Frequency of Social Gatherings with Friends and Family: More than three times a week    Attends Religious Services: 1 to 4 times per year    Active Member of [employer] or Organizations: Yes    Attends Archivist Meetings: 1 to 4 times per  year    Marital Status: Widowed     Family History: The patient's family history includes Cancer in her father; Colon polyps in her mother; Heart disease in her father.  ROS:   Please see the history of present illness.    Review of Systems  Constitutional:  Negative for chills and fever.  HENT:  Negative for ear discharge and hearing loss.   Eyes:  Negative for blurred vision and redness.  Respiratory:  Negative for shortness of breath.   Cardiovascular:  Negative for chest pain, palpitations, orthopnea, claudication, leg swelling and PND.  Gastrointestinal:  Negative for blood in stool and melena.  Genitourinary:  Negative for dysuria and flank pain.  Musculoskeletal:  Positive for neck pain. Negative for falls.  Neurological:  Positive for dizziness. Negative for loss of consciousness.  Endo/Heme/Allergies:  Negative for polydipsia. Bruises/bleeds easily.  Psychiatric/Behavioral:  Negative for substance abuse.     EKGs/Labs/Other Studies Reviewed:    The following studies were reviewed today: TTE 05-29-2022: IMPRESSIONS     1. Left ventricular ejection fraction, by estimation, is 45 to 50%. The  left ventricle has mildly decreased function. The left ventricle  demonstrates global hypokinesis with septal-lateral dyssynchrony  consistent with LBBB. Left ventricular diastolic  parameters are consistent with Grade I diastolic dysfunction (impaired  relaxation).   2. Right ventricular systolic function is normal. The right ventricular  size is normal. There is normal pulmonary artery systolic pressure. The  estimated right ventricular systolic pressure is 84.1 mmHg.   3. The mitral valve is normal in structure. Mild mitral valve  regurgitation. No evidence of mitral stenosis.   4. The aortic valve is tricuspid. There is mild calcification of the  aortic valve. Aortic valve regurgitation is not visualized. No aortic  stenosis is present.   5. The inferior vena cava is normal in  size with greater than 50%  respiratory variability, suggesting right atrial pressure of 3 mmHg.   TTE 05-29-2021: IMPRESSIONS    1. Left ventricular ejection fraction, by estimation, is 40 to 45%. The  left ventricle has mildly decreased function. The left ventricle  demonstrates regional wall motion abnormalities (see scoring  diagram/findings for description). There is mild left  ventricular hypertrophy of the basal-septal segment. Left ventricular  diastolic parameters are consistent with Grade I diastolic dysfunction  (impaired relaxation). There is severe hypokinesis of the left  ventricular, basal inferior wall and inferoseptal  wall.   2. Right ventricular systolic function is normal. The right ventricular  size is normal.   3. Left atrial size was mildly dilated.   4. The mitral valve is normal in structure. Mild mitral valve  regurgitation. No evidence of mitral stenosis.   5. The aortic valve is normal in structure. There is mild calcification  of the aortic valve. Aortic valve regurgitation is not visualized. No  aortic stenosis is present.   6. The inferior vena cava is normal in size with greater than 50%  respiratory variability, suggesting right atrial pressure of 3 mmHg.   RIGHT/LEFT HEART CATH AND CORONARY ANGIOGRAPHY 12/10/2020 Narrative  Prox Cx to Mid Cx lesion is 85% stenosed.  Ramus lesion is 30% stenosed.  Previously placed 1st Diag stent (unknown type) is widely patent.  Mid LAD to Dist LAD lesion is 60% stenosed.  Mid LAD lesion is 40% stenosed.  Dist RCA lesion is 60% stenosed.  Mid RCA lesion is 30% stenosed.  Previously placed Prox RCA stent (unknown type) is widely patent.   Widely patent stents in the high diagonal and proximal RCA inserted in 05/29/20.   The LAD has tortuosity in its mid segment with 40 and 60% narrowing.  This is not significantly changed from the prior angiogram.   Mild disease in the ramus immediate vessel.    Previously noted very small AV groove circumflex with diffuse  narrowing of 80%.   Dominant RCA with patent proximal stent and mild 30% mid stenosis with 50 to 60% narrowing before the PDA takeoff.   Mild right heart pressure elevation pulmonary hypertension.   RECOMMENDATION: Guideline directed medical therapy for heart failure with reduced EF on recent echo at 25 to 30%.  Medical therapy for concomitant CAD.     ECHOCARDIOGRAM 12/07/20 EF 25-30, inferior, inferolateral and septal HK, mild asymmetric LVH, mildly reduced RVSF, mild-moderate MR, moderate MAC, trivial AI, mild AV sclerosis without stenosis   GATED SPECT MYO PERF W/LEXISCAN STRESS 1D 08/29/2019 No ischemia or infarct ECG with LBBB EF estimated at 46% abnormal septal motion and apical hypokinesis Suggest echo correlation  Cardiac Cath 05/08/20 1st Diag lesion is 85% stenosed. Ramus lesion is 30% stenosed. Prox Cx to Mid Cx lesion is 90% stenosed. Prox RCA to Mid RCA lesion is 95% stenosed. Post intervention, there is a 0% residual stenosis. A drug-eluting stent was successfully placed using a STENT RESOLUTE ONYX 3.0X15. Post intervention, there is a 0% residual stenosis. A drug-eluting stent was successfully placed using a [place] 2.25X15. LV end diastolic pressure is mildly elevated.   1. 3 vessel obstructive CAD    - 95% mid RCA- this appeared to be the culprit lesion    - 85% proxima first diagonal    - 90% very small LCx in the AV groove 2. Mildly elevated LVEDP 3. Successful PCI of the mid RCA with DES x 1 4. Successful PCI of the first diagonal with DES x 1   Plan: DAPT for one year. Will transition lasix to po. Further plans per primary team.     Echo 05/07/20 IMPRESSIONS   1. Hypokinesis of the basal inferior and inferolateral wall with overall  preserved LV function.   2. Left ventricular ejection fraction, by estimation, is 50 to 55%. The  left ventricle has low normal function. The  left ventricle demonstrates  regional wall motion abnormalities (see scoring diagram/findings for  description). Left ventricular diastolic   parameters are consistent with Grade II diastolic dysfunction  (pseudonormalization). Elevated left atrial pressure.   3. Right ventricular systolic function is normal. The right ventricular  size is normal. There is mildly elevated pulmonary artery systolic  pressure.   4. Moderate pleural effusion in the left lateral region.   5. The mitral valve is normal in structure. Mild mitral valve  regurgitation. No evidence of mitral stenosis.   6. The aortic valve is tricuspid. Aortic valve regurgitation is trivial.  No aortic stenosis is present.   7. The inferior vena cava is normal in size with greater than 50%  respiratory variability, suggesting right atrial pressure of 3 mmHg.   FINDINGS   Left Ventricle: Left ventricular ejection fraction, by estimation, is 50  to 55%. The left ventricle has low normal function. The left ventricle  demonstrates regional wall motion abnormalities. The left ventricular  internal cavity size was normal in  size. There is no left ventricular hypertrophy. Left ventricular diastolic  parameters are consistent with Grade II diastolic dysfunction  (pseudonormalization). Elevated left atrial pressure.   Right Ventricle: The right ventricular size is normal.Right ventricular  systolic function is normal. There is mildly elevated pulmonary artery  systolic pressure. The tricuspid regurgitant velocity is 3.08 m/s, and  with an assumed right atrial pressure of   3 mmHg, the estimated right ventricular systolic pressure is 43.3 mmHg.   Left Atrium: Left atrial size was normal in size.  Right Atrium: Right atrial size was normal in size.   Pericardium: There is no evidence of pericardial effusion.   Mitral Valve: The mitral valve is normal in structure. Mild mitral annular  calcification. Mild mitral valve  regurgitation. No evidence of mitral  valve stenosis.   Tricuspid Valve: The tricuspid valve is normal in structure. Tricuspid  valve regurgitation is mild . No evidence of tricuspid stenosis.   Aortic Valve: The aortic valve is tricuspid. Aortic valve regurgitation is  trivial. No aortic stenosis is present.   Pulmonic Valve: The pulmonic valve was normal in structure. Pulmonic valve  regurgitation is trivial. No evidence of pulmonic stenosis.   Aorta: The aortic root is normal in size and structure.   Venous: The inferior vena cava is normal in size with greater than 50%  respiratory variability, suggesting right atrial pressure of 3 mmHg.      EKG:  EKG is personally reviewed. 11/22/2021:  EKG was not ordered. 03/14/2021 (ED): Sinus rhythm at 65 bpm with 1st degree A-V block, Left axis deviation, Left bundle branch block. 10/15/2020: NSR, 1 degree AVB, LBBB (chronic)   Recent Labs: No results found for requested labs within last 365 days.   Recent Lipid Panel    Component Value Date/Time   CHOL 251 (H) 11/26/2021 0916   TRIG 203 (H) 11/26/2021 0916   HDL 42 11/26/2021 0916   CHOLHDL 6.0 (H) 11/26/2021 0916   CHOLHDL 4.5 12/10/2020 0511   VLDL 29 12/10/2020 0511   LDLCALC 171 (H) 11/26/2021 0916     Physical Exam:    VS:  BP 130/76   Pulse 66   Ht _0  (1.6 m)   Wt 121 lb (54.9 kg)   SpO2 96%   BMI 21.43 kg/m     Wt Readings from Last 3 Encounters:  05/26/22 121 lb (54.9 kg)  11/22/21 119 lb (54 kg)  11/18/21 115 lb (52.2 kg)     GEN:  Well nourished, well developed in no acute distress HEENT: Normal NECK: No JVD; No carotid bruits CARDIAC: RRR, no murmurs, rubs, gallops RESPIRATORY:  Clear to auscultation without rales, wheezing or rhonchi  ABDOMEN: Soft, non-tender, non-distended MUSCULOSKELETAL:  No edema; No deformity  SKIN: Warm and dry NEUROLOGIC:  Alert and oriented x 3 PSYCHIATRIC:  Normal affect   ASSESSMENT:    No diagnosis  found.  PLAN:    In order of problems listed above:  #NSTEMI: #Multivessel CAD: Patient hospitalized 04/2020 for NSTEMI found to have multivessel CAD s/p successful PCI to RCA and D1 on 05/08/20. Repeat cath 11/2020 demonstrated patent stents in the diagonal and RCA.  She does have moderate disease in LAD, mid to distal RCA and high-grade stenosis in a small AV groove circumflex.  She has been managed medically. She is currently doing well without anginal symptoms.  -Stop ASA -Continue clopidogrel 75 mg daily -Continue bisoprolol 2.5 mg daily -Intolerant to statins -Did not tolerate ARB, imdur, metop   #Chronic Combined Systolic and Diastolic HF: EF dropped from 50-55% to 25-30% in 11/2020 but now improved back to 40-45% on TTE in 04/2021. Cath in 11/2020 with patent RCA and D1 stents, moderate disease in LAD, mid to distal RCA and high-grade stenosis in a small AV groove circumflex. Soft blood pressure and medication intolerances have limited GDMT. Currently euvolemic on exam. Dry Wt: 116lbs. -Continue bisoprolol 2.5 mg daily -Continue spironolactone 12.5 mg daily -Continue furosemide 20 mg daily -Not on ARB as did not tolerate -Declined SGLT2i -  Low Na diet  #Hypertension: Well controlled and at goal. No hypotension. -Continue bisoprolol 2.5 mg daily -Continue spironolactone 12.5 mg daily   #HLD: #Statin Intolerance: Did not tolerate statins or zetia. Has difficulty with medications due to may intolerances. She would like to continue with lifestyle modifications. -Check lipid profile for monitoring -Could consider bempedoic acid if patient amenable but she has had significant intolerances in the past   #Depression: -Follow-up with PCP  Follow-up:  6 months.  Medication Adjustments/Labs and Tests Ordered: Current medicines are reviewed at length with the patient today.  Concerns regarding medicines are outlined above.   No orders of the defined types were placed in this  encounter.  No orders of the defined types were placed in this encounter.  There are no Patient Instructions on file for this visit.   I,Mathew Stumpf,acting as a Education administrator for Freada Bergeron, MD.,have documented all relevant documentation on the behalf of Freada Bergeron, MD,as directed by  Freada Bergeron, MD while in the presence of Freada Bergeron, MD.  I, Freada Bergeron, MD, have reviewed all documentation for this visit. The documentation on 05/26/22 for the exam, diagnosis, procedures, and orders are all accurate and complete.    Signed, Freada Bergeron, MD  05/26/2022 11:49 AM    [place]

## 2022-05-23 DIAGNOSIS — I10 Essential (primary) hypertension: Secondary | ICD-10-CM | POA: Diagnosis not present

## 2022-05-23 DIAGNOSIS — M79672 Pain in left foot: Secondary | ICD-10-CM | POA: Diagnosis not present

## 2022-05-26 ENCOUNTER — Encounter: Payer: Self-pay | Admitting: Cardiology

## 2022-05-26 ENCOUNTER — Ambulatory Visit: Payer: Medicare Other | Attending: Cardiology | Admitting: Cardiology

## 2022-05-26 VITALS — BP 130/76 | HR 66 | Ht 63.0 in | Wt 121.0 lb

## 2022-05-26 DIAGNOSIS — E785 Hyperlipidemia, unspecified: Secondary | ICD-10-CM | POA: Insufficient documentation

## 2022-05-26 DIAGNOSIS — I2510 Atherosclerotic heart disease of native coronary artery without angina pectoris: Secondary | ICD-10-CM | POA: Diagnosis not present

## 2022-05-26 DIAGNOSIS — I447 Left bundle-branch block, unspecified: Secondary | ICD-10-CM | POA: Diagnosis not present

## 2022-05-26 DIAGNOSIS — I25118 Atherosclerotic heart disease of native coronary artery with other forms of angina pectoris: Secondary | ICD-10-CM | POA: Diagnosis not present

## 2022-05-26 DIAGNOSIS — I10 Essential (primary) hypertension: Secondary | ICD-10-CM | POA: Diagnosis not present

## 2022-05-26 DIAGNOSIS — Z79899 Other long term (current) drug therapy: Secondary | ICD-10-CM | POA: Insufficient documentation

## 2022-05-26 DIAGNOSIS — I5042 Chronic combined systolic (congestive) and diastolic (congestive) heart failure: Secondary | ICD-10-CM | POA: Insufficient documentation

## 2022-05-26 DIAGNOSIS — I5020 Unspecified systolic (congestive) heart failure: Secondary | ICD-10-CM | POA: Insufficient documentation

## 2022-05-26 MED ORDER — PANTOPRAZOLE SODIUM 40 MG PO TBEC: 40.0000 mg | DELAYED_RELEASE_TABLET | Freq: Every day | ORAL | 3 refills | Status: DC

## 2022-05-26 MED ORDER — SPIRONOLACTONE 25 MG PO TABS: 12.5000 mg | ORAL_TABLET | Freq: Every day | ORAL | 3 refills | Status: DC

## 2022-05-26 MED ORDER — FUROSEMIDE 20 MG PO TABS: 20.0000 mg | ORAL_TABLET | Freq: Every day | ORAL | 3 refills | Status: DC

## 2022-05-26 NOTE — Patient Instructions (Signed)
Medication Instructions:   Your physician recommends that you continue on your current medications as directed. Please refer to the Current Medication list given to you today.  *If you need a refill on your cardiac medications before your next appointment, please call your pharmacy*   Follow-Up: At [facility], you and your health needs are our priority.  As part of our continuing mission to provide you with exceptional heart care, we have created designated Provider Care Teams.  These Care Teams include your primary Cardiologist (physician) and Advanced Practice Providers (APPs -  Physician Assistants and Nurse Practitioners) who all work together to provide you with the care you need, when you need it.  We recommend signing up for the patient portal called "MyChart".  Sign up information is provided on this After Visit Summary.  MyChart is used to connect with patients for Virtual Visits (Telemedicine).  Patients are able to view lab/test results, encounter notes, upcoming appointments, etc.  Non-urgent messages can be sent to your provider as well.   To learn more about what you can do with MyChart, go to [email or web address].    Your next appointment:   6 month(s)  The format for your next appointment:   In Person  Provider:   Heather E Pemberton, MD     Important Information About Sugar       

## 2022-05-26 NOTE — Progress Notes (Signed)
Cardiology Office Note:    Date:  05/26/2022   ID:  Patricia Garcia, DOB [birth year], MRN [id number]  PCP:  Darrol Jump, NP   [place]  Cardiologist:  Freada Bergeron, MD  Advanced Practice Provider:  Liliane Shi, PA-C Electrophysiologist:  None   Referring MD: Darrol Jump, NP    History of Present Illness:    Patricia Garcia is a 88 y.o. female with a hx of LBBB, hypertension, diverticulosis, arthritis, depression, right femur fracture s/p THA 05/02/2020, depression, osteopenia, chronic diastolic CHF, CAD s/p recent NSTEMI who presents to clinic for follow-up.   The patient suffered a mechanical fall resulting in hip fracture requiring surgery on 05/02/20.  She was readmitted to [facility] 05/06/20 with nausea, vomiting, and hypoxia.  She was ultimately found to have a NSTEMI (trops in 3k range) and acute on chronic diastolic heart failure.  She was treated with IV Lasix.  TTE 05/07/2020 showed hypokinesis of the basal inferior and inferolateral wall, EF 50 to 62%, grade 2 diastolic dysfunction, elevated left atrial pressure, mild mitral regurgitation.  She underwent cardiac cath 05/08/2020 showing three-vessel obstructive coronary artery disease as outlined below ultimately received DES to mid RCA and first diagonal.  Hospitalization was also notable for anemia requiring blood transfusion, hypokalemia, gait instability, hypokalemia, hypoalbuminemia, mild AKI, and nocturnal confusion.   During our visit on 08/24/20, the patient was very distressed as she had been feeling unwell for several weeks. Specifically, she stated that she had been feeling more fatigued, nervous, depressed and listless. She had to lay down after minimal activity. Noted blood in her stool about 4 days. No black or tarry stool. Had been dizzy in with position changes and rolling over in bed. No orthopnea, PND, LE edema. During that visit, we stopped her clonidine, changed  ticagrelor to plavix, started losartan and checked CBC and CXR which were normal. She then stopped her losartan as she was concerned about side-effects of feeling too tired and causing her to have crying episodes.    During visit on 09/10/20, she was feeling better. Was still having periods where she felt overwhelmed and sad. No further blood in her stool. She was looking forward to starting cardiac rehab.  Was doing overall well until presenting to the ED 03/2021 with nausea, vomiting, shortness of breath. Chest x-ray with small bilateral pleural effusions.  BNP elevated 1701.  Troponin negative x2.  She was admitted for CHF exacerbation.  Echo 12/07/2020 with LVEF 25-30%, mildly reduced RV systolic function.  Right and left heart cath 12/10/2020 with patent prior placement in RCA and first diagonal and otherwise stable CAD, mildly elevated right heart pressures.  She was diuresed from 123 to 118 pounds.   She was seen and followed by heart and vascular transitions of care clinic 12/21/2020 at which time her Lasix was reduced to 20 mg daily and spironolactone 12.5 mg daily initiated.   She was seen 12/28/2020 with chief complaint of insomnia and increasing dyspnea on exertion. Was noted to be drinking substantial amounts of fluid.  She declined SGLT2 I.  Her Lasix was increased to 40 mg twice daily for 3 days then transition to 40 mg daily.  She was arranged for IV iron infusion given iron deficiency anemia.  She was seen in the ED 03/13/21 with chest pain.  Her hs-Trops were neg (13>>13).  CXR was unremarkable.  EKG showed old LBBB.  Labs: Hgb 13.2, BNP 398, SCr 0.95, K+4.1, ALP  122, ALT 19.  She was DC home and saw Richardson Dopp in follow-up. She was started on imdur at that time, but later stopped it because she stated she could not tolerate it. Repeat TTE 04/2021 showed improvement of LVEF 40-45%.   Last saw Richardson Dopp, PA in 05/2021 where she was doing well. Was more active without significant anginal or  HF symptoms.   She was last seen in clinic on 11/2021 where she was doing very well from a CV standpoint. Weight had been stable.   Today, the patient states that she is having foot pain this past week. She has noticed erythema of her left heel as well. She recently attended a wedding and had been on her feet in heels a lot over 2 days. Of note, she had tried a medication that caused her blood pressure to become very elevated. At this time her symptoms seem to be gradually improving.  Otherwise she is feeling good from a cardiovascular perspective. No chest pain or shortness of breath. She is taking 20 mg furosemide which is managing her edema well.  She continues to have arthritis in her neck, and TMJ. She is using Body Balm which contains magnesium and is improving her pain. This is also helping her knee pain while walking.  She denies any palpitations, lightheadedness, headaches, syncope, orthopnea, or PND.  She is up to date with her flu vaccination.   Past Medical History:  Diagnosis Date   Anal or rectal pain    Anemia    Arthritis    CAD (coronary artery disease)    a. NSTEMI 04/2020 s/p DES to [facility] and D1.   Depression    Diverticulosis of colon (without mention of hemorrhage)    HFrEF (heart failure with reduced ejection fraction) ([place])    Echo 5/22: EF 25-30 // Echocardiogram 10/22: EF 40-45, mild LVH, Gr 1 DD, inf and inf-sept HK, normal RVSF, mild LAE, mild MR   Hypertension    Hypoalbuminemia    Internal hemorrhoids without mention of complication    Ischemic heart disease    LBBB (left bundle branch block)    Osteopenia    Renal insufficiency    S/P right hip fracture    Slow transit constipation     Past Surgical History:  Procedure Laterality Date   ABDOMINAL HYSTERECTOMY     CARDIAC CATHETERIZATION     CATARACT EXTRACTION, BILATERAL     CORONARY STENT INTERVENTION N/A 05/08/2020   Procedure: CORONARY STENT INTERVENTION;  Surgeon: Martinique, Peter M, MD;   Location: [place] [facility];  Service: Cardiovascular;  Laterality: N/A;   LEFT HEART CATH AND CORONARY ANGIOGRAPHY N/A 05/08/2020   Procedure: LEFT HEART CATH AND CORONARY ANGIOGRAPHY;  Surgeon: Martinique, Peter M, MD;  Location: [place] [facility];  Service: Cardiovascular;  Laterality: N/A;   MASS EXCISION  06/10/2011   Procedure: EXCISION MASS;  Surgeon: Wynonia Sours, MD;  Location: [place];  Service: Orthopedics;  Laterality: Right;  excision cyst right thumb IP, debridement IP right thumb   RIGHT/LEFT HEART CATH AND CORONARY ANGIOGRAPHY N/A 12/10/2020   Procedure: RIGHT/LEFT HEART CATH AND CORONARY ANGIOGRAPHY;  Surgeon: Troy Sine, MD;  Location: [place] [facility];  Service: Cardiovascular;  Laterality: N/A;   TONSILLECTOMY     TOTAL HIP ARTHROPLASTY Right 05/02/2020   Procedure: TOTAL HIP ARTHROPLASTY ANTERIOR APPROACH;  Surgeon: Rod Can, MD;  Location: [facility];  Service: Orthopedics;  Laterality: Right;    Current  Medications: Current Meds  Medication Sig   Acetaminophen (TYLENOL) 325 MG CAPS 2 tabs as needed Orally every 6 hrs   ALPRAZolam (XANAX) 0.25 MG tablet 1 tablet as needed Orally daily   bisoprolol (ZEBETA) 5 MG tablet Take 0.5 tablets (2.5 mg total) by mouth daily.   Calcium Carbonate-Vit D-Min (CALCIUM 1200 PO) Take 1,200 mg by mouth daily.   Cholecalciferol (VITAMIN D-3 PO) Take 1 capsule by mouth daily after supper. Per Patient taking 1,000 units daily   clopidogrel (PLAVIX) 75 MG tablet Take 1 tablet (75 mg total) by mouth daily.   diclofenac Sodium (VOLTAREN) 1 % GEL Apply topically as needed. Taking 3 to 5 days   docusate sodium (COLACE) 100 MG capsule Take 100 mg by mouth in the morning.   latanoprost (XALATAN) 0.005 % ophthalmic solution Place 1 drop into both eyes at bedtime.   meclizine (ANTIVERT) 25 MG tablet 1 tablet as needed Orally every 12 hrs for 14 days   Multiple Vitamins-Minerals (MULTIVITAMIN ADULTS 50+) TABS Take 1 tablet by  mouth daily with breakfast.   Omega-3 Fatty Acids (FISH OIL BURP-LESS) 1200 MG CAPS 1 capsule Orally Twice a day   Polyethyl Glycol-Propyl Glycol (SYSTANE) 0.4-0.3 % SOLN as directed Ophthalmic   [DISCONTINUED] furosemide (LASIX) 20 MG tablet Take 1 tablet (20 mg total) by mouth daily.   [DISCONTINUED] pantoprazole (PROTONIX) 40 MG tablet Take 1 tablet (40 mg total) by mouth daily.   [DISCONTINUED] spironolactone (ALDACTONE) 25 MG tablet Take 0.5 tablets (12.5 mg total) by mouth daily.     Allergies:   Imdur [isosorbide nitrate], Alendronate sodium, Doxycycline, Eucalyptus oil, Linaclotide, Medrol [methylprednisolone], Penicillamine, Penicillins, Prednisone, Risedronate, Statins, Statins support [a-g pro], Statins support [acid blockers support], Sulfa antibiotics, Sulfonamide derivatives, Tramadol, Latex, and Tape   Social History   Socioeconomic History   Marital status: Widowed    Spouse name: Not on file   Number of children: 2   Years of education: 12   Highest education level: High school graduate  Occupational History   Occupation: Retired    Fish farm manager: RETIRED  Tobacco Use   Smoking status: Former    Types: Cigarettes    Quit date: 06/05/1984    Years since quitting: 37.9   Smokeless tobacco: Never  Vaping Use   Vaping Use: Never used  Substance and Sexual Activity   Alcohol use: No   Drug use: No   Sexual activity: Not on file  Other Topics Concern   Not on file  Social History Narrative   Son mark local    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   Social Determinants of Health   Financial Resource Strain: [place]  (08/06/2021)   Overall Financial Resource Strain (CARDIA)    Difficulty of Paying Living Expenses: Not hard at all  Food Insecurity: Unknown (08/06/2021)   Hunger Vital Sign    Worried About Running Out of Food in the Last Year: Never true    [place] in the Last Year: Not on file  Transportation  Needs: No Transportation Needs (08/06/2021)   PRAPARE - Hydrologist (Medical): No    Lack of Transportation (Non-Medical): No  Physical Activity: Insufficiently Active (10/25/2020)   Exercise Vital Sign    Days of Exercise per Week: 2 days    Minutes of Exercise per Session: 20 min  Stress: No Stress Concern Present (08/06/2021)   [employer] of  Occupational Health - Occupational Stress Questionnaire    Feeling of Stress : Only a little  Social Connections: Moderately Integrated (10/25/2020)   Social Connection and Isolation Panel [NHANES]    Frequency of Communication with Friends and Family: More than three times a week    Frequency of Social Gatherings with Friends and Family: More than three times a week    Attends Religious Services: 1 to 4 times per year    Active Member of [employer] or Organizations: Yes    Attends Archivist Meetings: 1 to 4 times per year    Marital Status: Widowed     Family History: The patient's family history includes Cancer in her father; Colon polyps in her mother; Heart disease in her father.  ROS:   Please see the history of present illness.    Review of Systems  Constitutional:  Negative for chills and fever.  HENT:  Negative for ear discharge and hearing loss.   Eyes:  Negative for blurred vision and redness.  Respiratory:  Negative for shortness of breath.   Cardiovascular:  Negative for chest pain, palpitations, orthopnea, claudication, leg swelling and PND.  Gastrointestinal:  Negative for blood in stool and melena.  Genitourinary:  Negative for dysuria and flank pain.  Musculoskeletal:  Positive for joint pain and neck pain. Negative for falls.  Neurological:  Positive for dizziness. Negative for loss of consciousness.  Endo/Heme/Allergies:  Negative for polydipsia. Bruises/bleeds easily.  Psychiatric/Behavioral:  Negative for substance abuse.     EKGs/Labs/Other Studies Reviewed:    The following  studies were reviewed today:  TTE 04/2022: IMPRESSIONS    1. Left ventricular ejection fraction, by estimation, is 45 to 50%. The  left ventricle has mildly decreased function. The left ventricle  demonstrates global hypokinesis with septal-lateral dyssynchrony  consistent with LBBB. Left ventricular diastolic  parameters are consistent with Grade I diastolic dysfunction (impaired  relaxation).   2. Right ventricular systolic function is normal. The right ventricular  size is normal. There is normal pulmonary artery systolic pressure. The  estimated right ventricular systolic pressure is 19.3 mmHg.   3. The mitral valve is normal in structure. Mild mitral valve  regurgitation. No evidence of mitral stenosis.   4. The aortic valve is tricuspid. There is mild calcification of the  aortic valve. Aortic valve regurgitation is not visualized. No aortic  stenosis is present.   5. The inferior vena cava is normal in size with greater than 50%  respiratory variability, suggesting right atrial pressure of 3 mmHg.   TTE 04/2021: IMPRESSIONS    1. Left ventricular ejection fraction, by estimation, is 40 to 45%. The  left ventricle has mildly decreased function. The left ventricle  demonstrates regional wall motion abnormalities (see scoring  diagram/findings for description). There is mild left  ventricular hypertrophy of the basal-septal segment. Left ventricular  diastolic parameters are consistent with Grade I diastolic dysfunction  (impaired relaxation). There is severe hypokinesis of the left  ventricular, basal inferior wall and inferoseptal  wall.   2. Right ventricular systolic function is normal. The right ventricular  size is normal.   3. Left atrial size was mildly dilated.   4. The mitral valve is normal in structure. Mild mitral valve  regurgitation. No evidence of mitral stenosis.   5. The aortic valve is normal in structure. There is mild calcification  of the aortic  valve. Aortic valve regurgitation is not visualized. No  aortic stenosis is present.   6. The  inferior vena cava is normal in size with greater than 50%  respiratory variability, suggesting right atrial pressure of 3 mmHg.   RIGHT/LEFT HEART CATH AND CORONARY ANGIOGRAPHY 12/10/2020 Narrative  Prox Cx to Mid Cx lesion is 85% stenosed.  Ramus lesion is 30% stenosed.  Previously placed 1st Diag stent (unknown type) is widely patent.  Mid LAD to Dist LAD lesion is 60% stenosed.  Mid LAD lesion is 40% stenosed.  Dist RCA lesion is 60% stenosed.  Mid RCA lesion is 30% stenosed.  Previously placed Prox RCA stent (unknown type) is widely patent.   Widely patent stents in the high diagonal and proximal RCA inserted in October 2021.   The LAD has tortuosity in its mid segment with 40 and 60% narrowing.  This is not significantly changed from the prior angiogram.   Mild disease in the ramus immediate vessel.   Previously noted very small AV groove circumflex with diffuse narrowing of 80%.   Dominant RCA with patent proximal stent and mild 30% mid stenosis with 50 to 60% narrowing before the PDA takeoff.   Mild right heart pressure elevation pulmonary hypertension.   RECOMMENDATION: Guideline directed medical therapy for heart failure with reduced EF on recent echo at 25 to 30%.  Medical therapy for concomitant CAD.     ECHOCARDIOGRAM 12/07/20 EF 25-30, inferior, inferolateral and septal HK, mild asymmetric LVH, mildly reduced RVSF, mild-moderate MR, moderate MAC, trivial AI, mild AV sclerosis without stenosis   GATED SPECT MYO PERF W/LEXISCAN STRESS 1D 08/29/2019 No ischemia or infarct ECG with LBBB EF estimated at 46% abnormal septal motion and apical hypokinesis Suggest echo correlation  Cardiac Cath 05/08/20 1st Diag lesion is 85% stenosed. Ramus lesion is 30% stenosed. Prox Cx to Mid Cx lesion is 90% stenosed. Prox RCA to Mid RCA lesion is 95% stenosed. Post  intervention, there is a 0% residual stenosis. A drug-eluting stent was successfully placed using a STENT RESOLUTE ONYX 3.0X15. Post intervention, there is a 0% residual stenosis. A drug-eluting stent was successfully placed using a [place] 2.25X15. LV end diastolic pressure is mildly elevated.   1. 3 vessel obstructive CAD    - 95% mid RCA- this appeared to be the culprit lesion    - 85% proxima first diagonal    - 90% very small LCx in the AV groove 2. Mildly elevated LVEDP 3. Successful PCI of the mid RCA with DES x 1 4. Successful PCI of the first diagonal with DES x 1   Plan: DAPT for one year. Will transition lasix to po. Further plans per primary team.     Echo 05/07/20 IMPRESSIONS   1. Hypokinesis of the basal inferior and inferolateral wall with overall  preserved LV function.   2. Left ventricular ejection fraction, by estimation, is 50 to 55%. The  left ventricle has low normal function. The left ventricle demonstrates  regional wall motion abnormalities (see scoring diagram/findings for  description). Left ventricular diastolic   parameters are consistent with Grade II diastolic dysfunction  (pseudonormalization). Elevated left atrial pressure.   3. Right ventricular systolic function is normal. The right ventricular  size is normal. There is mildly elevated pulmonary artery systolic  pressure.   4. Moderate pleural effusion in the left lateral region.   5. The mitral valve is normal in structure. Mild mitral valve  regurgitation. No evidence of mitral stenosis.   6. The aortic valve is tricuspid. Aortic valve regurgitation is trivial.  No aortic stenosis is present.  7. The inferior vena cava is normal in size with greater than 50%  respiratory variability, suggesting right atrial pressure of 3 mmHg.   FINDINGS   Left Ventricle: Left ventricular ejection fraction, by estimation, is 50  to 55%. The left ventricle has low normal function. The left  ventricle  demonstrates regional wall motion abnormalities. The left ventricular  internal cavity size was normal in  size. There is no left ventricular hypertrophy. Left ventricular diastolic  parameters are consistent with Grade II diastolic dysfunction  (pseudonormalization). Elevated left atrial pressure.   Right Ventricle: The right ventricular size is normal.Right ventricular  systolic function is normal. There is mildly elevated pulmonary artery  systolic pressure. The tricuspid regurgitant velocity is 3.08 m/s, and  with an assumed right atrial pressure of   3 mmHg, the estimated right ventricular systolic pressure is 38.1 mmHg.   Left Atrium: Left atrial size was normal in size.   Right Atrium: Right atrial size was normal in size.   Pericardium: There is no evidence of pericardial effusion.   Mitral Valve: The mitral valve is normal in structure. Mild mitral annular  calcification. Mild mitral valve regurgitation. No evidence of mitral  valve stenosis.   Tricuspid Valve: The tricuspid valve is normal in structure. Tricuspid  valve regurgitation is mild . No evidence of tricuspid stenosis.   Aortic Valve: The aortic valve is tricuspid. Aortic valve regurgitation is  trivial. No aortic stenosis is present.   Pulmonic Valve: The pulmonic valve was normal in structure. Pulmonic valve  regurgitation is trivial. No evidence of pulmonic stenosis.   Aorta: The aortic root is normal in size and structure.   Venous: The inferior vena cava is normal in size with greater than 50%  respiratory variability, suggesting right atrial pressure of 3 mmHg.     EKG:  EKG is personally reviewed. 05/26/2022:  EKG was not ordered. 11/22/2021:  EKG was not ordered. 03/14/2021 (ED): Sinus rhythm at 65 bpm with 1st degree A-V block, Left axis deviation, Left bundle branch block. 10/15/2020: NSR, 1 degree AVB, LBBB (chronic)   Recent Labs: No results found for requested labs within last 365  days.   Recent Lipid Panel    Component Value Date/Time   CHOL 251 (H) 11/26/2021 0916   TRIG 203 (H) 11/26/2021 0916   HDL 42 11/26/2021 0916   CHOLHDL 6.0 (H) 11/26/2021 0916   CHOLHDL 4.5 12/10/2020 0511   VLDL 29 12/10/2020 0511   LDLCALC 171 (H) 11/26/2021 0916     Physical Exam:    VS:  BP 130/76   Pulse 66   Ht _0  (1.6 m)   Wt 121 lb (54.9 kg)   SpO2 96%   BMI 21.43 kg/m     Wt Readings from Last 3 Encounters:  05/26/22 121 lb (54.9 kg)  11/22/21 119 lb (54 kg)  11/18/21 115 lb (52.2 kg)     GEN:  Well nourished, well developed in no acute distress HEENT: Normal NECK: No JVD; No carotid bruits CARDIAC: RRR, no murmurs RESPIRATORY:  Clear to auscultation without rales, wheezing or rhonchi  ABDOMEN: Soft, non-tender, non-distended MUSCULOSKELETAL:  No edema, warm  SKIN: Warm and dry NEUROLOGIC:  Alert and oriented x 3 PSYCHIATRIC:  Normal affect   ASSESSMENT:    1. Coronary artery disease involving native coronary artery of native heart without angina pectoris   2. Chronic combined systolic and diastolic heart failure ([place])   3. Hyperlipidemia LDL goal <70   4.  Medication management   5. HFmrEF (heart failure with mildly reduced ejection fraction) ([place])   6. Essential hypertension   7. Coronary artery disease of native artery of native heart with stable angina pectoris ([place])   8. LBBB (left bundle branch block)     PLAN:    In order of problems listed above:  #NSTEMI: #Multivessel CAD: Patient hospitalized 04/2020 for NSTEMI found to have multivessel CAD s/p successful PCI to RCA and D1 on 05/08/20. Repeat cath 11/2020 demonstrated patent stents in the diagonal and RCA.  She does have moderate disease in LAD, mid to distal RCA and high-grade stenosis in a small AV groove circumflex.  She has been managed medically.  She is currently doing well without anginal symptoms.  -Continue clopidogrel 75 mg daily -Continue bisoprolol 2.5 mg  daily -Intolerant to statins -Did not tolerate ARB, imdur, metop   #Chronic Combined Systolic and Diastolic HF: EF dropped from 50-55% to 25-30% in 11/2020 but now improved back to 40-45% on TTE in 04/2021. Cath in 11/2020 with patent RCA and D1 stents, moderate disease in LAD, mid to distal RCA and high-grade stenosis in a small AV groove circumflex. Soft blood pressure and medication intolerances have limited GDMT. Currently euvolemic on exam. Dry Wt: 116lbs. -Continue bisoprolol 2.5 mg daily -Continue spironolactone 12.5 mg daily -Continue furosemide 40 mg daily -Not on ARB as did not tolerate -Declined SGLT2i -Low Na diet  #Hypertension: Well controlled and at goal. No hypotension. -Continue bisoprolol 2.5 mg daily -Continue spironolactone 12.5 mg daily   #HLD: #Statin Intolerance: Did not tolerate statins or zetia. Has difficulty with medications due to may intolerances. She would like to continue with lifestyle modifications. -Check lipid profile for monitoring -Could consider bempedoic acid if patient amenable but she has had significant intolerances in the past  #LBBB: Stable. -Will continue monitoring echoes   #Depression: -Follow-up with PCP  Follow-up:  6 months.  Medication Adjustments/Labs and Tests Ordered: Current medicines are reviewed at length with the patient today.  Concerns regarding medicines are outlined above.   Orders Placed This Encounter  Procedures   EKG 12-Lead   Meds ordered this encounter  Medications   furosemide (LASIX) 20 MG tablet    Sig: Take 1 tablet (20 mg total) by mouth daily.    Dispense:  90 tablet    Refill:  3    Dose decrease   pantoprazole (PROTONIX) 40 MG tablet    Sig: Take 1 tablet (40 mg total) by mouth daily.    Dispense:  90 tablet    Refill:  3   spironolactone (ALDACTONE) 25 MG tablet    Sig: Take 0.5 tablets (12.5 mg total) by mouth daily.    Dispense:  45 tablet    Refill:  3   Patient Instructions   Medication Instructions:   Your physician recommends that you continue on your current medications as directed. Please refer to the Current Medication list given to you today.  *If you need a refill on your cardiac medications before your next appointment, please call your pharmacy*    Follow-Up: At [facility], you and your health needs are our priority.  As part of our continuing mission to provide you with exceptional heart care, we have created designated Provider Care Teams.  These Care Teams include your primary Cardiologist (physician) and Advanced Practice Providers (APPs -  Physician Assistants and Nurse Practitioners) who all work together to provide you with the care you need, when you need  it.  We recommend signing up for the patient portal called "MyChart".  Sign up information is provided on this After Visit Summary.  MyChart is used to connect with patients for Virtual Visits (Telemedicine).  Patients are able to view lab/test results, encounter notes, upcoming appointments, etc.  Non-urgent messages can be sent to your provider as well.   To learn more about what you can do with MyChart, go to [email or web address].    Your next appointment:   6 month(s)  The format for your next appointment:   In Person  Provider:   Freada Bergeron, MD     Important Information About Sugar        I,Mathew Stumpf,acting as a scribe for Freada Bergeron, MD.,have documented all relevant documentation on the behalf of Freada Bergeron, MD,as directed by  Freada Bergeron, MD while in the presence of Freada Bergeron, MD.  I, Freada Bergeron, MD, have reviewed all documentation for this visit. The documentation on 05/26/22 for the exam, diagnosis, procedures, and orders are all accurate and complete.    Signed, Freada Bergeron, MD  05/26/2022 12:02 PM    [place]

## 2022-06-02 DIAGNOSIS — R54 Age-related physical debility: Secondary | ICD-10-CM | POA: Diagnosis not present

## 2022-06-02 DIAGNOSIS — M9902 Segmental and somatic dysfunction of thoracic region: Secondary | ICD-10-CM | POA: Diagnosis not present

## 2022-06-02 DIAGNOSIS — M542 Cervicalgia: Secondary | ICD-10-CM | POA: Diagnosis not present

## 2022-06-02 DIAGNOSIS — H409 Unspecified glaucoma: Secondary | ICD-10-CM | POA: Diagnosis not present

## 2022-06-02 DIAGNOSIS — I255 Ischemic cardiomyopathy: Secondary | ICD-10-CM | POA: Diagnosis not present

## 2022-06-02 DIAGNOSIS — M9903 Segmental and somatic dysfunction of lumbar region: Secondary | ICD-10-CM | POA: Diagnosis not present

## 2022-06-02 DIAGNOSIS — I252 Old myocardial infarction: Secondary | ICD-10-CM | POA: Diagnosis not present

## 2022-06-02 DIAGNOSIS — I5042 Chronic combined systolic (congestive) and diastolic (congestive) heart failure: Secondary | ICD-10-CM | POA: Diagnosis not present

## 2022-06-02 DIAGNOSIS — I2510 Atherosclerotic heart disease of native coronary artery without angina pectoris: Secondary | ICD-10-CM | POA: Diagnosis not present

## 2022-06-02 DIAGNOSIS — M9901 Segmental and somatic dysfunction of cervical region: Secondary | ICD-10-CM | POA: Diagnosis not present

## 2022-06-02 DIAGNOSIS — M9900 Segmental and somatic dysfunction of head region: Secondary | ICD-10-CM | POA: Diagnosis not present

## 2022-06-02 DIAGNOSIS — D649 Anemia, unspecified: Secondary | ICD-10-CM | POA: Diagnosis not present

## 2022-06-02 DIAGNOSIS — I110 Hypertensive heart disease with heart failure: Secondary | ICD-10-CM | POA: Diagnosis not present

## 2022-06-02 DIAGNOSIS — M62830 Muscle spasm of back: Secondary | ICD-10-CM | POA: Diagnosis not present

## 2022-06-03 DIAGNOSIS — M542 Cervicalgia: Secondary | ICD-10-CM | POA: Diagnosis not present

## 2022-06-03 DIAGNOSIS — M9903 Segmental and somatic dysfunction of lumbar region: Secondary | ICD-10-CM | POA: Diagnosis not present

## 2022-06-03 DIAGNOSIS — M9902 Segmental and somatic dysfunction of thoracic region: Secondary | ICD-10-CM | POA: Diagnosis not present

## 2022-06-03 DIAGNOSIS — M9901 Segmental and somatic dysfunction of cervical region: Secondary | ICD-10-CM | POA: Diagnosis not present

## 2022-06-03 DIAGNOSIS — M9900 Segmental and somatic dysfunction of head region: Secondary | ICD-10-CM | POA: Diagnosis not present

## 2022-06-03 DIAGNOSIS — M62830 Muscle spasm of back: Secondary | ICD-10-CM | POA: Diagnosis not present

## 2022-06-09 DIAGNOSIS — M9902 Segmental and somatic dysfunction of thoracic region: Secondary | ICD-10-CM | POA: Diagnosis not present

## 2022-06-09 DIAGNOSIS — M542 Cervicalgia: Secondary | ICD-10-CM | POA: Diagnosis not present

## 2022-06-09 DIAGNOSIS — M9901 Segmental and somatic dysfunction of cervical region: Secondary | ICD-10-CM | POA: Diagnosis not present

## 2022-06-09 DIAGNOSIS — M62830 Muscle spasm of back: Secondary | ICD-10-CM | POA: Diagnosis not present

## 2022-06-09 DIAGNOSIS — M9903 Segmental and somatic dysfunction of lumbar region: Secondary | ICD-10-CM | POA: Diagnosis not present

## 2022-06-09 DIAGNOSIS — M9900 Segmental and somatic dysfunction of head region: Secondary | ICD-10-CM | POA: Diagnosis not present

## 2022-06-16 DIAGNOSIS — M9902 Segmental and somatic dysfunction of thoracic region: Secondary | ICD-10-CM | POA: Diagnosis not present

## 2022-06-16 DIAGNOSIS — M62830 Muscle spasm of back: Secondary | ICD-10-CM | POA: Diagnosis not present

## 2022-06-16 DIAGNOSIS — M9900 Segmental and somatic dysfunction of head region: Secondary | ICD-10-CM | POA: Diagnosis not present

## 2022-06-16 DIAGNOSIS — M542 Cervicalgia: Secondary | ICD-10-CM | POA: Diagnosis not present

## 2022-06-16 DIAGNOSIS — M9903 Segmental and somatic dysfunction of lumbar region: Secondary | ICD-10-CM | POA: Diagnosis not present

## 2022-06-16 DIAGNOSIS — M9901 Segmental and somatic dysfunction of cervical region: Secondary | ICD-10-CM | POA: Diagnosis not present

## 2022-06-17 DIAGNOSIS — M542 Cervicalgia: Secondary | ICD-10-CM | POA: Diagnosis not present

## 2022-06-17 DIAGNOSIS — M9900 Segmental and somatic dysfunction of head region: Secondary | ICD-10-CM | POA: Diagnosis not present

## 2022-06-17 DIAGNOSIS — M62830 Muscle spasm of back: Secondary | ICD-10-CM | POA: Diagnosis not present

## 2022-06-17 DIAGNOSIS — M9903 Segmental and somatic dysfunction of lumbar region: Secondary | ICD-10-CM | POA: Diagnosis not present

## 2022-06-17 DIAGNOSIS — M9902 Segmental and somatic dysfunction of thoracic region: Secondary | ICD-10-CM | POA: Diagnosis not present

## 2022-06-17 DIAGNOSIS — M9901 Segmental and somatic dysfunction of cervical region: Secondary | ICD-10-CM | POA: Diagnosis not present

## 2022-06-20 DIAGNOSIS — H409 Unspecified glaucoma: Secondary | ICD-10-CM | POA: Diagnosis not present

## 2022-06-20 DIAGNOSIS — I110 Hypertensive heart disease with heart failure: Secondary | ICD-10-CM | POA: Diagnosis not present

## 2022-06-20 DIAGNOSIS — I2510 Atherosclerotic heart disease of native coronary artery without angina pectoris: Secondary | ICD-10-CM | POA: Diagnosis not present

## 2022-06-20 DIAGNOSIS — I252 Old myocardial infarction: Secondary | ICD-10-CM | POA: Diagnosis not present

## 2022-06-20 DIAGNOSIS — D649 Anemia, unspecified: Secondary | ICD-10-CM | POA: Diagnosis not present

## 2022-06-20 DIAGNOSIS — I5042 Chronic combined systolic (congestive) and diastolic (congestive) heart failure: Secondary | ICD-10-CM | POA: Diagnosis not present

## 2022-06-20 DIAGNOSIS — R54 Age-related physical debility: Secondary | ICD-10-CM | POA: Diagnosis not present

## 2022-06-20 DIAGNOSIS — I255 Ischemic cardiomyopathy: Secondary | ICD-10-CM | POA: Diagnosis not present

## 2022-06-24 DIAGNOSIS — M9900 Segmental and somatic dysfunction of head region: Secondary | ICD-10-CM | POA: Diagnosis not present

## 2022-06-24 DIAGNOSIS — M542 Cervicalgia: Secondary | ICD-10-CM | POA: Diagnosis not present

## 2022-06-24 DIAGNOSIS — M9902 Segmental and somatic dysfunction of thoracic region: Secondary | ICD-10-CM | POA: Diagnosis not present

## 2022-06-24 DIAGNOSIS — M62830 Muscle spasm of back: Secondary | ICD-10-CM | POA: Diagnosis not present

## 2022-06-24 DIAGNOSIS — M9901 Segmental and somatic dysfunction of cervical region: Secondary | ICD-10-CM | POA: Diagnosis not present

## 2022-06-24 DIAGNOSIS — M9903 Segmental and somatic dysfunction of lumbar region: Secondary | ICD-10-CM | POA: Diagnosis not present

## 2022-06-26 IMAGING — DX DG CHEST 1V PORT
1 series · 1 of 1 positions shown · non-contrast
Comparison: None.

CLINICAL DATA: Hip fracture, preop evaluation

EXAM:
PORTABLE CHEST 1 VIEW

[chest ap]
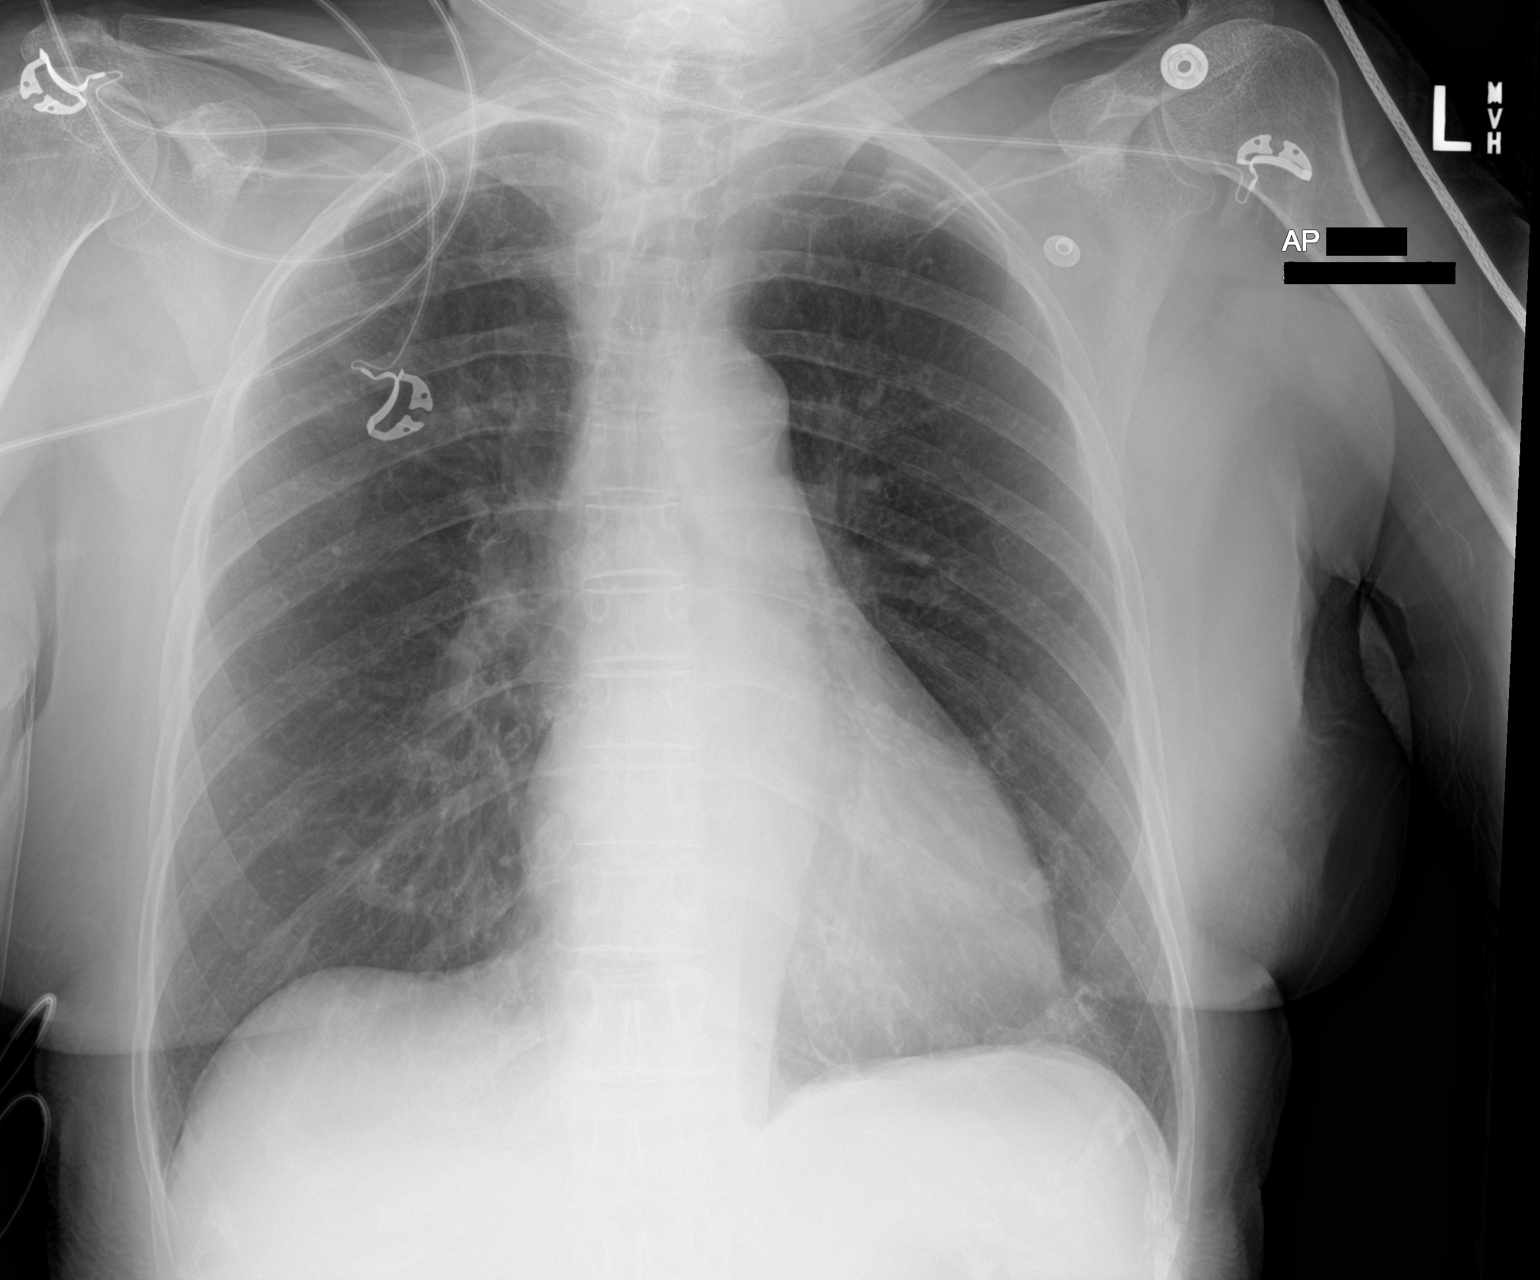

[1 of 1 positions shown; findings below may reference images not displayed]

FINDINGS: The heart size and mediastinal contours are within normal limits.
Slight, likely chronic interstitial prominence. No pleural effusion.
The visualized skeletal structures are unremarkable.
IMPRESSION: No acute process in the chest.

## 2022-06-28 IMAGING — DX DG PORTABLE PELVIS
1 series · 1 of 1 positions shown · non-contrast
Comparison: Intraoperative films from earlier in the same day.

CLINICAL DATA: Status post right hip replacement

EXAM:
PORTABLE PELVIS 1-2 VIEWS

[pelvis ap]
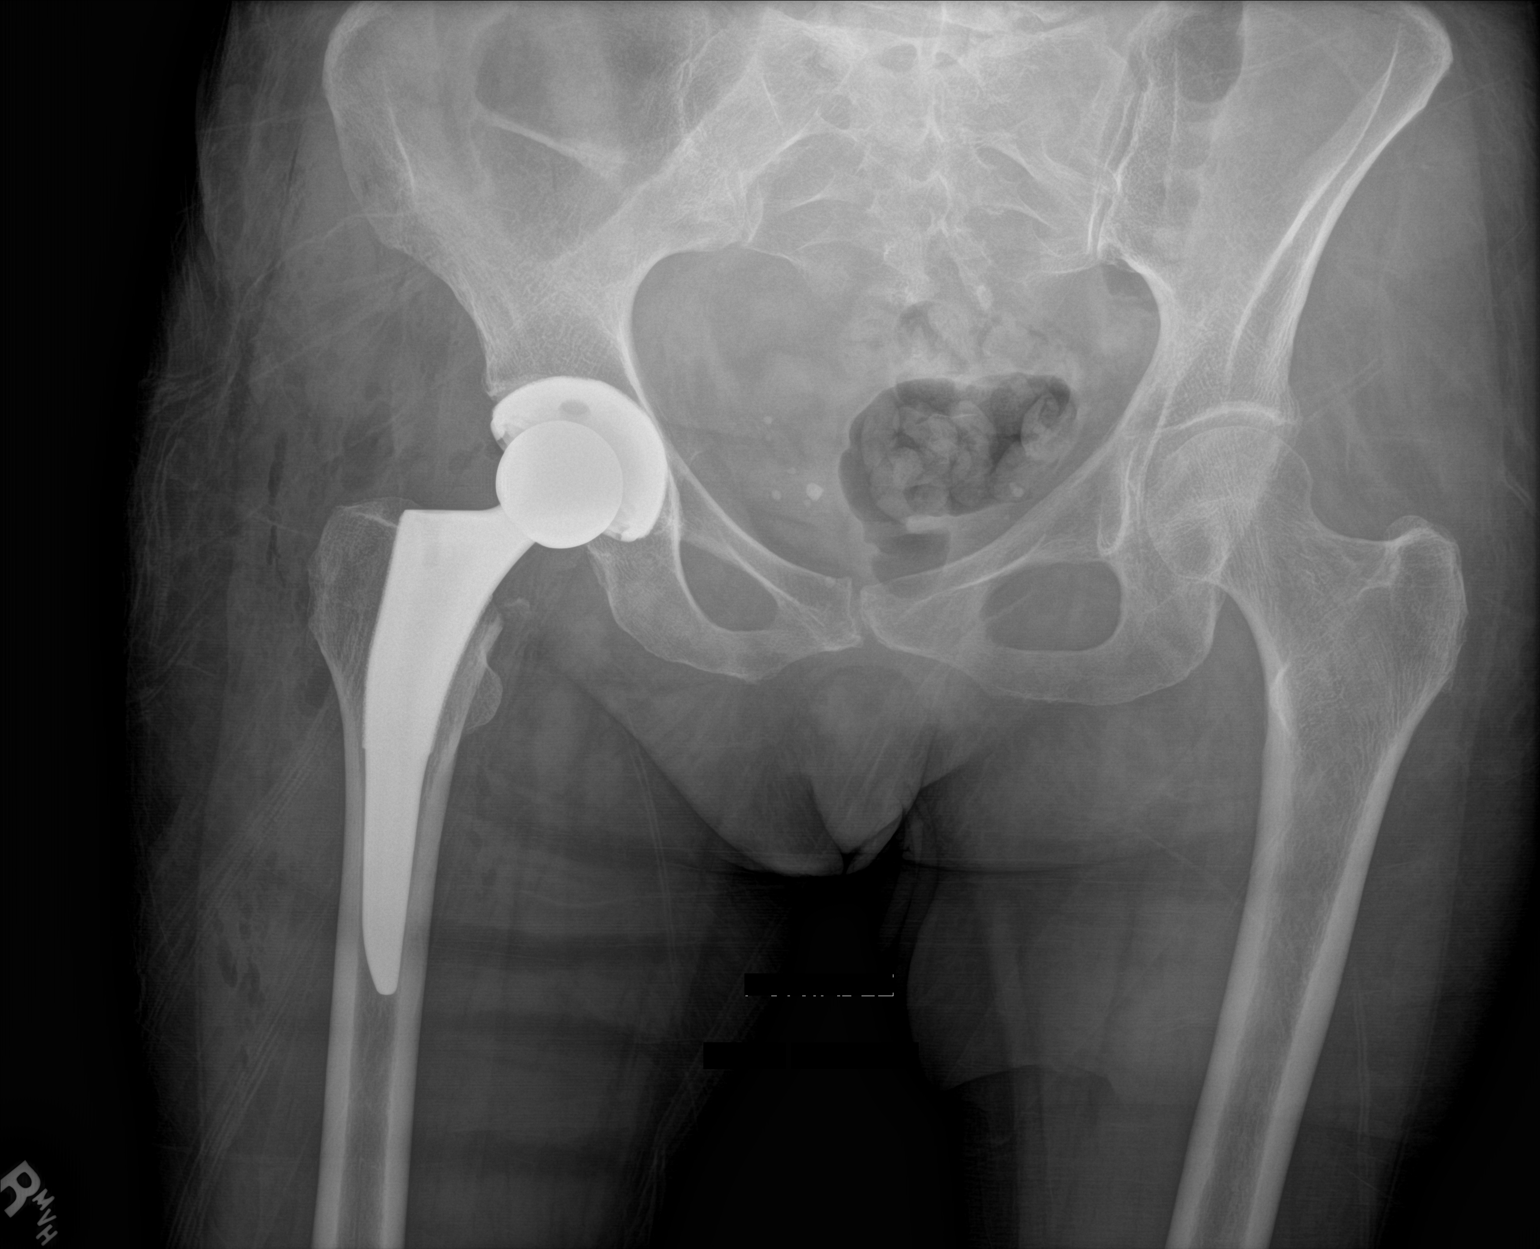

[1 of 1 positions shown; findings below may reference images not displayed]

FINDINGS: Right hip prosthesis is noted in satisfactory position. No acute
bony or soft tissue abnormality is noted.
IMPRESSION: Status post right hip replacement

## 2022-07-07 DIAGNOSIS — M25562 Pain in left knee: Secondary | ICD-10-CM | POA: Diagnosis not present

## 2022-07-07 DIAGNOSIS — M1712 Unilateral primary osteoarthritis, left knee: Secondary | ICD-10-CM | POA: Diagnosis not present

## 2022-07-07 DIAGNOSIS — Z96649 Presence of unspecified artificial hip joint: Secondary | ICD-10-CM | POA: Diagnosis not present

## 2022-07-07 DIAGNOSIS — M545 Low back pain, unspecified: Secondary | ICD-10-CM | POA: Diagnosis not present

## 2022-07-07 DIAGNOSIS — Z96641 Presence of right artificial hip joint: Secondary | ICD-10-CM | POA: Diagnosis not present

## 2022-08-01 ENCOUNTER — Telehealth: Payer: Self-pay | Admitting: Cardiology

## 2022-08-01 NOTE — Progress Notes (Unsigned)
Cardiology Office Note:    Date:  08/04/2022   ID:  Patricia Garcia, DOB 11/19/1932, MRN [id number]  PCP:  Darrol Jump, NP   [place] Prov[facility] Cardiologist:  Freada Bergeron, MD Cardiology APP:  Sharmon Revere     Referring MD: Darrol Jump, NP   Chief Complaint  Patient presents with   left arm pain    History of Present Illness:    Patricia Garcia is a 89 y.o. female seen as a work in for evaluation of chest pain. Followed by Dr Johney Frame- last seen in November.   The patient suffered a mechanical fall resulting in hip fracture requiring surgery on 05/02/20.  She was readmitted to [facility] 05/06/20 with nausea, vomiting, and hypoxia.  She was ultimately found to have a NSTEMI (trops in 3k range) and acute on chronic diastolic heart failure.  She was treated with IV Lasix.  TTE 05/07/2020 showed hypokinesis of the basal inferior and inferolateral wall, EF 50 to 51%, grade 2 diastolic dysfunction, elevated left atrial pressure, mild mitral regurgitation.  She underwent cardiac cath 05/08/2020 showing three-vessel obstructive coronary artery disease as outlined below ultimately received DES to mid RCA and first diagonal.  Hospitalization was also notable for anemia requiring blood transfusion, hypokalemia, gait instability, hypokalemia, hypoalbuminemia, mild AKI, and nocturnal confusion.   She was seen in  ED 11/2020 with nausea, vomiting, shortness of breath. Chest x-ray with small bilateral pleural effusions.  BNP elevated 1701.  Troponin negative x2.  She was admitted for CHF exacerbation.  Echo 12/07/2020 with LVEF 25-30%, mildly reduced RV systolic function.  Right and left heart cath 12/10/2020 with patent prior placement in RCA and first diagonal and otherwise stable CAD, mildly elevated right heart pressures.  She was diuresed from 123 to 118 pounds.   She was seen in the ED 03/13/21 with chest pain.  Her hs-Trops were neg (13>>13).  CXR was  unremarkable.  EKG showed old LBBB.  Labs: Hgb 13.2, BNP 398, SCr 0.95, K+4.1, ALP 122, ALT 19.  She was DC home and saw Richardson Dopp in follow-up. She was started on imdur at that time, but later stopped it because she stated she could not tolerate it. Repeat TTE 04/2021 showed improvement of LVEF 40-45%. When seen in November by Dr Johney Frame she was doing well from a CV standpoint. She does have intolerance to statins.   She reports that this past week she was prescribed Nexium for acid reflux. After taking it she had a rash the next day. This seemed to get better and she took another dose that evening. She awoke at 3 am feeling jittery, hot, flushed and had HA. Felt itching in back and arms. Developed pain in left arm without chest pain. Had never felt this way before. EMS called. Ecg was stable. Took Benadryl and symptoms eased off. Had some belching. This am noted some discomfort in left leg and foot and it felt heavy. This has also resolved. Reports labs done by [facility].   Past Medical History:  Diagnosis Date   Anal or rectal pain    Anemia    Arthritis    CAD (coronary artery disease)    a. NSTEMI 04/2020 s/p DES to [facility] and D1.   Depression    Diverticulosis of colon (without mention of hemorrhage)    HFrEF (heart failure with reduced ejection fraction) ([place])    Echo 5/22: EF 25-30 // Echocardiogram 10/22: EF 40-45, mild LVH, Gr  1 DD, inf and inf-sept HK, normal RVSF, mild LAE, mild MR   Hypertension    Hypoalbuminemia    Internal hemorrhoids without mention of complication    Ischemic heart disease    LBBB (left bundle branch block)    Osteopenia    Renal insufficiency    S/P right hip fracture    Slow transit constipation     Past Surgical History:  Procedure Laterality Date   ABDOMINAL HYSTERECTOMY     CARDIAC CATHETERIZATION     CATARACT EXTRACTION, BILATERAL     CORONARY STENT INTERVENTION N/A 05/08/2020   Procedure: CORONARY STENT INTERVENTION;  Surgeon: Martinique,  Avelynn Sellin M, MD;  Location: [place] [facility];  Service: Cardiovascular;  Laterality: N/A;   LEFT HEART CATH AND CORONARY ANGIOGRAPHY N/A 05/08/2020   Procedure: LEFT HEART CATH AND CORONARY ANGIOGRAPHY;  Surgeon: Martinique, Addalyne Vandehei M, MD;  Location: [place] [facility];  Service: Cardiovascular;  Laterality: N/A;   MASS EXCISION  06/10/2011   Procedure: EXCISION MASS;  Surgeon: Wynonia Sours, MD;  Location: [place];  Service: Orthopedics;  Laterality: Right;  excision cyst right thumb IP, debridement IP right thumb   RIGHT/LEFT HEART CATH AND CORONARY ANGIOGRAPHY N/A 12/10/2020   Procedure: RIGHT/LEFT HEART CATH AND CORONARY ANGIOGRAPHY;  Surgeon: [place] Sine, MD;  Location: [place] [facility];  Service: Cardiovascular;  Laterality: N/A;   TONSILLECTOMY     TOTAL HIP ARTHROPLASTY Right 05/02/2020   Procedure: TOTAL HIP ARTHROPLASTY ANTERIOR APPROACH;  Surgeon: Rod Can, MD;  Location: [facility];  Service: Orthopedics;  Laterality: Right;    Current Medications: No outpatient medications have been marked as taking for the 08/04/22 encounter (Office Visit) with Martinique, Lunabelle Oatley M, MD.     Allergies:   Imdur [isosorbide nitrate], Alendronate sodium, Doxycycline, Eucalyptus oil, Linaclotide, Medrol [methylprednisolone], Penicillamine, Penicillins, Prednisone, Risedronate, Statins, Statins support [a-g pro], Statins support [acid blockers support], Sulfa antibiotics, Sulfonamide derivatives, Tramadol, Latex, and Tape   Social History   Socioeconomic History   Marital status: Widowed    Spouse name: Not on file   Number of children: 2   Years of education: 12   Highest education level: High school graduate  Occupational History   Occupation: Retired    Fish farm manager: RETIRED  Tobacco Use   Smoking status: Former    Types: Cigarettes    Quit date: 06/05/1984    Years since quitting: 38.1   Smokeless tobacco: Never  Vaping Use   Vaping Use: Never used  Substance and Sexual  Activity   Alcohol use: No   Drug use: No   Sexual activity: Not on file  Other Topics Concern   Not on file  Social History Narrative   Son mark local    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   Social Determinants of Health   Financial Resource Strain: [place]  (08/06/2021)   Overall Financial Resource Strain (CARDIA)    Difficulty of Paying Living Expenses: Not hard at all  Food Insecurity: Unknown (08/06/2021)   Hunger Vital Sign    Worried About Running Out of Food in the Last Year: Never true    [place] in the Last Year: Not on file  Transportation Needs: No Transportation Needs (08/06/2021)   PRAPARE - Hydrologist (Medical): No    Lack of Transportation (Non-Medical): No  Physical Activity: Insufficiently Active (10/25/2020)   Exercise  Vital Sign    Days of Exercise per Week: 2 days    Minutes of Exercise per Session: 20 min  Stress: No Stress Concern Present (08/06/2021)   [place]    Feeling of Stress : Only a little  Social Connections: Moderately Integrated (10/25/2020)   Social Connection and Isolation Panel [NHANES]    Frequency of Communication with Friends and Family: More than three times a week    Frequency of Social Gatherings with Friends and Family: More than three times a week    Attends Religious Services: 1 to 4 times per year    Active Member of [employer] or Organizations: Yes    Attends Archivist Meetings: 1 to 4 times per year    Marital Status: Widowed     Family History: The patient's family history includes Cancer in her father; Colon polyps in her mother; Heart disease in her father.  ROS:   Please see the history of present illness.     All other systems reviewed and are negative.  EKGs/Labs/Other Studies Reviewed:    The following studies were reviewed today:  Cardiac cath 12/10/20:   RIGHT/LEFT HEART CATH AND CORONARY ANGIOGRAPHY   Conclusion    Prox Cx to Mid Cx lesion is 85% stenosed. Ramus lesion is 30% stenosed. Previously placed 1st Diag stent (unknown type) is widely patent. Mid LAD to Dist LAD lesion is 60% stenosed. Mid LAD lesion is 40% stenosed. Dist RCA lesion is 60% stenosed. Mid RCA lesion is 30% stenosed. Previously placed Prox RCA stent (unknown type) is widely patent.   Widely patent stents in the high diagonal and proximal RCA inserted in October 2021.   The LAD has tortuosity in its mid segment with 40 and 60% narrowing.  This is not significantly changed from the prior angiogram.   Mild disease in the ramus immediate vessel.   Previously noted very small AV groove circumflex with diffuse narrowing of 80%.   Dominant RCA with patent proximal stent and mild 30% mid stenosis with 50 to 60% narrowing before the PDA takeoff.   Mild right heart pressure elevation pulmonary hypertension.   RECOMMENDATION: Guideline directed medical therapy for heart failure with reduced EF on recent echo at 25 to 30%.  Medical therapy for concomitant CAD.  Diagnostic Dominance: Right  Intervention  Echo 05/15/21: IMPRESSIONS     1. Left ventricular ejection fraction, by estimation, is 45 to 50%. The  left ventricle has mildly decreased function. The left ventricle  demonstrates global hypokinesis with septal-lateral dyssynchrony  consistent with LBBB. Left ventricular diastolic  parameters are consistent with Grade I diastolic dysfunction (impaired  relaxation).   2. Right ventricular systolic function is normal. The right ventricular  size is normal. There is normal pulmonary artery systolic pressure. The  estimated right ventricular systolic pressure is 01.7 mmHg.   3. The mitral valve is normal in structure. Mild mitral valve  regurgitation. No evidence of mitral stenosis.   4. The aortic valve is tricuspid. There is mild calcification of the   aortic valve. Aortic valve regurgitation is not visualized. No aortic  stenosis is present.   5. The inferior vena cava is normal in size with greater than 50%  respiratory variability, suggesting right atrial pressure of 3 mmHg.   EKG:  EKG is  ordered today.  The ekg ordered today demonstrates NSR rate 71. LAD, LBBB. I have personally reviewed and interpreted this study.   Recent  Labs: No results found for requested labs within last 365 days.  Recent Lipid Panel    Component Value Date/Time   CHOL 251 (H) 11/26/2021 0916   TRIG 203 (H) 11/26/2021 0916   HDL 42 11/26/2021 0916   CHOLHDL 6.0 (H) 11/26/2021 0916   CHOLHDL 4.5 12/10/2020 0511   VLDL 29 12/10/2020 0511   LDLCALC 171 (H) 11/26/2021 0916     Risk Assessment/Calculations:       Physical Exam:    VS:  BP 134/68   Pulse 71   Ht '5\' 3"'$  (1.6 m)   Wt 123 lb (55.8 kg)   SpO2 98%   BMI 21.79 kg/m     Wt Readings from Last 3 Encounters:  08/04/22 123 lb (55.8 kg)  05/26/22 121 lb (54.9 kg)  11/22/21 119 lb (54 kg)     GEN:  Well nourished, well developed in no acute distress HEENT: Normal NECK: No JVD; No carotid bruits LYMPHATICS: No lymphadenopathy CARDIAC: RRR, no murmurs, rubs, gallops RESPIRATORY:  Clear to auscultation without rales, wheezing or rhonchi  ABDOMEN: Soft, non-tender, non-distended MUSCULOSKELETAL:  No edema; No deformity  SKIN: Warm and dry NEUROLOGIC:  Alert and oriented x 3 PSYCHIATRIC:  Normal affect   ASSESSMENT:    1. LBBB (left bundle branch block)   2. Chronic combined systolic and diastolic heart failure ([place])   3. Coronary artery disease of native artery of native heart with stable angina pectoris (HCC)    PLAN:    In order of problems listed above:  Symptoms suggesting allergic reaction to Nexium with flushing, rash, HA. Left arm pain. All improved with Benadryl. Doubt left arm pain is cardiac. Relieved with belching. Ecg shows stable LBBB. Exam is clear. Reassured  patient concerning these findings. No further cardiac evaluation needed. Will keep follow up with Dr Johney Frame as scheduled.            Medication Adjustments/Labs and Tests Ordered: Current medicines are reviewed at length with the patient today.  Concerns regarding medicines are outlined above.  Orders Placed This Encounter  Procedures   EKG 12-Lead   No orders of the defined types were placed in this encounter.   Patient Instructions  Medication Instructions:  Continue same medications *If you need a refill on your cardiac medications before your next appointment, please call your pharmacy*   Lab Work: None ordered   Testing/Procedures: None ordered   Follow-Up: At [facility], you and your health needs are our priority.  As part of our continuing mission to provide you with exceptional heart care, we have created designated Provider Care Teams.  These Care Teams include your primary Cardiologist (physician) and Advanced Practice Providers (APPs -  Physician Assistants and Nurse Practitioners) who all work together to provide you with the care you need, when you need it.  We recommend signing up for the patient portal called "MyChart".  Sign up information is provided on this After Visit Summary.  MyChart is used to connect with patients for Virtual Visits (Telemedicine).  Patients are able to view lab/test results, encounter notes, upcoming appointments, etc.  Non-urgent messages can be sent to your provider as well.   To learn more about what you can do with MyChart, go to [email or web address].    Your next appointment:  Schedule follow up appointment    Provider:  Dr.Pemberton     Signed, Alonzo Owczarzak Martinique, MD  08/04/2022 12:03 PM    [place]

## 2022-08-01 NOTE — Telephone Encounter (Signed)
Pt c/o of Chest Pain: STAT if CP now or developed within 24 hours  1. Are you having CP right now? yes  2. Are you experiencing any other symptoms (ex. SOB, nausea, vomiting, sweating)? Has pain in left arm when has chest pain.   3. How long have you been experiencing CP? Has been having chest pain for most of week.   4. Is your CP continuous or coming and going? States right not it's continuous.   5. Have you taken Nitroglycerin? No  ?

## 2022-08-01 NOTE — Telephone Encounter (Signed)
Call sent straight to triage. Patient is complaining of chest pain that is continuous right now. Patient stated she does not think it's a heart attack. Informed patient that she needs to go to the ED to get evaluated. Patient refused to go to ED. Patient stated she just needs to get in to see someone. There is no available times today or Monday at our location. Offered patient an appointment at our sister office, [place]. Patient stated she does not mind seeing the DOD at [place] on Monday. Scheduled patient with Dr. Martinique. Encouraged patient to still go to ED, especially with her hx of CAD. Patient stated if it get worse she will go. Will forward to Dr. Johney Frame for further advisement.

## 2022-08-04 ENCOUNTER — Encounter: Payer: Self-pay | Admitting: Cardiology

## 2022-08-04 ENCOUNTER — Ambulatory Visit: Payer: Medicare Other | Attending: Cardiology | Admitting: Cardiology

## 2022-08-04 VITALS — BP 134/68 | HR 71 | Ht 63.0 in | Wt 123.0 lb

## 2022-08-04 DIAGNOSIS — I447 Left bundle-branch block, unspecified: Secondary | ICD-10-CM | POA: Diagnosis not present

## 2022-08-04 DIAGNOSIS — I25118 Atherosclerotic heart disease of native coronary artery with other forms of angina pectoris: Secondary | ICD-10-CM | POA: Insufficient documentation

## 2022-08-04 DIAGNOSIS — I5042 Chronic combined systolic (congestive) and diastolic (congestive) heart failure: Secondary | ICD-10-CM

## 2022-08-04 NOTE — Patient Instructions (Signed)
Medication Instructions:  Continue same medications *If you need a refill on your cardiac medications before your next appointment, please call your pharmacy*   Lab Work: None ordered   Testing/Procedures: None ordered   Follow-Up: At [facility], you and your health needs are our priority.  As part of our continuing mission to provide you with exceptional heart care, we have created designated Provider Care Teams.  These Care Teams include your primary Cardiologist (physician) and Advanced Practice Providers (APPs -  Physician Assistants and Nurse Practitioners) who all work together to provide you with the care you need, when you need it.  We recommend signing up for the patient portal called "MyChart".  Sign up information is provided on this After Visit Summary.  MyChart is used to connect with patients for Virtual Visits (Telemedicine).  Patients are able to view lab/test results, encounter notes, upcoming appointments, etc.  Non-urgent messages can be sent to your provider as well.   To learn more about what you can do with MyChart, go to [email or web address].    Your next appointment:  Schedule follow up appointment    Provider:  Dr.Pemberton

## 2023-01-15 ENCOUNTER — Other Ambulatory Visit: Payer: Self-pay

## 2023-01-15 DIAGNOSIS — Z79899 Other long term (current) drug therapy: Secondary | ICD-10-CM

## 2023-01-15 DIAGNOSIS — I2510 Atherosclerotic heart disease of native coronary artery without angina pectoris: Secondary | ICD-10-CM

## 2023-01-15 DIAGNOSIS — I5042 Chronic combined systolic (congestive) and diastolic (congestive) heart failure: Secondary | ICD-10-CM

## 2023-01-15 DIAGNOSIS — E785 Hyperlipidemia, unspecified: Secondary | ICD-10-CM

## 2023-01-15 MED ORDER — BISOPROLOL FUMARATE 5 MG PO TABS: 2.5000 mg | ORAL_TABLET | Freq: Every day | ORAL | 2 refills | Status: DC

## 2023-01-27 NOTE — Progress Notes (Signed)
Cardiology Office Note:    Date:  01/29/2023   ID:  Patricia Garcia, DOB Aug 12, 1932, MRN [id number]  PCP:  Nona Dell, NP   [facility] Providers Cardiologist:  Meriam Sprague, MD Cardiology APP:  Kennon Rounds     Referring MD: No ref. provider found   Chief Complaint  Patient presents with   Coronary Artery Disease    History of Present Illness:    Patricia Garcia is a 89 y.o. female seen as a work in for evaluation of chest pain. Followed by Dr Shari Prows- last seen in November.   The patient suffered a mechanical fall resulting in hip fracture requiring surgery on 05/02/20.  She was readmitted to [facility] 05/06/20 with nausea, vomiting, and hypoxia.  She was ultimately found to have a NSTEMI (trops in 3k range) and acute on chronic diastolic heart failure.  She was treated with IV Lasix.  TTE 05/07/2020 showed hypokinesis of the basal inferior and inferolateral wall, EF 50 to 55%, grade 2 diastolic dysfunction, elevated left atrial pressure, mild mitral regurgitation.  She underwent cardiac cath 05/08/2020 showing three-vessel obstructive coronary artery disease as outlined below ultimately received DES to mid RCA and first diagonal.  Hospitalization was also notable for anemia requiring blood transfusion, hypokalemia, gait instability, hypokalemia, hypoalbuminemia, mild AKI, and nocturnal confusion.   She was seen in  ED 11/2020 with nausea, vomiting, shortness of breath. Chest x-ray with small bilateral pleural effusions.  BNP elevated 1701.  Troponin negative x2.  She was admitted for CHF exacerbation.  Echo 12/07/2020 with LVEF 25-30%, mildly reduced RV systolic function.  Right and left heart cath 12/10/2020 with patent prior placement in RCA and first diagonal and otherwise stable CAD, mildly elevated right heart pressures.  She was diuresed from 123 to 118 pounds.   She was seen in the ED 03/13/21 with chest pain.  Her hs-Trops were neg (13>>13).   CXR was unremarkable.  EKG showed old LBBB.  Labs: Hgb 13.2, BNP 398, SCr 0.95, K+4.1, ALP 122, ALT 19.  She was DC home and saw Tereso Newcomer in follow-up. She was started on imdur at that time, but later stopped it because she stated she could not tolerate it. Repeat TTE 04/2021 showed improvement of LVEF 40-45%.   On follow up today she is doing very well. Denies any chest pain, dyspnea, palpitations, dizziness or edema. Weight is stable. She walks a lot and is independent.    Past Medical History:  Diagnosis Date   Anal or rectal pain    Anemia    Arthritis    CAD (coronary artery disease)    a. NSTEMI 04/2020 s/p DES to [facility] and D1.   Depression    Diverticulosis of colon (without mention of hemorrhage)    HFrEF (heart failure with reduced ejection fraction) (HCC)    Echo 5/22: EF 25-30 // Echocardiogram 10/22: EF 40-45, mild LVH, Gr 1 DD, inf and inf-sept HK, normal RVSF, mild LAE, mild MR   Hypertension    Hypoalbuminemia    Internal hemorrhoids without mention of complication    Ischemic heart disease    LBBB (left bundle branch block)    Osteopenia    Renal insufficiency    S/P right hip fracture    Slow transit constipation     Past Surgical History:  Procedure Laterality Date   ABDOMINAL HYSTERECTOMY     CARDIAC CATHETERIZATION     CATARACT EXTRACTION, BILATERAL  CORONARY STENT INTERVENTION N/A 05/08/2020   Procedure: CORONARY STENT INTERVENTION;  Surgeon: [place], Vuong Musa M, MD;  Location: [facility] INVASIVE CV LAB;  Service: Cardiovascular;  Laterality: N/A;   LEFT HEART CATH AND CORONARY ANGIOGRAPHY N/A 05/08/2020   Procedure: LEFT HEART CATH AND CORONARY ANGIOGRAPHY;  Surgeon: [place], Mireya Meditz M, MD;  Location: [facility] INVASIVE CV LAB;  Service: Cardiovascular;  Laterality: N/A;   MASS EXCISION  06/10/2011   Procedure: EXCISION MASS;  Surgeon: Nicki Reaper, MD;  Location: [facility];  Service: Orthopedics;  Laterality: Right;  excision cyst right thumb IP,  debridement IP right thumb   RIGHT/LEFT HEART CATH AND CORONARY ANGIOGRAPHY N/A 12/10/2020   Procedure: RIGHT/LEFT HEART CATH AND CORONARY ANGIOGRAPHY;  Surgeon: Lennette Bihari, MD;  Location: [facility] INVASIVE CV LAB;  Service: Cardiovascular;  Laterality: N/A;   TONSILLECTOMY     TOTAL HIP ARTHROPLASTY Right 05/02/2020   Procedure: TOTAL HIP ARTHROPLASTY ANTERIOR APPROACH;  Surgeon: Samson Frederic, MD;  Location: [facility] ORS;  Service: Orthopedics;  Laterality: Right;    Current Medications: Current Meds  Medication Sig   Acetaminophen (TYLENOL) 325 MG CAPS 2 tabs as needed Orally every 6 hrs   ALPRAZolam (XANAX) 0.25 MG tablet 1 tablet as needed Orally daily   bisoprolol (ZEBETA) 5 MG tablet Take 0.5 tablets (2.5 mg total) by mouth daily.   Calcium Carbonate-Vit D-Min (CALCIUM 1200 PO) Take 1,200 mg by mouth daily.   Cholecalciferol (VITAMIN D-3 PO) Take 1 capsule by mouth daily after supper. Per Patient taking 1,000 units daily   clopidogrel (PLAVIX) 75 MG tablet Take 1 tablet (75 mg total) by mouth daily.   diclofenac Sodium (VOLTAREN) 1 % GEL Apply topically as needed. Taking 3 to 5 days   docusate sodium (COLACE) 100 MG capsule Take 100 mg by mouth in the morning.   furosemide (LASIX) 20 MG tablet Take 1 tablet (20 mg total) by mouth daily.   latanoprost (XALATAN) 0.005 % ophthalmic solution Place 1 drop into both eyes at bedtime.   Multiple Vitamins-Minerals (MULTIVITAMIN ADULTS 50+) TABS Take 1 tablet by mouth daily with breakfast.   Omega-3 Fatty Acids (FISH OIL BURP-LESS) 1200 MG CAPS 1 capsule Orally Twice a day   pantoprazole (PROTONIX) 40 MG tablet Take 1 tablet (40 mg total) by mouth daily.   Polyethyl Glycol-Propyl Glycol (SYSTANE) 0.4-0.3 % SOLN as directed Ophthalmic   spironolactone (ALDACTONE) 25 MG tablet Take 0.5 tablets (12.5 mg total) by mouth daily.     Allergies:   Imdur [isosorbide nitrate], Alendronate sodium, Doxycycline, Eucalyptus oil, Linaclotide, Medrol  [methylprednisolone], Penicillamine, Penicillins, Prednisone, Risedronate, Statins, Statins support [a-g pro], Statins support [acid blockers support], Sulfa antibiotics, Sulfonamide derivatives, Tramadol, Latex, and Tape   Social History   Socioeconomic History   Marital status: Widowed    Spouse name: Not on file   Number of children: 2   Years of education: 12   Highest education level: High school graduate  Occupational History   Occupation: Retired    Associate Professor: RETIRED  Tobacco Use   Smoking status: Former    Current packs/day: 0.00    Types: Cigarettes    Quit date: 06/05/1984    Years since quitting: 38.6   Smokeless tobacco: Never  Vaping Use   Vaping status: Never Used  Substance and Sexual Activity   Alcohol use: No   Drug use: No   Sexual activity: Not on file  Other Topics Concern   Not on file  Social History Narrative  Son mark local    Daughter out of town in [place]   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   Social Determinants of Health   Financial Resource Strain: Low Risk  (08/06/2021)   Overall Financial Resource Strain (CARDIA)    Difficulty of Paying Living Expenses: Not hard at all  Food Insecurity: Unknown (08/06/2021)   Hunger Vital Sign    Worried About Running Out of Food in the Last Year: Never true    Ran Out of Food in the Last Year: Not on file  Transportation Needs: No Transportation Needs (08/06/2021)   PRAPARE - Administrator, Civil Service (Medical): No    Lack of Transportation (Non-Medical): No  Physical Activity: Insufficiently Active (10/25/2020)   Exercise Vital Sign    Days of Exercise per Week: 2 days    Minutes of Exercise per Session: 20 min  Stress: No Stress Concern Present (08/06/2021)   [employer] of Occupational Health - Occupational Stress Questionnaire    Feeling of Stress : Only a little  Social Connections: Moderately Integrated (10/25/2020)   Social Connection and Isolation Panel [NHANES]     Frequency of Communication with Friends and Family: More than three times a week    Frequency of Social Gatherings with Friends and Family: More than three times a week    Attends Religious Services: 1 to 4 times per year    Active Member of [employer] or Organizations: Yes    Attends Banker Meetings: 1 to 4 times per year    Marital Status: Widowed     Family History: The patient's family history includes Cancer in her father; Colon polyps in her mother; Heart disease in her father.  ROS:   Please see the history of present illness.     All other systems reviewed and are negative.  EKGs/Labs/Other Studies Reviewed:    The following studies were reviewed today:  Cardiac cath 12/10/20:  RIGHT/LEFT HEART CATH AND CORONARY ANGIOGRAPHY   Conclusion    Prox Cx to Mid Cx lesion is 85% stenosed. Ramus lesion is 30% stenosed. Previously placed 1st Diag stent (unknown type) is widely patent. Mid LAD to Dist LAD lesion is 60% stenosed. Mid LAD lesion is 40% stenosed. Dist RCA lesion is 60% stenosed. Mid RCA lesion is 30% stenosed. Previously placed Prox RCA stent (unknown type) is widely patent.   Widely patent stents in the high diagonal and proximal RCA inserted in October 2021.   The LAD has tortuosity in its mid segment with 40 and 60% narrowing.  This is not significantly changed from the prior angiogram.   Mild disease in the ramus immediate vessel.   Previously noted very small AV groove circumflex with diffuse narrowing of 80%.   Dominant RCA with patent proximal stent and mild 30% mid stenosis with 50 to 60% narrowing before the PDA takeoff.   Mild right heart pressure elevation pulmonary hypertension.   RECOMMENDATION: Guideline directed medical therapy for heart failure with reduced EF on recent echo at 25 to 30%.  Medical therapy for concomitant CAD.  Diagnostic Dominance: Right  Intervention  Echo 05/15/21: IMPRESSIONS     1. Left ventricular  ejection fraction, by estimation, is 45 to 50%. The  left ventricle has mildly decreased function. The left ventricle  demonstrates global hypokinesis with septal-lateral dyssynchrony  consistent with LBBB. Left ventricular diastolic  parameters are consistent with Grade I diastolic dysfunction (impaired  relaxation).   2. Right ventricular systolic function  is normal. The right ventricular  size is normal. There is normal pulmonary artery systolic pressure. The  estimated right ventricular systolic pressure is 21.1 mmHg.   3. The mitral valve is normal in structure. Mild mitral valve  regurgitation. No evidence of mitral stenosis.   4. The aortic valve is tricuspid. There is mild calcification of the  aortic valve. Aortic valve regurgitation is not visualized. No aortic  stenosis is present.   5. The inferior vena cava is normal in size with greater than 50%  respiratory variability, suggesting right atrial pressure of 3 mmHg.   EKG:  EKG is not ordered today.    Recent Labs: No results found for requested labs within last 365 days.  Recent Lipid Panel    Component Value Date/Time   CHOL 251 (H) 11/26/2021 0916   TRIG 203 (H) 11/26/2021 0916   HDL 42 11/26/2021 0916   CHOLHDL 6.0 (H) 11/26/2021 0916   CHOLHDL 4.5 12/10/2020 0511   VLDL 29 12/10/2020 0511   LDLCALC 171 (H) 11/26/2021 0916     Risk Assessment/Calculations:       Physical Exam:    VS:  BP 128/62   Pulse 77   Ht 5\' 2"  (1.575 m)   Wt 122 lb 6.4 oz (55.5 kg)   SpO2 96%   BMI 22.39 kg/m     Wt Readings from Last 3 Encounters:  01/29/23 122 lb 6.4 oz (55.5 kg)  08/04/22 123 lb (55.8 kg)  05/26/22 121 lb (54.9 kg)     GEN:  Well nourished, well developed in no acute distress HEENT: Normal NECK: No JVD; No carotid bruits LYMPHATICS: No lymphadenopathy CARDIAC: RRR, no murmurs, rubs, gallops RESPIRATORY:  Clear to auscultation without rales, wheezing or rhonchi  ABDOMEN: Soft, non-tender,  non-distended MUSCULOSKELETAL:  No edema; No deformity  SKIN: Warm and dry NEUROLOGIC:  Alert and oriented x 3 PSYCHIATRIC:  Normal affect   ASSESSMENT:    1. Coronary artery disease of native artery of native heart with stable angina pectoris (HCC)   2. Chronic combined systolic and diastolic heart failure (HCC)   3. LBBB (left bundle branch block)   4. Hyperlipidemia LDL goal <70   5. Essential hypertension     PLAN:    In order of problems listed above:  CAD s/p stenting of RCA and diagonal. Stable anginal symptoms. Contiue Plavix, Z beta. Intolerant of statins and nitrates Chronic combined systolic/diastolic CHF. Last EF 40-45%. Well compensated on aldactone, Z beta. Low sodium diet. Did not tolerate ARB in past. Declined SGLT 2 inhibitor LBBB chronic Hyperlipidemia. Statin intolerant. Patient has declined further Rx HTN controlled.       Follow up in 6 months   Medication Adjustments/Labs and Tests Ordered: Current medicines are reviewed at length with the patient today.  Concerns regarding medicines are outlined above.  No orders of the defined types were placed in this encounter.  No orders of the defined types were placed in this encounter.   Patient Instructions  Medication Instructions:  Continue same medications *If you need a refill on your cardiac medications before your next appointment, please call your pharmacy*   Lab Work: None ordered   Testing/Procedures: None ordered   Follow-Up: At [facility], you and your health needs are our priority.  As part of our continuing mission to provide you with exceptional heart care, we have created designated Provider Care Teams.  These Care Teams include your primary Cardiologist (physician) and Advanced Practice Providers (  APPs -  Physician Assistants and Nurse Practitioners) who all work together to provide you with the care you need, when you need it.  We recommend signing up for the patient  portal called "MyChart".  Sign up information is provided on this After Visit Summary.  MyChart is used to connect with patients for Virtual Visits (Telemedicine).  Patients are able to view lab/test results, encounter notes, upcoming appointments, etc.  Non-urgent messages can be sent to your provider as well.   To learn more about what you can do with MyChart, go to [email or web address].    Your next appointment:  6 months     Call in Sept to schedule Jan appointment     Provider: Dr.Srinidhi Landers      Signed, Abdullah Rizzi [place], MD  01/29/2023 10:06 AM    [facility]

## 2023-01-29 ENCOUNTER — Encounter: Payer: Self-pay | Admitting: Cardiology

## 2023-01-29 ENCOUNTER — Ambulatory Visit: Payer: Medicare Other | Attending: Cardiology | Admitting: Cardiology

## 2023-01-29 VITALS — BP 128/62 | HR 77 | Ht 62.0 in | Wt 122.4 lb

## 2023-01-29 DIAGNOSIS — E785 Hyperlipidemia, unspecified: Secondary | ICD-10-CM | POA: Diagnosis present

## 2023-01-29 DIAGNOSIS — I10 Essential (primary) hypertension: Secondary | ICD-10-CM | POA: Diagnosis present

## 2023-01-29 DIAGNOSIS — I447 Left bundle-branch block, unspecified: Secondary | ICD-10-CM | POA: Insufficient documentation

## 2023-01-29 DIAGNOSIS — I5042 Chronic combined systolic (congestive) and diastolic (congestive) heart failure: Secondary | ICD-10-CM | POA: Diagnosis present

## 2023-01-29 DIAGNOSIS — I25118 Atherosclerotic heart disease of native coronary artery with other forms of angina pectoris: Secondary | ICD-10-CM | POA: Insufficient documentation

## 2023-01-29 NOTE — Patient Instructions (Signed)
Medication Instructions:  Continue same medications *If you need a refill on your cardiac medications before your next appointment, please call your pharmacy*   Lab Work: None ordered   Testing/Procedures: None ordered   Follow-Up: At [facility], you and your health needs are our priority.  As part of our continuing mission to provide you with exceptional heart care, we have created designated Provider Care Teams.  These Care Teams include your primary Cardiologist (physician) and Advanced Practice Providers (APPs -  Physician Assistants and Nurse Practitioners) who all work together to provide you with the care you need, when you need it.  We recommend signing up for the patient portal called "MyChart".  Sign up information is provided on this After Visit Summary.  MyChart is used to connect with patients for Virtual Visits (Telemedicine).  Patients are able to view lab/test results, encounter notes, upcoming appointments, etc.  Non-urgent messages can be sent to your provider as well.   To learn more about what you can do with MyChart, go to [email or web address].    Your next appointment:  6 months    Call in Sept to schedule Jan appointment     Provider:  Dr.Jordan   

## 2023-02-17 ENCOUNTER — Other Ambulatory Visit: Payer: Self-pay

## 2023-02-17 DIAGNOSIS — I5042 Chronic combined systolic (congestive) and diastolic (congestive) heart failure: Secondary | ICD-10-CM

## 2023-02-17 DIAGNOSIS — I2510 Atherosclerotic heart disease of native coronary artery without angina pectoris: Secondary | ICD-10-CM

## 2023-02-17 DIAGNOSIS — Z79899 Other long term (current) drug therapy: Secondary | ICD-10-CM

## 2023-02-17 DIAGNOSIS — E785 Hyperlipidemia, unspecified: Secondary | ICD-10-CM

## 2023-02-17 MED ORDER — CLOPIDOGREL BISULFATE 75 MG PO TABS: 75.0000 mg | ORAL_TABLET | Freq: Every day | ORAL | 3 refills | Status: DC

## 2023-04-20 ENCOUNTER — Ambulatory Visit: Payer: Medicare Other | Admitting: Cardiology

## 2023-05-09 IMAGING — CR DG CHEST 2V
2 series · 2 of 2 positions shown · non-contrast
Comparison: Two-view chest x-ray 08/30/2020

CLINICAL DATA: Chest pain beginning yesterday. Intermittent chest
pain.

EXAM:
CHEST - 2 VIEW

[chest pa]
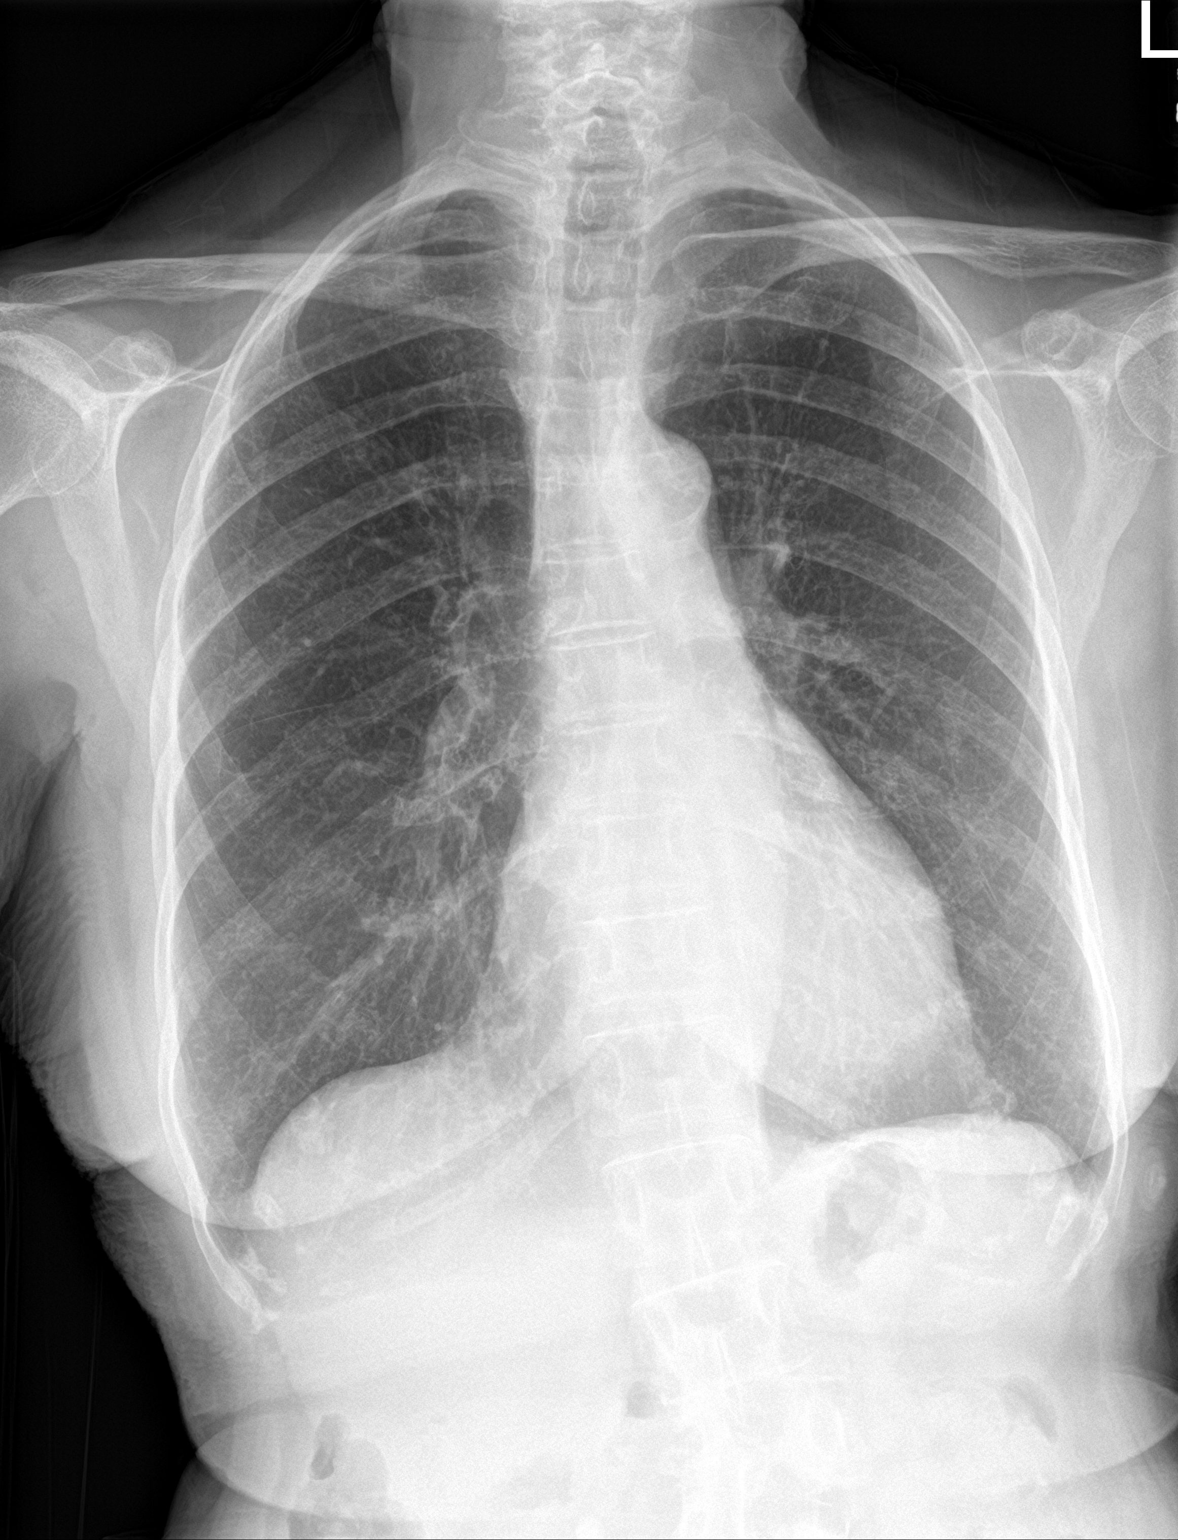

[chest lat]
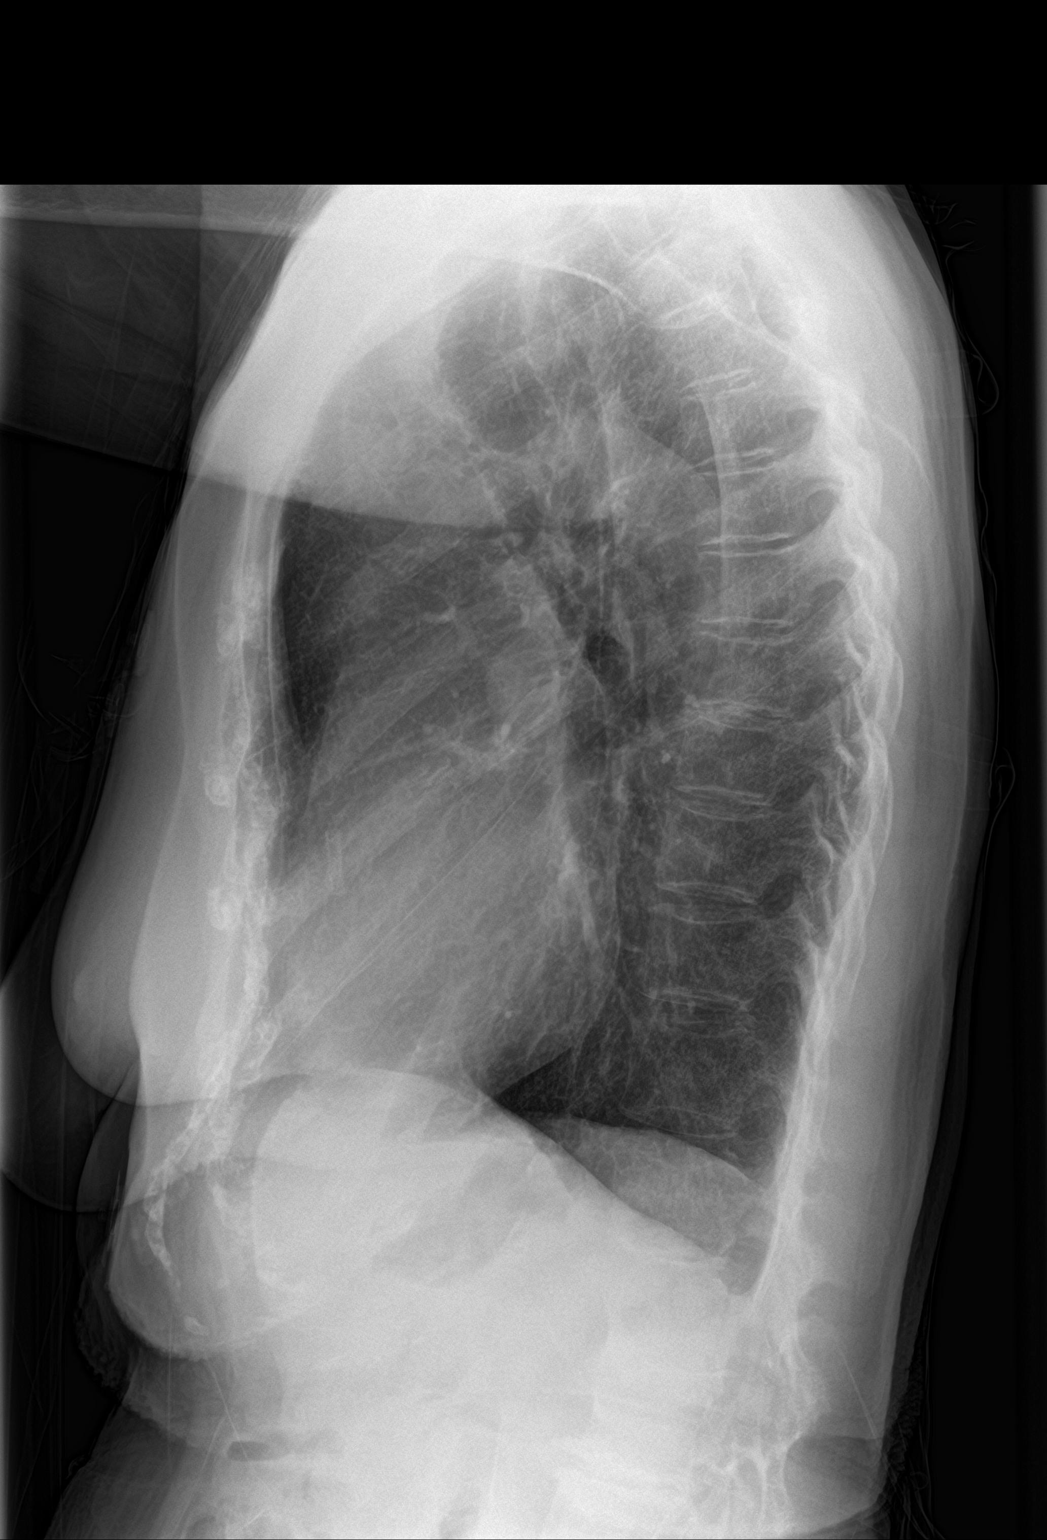

[2 of 2 positions shown; findings below may reference images not displayed]

FINDINGS: Heart size is normal. Changes of COPD noted. No edema or effusion is
present. No focal airspace disease is present. Axial skeleton is
unremarkable.
IMPRESSION: 1. No acute cardiopulmonary disease.
2. COPD.

## 2023-06-23 ENCOUNTER — Other Ambulatory Visit: Payer: Self-pay | Admitting: Cardiology

## 2023-06-23 DIAGNOSIS — I2510 Atherosclerotic heart disease of native coronary artery without angina pectoris: Secondary | ICD-10-CM

## 2023-06-23 DIAGNOSIS — I5042 Chronic combined systolic (congestive) and diastolic (congestive) heart failure: Secondary | ICD-10-CM

## 2023-06-23 DIAGNOSIS — Z79899 Other long term (current) drug therapy: Secondary | ICD-10-CM

## 2023-06-23 DIAGNOSIS — E785 Hyperlipidemia, unspecified: Secondary | ICD-10-CM

## 2023-08-03 NOTE — Progress Notes (Deleted)
Cardiology Office Note:    Date:  08/03/2023   ID:  NATAUSHA JUNGWIRTH, DOB 01/28/1933, MRN [id number]  PCP:  Nona Dell, NP   [facility] Providers Cardiologist:  Meriam Sprague, MD (Inactive) Cardiology APP:  Kennon Rounds     Referring MD: Nona Dell, NP   No chief complaint on file.   History of Present Illness:    Patricia Garcia is a 89 y.o. female seen as a work in for evaluation of chest pain. Followed by Dr Shari Prows- last seen in November.   The patient suffered a mechanical fall resulting in hip fracture requiring surgery on 05/02/20.  She was readmitted to [facility] 05/06/20 with nausea, vomiting, and hypoxia.  She was ultimately found to have a NSTEMI (trops in 3k range) and acute on chronic diastolic heart failure.  She was treated with IV Lasix.  TTE 05/07/2020 showed hypokinesis of the basal inferior and inferolateral wall, EF 50 to 55%, grade 2 diastolic dysfunction, elevated left atrial pressure, mild mitral regurgitation.  She underwent cardiac cath 05/08/2020 showing three-vessel obstructive coronary artery disease as outlined below ultimately received DES to mid RCA and first diagonal.  Hospitalization was also notable for anemia requiring blood transfusion, hypokalemia, gait instability, hypokalemia, hypoalbuminemia, mild AKI, and nocturnal confusion.   She was seen in  ED 11/2020 with nausea, vomiting, shortness of breath. Chest x-ray with small bilateral pleural effusions.  BNP elevated 1701.  Troponin negative x2.  She was admitted for CHF exacerbation.  Echo 12/07/2020 with LVEF 25-30%, mildly reduced RV systolic function.  Right and left heart cath 12/10/2020 with patent prior placement in RCA and first diagonal and otherwise stable CAD, mildly elevated right heart pressures.  She was diuresed from 123 to 118 pounds.   She was seen in the ED 03/13/21 with chest pain.  Her hs-Trops were neg (13>>13).  CXR was unremarkable.  EKG  showed old LBBB.  Labs: Hgb 13.2, BNP 398, SCr 0.95, K+4.1, ALP 122, ALT 19.  She was DC home and saw Tereso Newcomer in follow-up. She was started on imdur at that time, but later stopped it because she stated she could not tolerate it. Repeat TTE 04/2021 showed improvement of LVEF 40-45%.   On follow up today she is doing very well. Denies any chest pain, dyspnea, palpitations, dizziness or edema. Weight is stable. She walks a lot and is independent.    Past Medical History:  Diagnosis Date   Anal or rectal pain    Anemia    Arthritis    CAD (coronary artery disease)    a. NSTEMI 04/2020 s/p DES to [facility] and D1.   Depression    Diverticulosis of colon (without mention of hemorrhage)    HFrEF (heart failure with reduced ejection fraction) (HCC)    Echo 5/22: EF 25-30 // Echocardiogram 10/22: EF 40-45, mild LVH, Gr 1 DD, inf and inf-sept HK, normal RVSF, mild LAE, mild MR   Hypertension    Hypoalbuminemia    Internal hemorrhoids without mention of complication    Ischemic heart disease    LBBB (left bundle branch block)    Osteopenia    Renal insufficiency    S/P right hip fracture    Slow transit constipation     Past Surgical History:  Procedure Laterality Date   ABDOMINAL HYSTERECTOMY     CARDIAC CATHETERIZATION     CATARACT EXTRACTION, BILATERAL     CORONARY STENT INTERVENTION N/A 05/08/2020  Procedure: CORONARY STENT INTERVENTION;  Surgeon: [place], Adalin Vanderploeg M, MD;  Location: [facility] INVASIVE CV LAB;  Service: Cardiovascular;  Laterality: N/A;   LEFT HEART CATH AND CORONARY ANGIOGRAPHY N/A 05/08/2020   Procedure: LEFT HEART CATH AND CORONARY ANGIOGRAPHY;  Surgeon: [place], Lajarvis Italiano M, MD;  Location: [facility] INVASIVE CV LAB;  Service: Cardiovascular;  Laterality: N/A;   MASS EXCISION  06/10/2011   Procedure: EXCISION MASS;  Surgeon: Nicki Reaper, MD;  Location: [facility];  Service: Orthopedics;  Laterality: Right;  excision cyst right thumb IP, debridement IP right thumb    RIGHT/LEFT HEART CATH AND CORONARY ANGIOGRAPHY N/A 12/10/2020   Procedure: RIGHT/LEFT HEART CATH AND CORONARY ANGIOGRAPHY;  Surgeon: Lennette Bihari, MD;  Location: [facility] INVASIVE CV LAB;  Service: Cardiovascular;  Laterality: N/A;   TONSILLECTOMY     TOTAL HIP ARTHROPLASTY Right 05/02/2020   Procedure: TOTAL HIP ARTHROPLASTY ANTERIOR APPROACH;  Surgeon: Samson Frederic, MD;  Location: [facility] ORS;  Service: Orthopedics;  Laterality: Right;    Current Medications: No outpatient medications have been marked as taking for the 08/11/23 encounter (Appointment) with [place], Somaya Grassi M, MD.     Allergies:   Imdur [isosorbide nitrate], Alendronate sodium, Doxycycline, Eucalyptus oil, Linaclotide, Medrol [methylprednisolone], Penicillamine, Penicillins, Prednisone, Risedronate, Statins, Statins support [a-g pro], Statins support [acid blockers support], Sulfa antibiotics, Sulfonamide derivatives, Tramadol, Latex, and Tape   Social History   Socioeconomic History   Marital status: Widowed    Spouse name: Not on file   Number of children: 2   Years of education: 12   Highest education level: High school graduate  Occupational History   Occupation: Retired    Associate Professor: RETIRED  Tobacco Use   Smoking status: Former    Current packs/day: 0.00    Types: Cigarettes    Quit date: 06/05/1984    Years since quitting: 39.1   Smokeless tobacco: Never  Vaping Use   Vaping status: Never Used  Substance and Sexual Activity   Alcohol use: No   Drug use: No   Sexual activity: Not on file  Other Topics Concern   Not on file  Social History Narrative   Son mark local    Daughter out of town in [place]   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   Social Drivers of Health   Financial Resource Strain: Low Risk  (08/06/2021)   Overall Financial Resource Strain (CARDIA)    Difficulty of Paying Living Expenses: Not hard at all  Food Insecurity: Unknown (08/06/2021)   Hunger Vital Sign    Worried About  Running Out of Food in the Last Year: Never true    Ran Out of Food in the Last Year: Not on file  Transportation Needs: No Transportation Needs (08/06/2021)   PRAPARE - Administrator, Civil Service (Medical): No    Lack of Transportation (Non-Medical): No  Physical Activity: Insufficiently Active (10/25/2020)   Exercise Vital Sign    Days of Exercise per Week: 2 days    Minutes of Exercise per Session: 20 min  Stress: No Stress Concern Present (08/06/2021)   [employer] of Occupational Health - Occupational Stress Questionnaire    Feeling of Stress : Only a little  Social Connections: Moderately Integrated (10/25/2020)   Social Connection and Isolation Panel [NHANES]    Frequency of Communication with Friends and Family: More than three times a week    Frequency of Social Gatherings with Friends and Family: More than three times a week  Attends Religious Services: 1 to 4 times per year    Active Member of Clubs or Organizations: Yes    Attends Banker Meetings: 1 to 4 times per year    Marital Status: Widowed     Family History: The patient's family history includes Cancer in her father; Colon polyps in her mother; Heart disease in her father.  ROS:   Please see the history of present illness.     All other systems reviewed and are negative.  EKGs/Labs/Other Studies Reviewed:    The following studies were reviewed today:  Cardiac cath 12/10/20:  RIGHT/LEFT HEART CATH AND CORONARY ANGIOGRAPHY   Conclusion    Prox Cx to Mid Cx lesion is 85% stenosed. Ramus lesion is 30% stenosed. Previously placed 1st Diag stent (unknown type) is widely patent. Mid LAD to Dist LAD lesion is 60% stenosed. Mid LAD lesion is 40% stenosed. Dist RCA lesion is 60% stenosed. Mid RCA lesion is 30% stenosed. Previously placed Prox RCA stent (unknown type) is widely patent.   Widely patent stents in the high diagonal and proximal RCA inserted in October 2021.   The  LAD has tortuosity in its mid segment with 40 and 60% narrowing.  This is not significantly changed from the prior angiogram.   Mild disease in the ramus immediate vessel.   Previously noted very small AV groove circumflex with diffuse narrowing of 80%.   Dominant RCA with patent proximal stent and mild 30% mid stenosis with 50 to 60% narrowing before the PDA takeoff.   Mild right heart pressure elevation pulmonary hypertension.   RECOMMENDATION: Guideline directed medical therapy for heart failure with reduced EF on recent echo at 25 to 30%.  Medical therapy for concomitant CAD.  Diagnostic Dominance: Right  Intervention  Echo 05/15/21: IMPRESSIONS     1. Left ventricular ejection fraction, by estimation, is 45 to 50%. The  left ventricle has mildly decreased function. The left ventricle  demonstrates global hypokinesis with septal-lateral dyssynchrony  consistent with LBBB. Left ventricular diastolic  parameters are consistent with Grade I diastolic dysfunction (impaired  relaxation).   2. Right ventricular systolic function is normal. The right ventricular  size is normal. There is normal pulmonary artery systolic pressure. The  estimated right ventricular systolic pressure is 21.1 mmHg.   3. The mitral valve is normal in structure. Mild mitral valve  regurgitation. No evidence of mitral stenosis.   4. The aortic valve is tricuspid. There is mild calcification of the  aortic valve. Aortic valve regurgitation is not visualized. No aortic  stenosis is present.   5. The inferior vena cava is normal in size with greater than 50%  respiratory variability, suggesting right atrial pressure of 3 mmHg.   EKG:  EKG is not ordered today.    Recent Labs: No results found for requested labs within last 365 days.  Recent Lipid Panel    Component Value Date/Time   CHOL 251 (H) 11/26/2021 0916   TRIG 203 (H) 11/26/2021 0916   HDL 42 11/26/2021 0916   CHOLHDL 6.0 (H) 11/26/2021 0916    CHOLHDL 4.5 12/10/2020 0511   VLDL 29 12/10/2020 0511   LDLCALC 171 (H) 11/26/2021 0916     Risk Assessment/Calculations:       Physical Exam:    VS:  There were no vitals taken for this visit.    Wt Readings from Last 3 Encounters:  01/29/23 122 lb 6.4 oz (55.5 kg)  08/04/22 123 lb (55.8 kg)  05/26/22 121 lb (54.9 kg)     GEN:  Well nourished, well developed in no acute distress HEENT: Normal NECK: No JVD; No carotid bruits LYMPHATICS: No lymphadenopathy CARDIAC: RRR, no murmurs, rubs, gallops RESPIRATORY:  Clear to auscultation without rales, wheezing or rhonchi  ABDOMEN: Soft, non-tender, non-distended MUSCULOSKELETAL:  No edema; No deformity  SKIN: Warm and dry NEUROLOGIC:  Alert and oriented x 3 PSYCHIATRIC:  Normal affect   ASSESSMENT:    No diagnosis found.   PLAN:    In order of problems listed above:  CAD s/p stenting of RCA and diagonal. Stable anginal symptoms. Contiue Plavix, Z beta. Intolerant of statins and nitrates Chronic combined systolic/diastolic CHF. Last EF 40-45%. Well compensated on aldactone, Z beta. Low sodium diet. Did not tolerate ARB in past. Declined SGLT 2 inhibitor LBBB chronic Hyperlipidemia. Statin intolerant. Patient has declined further Rx HTN controlled.       Follow up in 6 months   Medication Adjustments/Labs and Tests Ordered: Current medicines are reviewed at length with the patient today.  Concerns regarding medicines are outlined above.  No orders of the defined types were placed in this encounter.  No orders of the defined types were placed in this encounter.   There are no Patient Instructions on file for this visit.   Signed, Wojciech Willetts [place], MD  08/03/2023 12:27 PM    [facility]

## 2023-08-11 ENCOUNTER — Ambulatory Visit: Payer: Medicare Other | Admitting: Cardiology

## 2023-08-12 ENCOUNTER — Other Ambulatory Visit: Payer: Self-pay

## 2023-08-12 ENCOUNTER — Other Ambulatory Visit: Payer: Self-pay | Admitting: Cardiology

## 2023-08-12 DIAGNOSIS — I5042 Chronic combined systolic (congestive) and diastolic (congestive) heart failure: Secondary | ICD-10-CM

## 2023-08-12 DIAGNOSIS — E785 Hyperlipidemia, unspecified: Secondary | ICD-10-CM

## 2023-08-12 DIAGNOSIS — Z79899 Other long term (current) drug therapy: Secondary | ICD-10-CM

## 2023-08-12 DIAGNOSIS — I2510 Atherosclerotic heart disease of native coronary artery without angina pectoris: Secondary | ICD-10-CM

## 2023-08-12 MED ORDER — FUROSEMIDE 20 MG PO TABS: 20.0000 mg | ORAL_TABLET | Freq: Every day | ORAL | 1 refills | Status: DC

## 2023-08-12 MED ORDER — SPIRONOLACTONE 25 MG PO TABS: 12.5000 mg | ORAL_TABLET | Freq: Every day | ORAL | 1 refills | Status: DC

## 2023-08-22 NOTE — Progress Notes (Signed)
 Cardiology Office Note:    Date:  08/25/2023   ID:  [name], DOB August 01, 1932, MRN [id number]  PCP:  Ileen Rosaline NOVAK, NP   [facility] Providers Cardiologist:  Powell FORBES Sorrow, MD (Inactive) Cardiology APP:  Lelon Glendia ONEIDA DEVONNA     Referring MD: Ileen Rosaline NOVAK, NP   Chief Complaint  Patient presents with   Coronary Artery Disease    History of Present Illness:    Patricia Garcia is a 89 y.o. female seen as a work in for evaluation of chest pain. Followed by Dr Sorrow- last seen in November.   The patient suffered a mechanical fall resulting in hip fracture requiring surgery on 05/02/20.  She was readmitted to [facility] 05/06/20 with nausea, vomiting, and hypoxia.  She was ultimately found to have a NSTEMI (trops in 3k range) and acute on chronic diastolic heart failure.  She was treated with IV Lasix .  TTE 05/07/2020 showed hypokinesis of the basal inferior and inferolateral wall, EF 50 to 55%, grade 2 diastolic dysfunction, elevated left atrial pressure, mild mitral regurgitation.  She underwent cardiac cath 05/08/2020 showing three-vessel obstructive coronary artery disease as outlined below ultimately received DES to mid RCA and first diagonal.  Hospitalization was also notable for anemia requiring blood transfusion, hypokalemia, gait instability, hypokalemia, hypoalbuminemia, mild AKI, and nocturnal confusion.   She was seen in  ED 11/2020 with nausea, vomiting, shortness of breath. Chest x-ray with small bilateral pleural effusions.  BNP elevated 1701.  Troponin negative x2.  She was admitted for CHF exacerbation.  Echo 12/07/2020 with LVEF 25-30%, mildly reduced RV systolic function.  Right and left heart cath 12/10/2020 with patent prior placement in RCA and first diagonal and otherwise stable CAD, mildly elevated right heart pressures.  She was diuresed from 123 to 118 pounds.   She was seen in the ED 03/13/21 with chest pain.  Her hs-Trops were  neg (13>>13).  CXR was unremarkable.  EKG showed old LBBB.  Labs: Hgb 13.2, BNP 398, SCr 0.95, K+4.1, ALP 122, ALT 19.  She was DC home and saw Glendia Lelon in follow-up. She was started on imdur  at that time, but later stopped it because she stated she could not tolerate it. Repeat TTE 04/2021 showed improvement of LVEF 40-45%.   On follow up today she is doing very well. She lives alone. Denies any chest pain, palpitations, dizziness or edema. No dyspnea.    Past Medical History:  Diagnosis Date   Anal or rectal pain    Anemia    Arthritis    CAD (coronary artery disease)    a. NSTEMI 04/2020 s/p DES to [facility] and D1.   Depression    Diverticulosis of colon (without mention of hemorrhage)    HFrEF (heart failure with reduced ejection fraction) (HCC)    Echo 5/22: EF 25-30 // Echocardiogram 10/22: EF 40-45, mild LVH, Gr 1 DD, inf and inf-sept HK, normal RVSF, mild LAE, mild MR   Hypertension    Hypoalbuminemia    Internal hemorrhoids without mention of complication    Ischemic heart disease    LBBB (left bundle branch block)    Osteopenia    Renal insufficiency    S/P right hip fracture    Slow transit constipation     Past Surgical History:  Procedure Laterality Date   ABDOMINAL HYSTERECTOMY     CARDIAC CATHETERIZATION     CATARACT EXTRACTION, BILATERAL     CORONARY STENT INTERVENTION N/A  05/08/2020   Procedure: CORONARY STENT INTERVENTION;  Surgeon: Arianah Torgeson M, MD;  Location: [facility] INVASIVE CV LAB;  Service: Cardiovascular;  Laterality: N/A;   LEFT HEART CATH AND CORONARY ANGIOGRAPHY N/A 05/08/2020   Procedure: LEFT HEART CATH AND CORONARY ANGIOGRAPHY;  Surgeon: Kendrick Haapala M, MD;  Location: [facility] INVASIVE CV LAB;  Service: Cardiovascular;  Laterality: N/A;   MASS EXCISION  06/10/2011   Procedure: EXCISION MASS;  Surgeon: Arley JONELLE Curia, MD;  Location: [facility];  Service: Orthopedics;  Laterality: Right;  excision cyst right thumb IP, debridement IP right thumb    RIGHT/LEFT HEART CATH AND CORONARY ANGIOGRAPHY N/A 12/10/2020   Procedure: RIGHT/LEFT HEART CATH AND CORONARY ANGIOGRAPHY;  Surgeon: Burnard Debby LABOR, MD;  Location: [facility] INVASIVE CV LAB;  Service: Cardiovascular;  Laterality: N/A;   TONSILLECTOMY     TOTAL HIP ARTHROPLASTY Right 05/02/2020   Procedure: TOTAL HIP ARTHROPLASTY ANTERIOR APPROACH;  Surgeon: Fidel Rogue, MD;  Location: [facility] ORS;  Service: Orthopedics;  Laterality: Right;    Current Medications: Current Meds  Medication Sig   Acetaminophen  (TYLENOL ) 325 MG CAPS 2 tabs as needed Orally every 6 hrs   ALPRAZolam  (XANAX ) 0.25 MG tablet 1 tablet as needed Orally daily   Calcium Carbonate-Vit D-Min (CALCIUM 1200 PO) Take 1,200 mg by mouth daily.   Cholecalciferol  (VITAMIN D -3 PO) Take 1 capsule by mouth daily after supper. Per Patient taking 1,000 units daily   clopidogrel  (PLAVIX ) 75 MG tablet Take 1 tablet (75 mg total) by mouth daily.   diclofenac Sodium (VOLTAREN) 1 % GEL Apply topically as needed. Taking 3 to 5 days   docusate sodium  (COLACE) 100 MG capsule Take 100 mg by mouth in the morning.   furosemide  (LASIX ) 20 MG tablet Take 1 tablet (20 mg total) by mouth daily.   latanoprost  (XALATAN ) 0.005 % ophthalmic solution Place 1 drop into both eyes at bedtime.   Multiple Vitamins-Minerals (MULTIVITAMIN ADULTS 50+) TABS Take 1 tablet by mouth daily with breakfast.   Omega-3 Fatty Acids (FISH OIL BURP-LESS) 1200 MG CAPS 1 capsule Orally Twice a day   pantoprazole  (PROTONIX ) 40 MG tablet TAKE ONE TABLET BY MOUTH DAILY   Polyethyl Glycol-Propyl Glycol (SYSTANE) 0.4-0.3 % SOLN as directed Ophthalmic   spironolactone  (ALDACTONE ) 25 MG tablet Take 0.5 tablets (12.5 mg total) by mouth daily.   [DISCONTINUED] bisoprolol  (ZEBETA ) 5 MG tablet Take 0.5 tablets (2.5 mg total) by mouth daily.     Allergies:   Imdur  [isosorbide  nitrate], Alendronate sodium, Doxycycline , Eucalyptus oil, Linaclotide , Medrol  [methylprednisolone ], Penicillamine,  Penicillins, Prednisone, Risedronate, Statins, Statins support [a-g pro], Statins support [acid blockers support], Sulfa antibiotics, Sulfonamide derivatives, Tramadol, Latex, and Tape   Social History   Socioeconomic History   Marital status: Widowed    Spouse name: Not on file   Number of children: 2   Years of education: 12   Highest education level: High school graduate  Occupational History   Occupation: Retired    Associate Professor: RETIRED  Tobacco Use   Smoking status: Former    Current packs/day: 0.00    Types: Cigarettes    Quit date: 06/05/1984    Years since quitting: 39.2   Smokeless tobacco: Never  Vaping Use   Vaping status: Never Used  Substance and Sexual Activity   Alcohol  use: No   Drug use: No   Sexual activity: Not on file  Other Topics Concern   Not on file  Social History Narrative   Son mark local  Daughter out of town in [place]    Member of the choral society -ecologist   Social Drivers of Health   Financial Resource Strain: Low Risk  (08/06/2021)   Overall Financial Resource Strain (CARDIA)    Difficulty of Paying Living Expenses: Not hard at all  Food Insecurity: Unknown (08/06/2021)   Hunger Vital Sign    Worried About Running Out of Food in the Last Year: Never true    Ran Out of Food in the Last Year: Not on file  Transportation Needs: No Transportation Needs (08/06/2021)   PRAPARE - Administrator, Civil Service (Medical): No    Lack of Transportation (Non-Medical): No  Physical Activity: Insufficiently Active (10/25/2020)   Exercise Vital Sign    Days of Exercise per Week: 2 days    Minutes of Exercise per Session: 20 min  Stress: No Stress Concern Present (08/06/2021)   [employer] of Occupational Health - Occupational Stress Questionnaire    Feeling of Stress : Only a little  Social Connections: Moderately Integrated (10/25/2020)   Social Connection and Isolation Panel [NHANES]    Frequency of Communication with  Friends and Family: More than three times a week    Frequency of Social Gatherings with Friends and Family: More than three times a week    Attends Religious Services: 1 to 4 times per year    Active Member of [employer] or Organizations: Yes    Attends Banker Meetings: 1 to 4 times per year    Marital Status: Widowed     Family History: The patient's family history includes Cancer in her father; Colon polyps in her mother; Heart disease in her father.  ROS:   Please see the history of present illness.     All other systems reviewed and are negative.  EKGs/Labs/Other Studies Reviewed:    The following studies were reviewed today:  EKG Interpretation Date/Time:  Tuesday August 25 2023 16:29:31 EST Ventricular Rate:  77 PR Interval:  286 QRS Duration:  154 QT Interval:  428 QTC Calculation: 484 R Axis:   -49  Text Interpretation: Sinus rhythm with sinus arrhythmia with 1st degree A-V block Left axis deviation Left bundle branch block When compared with ECG of 13-Mar-2021 14:52, No significant change was found Confirmed by Arthur Aydelotte [phone]) on 08/25/2023 4:56:28 PM    Cardiac cath 12/10/20:  RIGHT/LEFT HEART CATH AND CORONARY ANGIOGRAPHY   Conclusion    Prox Cx to Mid Cx lesion is 85% stenosed. Ramus lesion is 30% stenosed. Previously placed 1st Diag stent (unknown type) is widely patent. Mid LAD to Dist LAD lesion is 60% stenosed. Mid LAD lesion is 40% stenosed. Dist RCA lesion is 60% stenosed. Mid RCA lesion is 30% stenosed. Previously placed Prox RCA stent (unknown type) is widely patent.   Widely patent stents in the high diagonal and proximal RCA inserted in October 2021.   The LAD has tortuosity in its mid segment with 40 and 60% narrowing.  This is not significantly changed from the prior angiogram.   Mild disease in the ramus immediate vessel.   Previously noted very small AV groove circumflex with diffuse narrowing of 80%.   Dominant RCA with  patent proximal stent and mild 30% mid stenosis with 50 to 60% narrowing before the PDA takeoff.   Mild right heart pressure elevation pulmonary hypertension.   RECOMMENDATION: Guideline directed medical therapy for heart failure with reduced EF on recent echo at 25 to 30%.  Medical therapy  for concomitant CAD.  Diagnostic Dominance: Right  Intervention  Echo 05/15/21: IMPRESSIONS     1. Left ventricular ejection fraction, by estimation, is 45 to 50%. The  left ventricle has mildly decreased function. The left ventricle  demonstrates global hypokinesis with septal-lateral dyssynchrony  consistent with LBBB. Left ventricular diastolic  parameters are consistent with Grade I diastolic dysfunction (impaired  relaxation).   2. Right ventricular systolic function is normal. The right ventricular  size is normal. There is normal pulmonary artery systolic pressure. The  estimated right ventricular systolic pressure is 21.1 mmHg.   3. The mitral valve is normal in structure. Mild mitral valve  regurgitation. No evidence of mitral stenosis.   4. The aortic valve is tricuspid. There is mild calcification of the  aortic valve. Aortic valve regurgitation is not visualized. No aortic  stenosis is present.   5. The inferior vena cava is normal in size with greater than 50%  respiratory variability, suggesting right atrial pressure of 3 mmHg.   EKG:  EKG is not ordered today.    Recent Labs: No results found for requested labs within last 365 days.  Recent Lipid Panel    Component Value Date/Time   CHOL 251 (H) 11/26/2021 0916   TRIG 203 (H) 11/26/2021 0916   HDL 42 11/26/2021 0916   CHOLHDL 6.0 (H) 11/26/2021 0916   CHOLHDL 4.5 12/10/2020 0511   VLDL 29 12/10/2020 0511   LDLCALC 171 (H) 11/26/2021 0916     Risk Assessment/Calculations:       Physical Exam:    VS:  BP 106/64 (BP Location: Left Arm, Patient Position: Sitting, Cuff Size: Normal)   Pulse 77   Ht 5' 2 (1.575 m)    Wt 122 lb (55.3 kg)   BMI 22.31 kg/m     Wt Readings from Last 3 Encounters:  08/25/23 122 lb (55.3 kg)  01/29/23 122 lb 6.4 oz (55.5 kg)  08/04/22 123 lb (55.8 kg)     GEN:  Well nourished, well developed in no acute distress HEENT: Normal NECK: No JVD; No carotid bruits LYMPHATICS: No lymphadenopathy CARDIAC: RRR, no murmurs, rubs, gallops RESPIRATORY:  Clear to auscultation without rales, wheezing or rhonchi  ABDOMEN: Soft, non-tender, non-distended MUSCULOSKELETAL:  No edema; No deformity  SKIN: Warm and dry NEUROLOGIC:  Alert and oriented x 3 PSYCHIATRIC:  Normal affect   ASSESSMENT:    1. Essential hypertension   2. Chronic combined systolic and diastolic heart failure (HCC)   3. Hyperlipidemia LDL goal <70   4. Medication management   5. Coronary artery disease involving native coronary artery of native heart without angina pectoris      PLAN:    In order of problems listed above:  CAD s/p stenting of RCA and diagonal in 2021. No significant angina. Continue Plavix  monotherapy and Zbeta. Intolerant of statins and nitrates Chronic combined systolic/diastolic CHF. Last EF 40-45%. Euvolemic. Low salt diet. Did not tolerate ARB in past. Declined SGLT 2 inhibitor LBBB chronic Hyperlipidemia. Statin intolerant. Patient has declined further Rx. Last LDL 171 HTN controlled.       Follow up in 6 months   Medication Adjustments/Labs and Tests Ordered: Current medicines are reviewed at length with the patient today.  Concerns regarding medicines are outlined above.  Orders Placed This Encounter  Procedures   EKG 12-Lead   Meds ordered this encounter  Medications   bisoprolol  (ZEBETA ) 5 MG tablet    Sig: Take 0.5 tablets (2.5 mg total) by mouth  daily.    Dispense:  45 tablet    Refill:  2    There are no Patient Instructions on file for this visit.   Signed, Alexxis Mackert, MD  08/25/2023 5:04 PM    [facility]

## 2023-08-25 ENCOUNTER — Encounter: Payer: Self-pay | Admitting: Cardiology

## 2023-08-25 ENCOUNTER — Ambulatory Visit: Payer: Medicare Other | Attending: Cardiology | Admitting: Cardiology

## 2023-08-25 VITALS — BP 106/64 | HR 77 | Ht 62.0 in | Wt 122.0 lb

## 2023-08-25 DIAGNOSIS — I5042 Chronic combined systolic (congestive) and diastolic (congestive) heart failure: Secondary | ICD-10-CM | POA: Insufficient documentation

## 2023-08-25 DIAGNOSIS — I10 Essential (primary) hypertension: Secondary | ICD-10-CM | POA: Insufficient documentation

## 2023-08-25 DIAGNOSIS — E785 Hyperlipidemia, unspecified: Secondary | ICD-10-CM | POA: Diagnosis not present

## 2023-08-25 DIAGNOSIS — I2510 Atherosclerotic heart disease of native coronary artery without angina pectoris: Secondary | ICD-10-CM | POA: Diagnosis present

## 2023-08-25 DIAGNOSIS — Z79899 Other long term (current) drug therapy: Secondary | ICD-10-CM | POA: Diagnosis present

## 2023-08-25 MED ORDER — BISOPROLOL FUMARATE 5 MG PO TABS: 2.5000 mg | ORAL_TABLET | Freq: Every day | ORAL | 2 refills | Status: DC

## 2023-08-25 NOTE — Patient Instructions (Signed)
 Medication Instructions:  Continue same medications *If you need a refill on your cardiac medications before your next appointment, please call your pharmacy*   Lab Work: None ordered   Testing/Procedures: None ordered   Follow-Up: At [facility], you and your health needs are our priority.  As part of our continuing mission to provide you with exceptional heart care, we have created designated Provider Care Teams.  These Care Teams include your primary Cardiologist (physician) and Advanced Practice Providers (APPs -  Physician Assistants and Nurse Practitioners) who all work together to provide you with the care you need, when you need it.  We recommend signing up for the patient portal called "MyChart".  Sign up information is provided on this After Visit Summary.  MyChart is used to connect with patients for Virtual Visits (Telemedicine).  Patients are able to view lab/test results, encounter notes, upcoming appointments, etc.  Non-urgent messages can be sent to your provider as well.   To learn more about what you can do with MyChart, go to [email or web address].    Your next appointment:  6 months    Call in June to schedule August appointment     Provider:  Dr.Jordan

## 2024-01-18 ENCOUNTER — Other Ambulatory Visit: Payer: Self-pay | Admitting: Cardiology

## 2024-01-18 DIAGNOSIS — E785 Hyperlipidemia, unspecified: Secondary | ICD-10-CM

## 2024-01-18 DIAGNOSIS — I2510 Atherosclerotic heart disease of native coronary artery without angina pectoris: Secondary | ICD-10-CM

## 2024-01-18 DIAGNOSIS — Z79899 Other long term (current) drug therapy: Secondary | ICD-10-CM

## 2024-01-18 DIAGNOSIS — I5042 Chronic combined systolic (congestive) and diastolic (congestive) heart failure: Secondary | ICD-10-CM

## 2024-01-25 ENCOUNTER — Ambulatory Visit
Admission: RE | Admit: 2024-01-25 | Discharge: 2024-01-25 | Disposition: A | Discharge status: Home / Self Care | Source: Ambulatory Visit | Attending: Orthopedic Surgery | Admitting: Orthopedic Surgery

## 2024-01-25 ENCOUNTER — Other Ambulatory Visit: Payer: Self-pay | Admitting: Orthopedic Surgery

## 2024-01-25 DIAGNOSIS — M1611 Unilateral primary osteoarthritis, right hip: Secondary | ICD-10-CM

## 2024-02-12 ENCOUNTER — Other Ambulatory Visit: Payer: Self-pay | Admitting: Cardiology

## 2024-02-12 DIAGNOSIS — Z79899 Other long term (current) drug therapy: Secondary | ICD-10-CM

## 2024-02-12 DIAGNOSIS — E785 Hyperlipidemia, unspecified: Secondary | ICD-10-CM

## 2024-02-12 DIAGNOSIS — I5042 Chronic combined systolic (congestive) and diastolic (congestive) heart failure: Secondary | ICD-10-CM

## 2024-02-12 DIAGNOSIS — I2510 Atherosclerotic heart disease of native coronary artery without angina pectoris: Secondary | ICD-10-CM

## 2024-03-14 NOTE — Progress Notes (Unsigned)
 Cardiology Office Note:    Date:  03/17/2024   ID:  Patricia Garcia, DOB 05/12/1933, MRN [id number]  PCP:  Ileen Rosaline NOVAK, NP   [facility] Providers Cardiologist:  Powell FORBES Sorrow, MD (Inactive) Cardiology APP:  Lelon Glendia ONEIDA DEVONNA     Referring MD: Ileen Rosaline NOVAK, NP   Chief Complaint  Patient presents with   Coronary Artery Disease    History of Present Illness:    Patricia Garcia is a 89 y.o. female seen for follow  up CAD.   The patient suffered a mechanical fall resulting in hip fracture requiring surgery on 05/02/20.  She was readmitted to [facility] 05/06/20 with nausea, vomiting, and hypoxia.  She was ultimately found to have a NSTEMI (trops in 3k range) and acute on chronic diastolic heart failure.  She was treated with IV Lasix .  TTE 05/07/2020 showed hypokinesis of the basal inferior and inferolateral wall, EF 50 to 55%, grade 2 diastolic dysfunction, elevated left atrial pressure, mild mitral regurgitation.  She underwent cardiac cath 05/08/2020 showing three-vessel obstructive coronary artery disease as outlined below ultimately received DES to mid RCA and first diagonal.  Hospitalization was also notable for anemia requiring blood transfusion, hypokalemia, gait instability, hypokalemia, hypoalbuminemia, mild AKI, and nocturnal confusion.   She was seen in  ED 11/2020 with nausea, vomiting, shortness of breath. Chest x-ray with small bilateral pleural effusions.  BNP elevated 1701.  Troponin negative x2.  She was admitted for CHF exacerbation.  Echo 12/07/2020 with LVEF 25-30%, mildly reduced RV systolic function.  Right and left heart cath 12/10/2020 with patent prior placement in RCA and first diagonal and otherwise stable CAD, mildly elevated right heart pressures.  She was diuresed from 123 to 118 pounds.   She was seen in the ED 03/13/21 with chest pain.  Her hs-Trops were neg (13>>13).  CXR was unremarkable.  EKG showed old LBBB.  Labs: Hgb  13.2, BNP 398, SCr 0.95, K+4.1, ALP 122, ALT 19.  She was DC home and saw Glendia Lelon in follow-up. She was started on imdur  at that time, but later stopped it because she stated she could not tolerate it. Repeat TTE 04/2021 showed improvement of LVEF 40-45%.   On follow up today she is doing very well. She lives alone. Denies any chest pain, palpitations, dizziness or edema. No dyspnea.    Past Medical History:  Diagnosis Date   Anal or rectal pain    Anemia    Arthritis    CAD (coronary artery disease)    a. NSTEMI 04/2020 s/p DES to [facility] and D1.   Depression    Diverticulosis of colon (without mention of hemorrhage)    HFrEF (heart failure with reduced ejection fraction) (HCC)    Echo 5/22: EF 25-30 // Echocardiogram 10/22: EF 40-45, mild LVH, Gr 1 DD, inf and inf-sept HK, normal RVSF, mild LAE, mild MR   Hypertension    Hypoalbuminemia    Internal hemorrhoids without mention of complication    Ischemic heart disease    LBBB (left bundle branch block)    Osteopenia    Renal insufficiency    S/P right hip fracture    Slow transit constipation     Past Surgical History:  Procedure Laterality Date   ABDOMINAL HYSTERECTOMY     CARDIAC CATHETERIZATION     CATARACT EXTRACTION, BILATERAL     CORONARY STENT INTERVENTION N/A 05/08/2020   Procedure: CORONARY STENT INTERVENTION;  Surgeon: [place], Nimo Verastegui M,  MD;  Location: [facility] INVASIVE CV LAB;  Service: Cardiovascular;  Laterality: N/A;   LEFT HEART CATH AND CORONARY ANGIOGRAPHY N/A 05/08/2020   Procedure: LEFT HEART CATH AND CORONARY ANGIOGRAPHY;  Surgeon: [place], Xsavier Seeley M, MD;  Location: [facility] INVASIVE CV LAB;  Service: Cardiovascular;  Laterality: N/A;   MASS EXCISION  06/10/2011   Procedure: EXCISION MASS;  Surgeon: Arley JONELLE Curia, MD;  Location: [facility];  Service: Orthopedics;  Laterality: Right;  excision cyst right thumb IP, debridement IP right thumb   RIGHT/LEFT HEART CATH AND CORONARY ANGIOGRAPHY N/A 12/10/2020    Procedure: RIGHT/LEFT HEART CATH AND CORONARY ANGIOGRAPHY;  Surgeon: Burnard Debby LABOR, MD;  Location: [facility] INVASIVE CV LAB;  Service: Cardiovascular;  Laterality: N/A;   TONSILLECTOMY     TOTAL HIP ARTHROPLASTY Right 05/02/2020   Procedure: TOTAL HIP ARTHROPLASTY ANTERIOR APPROACH;  Surgeon: Fidel Rogue, MD;  Location: [facility];  Service: Orthopedics;  Laterality: Right;    Current Medications: Current Meds  Medication Sig   Acetaminophen  (TYLENOL ) 325 MG CAPS 2 tabs as needed Orally every 6 hrs   ALPRAZolam  (XANAX ) 0.25 MG tablet 1 tablet as needed Orally daily   bisoprolol  (ZEBETA ) 5 MG tablet Take 0.5 tablets (2.5 mg total) by mouth daily.   Calcium Carbonate-Vit D-Min (CALCIUM 1200 PO) Take 1,200 mg by mouth daily.   Cholecalciferol  (VITAMIN D -3 PO) Take 1 capsule by mouth daily after supper. Per Patient taking 1,000 units daily   clopidogrel  (PLAVIX ) 75 MG tablet Take 1 tablet (75 mg total) by mouth daily.   diclofenac Sodium (VOLTAREN) 1 % GEL Apply topically as needed. Taking 3 to 5 days   docusate sodium  (COLACE) 100 MG capsule Take 100 mg by mouth in the morning.   furosemide  (LASIX ) 20 MG tablet Take 1 tablet (20 mg total) by mouth daily.   latanoprost  (XALATAN ) 0.005 % ophthalmic solution Place 1 drop into both eyes at bedtime.   Multiple Vitamins-Minerals (MULTIVITAMIN ADULTS 50+) TABS Take 1 tablet by mouth daily with breakfast.   nitroGLYCERIN  (NITROSTAT ) 0.4 MG SL tablet Place 0.4 mg under the tongue every 5 (five) minutes as needed.   Omega-3 Fatty Acids (FISH OIL BURP-LESS) 1200 MG CAPS 1 capsule Orally Twice a day   pantoprazole  (PROTONIX ) 40 MG tablet TAKE ONE TABLET BY MOUTH ONCE DAILY   Polyethyl Glycol-Propyl Glycol (SYSTANE) 0.4-0.3 % SOLN as directed Ophthalmic   spironolactone  (ALDACTONE ) 25 MG tablet Take 0.5 tablet (12.5 mg total) by mouth daily.     Allergies:   Imdur  [isosorbide  nitrate], Alendronate sodium, Doxycycline , Eucalyptus oil, Linaclotide , Medrol   [methylprednisolone ], Penicillamine, Penicillins, Prednisone, Risedronate, Statins, Statins support [a-g pro], Statins support [acid blockers support], Sulfa antibiotics, Sulfonamide derivatives, Tramadol, Latex, and Tape   Social History   Socioeconomic History   Marital status: Widowed    Spouse name: Not on file   Number of children: 2   Years of education: 12   Highest education level: High school graduate  Occupational History   Occupation: Retired    Associate Professor: RETIRED  Tobacco Use   Smoking status: Former    Current packs/day: 0.00    Types: Cigarettes    Quit date: 06/05/1984    Years since quitting: 39.8   Smokeless tobacco: Never  Vaping Use   Vaping status: Never Used  Substance and Sexual Activity   Alcohol  use: No   Drug use: No   Sexual activity: Not on file  Other Topics Concern   Not on file  Social History  Narrative   Son mark local    Daughter out of town in [place]    Member of the choral society -Ecologist   Social Drivers of Health   Financial Resource Strain: Low Risk  (08/06/2021)   Overall Financial Resource Strain (CARDIA)    Difficulty of Paying Living Expenses: Not hard at all  Food Insecurity: Low Risk  (10/20/2023)   Received from [facility]   Hunger Vital Sign    Within the past 12 months, you worried that your food would run out before you got money to buy more: Never true    Within the past 12 months, the food you bought just didn't last and you didn't have money to get more. : Never true  Transportation Needs: No Transportation Needs (10/20/2023)   Received from [facility]    In the past 12 months, has lack of reliable transportation kept you from medical appointments, meetings, work or from getting things needed for daily living? : No  Physical Activity: Insufficiently Active (10/25/2020)   Exercise Vital Sign    Days of Exercise per Week: 2 days    Minutes of Exercise per Session: 20 min  Stress: No Stress  Concern Present (08/06/2021)   [employer] of Occupational Health - Occupational Stress Questionnaire    Feeling of Stress : Only a little  Social Connections: Moderately Integrated (10/25/2020)   Social Connection and Isolation Panel    Frequency of Communication with Friends and Family: More than three times a week    Frequency of Social Gatherings with Friends and Family: More than three times a week    Attends Religious Services: 1 to 4 times per year    Active Member of [employer] or Organizations: Yes    Attends Banker Meetings: 1 to 4 times per year    Marital Status: Widowed     Family History: The patient's family history includes Cancer in her father; Colon polyps in her mother; Heart disease in her father.  ROS:   Please see the history of present illness.     All other systems reviewed and are negative.  EKGs/Labs/Other Studies Reviewed:    The following studies were reviewed today:       Cardiac cath 12/10/20:  RIGHT/LEFT HEART CATH AND CORONARY ANGIOGRAPHY   Conclusion    Prox Cx to Mid Cx lesion is 85% stenosed. Ramus lesion is 30% stenosed. Previously placed 1st Diag stent (unknown type) is widely patent. Mid LAD to Dist LAD lesion is 60% stenosed. Mid LAD lesion is 40% stenosed. Dist RCA lesion is 60% stenosed. Mid RCA lesion is 30% stenosed. Previously placed Prox RCA stent (unknown type) is widely patent.   Widely patent stents in the high diagonal and proximal RCA inserted in October 2021.   The LAD has tortuosity in its mid segment with 40 and 60% narrowing.  This is not significantly changed from the prior angiogram.   Mild disease in the ramus immediate vessel.   Previously noted very small AV groove circumflex with diffuse narrowing of 80%.   Dominant RCA with patent proximal stent and mild 30% mid stenosis with 50 to 60% narrowing before the PDA takeoff.   Mild right heart pressure elevation pulmonary hypertension.    RECOMMENDATION: Guideline directed medical therapy for heart failure with reduced EF on recent echo at 25 to 30%.  Medical therapy for concomitant CAD.  Diagnostic Dominance: Right  Intervention  Echo 05/15/21: IMPRESSIONS  1. Left ventricular ejection fraction, by estimation, is 45 to 50%. The  left ventricle has mildly decreased function. The left ventricle  demonstrates global hypokinesis with septal-lateral dyssynchrony  consistent with LBBB. Left ventricular diastolic  parameters are consistent with Grade I diastolic dysfunction (impaired  relaxation).   2. Right ventricular systolic function is normal. The right ventricular  size is normal. There is normal pulmonary artery systolic pressure. The  estimated right ventricular systolic pressure is 21.1 mmHg.   3. The mitral valve is normal in structure. Mild mitral valve  regurgitation. No evidence of mitral stenosis.   4. The aortic valve is tricuspid. There is mild calcification of the  aortic valve. Aortic valve regurgitation is not visualized. No aortic  stenosis is present.   5. The inferior vena cava is normal in size with greater than 50%  respiratory variability, suggesting right atrial pressure of 3 mmHg.   EKG:  EKG is not ordered today.    Recent Labs: No results found for requested labs within last 365 days.  Recent Lipid Panel    Component Value Date/Time   CHOL 251 (H) 11/26/2021 0916   TRIG 203 (H) 11/26/2021 0916   HDL 42 11/26/2021 0916   CHOLHDL 6.0 (H) 11/26/2021 0916   CHOLHDL 4.5 12/10/2020 0511   VLDL 29 12/10/2020 0511   LDLCALC 171 (H) 11/26/2021 0916     Risk Assessment/Calculations:       Physical Exam:    VS:  BP 128/76   Pulse 82   Ht 5' 3.5 (1.613 m)   Wt 129 lb (58.5 kg)   SpO2 98%   BMI 22.49 kg/m     Wt Readings from Last 3 Encounters:  03/17/24 129 lb (58.5 kg)  08/25/23 122 lb (55.3 kg)  01/29/23 122 lb 6.4 oz (55.5 kg)     GEN:  Well nourished, well developed  in no acute distress HEENT: Normal NECK: No JVD; No carotid bruits LYMPHATICS: No lymphadenopathy CARDIAC: RRR, no murmurs, rubs, gallops RESPIRATORY:  Clear to auscultation without rales, wheezing or rhonchi  ABDOMEN: Soft, non-tender, non-distended MUSCULOSKELETAL:  No edema; No deformity  SKIN: Warm and dry NEUROLOGIC:  Alert and oriented x 3 PSYCHIATRIC:  Normal affect   ASSESSMENT:    No diagnosis found.    PLAN:    In order of problems listed above:  CAD s/p stenting of RCA and diagonal in 2021. No significant angina. Continue Plavix  monotherapy and Zbeta. Intolerant of statins and nitrates Chronic combined systolic/diastolic CHF. Last EF 40-45%. Euvolemic. Low salt diet. Did not tolerate ARB in past. Declined SGLT 2 inhibitor LBBB chronic Hyperlipidemia. Statin intolerant. Previously has declined further Rx. Last LDL 171. Will update labs today. I discussed additional therapy with her today and she is receptive to Repatha. Will have her see Pharm D HTN controlled.       Follow up in 6 months   Medication Adjustments/Labs and Tests Ordered: Current medicines are reviewed at length with the patient today.  Concerns regarding medicines are outlined above.  No orders of the defined types were placed in this encounter.  No orders of the defined types were placed in this encounter.   There are no Patient Instructions on file for this visit.   Signed, Haelie Clapp [place], MD  03/17/2024 11:19 AM    [facility]

## 2024-03-17 ENCOUNTER — Encounter: Payer: Self-pay | Admitting: Cardiology

## 2024-03-17 ENCOUNTER — Ambulatory Visit: Attending: Cardiology | Admitting: Cardiology

## 2024-03-17 VITALS — BP 128/76 | HR 82 | Ht 63.5 in | Wt 129.0 lb

## 2024-03-17 DIAGNOSIS — I5042 Chronic combined systolic (congestive) and diastolic (congestive) heart failure: Secondary | ICD-10-CM | POA: Diagnosis present

## 2024-03-17 DIAGNOSIS — I447 Left bundle-branch block, unspecified: Secondary | ICD-10-CM | POA: Diagnosis present

## 2024-03-17 DIAGNOSIS — E785 Hyperlipidemia, unspecified: Secondary | ICD-10-CM | POA: Diagnosis present

## 2024-03-17 DIAGNOSIS — I25119 Atherosclerotic heart disease of native coronary artery with unspecified angina pectoris: Secondary | ICD-10-CM | POA: Insufficient documentation

## 2024-03-17 LAB — LIPID PANEL

## 2024-03-17 NOTE — Patient Instructions (Signed)
 Medication Instructions:  Continue all medications *If you need a refill on your cardiac medications before your next appointment, please call your pharmacy*  Lab Work: Cmet,lipid panel,cbc today  Testing/Procedures: None ordered  Follow-Up: At [facility], you and your health needs are our priority.  As part of our continuing mission to provide you with exceptional heart care, our providers are all part of one team.  This team includes your primary Cardiologist (physician) and Advanced Practice Providers or APPs (Physician Assistants and Nurse Practitioners) who all work together to provide you with the care you need, when you need it.  Your next appointment:  6 months   Call in Oct to schedule Feb appointment     Provider:  Dr.Jordan   Schedule appointment with Pharm D in Lipid Clinic   We recommend signing up for the patient portal called MyChart.  Sign up information is provided on this After Visit Summary.  MyChart is used to connect with patients for Virtual Visits (Telemedicine).  Patients are able to view lab/test results, encounter notes, upcoming appointments, etc.  Non-urgent messages can be sent to your provider as well.   To learn more about what you can do with MyChart, go to [email or web address].

## 2024-03-18 ENCOUNTER — Ambulatory Visit: Payer: Self-pay | Admitting: Cardiology

## 2024-03-18 LAB — CBC WITH DIFFERENTIAL/PLATELET
Basophils Absolute: 0.1 x10E3/uL (ref 0.0–0.2)
Basos: 2 %
EOS (ABSOLUTE): 0.3 x10E3/uL (ref 0.0–0.4)
Eos: 4 %
Hematocrit: 34.5 % (ref 34.0–46.6)
Hemoglobin: 11.2 g/dL (ref 11.1–15.9)
Immature Grans (Abs): 0 x10E3/uL (ref 0.0–0.1)
Immature Granulocytes: 0 %
Lymphocytes Absolute: 2 x10E3/uL (ref 0.7–3.1)
Lymphs: 34 %
MCH: 28.4 pg (ref 26.6–33.0)
MCHC: 32.5 g/dL (ref 31.5–35.7)
MCV: 87 fL (ref 79–97)
Monocytes Absolute: 0.5 x10E3/uL (ref 0.1–0.9)
Monocytes: 9 %
Neutrophils Absolute: 2.9 x10E3/uL (ref 1.4–7.0)
Neutrophils: 51 %
Platelets: 286 x10E3/uL (ref 150–450)
RBC: 3.95 x10E6/uL (ref 3.77–5.28)
RDW: 12.4 % (ref 11.7–15.4)
WBC: 5.8 x10E3/uL (ref 3.4–10.8)

## 2024-03-18 LAB — COMPREHENSIVE METABOLIC PANEL WITH GFR
ALT: 14 IU/L (ref 0–32)
AST: 34 IU/L (ref 0–40)
Albumin: 4.5 g/dL (ref 3.7–4.7)
Alkaline Phosphatase: 65 IU/L (ref 44–121)
BUN/Creatinine Ratio: 15 (ref 12–28)
BUN: 17 mg/dL (ref 8–27)
Bilirubin Total: 0.3 mg/dL (ref 0.0–1.2)
CO2: 16 mmol/L — AB (ref 20–29)
Calcium: 10 mg/dL (ref 8.7–10.3)
Chloride: 95 mmol/L — AB (ref 96–106)
Creatinine, Ser: 1.16 mg/dL — AB (ref 0.57–1.00)
Globulin, Total: 2.8 g/dL (ref 1.5–4.5)
Glucose: 109 mg/dL — AB (ref 70–99)
Potassium: 4.6 mmol/L (ref 3.5–5.2)
Sodium: 135 mmol/L (ref 134–144)
Total Protein: 7.3 g/dL (ref 6.0–8.5)
eGFR: 45 mL/min/1.73 — AB (ref >59)

## 2024-03-18 LAB — LIPID PANEL
Cholesterol, Total: 235 mg/dL — AB (ref 100–199)
HDL: 45 mg/dL (ref >39)
LDL CALC COMMENT:: 5.2 ratio — AB (ref 0.0–4.4)
LDL Chol Calc (NIH): 149 mg/dL — AB (ref 0–99)
Triglycerides: 227 mg/dL — AB (ref 0–149)
VLDL Cholesterol Cal: 41 mg/dL — AB (ref 5–40)

## 2024-04-21 ENCOUNTER — Ambulatory Visit: Attending: Internal Medicine | Admitting: Pharmacist

## 2024-04-21 DIAGNOSIS — E785 Hyperlipidemia, unspecified: Secondary | ICD-10-CM | POA: Insufficient documentation

## 2024-04-21 MED ORDER — REPATHA SURECLICK 140 MG/ML ~~LOC~~ SOAJ: 1.0000 mL | SUBCUTANEOUS | 11 refills | Status: DC

## 2024-04-21 NOTE — Patient Instructions (Addendum)
 Hyperlipidemia Foods high in saturated fat tend to increase LDL (bad) cholesterol the most.  Not all fat is bad fat! Foods higher in unsaturated fat are healthy, like fish, nuts, and avocadoes. Overall, following a diet like the Mediterranean diet can help to improve your cholesterol. Hypertriglyceridemia Foods high in carbohydrates and sugar, as well as alcohol , can increase your triglycerides. If you are diabetic, poorly controlled blood sugar can also increase your triglycerides. A non-fasting state can affect the triglyceride level in your lab work. Please make sure you are fasting to improve accuracy of this lab test.  Eat more of these Eat less of these  Carbohydrates Fiber-rich whole grains: oats, whole wheat pasta or bread, quinoa, barley, oats and brown rice Aim for  of your plate to be whole grains Men: aim for > 38 grams of fiber per day Women: aim for > 25 grams of fiber per day Refined grains: white bread, rice, or pasta, macaroni and cheese Foods with added sugar Processed foods: desserts like cake, cookies, donuts, muffins, and pastries; microwave meals, chips, [place] fries  Fruits and vegetables A variety of bright colored fruits and vegetables: spinach, broccoli, tomatoes, carrots, berries,  oranges, apples, bananas, berries, and melon Aim for  of your plate to be fruits/vegetables Canned vegetables Starchy vegetables like potatoes Canned fruit in heavy syrup  Protein Lean meat: skinless chicken or [place] Fish: salmon, trout, tuna, cod, tilapia, flounder, etc Legumes: beans, lentils, chickpeas, tofu, nuts Aim for  of your plate to be protein Red, fatty, or fried meat Processed foods: deli meat, hot dogs, burgers, pizza, fast food   Dairy, fats and oils Unsaturated fats: fish, nuts, and avocadoes  Low fat or fat free milk or yogurt Olive and canola oil Saturated fats: butter, lard, cream, coconut oil Whole milk and other full fat dairy products like  cheese Sugar-sweetened dairy products (many yogurts have added sugar)  Drinks Water : plain or sparkling Sugar free or diet drinks Unsweet tea or coffee Keep added sugar intake to  < 6 teaspoons (24 grams) Regular soda Fruit juice Alcohol   Other ways to adopt a healthy lifestyle:  Exercise:  Exercise: Aim for 150 min of moderate intensity exercise weekly for heart health, and weights twice weekly for bone health. Stay active - any steps are better than no steps!  Sleep: Aim for 7-9 hours of sleep nightly.  Weight: Know what a healthy weight is for you (roughly BMI <25) and aim to maintain this. Unfortunately, this is not the most accurate measure of healthy weight, but it is the simplest measurement to use. A more accurate measurement involves body scanning which measures lean muscle, fat tissue and bony density. We do not have this equipment at [facility].  Repatha is a cholesterol medication that improved your body's ability to get rid of bad cholesterol known as LDL. It can lower your LDL up to 60%! It is an injection that is given under the skin every 2 weeks. The medication often requires a prior authorization from your insurance company. We will take care of submitting all the necessary information to your insurance company to get it approved.  Instructions for injection:  Remove Pen from the refrigerator at least 30 minutes prior to injecting. Allow to come to room temperature. Do NOT try to warm the pen using a heat source, Do NOT leave in direct sunlight, Do NOT shake Inspect pen. Do not use if pen is expired, cracked, medication is cloudy or discolored or if orange cap  is missing Clean the injection site with an alcohol  swab or soapy water  and dry completely. Always rotate injection sites. Do not use the same site as your previous injection. Do not inject in an area that is tender, bruised, red or hard.   Pull the orange cap off when you are ready to inject. Do not leave cap off for  more than 5 minutes. Do not put the cap back on. Create a firm surface area by either using the stretch method or the pinch method Keep holding the stretched or pinched skin. Put the yellow safety guard on your skin at a 90 degree angle.  7. Firmly push down the pen onto the skin until it stops moving. When you are ready to inject push the gray button, you will hear a click.   8. Keep pushing the pen down on your skin until you hear the SECOND click (could take about 15 seconds). You then may remove the pen. The window on the view finder should be yellow. If it has not turned yellow, then all of the medication was not injected. 9. Dispose of the pen in a sharps container. If you do not have a sharps container you may use a container that is made of heavy plastic (such as a laundry detergent container that has a puncture proof lid). Label it with a marker as hazardous waste   Visit https://www.pi.amgen.com/~/media/amgen/[email or web address]-amgen-com/repatha/repatha_ifu_hcp_pt_english.pdf for more detailed instructions

## 2024-04-21 NOTE — Assessment & Plan Note (Signed)
 Assessment: LDL-C above goal of less than 70 Patient intolerant to statins Patient more comfortable with PCSK9 inhibitor since it has been out longer despite being informed that it would cost around $600 a month Reviewed Leqvio and how it would be covered 100% by her insurance  Plan: Patient wishes to pay full price for PCSK9 Encouraged her to look into part D coverage for next year Repeat labs in 3 months

## 2024-04-21 NOTE — Progress Notes (Signed)
 Patient ID: EVAGELIA Garcia                 DOB: 1932-11-29                    MRN: [id number]      HPI: LADA Patricia Garcia is a 89 y.o. female patient referred to lipid clinic by Dr. Swaziland. PMH is significant for NSTEMI (She underwent cardiac cath 05/08/2020 showing three-vessel obstructive coronary artery disease as outlined below ultimately received DES to mid RCA and first diagonal), CHF.  Patient with a history of statin intolerance.  Referred to lipid clinic to discuss alternatives.  Patient presents today to lipid clinic.  She reports shakiness with statins.  Does not remember what statin she has tried in the past.  Of note she has a Medicare supplement but does not have a part D plan.  Potentially was on ezetimibe  previous but patient does not remember this.  We discussed PCSK9 (prior to discovering patient does not have prescription coverage) and Leqvio.  We discussed clinical benefit of both options.  Injection technique reviewed.   Current Medications: none Intolerances: Statins (shakiness) Risk Factors: CAD, age, CHF LDL-C goal: Less than 55  Diet: Not discussed today  Exercise: physical therapy, walks   Family History:  Family History  Problem Relation Age of Onset   Colon polyps Mother    Cancer Father        bladder   Heart disease Father      Social History: no tobacco, no ETOH  Labs: Lipid Panel     Component Value Date/Time   CHOL 235 (H) 03/17/2024 1215   TRIG 227 (H) 03/17/2024 1215   HDL 45 03/17/2024 1215   CHOLHDL 5.2 (H) 03/17/2024 1215   CHOLHDL 4.5 12/10/2020 0511   VLDL 29 12/10/2020 0511   LDLCALC 149 (H) 03/17/2024 1215   LABVLDL 41 (H) 03/17/2024 1215    Past Medical History:  Diagnosis Date   Anal or rectal pain    Anemia    Arthritis    CAD (coronary artery disease)    a. NSTEMI 04/2020 s/p DES to [facility] and D1.   Depression    Diverticulosis of colon (without mention of hemorrhage)    HFrEF (heart failure with reduced ejection  fraction) (HCC)    Echo 5/22: EF 25-30 // Echocardiogram 10/22: EF 40-45, mild LVH, Gr 1 DD, inf and inf-sept HK, normal RVSF, mild LAE, mild MR   Hypertension    Hypoalbuminemia    Internal hemorrhoids without mention of complication    Ischemic heart disease    LBBB (left bundle branch block)    Osteopenia    Renal insufficiency    S/P right hip fracture    Slow transit constipation     Current Outpatient Medications on File Prior to Visit  Medication Sig Dispense Refill   Acetaminophen  (TYLENOL ) 325 MG CAPS 2 tabs as needed Orally every 6 hrs     ALPRAZolam  (XANAX ) 0.25 MG tablet 1 tablet as needed Orally daily     bisoprolol  (ZEBETA ) 5 MG tablet Take 0.5 tablets (2.5 mg total) by mouth daily. 45 tablet 2   Cholecalciferol  (VITAMIN D -3 PO) Take 1 capsule by mouth daily after supper. Per Patient taking 1,000 units daily     clopidogrel  (PLAVIX ) 75 MG tablet Take 1 tablet (75 mg total) by mouth daily. 90 tablet 1   docusate sodium  (COLACE) 100 MG capsule Take 100 mg by mouth in the  morning.     furosemide  (LASIX ) 20 MG tablet Take 1 tablet (20 mg total) by mouth daily. 90 tablet 1   latanoprost  (XALATAN ) 0.005 % ophthalmic solution Place 1 drop into both eyes at bedtime.     Multiple Vitamins-Minerals (MULTIVITAMIN ADULTS 50+) TABS Take 1 tablet by mouth daily with breakfast.     Omega-3 Fatty Acids (FISH OIL BURP-LESS) 1200 MG CAPS 1 capsule Orally Twice a day     pantoprazole  (PROTONIX ) 40 MG tablet TAKE ONE TABLET BY MOUTH ONCE DAILY 90 tablet 1   Polyethyl Glycol-Propyl Glycol (SYSTANE) 0.4-0.3 % SOLN as directed Ophthalmic     spironolactone  (ALDACTONE ) 25 MG tablet Take 0.5 tablet (12.5 mg total) by mouth daily. 45 tablet 1   diclofenac Sodium (VOLTAREN) 1 % GEL Apply topically as needed. Taking 3 to 5 days     nitroGLYCERIN  (NITROSTAT ) 0.4 MG SL tablet Place 0.4 mg under the tongue every 5 (five) minutes as needed.     No current facility-administered medications on file prior  to visit.    Allergies  Allergen Reactions   Imdur  [Isosorbide  Nitrate] Nausea Only   Alendronate Sodium Other (See Comments)    GI symptoms   Doxycycline  Nausea And Vomiting    12/04/20 Pt prefers not to receive Doxycycline  for infections   Eucalyptus Oil Other (See Comments)    Affects the sinus cavity- causes congestion   Linaclotide  Other (See Comments)    Reaction not recalled   Medrol  [Methylprednisolone ] Other (See Comments)    Nervousness and cramps in both feet/ankles   Penicillamine Other (See Comments)    Reaction not recalled- was years ago   Penicillins Other (See Comments)    Reaction not recalled- was years ago   Prednisone Other (See Comments)    Irritable and shaking   Risedronate Other (See Comments)    Heartburn   Statins Other (See Comments)    Shaking   Statins Support [A-G Pro] Other (See Comments)    Visual changes, general aches   Statins Support [Acid Blockers Support] Other (See Comments)    Visual changes, general aches   Sulfa Antibiotics Other (See Comments)    Reaction not recalled   Sulfonamide Derivatives Other (See Comments)    Reaction not recalled   Tramadol Other (See Comments)    Reaction not recalled   Latex Rash   Tape Rash and Other (See Comments)    EKG LEANS MUST BE THE SENSITIVE ONES- otherwise, a rash occurs    Assessment/Plan:  1. Hyperlipidemia -  Hyperlipidemia LDL goal <70 Assessment: LDL-C above goal of less than 70 Patient intolerant to statins Patient more comfortable with PCSK9 inhibitor since it has been out longer despite being informed that it would cost around $600 a month Reviewed Leqvio and how it would be covered 100% by her insurance  Plan: Patient wishes to pay full price for PCSK9 Encouraged her to look into part D coverage for next year Repeat labs in 3 months    Thank you,  Tyisha Cressy D Affan Callow, Pharm.JONETTA SARAN, CPP [facility] A [facility] of [facility] [address] [place], [place] [address]  Phone: [phone]; Fax: [phone]

## 2024-05-03 ENCOUNTER — Other Ambulatory Visit: Payer: Self-pay | Admitting: Physical Medicine and Rehabilitation

## 2024-05-03 DIAGNOSIS — N200 Calculus of kidney: Secondary | ICD-10-CM

## 2024-05-04 ENCOUNTER — Emergency Department (HOSPITAL_COMMUNITY)
Admission: EM | Admit: 2024-05-04 | Discharge: 2024-05-04 | Disposition: A | Discharge status: Home / Self Care | Attending: Emergency Medicine | Admitting: Emergency Medicine

## 2024-05-04 ENCOUNTER — Emergency Department (HOSPITAL_COMMUNITY)

## 2024-05-04 ENCOUNTER — Other Ambulatory Visit: Payer: Self-pay

## 2024-05-04 ENCOUNTER — Encounter (HOSPITAL_COMMUNITY): Payer: Self-pay

## 2024-05-04 DIAGNOSIS — N132 Hydronephrosis with renal and ureteral calculous obstruction: Secondary | ICD-10-CM | POA: Insufficient documentation

## 2024-05-04 DIAGNOSIS — N1831 Chronic kidney disease, stage 3a: Secondary | ICD-10-CM | POA: Insufficient documentation

## 2024-05-04 DIAGNOSIS — K8080 Other cholelithiasis without obstruction: Secondary | ICD-10-CM | POA: Diagnosis not present

## 2024-05-04 DIAGNOSIS — I2510 Atherosclerotic heart disease of native coronary artery without angina pectoris: Secondary | ICD-10-CM | POA: Insufficient documentation

## 2024-05-04 DIAGNOSIS — N200 Calculus of kidney: Secondary | ICD-10-CM

## 2024-05-04 DIAGNOSIS — Z79899 Other long term (current) drug therapy: Secondary | ICD-10-CM | POA: Diagnosis not present

## 2024-05-04 DIAGNOSIS — Z91040 Latex allergy status: Secondary | ICD-10-CM | POA: Insufficient documentation

## 2024-05-04 DIAGNOSIS — Z7902 Long term (current) use of antithrombotics/antiplatelets: Secondary | ICD-10-CM | POA: Diagnosis not present

## 2024-05-04 DIAGNOSIS — Z96641 Presence of right artificial hip joint: Secondary | ICD-10-CM | POA: Diagnosis not present

## 2024-05-04 DIAGNOSIS — I5020 Unspecified systolic (congestive) heart failure: Secondary | ICD-10-CM | POA: Insufficient documentation

## 2024-05-04 DIAGNOSIS — K7689 Other specified diseases of liver: Secondary | ICD-10-CM | POA: Diagnosis not present

## 2024-05-04 DIAGNOSIS — Z87891 Personal history of nicotine dependence: Secondary | ICD-10-CM | POA: Insufficient documentation

## 2024-05-04 DIAGNOSIS — I130 Hypertensive heart and chronic kidney disease with heart failure and stage 1 through stage 4 chronic kidney disease, or unspecified chronic kidney disease: Secondary | ICD-10-CM | POA: Insufficient documentation

## 2024-05-04 DIAGNOSIS — R109 Unspecified abdominal pain: Secondary | ICD-10-CM

## 2024-05-04 DIAGNOSIS — R0789 Other chest pain: Secondary | ICD-10-CM | POA: Insufficient documentation

## 2024-05-04 DIAGNOSIS — K5730 Diverticulosis of large intestine without perforation or abscess without bleeding: Secondary | ICD-10-CM | POA: Insufficient documentation

## 2024-05-04 DIAGNOSIS — K5790 Diverticulosis of intestine, part unspecified, without perforation or abscess without bleeding: Secondary | ICD-10-CM

## 2024-05-04 LAB — COMPREHENSIVE METABOLIC PANEL WITH GFR
ALT: 13 U/L (ref 0–44)
AST: 34 U/L (ref 15–41)
Albumin: 3.9 g/dL (ref 3.5–5.0)
Alkaline Phosphatase: 48 U/L (ref 38–126)
Anion gap: 13 (ref 5–15)
BUN: 17 mg/dL (ref 8–23)
CO2: 21 mmol/L — ABNORMAL LOW (ref 22–32)
Calcium: 9.4 mg/dL (ref 8.9–10.3)
Chloride: 99 mmol/L (ref 98–111)
Creatinine, Ser: 1.11 mg/dL — ABNORMAL HIGH (ref 0.44–1.00)
GFR, Estimated: 48 mL/min — ABNORMAL LOW (ref >60)
Glucose, Bld: 110 mg/dL — ABNORMAL HIGH (ref 70–99)
Potassium: 4.2 mmol/L (ref 3.5–5.1)
Sodium: 133 mmol/L — ABNORMAL LOW (ref 135–145)
Total Bilirubin: 1 mg/dL (ref 0.0–1.2)
Total Protein: 7 g/dL (ref 6.5–8.1)

## 2024-05-04 LAB — URINALYSIS, ROUTINE W REFLEX MICROSCOPIC
Bilirubin Urine: NEGATIVE
Glucose, UA: NEGATIVE mg/dL
Hgb urine dipstick: NEGATIVE
Ketones, ur: NEGATIVE mg/dL
Leukocytes,Ua: NEGATIVE
Nitrite: NEGATIVE
Protein, ur: NEGATIVE mg/dL
Specific Gravity, Urine: 1.004 — ABNORMAL LOW (ref 1.005–1.030)
pH: 7 (ref 5.0–8.0)

## 2024-05-04 LAB — CBC WITH DIFFERENTIAL/PLATELET
Abs Immature Granulocytes: 0.03 K/uL (ref 0.00–0.07)
Basophils Absolute: 0.1 K/uL (ref 0.0–0.1)
Basophils Relative: 2 %
Eosinophils Absolute: 0.1 K/uL (ref 0.0–0.5)
Eosinophils Relative: 2 %
HCT: 35.7 % — ABNORMAL LOW (ref 36.0–46.0)
Hemoglobin: 12 g/dL (ref 12.0–15.0)
Immature Granulocytes: 0 %
Lymphocytes Relative: 25 %
Lymphs Abs: 1.8 K/uL (ref 0.7–4.0)
MCH: 28.8 pg (ref 26.0–34.0)
MCHC: 33.6 g/dL (ref 30.0–36.0)
MCV: 85.8 fL (ref 80.0–100.0)
Monocytes Absolute: 0.6 K/uL (ref 0.1–1.0)
Monocytes Relative: 9 %
Neutro Abs: 4.3 K/uL (ref 1.7–7.7)
Neutrophils Relative %: 62 %
Platelets: 292 K/uL (ref 150–400)
RBC: 4.16 MIL/uL (ref 3.87–5.11)
RDW: 12.5 % (ref 11.5–15.5)
WBC: 7 K/uL (ref 4.0–10.5)
nRBC: 0 % (ref 0.0–0.2)

## 2024-05-04 LAB — TROPONIN I (HIGH SENSITIVITY)
Troponin I (High Sensitivity): 10 ng/L (ref <18)
Troponin I (High Sensitivity): 14 ng/L (ref <18)

## 2024-05-04 MED ORDER — IOHEXOL 350 MG/ML SOLN
75.0000 mL | Freq: Once | INTRAVENOUS | Status: AC | PRN
  Administered 2024-05-04: 75 mL via INTRAVENOUS

## 2024-05-04 NOTE — ED Notes (Signed)
 PT discharged to home. Breathing is even and unlabored. PT educated on follow up. Will Follow up as directed. Daughter in law picking pt up.

## 2024-05-04 NOTE — Discharge Instructions (Addendum)
 It was a pleasure caring for you today in the emergency department.  Recommend you follow-up with PCP for MRI of your liver further evaluation of cyst noted in your liver.  Return to the Emergency Department if you have unusual chest pain, pressure, or discomfort, shortness of breath, nausea, vomiting, burping, heartburn, tingling upper body parts, sweating, cold, clammy skin, or racing heartbeat. Call 911 if you think you are having a heart attack. Take all medications as prescribed - notify your doctor if you have any side effects. Follow cardiac diet - avoid fatty & fried foods, don't eat too much red meat, eat lots of fruits & vegetables, and dairy products should be low fat. Please lose weight if you are overweight. Become more active with walking, gardening, or any other activity that gets you to moving.   Please return to the emergency department immediately for any new or concerning symptoms, or if you get worse. Please return to the emergency department for any worsening or worrisome symptoms.

## 2024-05-04 NOTE — ED Provider Notes (Signed)
 [place] [facility] AT [facility] Provider Note  CSN: [id number] Arrival date & time: 05/04/24 9077  Chief Complaint(s) Abdominal Pain and Neck Pain  HPI Patricia Garcia is a 89 y.o. female with past medical history as below, significant for CAD, HFrEF, renal insufficiency, mitral regurgitation, arthritis who presents to the ED with complaint of chest discomfort, abdominal discomfort  Patient reports her morning this morning she had a warm sensation to the left side of her chest and left arm that moved from her shoulders down to her abdomen.  No significant pain associate with this.  She had a dull ache to her perineum/gluteal area that she has had previously associated with prior fall.  Denies fall in the past few weeks.  She has been evaluated this problem through her primary care team and orthopedics.  Patient Warder symptoms have essentially resolved at this time.  She has no chest pain or palpitations, no dyspnea, no nausea or vomiting.  No change in bowel or bladder function.  Reports normal state of health when she went to bed last night.  She has been compliant with her medications.  Past Medical History Past Medical History:  Diagnosis Date   Anal or rectal pain    Anemia    Arthritis    CAD (coronary artery disease)    a. NSTEMI 04/2020 s/p DES to [facility] and D1.   Depression    Diverticulosis of colon (without mention of hemorrhage)    HFrEF (heart failure with reduced ejection fraction) (HCC)    Echo 5/22: EF 25-30 // Echocardiogram 10/22: EF 40-45, mild LVH, Gr 1 DD, inf and inf-sept HK, normal RVSF, mild LAE, mild MR   Hypertension    Hypoalbuminemia    Internal hemorrhoids without mention of complication    Ischemic heart disease    LBBB (left bundle branch block)    Osteopenia    Renal insufficiency    S/P right hip fracture    Slow transit constipation    Patient Active Problem List   Diagnosis Date Noted   Mitral regurgitation 05/22/2021    HFmrEF (heart failure with mildly reduced ejection fraction) (HCC) 05/01/2021   Respiratory failure with hypoxia (HCC) 12/06/2020   Nausea and vomiting 12/06/2020   Prolonged QT interval 12/06/2020   Hyponatremia 12/06/2020   Nephrolithiasis 12/06/2020   History of total hip arthroplasty 10/18/2020   Trochanteric bursitis of right hip 10/18/2020   Mild major depression, single episode 09/26/2020   Pain of right hip joint 08/07/2020   Allergic rhinitis due to pollen 08/02/2020   Coronary artery disease involving native coronary artery of native heart with angina pectoris 08/02/2020   Chronic kidney disease, stage 3a (HCC) 08/02/2020   Hyperlipidemia LDL goal <70 08/02/2020   Hardening of the aorta (main artery of the heart) 08/02/2020   Hearing loss 08/02/2020   Hypertensive renal disease 08/02/2020   Intestinal malabsorption 08/02/2020   Ischemic cardiomyopathy 08/02/2020   Primary localized osteoarthritis of pelvic region and thigh 08/02/2020   Spondylosis without myelopathy 08/02/2020   History of non-ST elevation myocardial infarction (NSTEMI) 05/07/2020   Postoperative anemia 05/07/2020   Acute respiratory failure with hypoxia (HCC) 05/06/2020   LBBB (left bundle branch block) 05/06/2020   Sinus congestion 05/06/2020   Femur fracture, right (HCC) 04/30/2020   Fracture of neck of femur (HCC) 04/30/2020   Chronic abdominal pain 09/23/2011   Gas pain 09/23/2011   Constipation, slow transit 09/23/2011   Anorectal pain 09/23/2011   Gastritis  and gastroduodenitis 06/26/2010   Slow transit constipation 06/21/2010   RECTAL BLEEDING 06/21/2010   Vitamin D  deficiency 06/20/2010   Generalized anxiety disorder 06/20/2010   Unspecified glaucoma 06/20/2010   Cataract 06/20/2010   Essential hypertension 06/20/2010   Chronic sinusitis 06/20/2010   GERD 06/20/2010   Disorder of skeletal system 06/20/2010   Abdominal pain 06/20/2010   MICROALBUMINURIA 06/20/2010   Home  Medication(s) Prior to Admission medications   Medication Sig Start Date End Date Taking? Authorizing Provider  Acetaminophen  (TYLENOL ) 325 MG CAPS 2 tabs as needed Orally every 6 hrs 01/19/21   [provider]  ALPRAZolam  (XANAX ) 0.25 MG tablet 1 tablet as needed Orally daily 04/22/22   [provider]  bisoprolol  (ZEBETA ) 5 MG tablet Take 0.5 tablets (2.5 mg total) by mouth daily. 08/25/23   Swaziland, Peter M, MD  Cholecalciferol  (VITAMIN D -3 PO) Take 1 capsule by mouth daily after supper. Per Patient taking 1,000 units daily    [provider]  clopidogrel  (PLAVIX ) 75 MG tablet Take 1 tablet (75 mg total) by mouth daily. 02/12/24   Swaziland, Peter M, MD  diclofenac Sodium (VOLTAREN) 1 % GEL Apply topically as needed. Taking 3 to 5 days    [provider]  docusate sodium  (COLACE) 100 MG capsule Take 100 mg by mouth in the morning.    [provider]  Evolocumab (REPATHA SURECLICK) 140 MG/ML SOAJ Inject 140 mg into the skin every 14 (fourteen) days. 04/21/24   Swaziland, Peter M, MD  furosemide  (LASIX ) 20 MG tablet Take 1 tablet (20 mg total) by mouth daily. 02/12/24   [place]il[place], Peter M, MD  latanoprost  (XALATAN ) 0.005 % ophthalmic solution Place 1 drop into both eyes at bedtime.    [provider]  Multiple Vitamins-Minerals (MULTIVITAMIN ADULTS 50+) TABS Take 1 tablet by mouth daily with breakfast.    [provider]  nitroGLYCERIN  (NITROSTAT ) 0.4 MG SL tablet Place 0.4 mg under the tongue every 5 (five) minutes as needed.    [provider]  Omega-3 Fatty Acids (FISH OIL BURP-LESS) 1200 MG CAPS 1 capsule Orally Twice a day 08/19/21   [provider]  pantoprazole  (PROTONIX ) 40 MG tablet TAKE ONE TABLET BY MOUTH ONCE DAILY 01/19/24   Swaziland, Peter M, MD  Polyethyl Glycol-Propyl Glycol (SYSTANE) 0.4-0.3 % SOLN as directed Ophthalmic    [provider]  spironolactone  (ALDACTONE ) 25 MG tablet Take 0.5 tablet (12.5 mg total) by  mouth daily. 02/12/24   Swaziland, Peter M, MD                                                                                                                                    Past Surgical History Past Surgical History:  Procedure Laterality Date   ABDOMINAL HYSTERECTOMY     CARDIAC CATHETERIZATION     CATARACT EXTRACTION, BILATERAL     CORONARY STENT INTERVENTION N/A 05/08/2020   Procedure:  CORONARY STENT INTERVENTION;  Surgeon: Swaziland, Peter M, MD;  Location: [facility] INVASIVE CV LAB;  Service: Cardiovascular;  Laterality: N/A;   LEFT HEART CATH AND CORONARY ANGIOGRAPHY N/A 05/08/2020   Procedure: LEFT HEART CATH AND CORONARY ANGIOGRAPHY;  Surgeon: Swaziland, Peter M, MD;  Location: [facility] INVASIVE CV LAB;  Service: Cardiovascular;  Laterality: N/A;   MASS EXCISION  06/10/2011   Procedure: EXCISION MASS;  Surgeon: Arley JONELLE Curia, MD;  Location: [facility];  Service: Orthopedics;  Laterality: Right;  excision cyst right thumb IP, debridement IP right thumb   RIGHT/LEFT HEART CATH AND CORONARY ANGIOGRAPHY N/A 12/10/2020   Procedure: RIGHT/LEFT HEART CATH AND CORONARY ANGIOGRAPHY;  Surgeon: Burnard Debby LABOR, MD;  Location: [facility] INVASIVE CV LAB;  Service: Cardiovascular;  Laterality: N/A;   TONSILLECTOMY     TOTAL HIP ARTHROPLASTY Right 05/02/2020   Procedure: TOTAL HIP ARTHROPLASTY ANTERIOR APPROACH;  Surgeon: Fidel Rogue, MD;  Location: [facility] ORS;  Service: Orthopedics;  Laterality: Right;   Family History Family History  Problem Relation Age of Onset   Colon polyps Mother    Cancer Father        bladder   Heart disease Father     Social History Social History   Tobacco Use   Smoking status: Former    Current packs/day: 0.00    Types: Cigarettes    Quit date: 06/05/1984    Years since quitting: 39.9   Smokeless tobacco: Never  Vaping Use   Vaping status: Never Used  Substance Use Topics   Alcohol  use: No   Drug use: No   Allergies Imdur  [isosorbide  nitrate], Alendronate  sodium, Doxycycline , Eucalyptus oil, Linaclotide , Medrol  [methylprednisolone ], Penicillamine, Penicillins, Prednisone, Risedronate, Statins, Statins support [a-g pro], Statins support [acid blockers support], Sulfa antibiotics, Sulfonamide derivatives, Tramadol, Latex, and Tape  Review of Systems A thorough review of systems was obtained and all systems are negative except as noted in the HPI and PMH.   Physical Exam Vital Signs  I have reviewed the triage vital signs BP (!) 157/67 (BP Location: Left Arm)   Pulse 68   Temp 97.6 F (36.4 C) (Oral)   Resp 19   Ht 5' 3 (1.6 m)   Wt 67.6 kg   SpO2 99%   BMI 26.39 kg/m  Physical Exam Vitals and nursing note reviewed.  Constitutional:      General: She is not in acute distress.    Appearance: Normal appearance.  HENT:     Head: Normocephalic and atraumatic.     Right Ear: External ear normal.     Left Ear: External ear normal.     Nose: Nose normal.     Mouth/Throat:     Mouth: Mucous membranes are moist.  Eyes:     General: No scleral icterus.       Right eye: No discharge.        Left eye: No discharge.  Cardiovascular:     Rate and Rhythm: Normal rate and regular rhythm.     Pulses: Normal pulses.     Heart sounds: Normal heart sounds.  Pulmonary:     Effort: Pulmonary effort is normal. No respiratory distress.     Breath sounds: Normal breath sounds. No stridor.  Abdominal:     General: Abdomen is flat. There is no distension.     Palpations: Abdomen is soft.     Tenderness: There is no abdominal tenderness. There is no guarding or rebound.  Musculoskeletal:     Cervical  back: No rigidity.     Right lower leg: No edema.     Left lower leg: No edema.  Skin:    General: Skin is warm and dry.     Capillary Refill: Capillary refill takes less than 2 seconds.  Neurological:     Mental Status: She is alert and oriented to person, place, and time.     GCS: GCS eye subscore is 4. GCS verbal subscore is 5. GCS motor  subscore is 6.  Psychiatric:        Mood and Affect: Mood normal.        Behavior: Behavior normal. Behavior is cooperative.     ED Results and Treatments Labs (all labs ordered are listed, but only abnormal results are displayed) Labs Reviewed  COMPREHENSIVE METABOLIC PANEL WITH GFR  CBC WITH DIFFERENTIAL/PLATELET  URINALYSIS, ROUTINE W REFLEX MICROSCOPIC  TROPONIN I (HIGH SENSITIVITY)                                                                                                                          Radiology No results found.  Pertinent labs & imaging results that were available during my care of the patient were reviewed by me and considered in my medical decision making (see MDM for details).  Medications Ordered in ED Medications - No data to display                                                                                                                                   Procedures Procedures  (including critical care time)  Medical Decision Making / ED Course    Medical Decision Making:    BEULA JOYNER is a 89 y.o. female with past medical history as below, significant for CAD, HFrEF, renal insufficiency, mitral regurgitation, arthritis who presents to the ED with complaint of chest discomfort, abdominal discomfort. The complaint involves an extensive differential diagnosis and also carries with it a high risk of complications and morbidity.  Serious etiology was considered. Ddx includes but is not limited to: Differential includes all life-threatening causes for chest pain. This includes but is not exclusive to acute coronary syndrome, aortic dissection, pulmonary embolism, cardiac tamponade, community-acquired pneumonia, pericarditis, musculoskeletal chest wall pain, cystitis, nephrolithiasis, ovarian cyst, sciatica, etc.   Complete initial physical exam performed, notably the patient was in nad, symptoms resolved.    Reviewed and confirmed nursing  documentation  for past medical history, family history, social history.  Vital signs reviewed.    Warm feeling across torso/abdomen > - Symptoms transient, have since resolved.  Will check troponins and labs, EKG, chest x-ray.  Transient pelvic/abdominal pain> - Similar symptoms intermittently in the past secondary to a fall.  No recent falls in the past 2 weeks.  Symptoms have also essentially resolved - check CT abdomen pelvis  Sciatica > - Patient with some discomfort to her right buttock that radiates down the back of her leg intermittently.  Consistent with sciatica.  She is TTP at the piriformis muscle.  LE NVI.  No lumbar pain.  Continue to follow-up with orthopedics       ***               Additional history obtained: -Additional history obtained from {wsadditionalhistorian:28072} -External records from outside source obtained and reviewed including: Chart review including previous notes, labs, imaging, consultation notes including  ***   Lab Tests: -I ordered, reviewed, and interpreted labs.   The pertinent results include:   Labs Reviewed  COMPREHENSIVE METABOLIC PANEL WITH GFR  CBC WITH DIFFERENTIAL/PLATELET  URINALYSIS, ROUTINE W REFLEX MICROSCOPIC  TROPONIN I (HIGH SENSITIVITY)    Notable for ***  EKG   EKG Interpretation Date/Time:  Wednesday May 04 2024 09:33:06 EDT Ventricular Rate:  83 PR Interval:  276 QRS Duration:  158 QT Interval:  423 QTC Calculation: 498 R Axis:   -59  Text Interpretation: Sinus rhythm Prolonged PR interval Left bundle branch block Confirmed by Elnor Savant ([id number]) on 05/04/2024 9:36:14 AM         Imaging Studies ordered: I ordered imaging studies including *** I independently visualized the following imaging with scope of interpretation limited to determining acute life threatening conditions related to emergency care; findings noted above I agree with the radiologist interpretation If any imaging was  obtained with contrast I closely monitored patient for any possible adverse reaction a/w contrast administration in the emergency department   Medicines ordered and prescription drug management: No orders of the defined types were placed in this encounter.   -I have reviewed the patients home medicines and have made adjustments as needed   Consultations Obtained: I requested consultation with the ***,  and discussed lab and imaging findings as well as pertinent plan - they recommend: ***   Cardiac Monitoring: The patient was maintained on a cardiac monitor.  I personally viewed and interpreted the cardiac monitored which showed an underlying rhythm of: *** Continuous pulse oximetry interpreted by myself, ***% on ***.    Social Determinants of Health:  Diagnosis or treatment significantly limited by social determinants of health: {wssoc:28071}   Reevaluation: After the interventions noted above, I reevaluated the patient and found that they have {resolved/improved/worsened:23923::improved}  Co morbidities that complicate the patient evaluation  Past Medical History:  Diagnosis Date   Anal or rectal pain    Anemia    Arthritis    CAD (coronary artery disease)    a. NSTEMI 04/2020 s/p DES to [facility] and D1.   Depression    Diverticulosis of colon (without mention of hemorrhage)    HFrEF (heart failure with reduced ejection fraction) (HCC)    Echo 5/22: EF 25-30 // Echocardiogram 10/22: EF 40-45, mild LVH, Gr 1 DD, inf and inf-sept HK, normal RVSF, mild LAE, mild MR   Hypertension    Hypoalbuminemia    Internal hemorrhoids without mention of complication    Ischemic heart disease  LBBB (left bundle branch block)    Osteopenia    Renal insufficiency    S/P right hip fracture    Slow transit constipation       Dispostion: Disposition decision including need for hospitalization was considered, and patient {wsdispo:28070::discharged from emergency  department.}    Final Clinical Impression(s) / ED Diagnoses Final diagnoses:  None

## 2024-05-04 NOTE — ED Notes (Signed)
 PT assisted to the rest room at this time.  PT a stand by assist.

## 2024-05-04 NOTE — ED Notes (Signed)
 Labs and urine sent.  Breathing is even and unlabored.  Call light within reach, encouraged to use when needs arise.

## 2024-05-04 NOTE — ED Triage Notes (Signed)
 PT to ED from home with co a heat sensation in her neck that radiates down left arm.  PT denies sob and chest pain. PT also experiencing Left abd pain upon palpation.  PT has a history of MI and stents. This started around 0200 this morning.  PT had an MI 4 years in October of 2021.

## 2024-05-06 ENCOUNTER — Other Ambulatory Visit

## 2024-05-11 ENCOUNTER — Other Ambulatory Visit: Payer: Self-pay | Admitting: Physical Medicine and Rehabilitation

## 2024-05-11 DIAGNOSIS — K7689 Other specified diseases of liver: Secondary | ICD-10-CM

## 2024-06-11 ENCOUNTER — Ambulatory Visit
Admission: RE | Admit: 2024-06-11 | Discharge: 2024-06-11 | Disposition: A | Discharge status: Home / Self Care | Source: Ambulatory Visit | Attending: Physical Medicine and Rehabilitation | Admitting: Physical Medicine and Rehabilitation

## 2024-06-11 DIAGNOSIS — K7689 Other specified diseases of liver: Secondary | ICD-10-CM

## 2024-06-11 MED ORDER — GADOPICLENOL 0.5 MMOL/ML IV SOLN
5.0000 mL | Freq: Once | INTRAVENOUS | Status: AC | PRN
  Administered 2024-06-11: 5 mL via INTRAVENOUS

## 2024-06-29 ENCOUNTER — Other Ambulatory Visit: Payer: Self-pay | Admitting: Cardiology

## 2024-06-29 DIAGNOSIS — I5042 Chronic combined systolic (congestive) and diastolic (congestive) heart failure: Secondary | ICD-10-CM

## 2024-06-29 DIAGNOSIS — Z79899 Other long term (current) drug therapy: Secondary | ICD-10-CM

## 2024-06-29 DIAGNOSIS — E785 Hyperlipidemia, unspecified: Secondary | ICD-10-CM

## 2024-06-29 DIAGNOSIS — I2510 Atherosclerotic heart disease of native coronary artery without angina pectoris: Secondary | ICD-10-CM

## 2024-07-28 LAB — LAB REPORT - SCANNED: EGFR: 34

## 2024-08-01 ENCOUNTER — Telehealth: Payer: Self-pay | Admitting: Cardiology

## 2024-08-01 NOTE — Telephone Encounter (Signed)
 Pt c/o medication issue:  1. Name of Medication:   furosemide  (LASIX ) 20 MG tablet  spironolactone  (ALDACTONE ) 25 MG tablet  pantoprazole  (PROTONIX ) 40 MG tablet   2. How are you currently taking this medication (dosage and times per day)?   3. Are you having a reaction (difficulty breathing--STAT)?   4. What is your medication issue?   Caller Giles) stated patient has stopped taking the Pantoprazole .  Caller stated patient also wants to make medication changes to her Spirolactone and Lasix  due to her recent lab results and wants to start Torsemide instead.  Caller stated they will be faxing patient's latest lab results to Dr. Jordan. Caller stated can call patient directly.

## 2024-08-04 ENCOUNTER — Telehealth: Payer: Self-pay | Admitting: Pharmacist

## 2024-08-04 NOTE — Addendum Note (Signed)
 Addended by: Liela Rylee D on: 08/04/2024 10:04 AM   Modules accepted: Orders

## 2024-08-04 NOTE — Telephone Encounter (Signed)
 I was on the phone with patient discussing her Repatha  when she asked about this message.  I gave her Dr. Gib message.  She said she will keep everything the same for now and will call and make her follow-up appointment with Dr. Jordan and discuss this more at the appointment.

## 2024-08-04 NOTE — Telephone Encounter (Signed)
 Called to set up repeat labs for patient after starting Repatha .  Patient reports that she never started Repatha  as it was too expensive.  I have tried to warn patient about this at her visit.  Patient states that she is almost 89 years old and does not want to take anything she really does not have to.  We talked about how Leqvio would be covered under her supplement but patient just really does not want to take any cholesterol medication.  Advised that at her age I can understand this.

## 2024-08-09 ENCOUNTER — Other Ambulatory Visit: Payer: Self-pay | Admitting: Cardiology

## 2024-08-09 ENCOUNTER — Other Ambulatory Visit: Payer: Self-pay

## 2024-08-09 DIAGNOSIS — E785 Hyperlipidemia, unspecified: Secondary | ICD-10-CM

## 2024-08-09 DIAGNOSIS — I5042 Chronic combined systolic (congestive) and diastolic (congestive) heart failure: Secondary | ICD-10-CM

## 2024-08-09 DIAGNOSIS — I2510 Atherosclerotic heart disease of native coronary artery without angina pectoris: Secondary | ICD-10-CM

## 2024-08-09 DIAGNOSIS — Z79899 Other long term (current) drug therapy: Secondary | ICD-10-CM

## 2024-08-09 MED ORDER — CLOPIDOGREL BISULFATE 75 MG PO TABS: 75.0000 mg | ORAL_TABLET | Freq: Every day | ORAL | 3 refills | Status: AC

## 2024-08-09 MED ORDER — FUROSEMIDE 20 MG PO TABS: 20.0000 mg | ORAL_TABLET | Freq: Every day | ORAL | 1 refills | Status: AC

## 2024-08-10 NOTE — Telephone Encounter (Signed)
 Spoke to patient follow up appointment scheduled with Dr.Jordan 2/11 at 3:00 pm.Patient was tearful stating she is trying to eat foods good for kidneys.Stated her [facility] RN wanted her to ask Dr.Jordan if she could change Furosemide  to Torsemide.She told her Torsemide better for kidneys.Patient stated she stopped Pantoprazole  she read that was bad for her kidneys. Advised I will speak to Dr.Jordan tomorrow when he is back in office.Dr.Jordan did advise ok to stop Aldactone .Repeat bmet in 2 to 3 weeks.Lab order mailed.

## 2024-08-12 NOTE — Telephone Encounter (Signed)
 Spoke to patient Dr.Jordan advised kidney functions are stable.Continue Furosemide .She has stopped Aldactone  and will have bmet in 2 weeks.

## 2024-08-18 ENCOUNTER — Telehealth: Payer: Self-pay | Admitting: Cardiology

## 2024-08-18 NOTE — Telephone Encounter (Signed)
Patient wants a call back directly from RN Cheryl. 

## 2024-08-23 NOTE — Telephone Encounter (Signed)
 Spoke to patient she stated she was unable to have fasting lab work done due to bad weather.She will have done before her appointment with Dr.Jordan 2/11 at 3:00 pm.Advised to be safe.

## 2024-08-31 ENCOUNTER — Ambulatory Visit: Admitting: Cardiology
